# Patient Record
Sex: Female | Born: 1935
Health system: Southern US, Community
[De-identification: ages and names within clinical notes are randomized; demographics above are authoritative.]

## PROBLEM LIST (undated history)

## (undated) DIAGNOSIS — R112 Nausea with vomiting, unspecified: Secondary | ICD-10-CM

## (undated) DIAGNOSIS — E872 Acidosis: Secondary | ICD-10-CM

## (undated) DIAGNOSIS — M1990 Unspecified osteoarthritis, unspecified site: Secondary | ICD-10-CM

## (undated) DIAGNOSIS — N170 Acute kidney failure with tubular necrosis: Secondary | ICD-10-CM

## (undated) DIAGNOSIS — R011 Cardiac murmur, unspecified: Secondary | ICD-10-CM

## (undated) DIAGNOSIS — D649 Anemia, unspecified: Secondary | ICD-10-CM

## (undated) DIAGNOSIS — Z9889 Other specified postprocedural states: Secondary | ICD-10-CM

## (undated) DIAGNOSIS — N390 Urinary tract infection, site not specified: Secondary | ICD-10-CM

## (undated) DIAGNOSIS — R6520 Severe sepsis without septic shock: Secondary | ICD-10-CM

## (undated) DIAGNOSIS — I10 Essential (primary) hypertension: Secondary | ICD-10-CM

## (undated) DIAGNOSIS — N179 Acute kidney failure, unspecified: Secondary | ICD-10-CM

## (undated) DIAGNOSIS — R7303 Prediabetes: Secondary | ICD-10-CM

## (undated) DIAGNOSIS — Z95828 Presence of other vascular implants and grafts: Secondary | ICD-10-CM

## (undated) DIAGNOSIS — A419 Sepsis, unspecified organism: Secondary | ICD-10-CM

## (undated) DIAGNOSIS — H269 Unspecified cataract: Secondary | ICD-10-CM

## (undated) DIAGNOSIS — A401 Sepsis due to streptococcus, group B: Secondary | ICD-10-CM

## (undated) HISTORY — DX: Acute kidney failure with tubular necrosis: N17.0

## (undated) HISTORY — DX: Severe sepsis without septic shock: R65.20

## (undated) HISTORY — DX: Acidosis: E87.2

## (undated) HISTORY — PX: APPENDECTOMY: SHX54

## (undated) HISTORY — PX: RECTOCELE REPAIR: SHX761

## (undated) HISTORY — DX: Unspecified cataract: H26.9

## (undated) HISTORY — PX: ABDOMINAL HYSTERECTOMY: SHX81

## (undated) HISTORY — DX: Urinary tract infection, site not specified: N39.0

## (undated) HISTORY — DX: Cardiac murmur, unspecified: R01.1

## (undated) HISTORY — DX: Sepsis due to Streptococcus, group B: A40.1

## (undated) HISTORY — PX: EYE SURGERY: SHX253

## (undated) HISTORY — DX: Unspecified osteoarthritis, unspecified site: M19.90

## (undated) HISTORY — PX: JOINT REPLACEMENT: SHX530

## (undated) HISTORY — DX: Presence of other vascular implants and grafts: Z95.828

## (undated) HISTORY — DX: Acute kidney failure, unspecified: N17.9

## (undated) HISTORY — DX: Sepsis, unspecified organism: A41.9

---

## 2000-06-05 ENCOUNTER — Emergency Department (HOSPITAL_COMMUNITY): Admission: EM | Admit: 2000-06-05 | Discharge: 2000-06-05 | Payer: Self-pay | Admitting: Emergency Medicine

## 2008-06-14 ENCOUNTER — Emergency Department: Payer: Self-pay | Admitting: Emergency Medicine

## 2010-04-22 ENCOUNTER — Ambulatory Visit: Payer: Self-pay | Admitting: Ophthalmology

## 2010-05-10 ENCOUNTER — Ambulatory Visit: Payer: Self-pay | Admitting: Ophthalmology

## 2010-07-06 ENCOUNTER — Ambulatory Visit: Payer: Self-pay | Admitting: Ophthalmology

## 2010-07-18 ENCOUNTER — Ambulatory Visit: Payer: Self-pay | Admitting: Ophthalmology

## 2010-12-19 ENCOUNTER — Ambulatory Visit: Payer: Self-pay | Admitting: General Practice

## 2011-01-04 ENCOUNTER — Inpatient Hospital Stay: Payer: Self-pay | Admitting: General Practice

## 2011-01-06 DIAGNOSIS — R7989 Other specified abnormal findings of blood chemistry: Secondary | ICD-10-CM

## 2011-01-06 DIAGNOSIS — R000 Tachycardia, unspecified: Secondary | ICD-10-CM

## 2011-01-06 DIAGNOSIS — I059 Rheumatic mitral valve disease, unspecified: Secondary | ICD-10-CM

## 2011-01-07 DIAGNOSIS — I2699 Other pulmonary embolism without acute cor pulmonale: Secondary | ICD-10-CM

## 2011-01-13 ENCOUNTER — Emergency Department (HOSPITAL_COMMUNITY)
Admission: EM | Admit: 2011-01-13 | Discharge: 2011-01-13 | Disposition: A | Discharge status: Home / Self Care | Payer: Medicare Other | Attending: Emergency Medicine | Admitting: Emergency Medicine

## 2011-01-13 DIAGNOSIS — D62 Acute posthemorrhagic anemia: Secondary | ICD-10-CM | POA: Insufficient documentation

## 2011-01-13 DIAGNOSIS — M129 Arthropathy, unspecified: Secondary | ICD-10-CM | POA: Insufficient documentation

## 2011-01-13 DIAGNOSIS — I10 Essential (primary) hypertension: Secondary | ICD-10-CM | POA: Insufficient documentation

## 2011-01-13 DIAGNOSIS — Z9889 Other specified postprocedural states: Secondary | ICD-10-CM | POA: Insufficient documentation

## 2011-01-13 DIAGNOSIS — Z86711 Personal history of pulmonary embolism: Secondary | ICD-10-CM | POA: Insufficient documentation

## 2011-01-13 LAB — DIFFERENTIAL
Basophils Relative: 0 % (ref 0–1)
Lymphs Abs: 1.8 10*3/uL (ref 0.7–4.0)
Monocytes Relative: 10 % (ref 3–12)
Neutro Abs: 7 10*3/uL (ref 1.7–7.7)
Neutrophils Relative %: 70 % (ref 43–77)

## 2011-01-13 LAB — BASIC METABOLIC PANEL
CO2: 27 mEq/L (ref 19–32)
Calcium: 8.2 mg/dL — ABNORMAL LOW (ref 8.4–10.5)
GFR calc Af Amer: >60 mL/min (ref >60)
GFR calc non Af Amer: >60 mL/min (ref >60)
Sodium: 135 mEq/L (ref 135–145)

## 2011-01-13 LAB — URINALYSIS, ROUTINE W REFLEX MICROSCOPIC
Hgb urine dipstick: NEGATIVE
Nitrite: NEGATIVE
Specific Gravity, Urine: 1.012 (ref 1.005–1.030)
Urobilinogen, UA: 0.2 mg/dL (ref 0.0–1.0)

## 2011-01-13 LAB — CBC
Hemoglobin: 7.4 g/dL — ABNORMAL LOW (ref 12.0–15.0)
MCH: 28.8 pg (ref 26.0–34.0)
RBC: 2.57 MIL/uL — ABNORMAL LOW (ref 3.87–5.11)

## 2011-01-13 LAB — PROTIME-INR: Prothrombin Time: 22.1 seconds — ABNORMAL HIGH (ref 11.6–15.2)

## 2011-01-13 LAB — URINE MICROSCOPIC-ADD ON

## 2011-01-15 LAB — URINE CULTURE
Colony Count: NO GROWTH
Culture  Setup Time: 201203240046
Culture: NO GROWTH

## 2011-01-23 ENCOUNTER — Ambulatory Visit: Payer: Medicare Other | Attending: Orthopedic Surgery | Admitting: Physical Therapy

## 2011-01-23 DIAGNOSIS — Z96659 Presence of unspecified artificial knee joint: Secondary | ICD-10-CM | POA: Insufficient documentation

## 2011-01-23 DIAGNOSIS — M25669 Stiffness of unspecified knee, not elsewhere classified: Secondary | ICD-10-CM | POA: Insufficient documentation

## 2011-01-23 DIAGNOSIS — IMO0001 Reserved for inherently not codable concepts without codable children: Secondary | ICD-10-CM | POA: Insufficient documentation

## 2011-01-23 DIAGNOSIS — M25569 Pain in unspecified knee: Secondary | ICD-10-CM | POA: Insufficient documentation

## 2011-01-25 ENCOUNTER — Ambulatory Visit: Payer: Medicare Other | Admitting: Rehabilitation

## 2011-01-31 ENCOUNTER — Ambulatory Visit: Payer: Medicare Other | Admitting: Physical Therapy

## 2011-02-02 ENCOUNTER — Ambulatory Visit: Payer: Medicare Other | Admitting: Physical Therapy

## 2011-02-07 ENCOUNTER — Ambulatory Visit: Payer: Medicare Other | Admitting: Physical Therapy

## 2011-02-09 ENCOUNTER — Ambulatory Visit: Payer: Medicare Other | Admitting: Physical Therapy

## 2011-02-14 ENCOUNTER — Ambulatory Visit: Payer: Medicare Other | Admitting: Physical Therapy

## 2011-02-16 ENCOUNTER — Ambulatory Visit: Payer: Medicare Other | Admitting: Physical Therapy

## 2011-02-21 ENCOUNTER — Ambulatory Visit: Payer: Medicare Other | Attending: Orthopedic Surgery | Admitting: Physical Therapy

## 2011-02-21 DIAGNOSIS — M25569 Pain in unspecified knee: Secondary | ICD-10-CM | POA: Insufficient documentation

## 2011-02-21 DIAGNOSIS — IMO0001 Reserved for inherently not codable concepts without codable children: Secondary | ICD-10-CM | POA: Insufficient documentation

## 2011-02-21 DIAGNOSIS — M25669 Stiffness of unspecified knee, not elsewhere classified: Secondary | ICD-10-CM | POA: Insufficient documentation

## 2011-02-21 DIAGNOSIS — Z96659 Presence of unspecified artificial knee joint: Secondary | ICD-10-CM | POA: Insufficient documentation

## 2011-02-28 ENCOUNTER — Ambulatory Visit: Payer: Medicare Other | Admitting: Physical Therapy

## 2011-03-02 ENCOUNTER — Ambulatory Visit: Payer: Medicare Other | Admitting: Physical Therapy

## 2011-03-07 ENCOUNTER — Ambulatory Visit: Payer: Medicare Other | Admitting: Physical Therapy

## 2011-03-09 ENCOUNTER — Ambulatory Visit: Payer: Medicare Other | Admitting: Physical Therapy

## 2011-03-14 ENCOUNTER — Ambulatory Visit: Payer: Medicare Other | Admitting: Physical Therapy

## 2011-03-16 ENCOUNTER — Ambulatory Visit: Payer: Medicare Other | Admitting: Physical Therapy

## 2011-03-21 ENCOUNTER — Ambulatory Visit: Payer: Medicare Other | Admitting: Physical Therapy

## 2011-03-23 ENCOUNTER — Ambulatory Visit: Payer: Medicare Other | Admitting: Physical Therapy

## 2011-10-20 ENCOUNTER — Emergency Department: Payer: Self-pay | Admitting: Emergency Medicine

## 2012-02-15 ENCOUNTER — Emergency Department: Payer: Self-pay | Admitting: Internal Medicine

## 2012-02-15 DIAGNOSIS — S0003XA Contusion of scalp, initial encounter: Secondary | ICD-10-CM | POA: Diagnosis not present

## 2012-02-15 DIAGNOSIS — S0083XA Contusion of other part of head, initial encounter: Secondary | ICD-10-CM | POA: Diagnosis not present

## 2012-02-15 DIAGNOSIS — S0990XA Unspecified injury of head, initial encounter: Secondary | ICD-10-CM | POA: Diagnosis not present

## 2012-02-15 DIAGNOSIS — Z7982 Long term (current) use of aspirin: Secondary | ICD-10-CM | POA: Diagnosis not present

## 2012-02-15 DIAGNOSIS — T148XXA Other injury of unspecified body region, initial encounter: Secondary | ICD-10-CM | POA: Diagnosis not present

## 2012-02-15 DIAGNOSIS — Z90710 Acquired absence of both cervix and uterus: Secondary | ICD-10-CM | POA: Diagnosis not present

## 2012-02-15 DIAGNOSIS — S0180XA Unspecified open wound of other part of head, initial encounter: Secondary | ICD-10-CM | POA: Diagnosis not present

## 2012-02-15 DIAGNOSIS — M25569 Pain in unspecified knee: Secondary | ICD-10-CM | POA: Diagnosis not present

## 2012-02-22 ENCOUNTER — Emergency Department: Payer: Self-pay | Admitting: Internal Medicine

## 2012-03-08 DIAGNOSIS — H2510 Age-related nuclear cataract, unspecified eye: Secondary | ICD-10-CM | POA: Diagnosis not present

## 2012-03-08 DIAGNOSIS — Z961 Presence of intraocular lens: Secondary | ICD-10-CM | POA: Diagnosis not present

## 2012-07-26 ENCOUNTER — Emergency Department: Payer: Self-pay | Admitting: Emergency Medicine

## 2012-09-05 DIAGNOSIS — E119 Type 2 diabetes mellitus without complications: Secondary | ICD-10-CM | POA: Diagnosis not present

## 2012-09-05 DIAGNOSIS — I10 Essential (primary) hypertension: Secondary | ICD-10-CM | POA: Diagnosis not present

## 2013-01-11 ENCOUNTER — Ambulatory Visit: Payer: Medicare Other

## 2013-01-11 ENCOUNTER — Ambulatory Visit (INDEPENDENT_AMBULATORY_CARE_PROVIDER_SITE_OTHER): Payer: Medicare Other | Admitting: Family Medicine

## 2013-01-11 VITALS — BP 178/66 | HR 69 | Temp 98.9°F | Resp 16 | Ht 59.75 in | Wt 175.8 lb

## 2013-01-11 DIAGNOSIS — R05 Cough: Secondary | ICD-10-CM

## 2013-01-11 DIAGNOSIS — I517 Cardiomegaly: Secondary | ICD-10-CM

## 2013-01-11 DIAGNOSIS — R059 Cough, unspecified: Secondary | ICD-10-CM

## 2013-01-11 DIAGNOSIS — R062 Wheezing: Secondary | ICD-10-CM

## 2013-01-11 DIAGNOSIS — R351 Nocturia: Secondary | ICD-10-CM

## 2013-01-11 LAB — POCT CBC
HCT, POC: 38.6 % (ref 37.7–47.9)
Hemoglobin: 12 g/dL — AB (ref 12.2–16.2)
Lymph, poc: 2.6 (ref 0.6–3.4)
MCH, POC: 28.5 pg (ref 27–31.2)
MCHC: 31.1 g/dL — AB (ref 31.8–35.4)
RBC: 4.21 M/uL (ref 4.04–5.48)
WBC: 10.5 10*3/uL — AB (ref 4.6–10.2)

## 2013-01-11 LAB — GLUCOSE, POCT (MANUAL RESULT ENTRY): POC Glucose: 108 mg/dl — AB (ref 70–99)

## 2013-01-11 MED ORDER — PROMETHAZINE HCL 12.5 MG PO TABS: 12.5000 mg | ORAL_TABLET | Freq: Three times a day (TID) | ORAL | Status: DC | PRN

## 2013-01-11 MED ORDER — CEFDINIR 300 MG PO CAPS: 300.0000 mg | ORAL_CAPSULE | Freq: Two times a day (BID) | ORAL | Status: DC

## 2013-01-11 MED ORDER — PREDNISONE 20 MG PO TABS: ORAL_TABLET | ORAL | Status: DC

## 2013-01-11 MED ORDER — ALBUTEROL SULFATE HFA 108 (90 BASE) MCG/ACT IN AERS: 2.0000 | INHALATION_SPRAY | Freq: Four times a day (QID) | RESPIRATORY_TRACT | Status: DC | PRN

## 2013-01-11 NOTE — Patient Instructions (Addendum)
Drink plenty of fluids  Take medications as ordered  Return in 5-7 days to get your labs rechecked here or at your primary care

## 2013-01-11 NOTE — Progress Notes (Signed)
Subjective: 79 year old lady who is here the cause of a cough. This started with a migraine headache the day before the coughing started. She is continued to cough a lot day and night. She is not coughing up much stuff. She has been wheezing. She does not smoke. She lives alone. She does not go to the doctor very often she does have her regular physician. Has not been seeing any physician regularly although she does have one. She is functional, lives alone except for driving. Been having chest pain or edema. He has been told that she was a borderline diabetic.  Objective: Pleasant elderly alert lady in no major distress but she does cough. Her TMs are normal on the right, moderate cerumen on the left. Throat is clear. Neck soft without. Chest has scattered rhonchi and wheezes, more on the right than the left. Blood pressure elevation is noted. Repeat showed 178/66 no ankle edema  Assessment: Asthmatic bronchitis Hypertension  Plan: Chest x-ray and CBC  [facility] reading (PRIMARY) by  Dr. Alwyn Ren Cardiomegaly   Results for orders placed in visit on 01/11/13  POCT CBC      Result Value Range   WBC 10.5 (*) 4.6 - 10.2 K/uL   Lymph, poc 2.6  0.6 - 3.4   POC LYMPH PERCENT 24.5  10 - 50 %L   MID (cbc) 0.7  0 - 0.9   POC MID % 6.7  0 - 12 %M   POC Granulocyte 7.2 (*) 2 - 6.9   Granulocyte percent 68.8  37 - 80 %G   RBC 4.21  4.04 - 5.48 M/uL   Hemoglobin 12.0 (*) 12.2 - 16.2 g/dL   HCT, POC 03.4  74.2 - 47.9 %   MCV 91.8  80 - 97 fL   MCH, POC 28.5  27 - 31.2 pg   MCHC 31.1 (*) 31.8 - 35.4 g/dL   RDW, POC 59.5     Platelet Count, POC 305  142 - 424 K/uL   MPV 9.2  0 - 99.8 fL  GLUCOSE, POCT (MANUAL RESULT ENTRY)      Result Value Range   POC Glucose 108 (*) 70 - 99 mg/dl   Assessment: Asthmatic bronchitis Hypertension unsatisfactory control Cardiomegaly without obvious CHF  Plan: Explained to her that she needs to followup with regard to the blood pressure. Also has to see a  cardiologist with regard to the cardiomegaly. She can either return here next week or 2 her primary care if the labs rechecked

## 2013-01-14 ENCOUNTER — Ambulatory Visit: Payer: Medicare Other | Admitting: Cardiovascular Disease

## 2013-01-15 ENCOUNTER — Ambulatory Visit (INDEPENDENT_AMBULATORY_CARE_PROVIDER_SITE_OTHER): Payer: Medicare Other | Admitting: Family Medicine

## 2013-01-15 VITALS — BP 150/60 | HR 67 | Temp 98.8°F | Resp 18 | Ht 59.75 in | Wt 177.8 lb

## 2013-01-15 DIAGNOSIS — J45909 Unspecified asthma, uncomplicated: Secondary | ICD-10-CM

## 2013-01-15 NOTE — Patient Instructions (Signed)
Use the inhaler on an as-needed basis.  Return if worse

## 2013-01-15 NOTE — Progress Notes (Signed)
Subjective: Patient is feeling much better. She's not congested. She's not coughing. She has a long history of early morning nasal congestion, but is no worse than usual at this time. She is not wheezing. She's able to sleep through the night we will. She still has one day of prednisone and several days of antibiotics left.  Objective: Pleasant alert lady in no major distress. TMs normal. Throat clear. Neck supple without nodes. Chest clear. Heart regular without murmurs.  Assessment: Asthmatic bronchitis, improved Cardiomegaly  Plan: She is going to try again at [facility] cardiology. The people where we referred her to were not very pleasant about rescheduling things, so she prefers to go to [place].  She is to finish out her current medications, then just use the inhaler on an as-needed basis. Return if worse.

## 2013-01-30 DIAGNOSIS — Z0389 Encounter for observation for other suspected diseases and conditions ruled out: Secondary | ICD-10-CM | POA: Diagnosis not present

## 2013-01-30 DIAGNOSIS — I10 Essential (primary) hypertension: Secondary | ICD-10-CM | POA: Diagnosis not present

## 2013-01-30 DIAGNOSIS — I517 Cardiomegaly: Secondary | ICD-10-CM | POA: Diagnosis not present

## 2013-02-14 DIAGNOSIS — Z79899 Other long term (current) drug therapy: Secondary | ICD-10-CM | POA: Diagnosis not present

## 2013-02-14 DIAGNOSIS — Z0389 Encounter for observation for other suspected diseases and conditions ruled out: Secondary | ICD-10-CM | POA: Diagnosis not present

## 2013-02-14 DIAGNOSIS — I10 Essential (primary) hypertension: Secondary | ICD-10-CM | POA: Diagnosis not present

## 2013-02-14 DIAGNOSIS — I517 Cardiomegaly: Secondary | ICD-10-CM | POA: Diagnosis not present

## 2013-03-13 DIAGNOSIS — I10 Essential (primary) hypertension: Secondary | ICD-10-CM | POA: Diagnosis not present

## 2013-03-13 DIAGNOSIS — I359 Nonrheumatic aortic valve disorder, unspecified: Secondary | ICD-10-CM | POA: Diagnosis not present

## 2013-09-11 ENCOUNTER — Ambulatory Visit: Payer: Medicare Other | Admitting: Cardiology

## 2014-01-27 ENCOUNTER — Other Ambulatory Visit: Payer: Self-pay | Admitting: Cardiology

## 2014-02-16 ENCOUNTER — Other Ambulatory Visit (HOSPITAL_COMMUNITY): Payer: Self-pay | Admitting: Radiology

## 2014-02-16 ENCOUNTER — Ambulatory Visit (HOSPITAL_COMMUNITY): Payer: Medicare Other | Attending: Internal Medicine | Admitting: Radiology

## 2014-02-16 ENCOUNTER — Encounter (INDEPENDENT_AMBULATORY_CARE_PROVIDER_SITE_OTHER): Payer: Self-pay

## 2014-02-16 ENCOUNTER — Encounter: Payer: Self-pay | Admitting: Internal Medicine

## 2014-02-16 DIAGNOSIS — R609 Edema, unspecified: Secondary | ICD-10-CM | POA: Diagnosis not present

## 2014-02-16 DIAGNOSIS — R0609 Other forms of dyspnea: Secondary | ICD-10-CM | POA: Insufficient documentation

## 2014-02-16 DIAGNOSIS — R0989 Other specified symptoms and signs involving the circulatory and respiratory systems: Secondary | ICD-10-CM | POA: Diagnosis not present

## 2014-02-16 DIAGNOSIS — R011 Cardiac murmur, unspecified: Secondary | ICD-10-CM

## 2014-02-16 NOTE — Progress Notes (Signed)
Echocardiogram performed.  

## 2014-02-17 ENCOUNTER — Other Ambulatory Visit: Payer: Self-pay | Admitting: General Surgery

## 2014-02-17 DIAGNOSIS — I351 Nonrheumatic aortic (valve) insufficiency: Secondary | ICD-10-CM

## 2014-02-23 ENCOUNTER — Other Ambulatory Visit: Payer: Self-pay | Admitting: Cardiology

## 2014-03-04 DIAGNOSIS — H35359 Cystoid macular degeneration, unspecified eye: Secondary | ICD-10-CM | POA: Diagnosis not present

## 2014-03-06 DIAGNOSIS — H43819 Vitreous degeneration, unspecified eye: Secondary | ICD-10-CM | POA: Diagnosis not present

## 2014-03-11 DIAGNOSIS — H35059 Retinal neovascularization, unspecified, unspecified eye: Secondary | ICD-10-CM | POA: Diagnosis not present

## 2014-03-22 ENCOUNTER — Emergency Department (HOSPITAL_COMMUNITY): Payer: Medicare Other

## 2014-03-22 ENCOUNTER — Inpatient Hospital Stay (HOSPITAL_COMMUNITY)
Admission: EM | Admit: 2014-03-22 | Discharge: 2014-03-26 | DRG: 871 | Disposition: A | Discharge status: Home Health | Payer: Medicare Other | Attending: Internal Medicine | Admitting: Internal Medicine

## 2014-03-22 ENCOUNTER — Encounter (HOSPITAL_COMMUNITY): Payer: Self-pay | Admitting: *Deleted

## 2014-03-22 DIAGNOSIS — A409 Streptococcal sepsis, unspecified: Principal | ICD-10-CM | POA: Diagnosis present

## 2014-03-22 DIAGNOSIS — Z79899 Other long term (current) drug therapy: Secondary | ICD-10-CM | POA: Diagnosis not present

## 2014-03-22 DIAGNOSIS — R7881 Bacteremia: Secondary | ICD-10-CM

## 2014-03-22 DIAGNOSIS — E860 Dehydration: Secondary | ICD-10-CM | POA: Diagnosis present

## 2014-03-22 DIAGNOSIS — L02419 Cutaneous abscess of limb, unspecified: Secondary | ICD-10-CM | POA: Diagnosis present

## 2014-03-22 DIAGNOSIS — R011 Cardiac murmur, unspecified: Secondary | ICD-10-CM | POA: Diagnosis present

## 2014-03-22 DIAGNOSIS — A419 Sepsis, unspecified organism: Secondary | ICD-10-CM | POA: Diagnosis not present

## 2014-03-22 DIAGNOSIS — E872 Acidosis, unspecified: Secondary | ICD-10-CM | POA: Diagnosis present

## 2014-03-22 DIAGNOSIS — R6520 Severe sepsis without septic shock: Secondary | ICD-10-CM | POA: Diagnosis present

## 2014-03-22 DIAGNOSIS — R531 Weakness: Secondary | ICD-10-CM

## 2014-03-22 DIAGNOSIS — R5383 Other fatigue: Secondary | ICD-10-CM | POA: Diagnosis present

## 2014-03-22 DIAGNOSIS — N170 Acute kidney failure with tubular necrosis: Secondary | ICD-10-CM | POA: Diagnosis present

## 2014-03-22 DIAGNOSIS — R5381 Other malaise: Secondary | ICD-10-CM | POA: Diagnosis present

## 2014-03-22 DIAGNOSIS — N390 Urinary tract infection, site not specified: Secondary | ICD-10-CM

## 2014-03-22 DIAGNOSIS — R4182 Altered mental status, unspecified: Secondary | ICD-10-CM | POA: Diagnosis present

## 2014-03-22 DIAGNOSIS — Z880 Allergy status to penicillin: Secondary | ICD-10-CM

## 2014-03-22 DIAGNOSIS — M129 Arthropathy, unspecified: Secondary | ICD-10-CM | POA: Diagnosis present

## 2014-03-22 DIAGNOSIS — N179 Acute kidney failure, unspecified: Secondary | ICD-10-CM

## 2014-03-22 DIAGNOSIS — D72829 Elevated white blood cell count, unspecified: Secondary | ICD-10-CM

## 2014-03-22 DIAGNOSIS — L03115 Cellulitis of right lower limb: Secondary | ICD-10-CM

## 2014-03-22 DIAGNOSIS — E876 Hypokalemia: Secondary | ICD-10-CM | POA: Diagnosis present

## 2014-03-22 DIAGNOSIS — L03119 Cellulitis of unspecified part of limb: Secondary | ICD-10-CM

## 2014-03-22 DIAGNOSIS — F05 Delirium due to known physiological condition: Secondary | ICD-10-CM | POA: Diagnosis present

## 2014-03-22 DIAGNOSIS — Z7982 Long term (current) use of aspirin: Secondary | ICD-10-CM | POA: Diagnosis not present

## 2014-03-22 DIAGNOSIS — M25569 Pain in unspecified knee: Secondary | ICD-10-CM | POA: Diagnosis not present

## 2014-03-22 DIAGNOSIS — D649 Anemia, unspecified: Secondary | ICD-10-CM | POA: Diagnosis present

## 2014-03-22 DIAGNOSIS — R509 Fever, unspecified: Secondary | ICD-10-CM

## 2014-03-22 DIAGNOSIS — R6889 Other general symptoms and signs: Secondary | ICD-10-CM | POA: Diagnosis not present

## 2014-03-22 HISTORY — DX: Acidosis: E87.2

## 2014-03-22 HISTORY — DX: Sepsis, unspecified organism: A41.9

## 2014-03-22 HISTORY — DX: Acute kidney failure, unspecified: N17.9

## 2014-03-22 HISTORY — DX: Urinary tract infection, site not specified: N39.0

## 2014-03-22 HISTORY — DX: Acute kidney failure with tubular necrosis: N17.0

## 2014-03-22 HISTORY — DX: Acidosis, unspecified: E87.20

## 2014-03-22 LAB — CK TOTAL AND CKMB (NOT AT ARMC)
CK, MB: 3.3 ng/mL (ref 0.3–4.0)
Relative Index: 0.4 (ref 0.0–2.5)
Total CK: 881 U/L — ABNORMAL HIGH (ref 7–177)

## 2014-03-22 LAB — CBC
HCT: 33.3 % — ABNORMAL LOW (ref 36.0–46.0)
HEMOGLOBIN: 11.4 g/dL — AB (ref 12.0–15.0)
MCH: 30.2 pg (ref 26.0–34.0)
MCHC: 34.2 g/dL (ref 30.0–36.0)
MCV: 88.1 fL (ref 78.0–100.0)
Platelets: 199 10*3/uL (ref 150–400)
RBC: 3.78 MIL/uL — ABNORMAL LOW (ref 3.87–5.11)
RDW: 13.8 % (ref 11.5–15.5)
WBC: 22.4 10*3/uL — ABNORMAL HIGH (ref 4.0–10.5)

## 2014-03-22 LAB — COMPREHENSIVE METABOLIC PANEL
ALT: 28 U/L (ref 0–35)
AST: 45 U/L — ABNORMAL HIGH (ref 0–37)
Albumin: 3.4 g/dL — ABNORMAL LOW (ref 3.5–5.2)
Alkaline Phosphatase: 55 U/L (ref 39–117)
BUN: 35 mg/dL — ABNORMAL HIGH (ref 6–23)
CO2: 19 mEq/L (ref 19–32)
CREATININE: 1.22 mg/dL — AB (ref 0.50–1.10)
Calcium: 8.7 mg/dL (ref 8.4–10.5)
Chloride: 95 mEq/L — ABNORMAL LOW (ref 96–112)
GFR calc non Af Amer: 40 mL/min — ABNORMAL LOW (ref >90)
GFR, EST AFRICAN AMERICAN: 46 mL/min — AB (ref >90)
GLUCOSE: 181 mg/dL — AB (ref 70–99)
Potassium: 3.5 mEq/L — ABNORMAL LOW (ref 3.7–5.3)
Sodium: 132 mEq/L — ABNORMAL LOW (ref 137–147)
Total Bilirubin: 0.5 mg/dL (ref 0.3–1.2)
Total Protein: 7 g/dL (ref 6.0–8.3)

## 2014-03-22 LAB — URINE MICROSCOPIC-ADD ON

## 2014-03-22 LAB — URINALYSIS, ROUTINE W REFLEX MICROSCOPIC
Glucose, UA: NEGATIVE mg/dL
KETONES UR: NEGATIVE mg/dL
NITRITE: NEGATIVE
PH: 5 (ref 5.0–8.0)
PROTEIN: 100 mg/dL — AB
Specific Gravity, Urine: 1.024 (ref 1.005–1.030)
Urobilinogen, UA: 0.2 mg/dL (ref 0.0–1.0)

## 2014-03-22 LAB — I-STAT CG4 LACTIC ACID, ED: LACTIC ACID, VENOUS: 4.81 mmol/L — AB (ref 0.5–2.2)

## 2014-03-22 MED ORDER — SODIUM CHLORIDE 0.9 % IJ SOLN
3.0000 mL | Freq: Two times a day (BID) | INTRAMUSCULAR | Status: DC
  Administered 2014-03-23 – 2014-03-26 (×5): 3 mL via INTRAVENOUS

## 2014-03-22 MED ORDER — HEPARIN SODIUM (PORCINE) 5000 UNIT/ML IJ SOLN
5000.0000 [IU] | Freq: Three times a day (TID) | INTRAMUSCULAR | Status: DC
  Administered 2014-03-22 – 2014-03-26 (×11): 5000 [IU] via SUBCUTANEOUS
  Filled 2014-03-22 (×15): qty 1

## 2014-03-22 MED ORDER — LEVOFLOXACIN IN D5W 750 MG/150ML IV SOLN: 750.0000 mg | INTRAVENOUS | Status: DC

## 2014-03-22 MED ORDER — ACETAMINOPHEN 500 MG PO TABS
1000.0000 mg | ORAL_TABLET | Freq: Four times a day (QID) | ORAL | Status: DC | PRN
  Administered 2014-03-23 – 2014-03-24 (×4): 1000 mg via ORAL
  Filled 2014-03-22 (×4): qty 2

## 2014-03-22 MED ORDER — SODIUM CHLORIDE 0.9 % IV SOLN: INTRAVENOUS | Status: DC

## 2014-03-22 MED ORDER — VANCOMYCIN HCL IN DEXTROSE 1-5 GM/200ML-% IV SOLN
1000.0000 mg | Freq: Once | INTRAVENOUS | Status: AC
  Administered 2014-03-22: 1000 mg via INTRAVENOUS
  Filled 2014-03-22: qty 200

## 2014-03-22 MED ORDER — ASPIRIN EC 81 MG PO TBEC
81.0000 mg | DELAYED_RELEASE_TABLET | Freq: Every day | ORAL | Status: DC
  Administered 2014-03-23 – 2014-03-26 (×4): 81 mg via ORAL
  Filled 2014-03-22 (×4): qty 1

## 2014-03-22 MED ORDER — SODIUM CHLORIDE 0.9 % IV BOLUS (SEPSIS)
500.0000 mL | Freq: Once | INTRAVENOUS | Status: AC
Start: 2014-03-22 — End: 2014-03-22
  Administered 2014-03-22: 500 mL via INTRAVENOUS

## 2014-03-22 MED ORDER — LEVOFLOXACIN IN D5W 750 MG/150ML IV SOLN
750.0000 mg | Freq: Once | INTRAVENOUS | Status: AC
  Administered 2014-03-22: 750 mg via INTRAVENOUS
  Filled 2014-03-22 (×2): qty 150

## 2014-03-22 MED ORDER — SODIUM CHLORIDE 0.9 % IV BOLUS (SEPSIS)
1000.0000 mL | Freq: Once | INTRAVENOUS | Status: AC
  Administered 2014-03-22: 1000 mL via INTRAVENOUS

## 2014-03-22 MED ORDER — VANCOMYCIN HCL 10 G IV SOLR: 1250.0000 mg | INTRAVENOUS | Status: DC

## 2014-03-22 MED ORDER — SODIUM CHLORIDE 0.9 % IV SOLN
INTRAVENOUS | Status: DC
  Administered 2014-03-22 – 2014-03-24 (×3): via INTRAVENOUS

## 2014-03-22 NOTE — Progress Notes (Signed)
ANTIBIOTIC CONSULT NOTE - INITIAL  Pharmacy Consult for Vancomycin & Levaquin Indication: R/O Sepsis  Allergies  Allergen Reactions  . Ampicillin Hives and Itching  . Penicillins Hives and Itching    Patient Measurements: Height: 5' (152.4 cm) Weight: 177 lb (80.287 kg) IBW/kg (Calculated) : 45.5  Vital Signs: Temp: 104.8 F (40.4 C) (05/31 1944) Temp src: Rectal (05/31 1944) BP: 139/57 mmHg (05/31 1944) Pulse Rate: 97 (05/31 1944) Intake/Output from previous day:   Intake/Output from this shift:    Labs:  Recent Labs  03/22/14 1950  WBC 22.4*  HGB 11.4*  PLT 199  CREATININE 1.22*   Estimated Creatinine Clearance: 33.3 ml/min (by C-G formula based on Cr of 1.22). No results found for this basename: VANCOTROUGH, VANCOPEAK, VANCORANDOM, GENTTROUGH, GENTPEAK, GENTRANDOM, TOBRATROUGH, TOBRAPEAK, TOBRARND, AMIKACINPEAK, AMIKACINTROU, AMIKACIN,  in the last 72 hours   Microbiology: No results found for this or any previous visit (from the past 720 hour(s)).  Medical History: Past Medical History  Diagnosis Date  . Arthritis   . Cataract   . Heart murmur     Medications:  Scheduled:  . aspirin EC  81 mg Oral Daily   Infusions:  . sodium chloride    . levofloxacin (LEVAQUIN) IV 750 mg (03/22/14 2042)  . [START ON 03/24/2014] levofloxacin (LEVAQUIN) IV    . sodium chloride    . [START ON 03/23/2014] vancomycin    . vancomycin 1,000 mg (03/22/14 2027)   Assessment:  80 yr female s/p fall found per EMS with temp, feces on feet and urine odor in home  Vancomycin 1gm given in Ed @ 20:27 and Levaquin 750mg  given in ED @ 20:42  Vancomycin and Levaquin ordered per pharmacy dosing for sepsis  Tm = 104.7F, WBC 22.4  Scr = 1.22 with CrCl ~ 33 ml/min  Blood and urine cultures ordered  Goal of Therapy:  Vancomycin trough level 15-20 mcg/ml  Plan:  Measure antibiotic drug levels at steady state Follow up culture results Levaquin 750mg  IV q48h Vancomycin  1250mg  IV q24h  Everette Rank, PharmD 03/22/2014,9:00 PM

## 2014-03-22 NOTE — H&P (Signed)
[facility] History and Physical  Dawn Lawson PZW:[id number] DOB: 06-Jul-1929 DOA: 03/22/2014  Referring physician: EDP PCP: No primary provider on file.   Chief Complaint: Fever   HPI: Dawn Lawson is a 80 y.o. female normally living alone, presents to ED via EMS with c/o fever.  Neighbors found patient generally weak and confused appearing.  Tried to help to bathroom but patient slid to floor.  Feces noted on feet.  Too weak to stand.  EMS called.  On EMS arrival patient states she "feels fine" and reports no complaints.  Despite her claims of "feeling fine" her work up argues otherwise.  She is running a 104.8 fever, has a WBC of 22k, has AKI with UA suggestive of ATN and UTI.  Her RLE is erythematous which family states was not present as of yesterday and the patient herself states is new when asked.  Review of Systems: Systems reviewed.  As above, otherwise negative  Past Medical History  Diagnosis Date  . Arthritis   . Cataract   . Heart murmur    Past Surgical History  Procedure Laterality Date  . Appendectomy    . Eye surgery    . Joint replacement    . Abdominal hysterectomy     Social History:  reports that she has never smoked. She does not have any smokeless tobacco history on file. She reports that she does not drink alcohol or use illicit drugs.  Allergies  Allergen Reactions  . Ampicillin Hives and Itching  . Penicillins Hives and Itching    No family history on file.   Prior to Admission medications   Medication Sig Start Date End Date Taking? Authorizing Provider  acetaminophen (TYLENOL) 650 MG CR tablet Take 1,300 mg by mouth every 8 (eight) hours as needed for pain.    Yes Historical Provider, MD  aspirin EC 81 MG tablet Take 81 mg by mouth daily.   Yes Historical Provider, MD  bisoprolol-hydrochlorothiazide (ZIAC) 2.5-6.25 MG per tablet Take 1 tablet by mouth daily.   Yes Historical Provider, MD  hydroxypropyl methylcellulose (ISOPTO TEARS)  2.5 % ophthalmic solution Place 1 drop into both eyes 3 (three) times daily as needed for dry eyes.   Yes Historical Provider, MD  PRESCRIPTION MEDICATION Inject 1 application as directed every 30 (thirty) days. Pt gets eyes injections through [facility]   Yes Historical Provider, MD  Pseudoephedrine-APAP-DM 30-325-15 MG CAPS Take 2 capsules by mouth every 6 (six) hours as needed (cough).   Yes Historical Provider, MD  Vitamin D, Ergocalciferol, (DRISDOL) 50000 UNITS CAPS capsule Take 50,000 Units by mouth every Thursday.   Yes Historical Provider, MD   Physical Exam: Filed Vitals:   03/22/14 2125  BP:   Pulse:   Temp: 99 F (37.2 C)  Resp:     BP 89/62  Pulse 78  Temp(Src) 99 F (37.2 C) (Oral)  Resp 23  Ht 5' (1.524 m)  Wt 80.287 kg (177 lb)  BMI 34.57 kg/m2  SpO2 94%  General Appearance:    Alert, oriented, no distress, appears stated age  Head:    Normocephalic, atraumatic  Eyes:    PERRL, EOMI, sclera non-icteric        Nose:   Nares without drainage or epistaxis. Mucosa, turbinates normal  Throat:   Moist mucous membranes. Oropharynx without erythema or exudate.  Neck:   Supple. No carotid bruits.  No thyromegaly.  No lymphadenopathy.   Back:     No CVA  tenderness, no spinal tenderness  Lungs:     Clear to auscultation bilaterally, without wheezes, rhonchi or rales  Chest wall:    No tenderness to palpitation  Heart:    Regular rate and rhythm without murmurs, gallops, rubs  Abdomen:     Soft, non-tender, nondistended, normal bowel sounds, no organomegaly  Genitalia:    deferred  Rectal:    deferred  Extremities:   R Knee has very limited ROM and pain with ROM but per patient and family this is a chronic baseline since the R TKA surgery and is not new today.   Pulses:   2+ and symmetric all extremities  Skin:   RLE is erythematous from foot to thigh, with warmth, not tender.   Lymph nodes:   Cervical, supraclavicular, and axillary nodes normal  Neurologic:    CNII-XII intact. Normal strength, sensation and reflexes      throughout    Labs on Admission:  Basic Metabolic Panel:  Recent Labs Lab 03/22/14 1950  NA 132*  K 3.5*  CL 95*  CO2 19  GLUCOSE 181*  BUN 35*  CREATININE 1.22*  CALCIUM 8.7   Liver Function Tests:  Recent Labs Lab 03/22/14 1950  AST 45*  ALT 28  ALKPHOS 55  BILITOT 0.5  PROT 7.0  ALBUMIN 3.4*   No results found for this basename: LIPASE, AMYLASE,  in the last 168 hours No results found for this basename: AMMONIA,  in the last 168 hours CBC:  Recent Labs Lab 03/22/14 1950  WBC 22.4*  HGB 11.4*  HCT 33.3*  MCV 88.1  PLT 199   Cardiac Enzymes: No results found for this basename: CKTOTAL, CKMB, CKMBINDEX, TROPONINI,  in the last 168 hours  BNP (last 3 results) No results found for this basename: PROBNP,  in the last 8760 hours CBG: No results found for this basename: GLUCAP,  in the last 168 hours  Radiological Exams on Admission: Dg Chest Port 1 View  (if Code Sepsis Called)  03/22/2014   CLINICAL DATA:  Fever  EXAM: PORTABLE CHEST - 1 VIEW  COMPARISON:  01/11/2013  FINDINGS: Normal cardiac silhouette. Exam is lordotic. No effusion, infiltrate, or pneumothorax. There are low lung volumes. Significant arthropathy of the shoulder joints.  IMPRESSION: No acute findings.  Low lung volumes.   Electronically Signed   By: Suzy Bouchard M.D.   On: 03/22/2014 20:08    EKG: Independently reviewed.  Assessment/Plan Principal Problem:   Severe sepsis with acute organ dysfunction Active Problems:   UTI (lower urinary tract infection)   AKI (acute kidney injury)   ATN (acute tubular necrosis)   Lactic acidosis   Urinary tract infection   Cellulitis of right leg   1. Severe sepsis with acute organ dysfunction - source appears to be cellulitis of R leg vs UTI, patient on empiric levaquin and vanc (PCN allergy), IVF, blood and urine cultures, tylenol for fever, trend leukocytosis, repeat CBC in AM.   Septic arthritis of R knee is also in the differential but all of the physical exam findings are baseline per family and cellulitis seems quite extensive and diffuse for a localized knee infection. 2. Lactic acidosis - rehydrate, repeat lactate at midnight 3. AKI with ATN - pre-renal, rehydrating patient, goal MAP > 65, holding all BP meds.  Repeat BMP in AM.    Code Status: Full  Family Communication: Son at bedside Disposition Plan: Admit to SDU   Time spent: 70 min  Etta Quill  [facility] Pager [phone]  If 7AM-7PM, please contact the day team taking care of the patient [email or web address] Password [facility] 03/22/2014, 9:29 PM

## 2014-03-22 NOTE — ED Provider Notes (Signed)
CSN: [id number]     Arrival date & time 03/22/14  1925 History   First MD Initiated Contact with Patient 03/22/14 1935     Chief Complaint  Patient presents with  . Fever     (Consider location/radiation/quality/duration/timing/severity/associated sxs/prior Treatment) Patient is a 80 y.o. female presenting with fever. The history is provided by the patient and the EMS personnel. The history is limited by the condition of the patient.  Fever Associated symptoms: confusion   Associated symptoms: no chest pain, no cough, no diarrhea, no dysuria, no headaches, no sore throat and no vomiting   pt had called neighbors regarding fall this evening, lives alone, neighbors found pt generally weak and confused appearing, tried to help to bathroom, but pt slid to floor. No trauma, was assisted to ground. Feces noted on feet. Too weak to stand. ems was called. Temp 102. ems noted strong odor urine. Pt alert on arrival to ED, states feels fine, is very poor and limited historian - level 5 caveat.      Past Medical History  Diagnosis Date  . Arthritis   . Cataract   . Heart murmur    Past Surgical History  Procedure Laterality Date  . Appendectomy    . Eye surgery    . Joint replacement    . Abdominal hysterectomy     No family history on file. History  Substance Use Topics  . Smoking status: Never Smoker   . Smokeless tobacco: Not on file  . Alcohol Use: No   OB History   Grav Para Term Preterm Abortions TAB SAB Ect Mult Living                 Review of Systems  Unable to perform ROS: Mental status change  Constitutional: Positive for fever.  HENT: Negative for sore throat.   Eyes: Negative for pain.  Respiratory: Negative for cough.   Cardiovascular: Negative for chest pain.  Gastrointestinal: Negative for vomiting and diarrhea.  Genitourinary: Negative for dysuria.  Musculoskeletal: Negative for back pain and neck pain.  Skin: Negative for wound.  Neurological: Negative for  headaches.  Psychiatric/Behavioral: Positive for confusion.  level 5 caveat - pt ?confused, denies any c/o - level 5 caveat.       Allergies  Review of patient's allergies indicates no known allergies.  Home Medications   Prior to Admission medications   Medication Sig Start Date End Date Taking? Authorizing Provider  acetaminophen (TYLENOL) 650 MG CR tablet Take 650 mg by mouth continuous as needed for pain.    Historical Provider, MD  albuterol (PROVENTIL HFA;VENTOLIN HFA) 108 (90 BASE) MCG/ACT inhaler Inhale 2 puffs into the lungs every 6 (six) hours as needed for wheezing. 01/11/13   Posey Boyer, MD  aspirin 81 MG tablet Take 81 mg by mouth daily.    Historical Provider, MD  bisoprolol-hydrochlorothiazide [facility]) 2.5-6.25 MG per tablet Take 1 tablet by mouth daily.    Historical Provider, MD  cefdinir (OMNICEF) 300 MG capsule Take 1 capsule (300 mg total) by mouth 2 (two) times daily. 01/11/13   Posey Boyer, MD  Cholecalciferol (VITAMIN D PO) Take 1.25 mg by mouth.    Historical Provider, MD  lisinopril (PRINIVIL,ZESTRIL) 10 MG tablet TAKE 1 TABLET BY MOUTH DAILY. 01/27/14   Sueanne Margarita, MD  predniSONE (DELTASONE) 20 MG tablet Take 3 daily for 2 days, then 2 daily for 2 days, then one daily for 2 days for asthmatic bronchitis 01/11/13  Posey Boyer, MD  promethazine (PHENERGAN) 12.5 MG tablet Take 1 tablet (12.5 mg total) by mouth every 8 (eight) hours as needed for nausea. 01/11/13   Posey Boyer, MD   BP 139/57  Pulse 97  Temp(Src) 104.8 F (40.4 C) (Rectal)  Resp 22  SpO2 94% Physical Exam  Nursing note and vitals reviewed. Constitutional:  Febrile, morbidly obese.   HENT:  Head: Atraumatic.  Nose: Nose normal.  Mouth/Throat: Oropharynx is clear and moist.  Eyes: Conjunctivae are normal. Pupils are equal, round, and reactive to light. No scleral icterus.  Neck: Neck supple. No tracheal deviation present.  No stiffness or rigidity  Cardiovascular: Normal rate,  normal heart sounds and intact distal pulses.  Exam reveals no gallop and no friction rub.   No murmur heard. Pulmonary/Chest: Effort normal. No respiratory distress.  Abdominal: Normal appearance and bowel sounds are normal. She exhibits no distension and no mass. There is no tenderness. There is no rebound and no guarding.  Genitourinary:  No cva tenderness  Musculoskeletal: She exhibits no edema.  No cva tenderness. Pt with erythema of left leg from distal thigh to ankle. No significant sts. Not appreciably tender. Mild increased warmth. Distal pulses palp. Good rom at right hip knee and ankle without exquisite pain.   Lymphadenopathy:    She has no cervical adenopathy.  Neurological: She is alert.  Alert, oriented to person/place. Moves bil ext purposefully, good strength, sens intact.   Skin: Skin is warm and dry. No rash noted.  Psychiatric: She has a normal mood and affect.    ED Course  Procedures (including critical care time) Labs Review  Results for orders placed during the hospital encounter of 03/22/14  URINALYSIS, ROUTINE W REFLEX MICROSCOPIC      Result Value Ref Range   Color, Urine YELLOW  YELLOW   APPearance TURBID (*) CLEAR   Specific Gravity, Urine 1.024  1.005 - 1.030   pH 5.0  5.0 - 8.0   Glucose, UA NEGATIVE  NEGATIVE mg/dL   Hgb urine dipstick LARGE (*) NEGATIVE   Bilirubin Urine SMALL (*) NEGATIVE   Ketones, ur NEGATIVE  NEGATIVE mg/dL   Protein, ur 100 (*) NEGATIVE mg/dL   Urobilinogen, UA 0.2  0.0 - 1.0 mg/dL   Nitrite NEGATIVE  NEGATIVE   Leukocytes, UA MODERATE (*) NEGATIVE  CBC      Result Value Ref Range   WBC 22.4 (*) 4.0 - 10.5 K/uL   RBC 3.78 (*) 3.87 - 5.11 MIL/uL   Hemoglobin 11.4 (*) 12.0 - 15.0 g/dL   HCT 33.3 (*) 36.0 - 46.0 %   MCV 88.1  78.0 - 100.0 fL   MCH 30.2  26.0 - 34.0 pg   MCHC 34.2  30.0 - 36.0 g/dL   RDW 13.8  11.5 - 15.5 %   Platelets 199  150 - 400 K/uL  COMPREHENSIVE METABOLIC PANEL      Result Value Ref Range    Sodium 132 (*) 137 - 147 mEq/L   Potassium 3.5 (*) 3.7 - 5.3 mEq/L   Chloride 95 (*) 96 - 112 mEq/L   CO2 19  19 - 32 mEq/L   Glucose, Bld 181 (*) 70 - 99 mg/dL   BUN 35 (*) 6 - 23 mg/dL   Creatinine, Ser 1.22 (*) 0.50 - 1.10 mg/dL   Calcium 8.7  8.4 - 10.5 mg/dL   Total Protein 7.0  6.0 - 8.3 g/dL   Albumin 3.4 (*) 3.5 - 5.2  g/dL   AST 45 (*) 0 - 37 U/L   ALT 28  0 - 35 U/L   Alkaline Phosphatase 55  39 - 117 U/L   Total Bilirubin 0.5  0.3 - 1.2 mg/dL   GFR calc non Af Amer 40 (*) >90 mL/min   GFR calc Af Amer 46 (*) >90 mL/min  URINE MICROSCOPIC-ADD ON      Result Value Ref Range   Squamous Epithelial / LPF RARE  RARE   WBC, UA TOO NUMEROUS TO COUNT  <3 WBC/hpf   Bacteria, UA MANY (*) RARE   Casts GRANULAR CAST (*) NEGATIVE   Urine-Other AMORPHOUS URATES/PHOSPHATES    I-STAT CG4 LACTIC ACID, ED      Result Value Ref Range   Lactic Acid, Venous 4.81 (*) 0.5 - 2.2 mmol/L   Dg Chest Port 1 View  (if Code Sepsis Called)  03/22/2014   CLINICAL DATA:  Fever  EXAM: PORTABLE CHEST - 1 VIEW  COMPARISON:  01/11/2013  FINDINGS: Normal cardiac silhouette. Exam is lordotic. No effusion, infiltrate, or pneumothorax. There are low lung volumes. Significant arthropathy of the shoulder joints.  IMPRESSION: No acute findings.  Low lung volumes.   Electronically Signed   By: Suzy Bouchard M.D.   On: 03/22/2014 20:08      MDM  Iv ns. Labs. Cultures. Cxr.  Reviewed nursing notes and prior charts for additional history.   Lactic acid high, ns boluses. bp normal on recheck.  After cultures sent, pt given iv abx, allergy to pcns, given levaquin and vanc iv.   Additional ns iv bolus.  ua positive, u cx being done.   Med service contacted for admission.  CRITICAL CARE  RE weakness, urosepsis, altered mental status, lactic acidosis, acute kidney injury, leucocytosis. Performed by: Mirna Mires Total critical care time: 44 Critical care time was exclusive of separately billable  procedures and treating other patients. Critical care was necessary to treat or prevent imminent or life-threatening deterioration. Critical care was time spent personally by me on the following activities: development of treatment plan with patient and/or surrogate as well as nursing, discussions with consultants, evaluation of patient's response to treatment, examination of patient, obtaining history from patient or surrogate, ordering and performing treatments and interventions, ordering and review of laboratory studies, ordering and review of radiographic studies, pulse oximetry and re-evaluation of patient's condition.     Mirna Mires, MD 03/22/14 2132

## 2014-03-22 NOTE — ED Notes (Signed)
Gave Lactic Acid to [place].

## 2014-03-22 NOTE — ED Notes (Signed)
Bed: RESA Expected date:  Expected time:  Means of arrival:  Comments: EMS  

## 2014-03-22 NOTE — ED Notes (Signed)
Family at bedside. 

## 2014-03-22 NOTE — ED Notes (Signed)
Patient brought visibly soiled with feces and smelled of urine. She states she lives alone and takes care of herself.

## 2014-03-22 NOTE — ED Notes (Signed)
Per EMS/states initial call was for a fall-pt lives by herself-pt had called neighbors to help her to get to the bathroom-states pt weak and slid to floor of bedroom/per EMS, states pt had feces on her feet, very weak and unable to stand-EMS gave 1 gram of tylenol for 101.6 temp prior to arrival.  EMS noted very strong smelling urine in house.

## 2014-03-23 LAB — BASIC METABOLIC PANEL
BUN: 26 mg/dL — ABNORMAL HIGH (ref 6–23)
CALCIUM: 8.1 mg/dL — AB (ref 8.4–10.5)
CO2: 22 meq/L (ref 19–32)
CREATININE: 0.92 mg/dL (ref 0.50–1.10)
Chloride: 102 mEq/L (ref 96–112)
GFR calc Af Amer: 65 mL/min — ABNORMAL LOW (ref >90)
GFR calc non Af Amer: 56 mL/min — ABNORMAL LOW (ref >90)
Glucose, Bld: 163 mg/dL — ABNORMAL HIGH (ref 70–99)
Potassium: 3 mEq/L — ABNORMAL LOW (ref 3.7–5.3)
Sodium: 137 mEq/L (ref 137–147)

## 2014-03-23 LAB — PROCALCITONIN: Procalcitonin: 38.76 ng/mL

## 2014-03-23 LAB — CBC
HCT: 31.5 % — ABNORMAL LOW (ref 36.0–46.0)
Hemoglobin: 10.6 g/dL — ABNORMAL LOW (ref 12.0–15.0)
MCH: 29.9 pg (ref 26.0–34.0)
MCHC: 33.7 g/dL (ref 30.0–36.0)
MCV: 89 fL (ref 78.0–100.0)
PLATELETS: 181 10*3/uL (ref 150–400)
RBC: 3.54 MIL/uL — AB (ref 3.87–5.11)
RDW: 13.8 % (ref 11.5–15.5)
WBC: 25.8 10*3/uL — ABNORMAL HIGH (ref 4.0–10.5)

## 2014-03-23 LAB — MAGNESIUM: Magnesium: 2.1 mg/dL (ref 1.5–2.5)

## 2014-03-23 LAB — MRSA PCR SCREENING: MRSA BY PCR: NEGATIVE

## 2014-03-23 LAB — LACTIC ACID, PLASMA: Lactic Acid, Venous: 1.5 mmol/L (ref 0.5–2.2)

## 2014-03-23 MED ORDER — PROMETHAZINE HCL 25 MG/ML IJ SOLN: 12.5000 mg | Freq: Once | INTRAMUSCULAR | Status: DC

## 2014-03-23 MED ORDER — OXYCODONE-ACETAMINOPHEN 5-325 MG PO TABS
2.0000 | ORAL_TABLET | Freq: Once | ORAL | Status: AC
  Administered 2014-03-23: 2 via ORAL
  Filled 2014-03-23: qty 2

## 2014-03-23 MED ORDER — POTASSIUM CHLORIDE 10 MEQ/100ML IV SOLN
10.0000 meq | INTRAVENOUS | Status: AC
  Administered 2014-03-23 (×2): 10 meq via INTRAVENOUS
  Filled 2014-03-23 (×2): qty 100

## 2014-03-23 MED ORDER — URELLE 81 MG PO TABS
1.0000 | ORAL_TABLET | Freq: Four times a day (QID) | ORAL | Status: DC | PRN
  Filled 2014-03-23: qty 1

## 2014-03-23 MED ORDER — VANCOMYCIN HCL IN DEXTROSE 750-5 MG/150ML-% IV SOLN
750.0000 mg | Freq: Two times a day (BID) | INTRAVENOUS | Status: DC
  Administered 2014-03-23 – 2014-03-25 (×5): 750 mg via INTRAVENOUS
  Filled 2014-03-23 (×6): qty 150

## 2014-03-23 MED ORDER — LEVOFLOXACIN IN D5W 750 MG/150ML IV SOLN
750.0000 mg | INTRAVENOUS | Status: DC
  Administered 2014-03-23 – 2014-03-25 (×3): 750 mg via INTRAVENOUS
  Filled 2014-03-23 (×4): qty 150

## 2014-03-23 NOTE — Progress Notes (Signed)
ANTIBIOTIC CONSULT NOTE - Follow Up  Pharmacy Consult for Vancomycin & Levaquin Indication: R/O Sepsis  Allergies  Allergen Reactions  . Ampicillin Hives and Itching  . Penicillins Hives and Itching    Patient Measurements: Height: 5' (152.4 cm) Weight: 195 lb 5.2 oz (88.6 kg) IBW/kg (Calculated) : 45.5  Vital Signs: Temp: 98.2 F (36.8 C) (06/01 0800) Temp src: Oral (06/01 0800) BP: 85/66 mmHg (06/01 0800) Pulse Rate: 74 (06/01 0800) Intake/Output from previous day: 05/31 0701 - 06/01 0700 In: 208.3 [I.V.:208.3] Out: 2008 [Urine:2008] Intake/Output from this shift:    Labs:  Recent Labs  03/22/14 1950 03/23/14 0040  WBC 22.4* 25.8*  HGB 11.4* 10.6*  PLT 199 181  CREATININE 1.22* 0.92   Estimated Creatinine Clearance: 46.7 ml/min (by C-G formula based on Cr of 0.92). No results found for this basename: VANCOTROUGH, Corlis Leak, VANCORANDOM, Rufus, GENTPEAK, GENTRANDOM, TOBRATROUGH, TOBRAPEAK, TOBRARND, AMIKACINPEAK, AMIKACINTROU, AMIKACIN,  in the last 72 hours   Microbiology: Recent Results (from the past 720 hour(s))  MRSA PCR SCREENING     Status: None   Collection Time    03/22/14 10:42 PM      Result Value Ref Range Status   MRSA by PCR NEGATIVE  NEGATIVE Final   Comment:            The GeneXpert MRSA Assay (FDA     approved for NASAL specimens     only), is one component of a     comprehensive MRSA colonization     surveillance program. It is not     intended to diagnose MRSA     infection nor to guide or     monitor treatment for     MRSA infections.    Medical History: Past Medical History  Diagnosis Date  . Arthritis   . Cataract   . Heart murmur     Assessment: 80 yr female s/p fall found per EMS with temp, feces on feet and urine odor in home.  Vancomycin 1gm given in Ed @ 20:27 and Levaquin 750mg  given in ED @ 20:42.  Vancomycin and Levaquin ordered per pharmacy dosing for sepsis.  Pt has allergy to PCN (hives, itching). CXR  clear.  5/31 >> vanc >> 5/31 >> Levaquin >>   Tmax: AF now (104.8 last night) WBCs: elevated and increasing Renal: improved to WNL. CrCl ~ 52 ml/min (normalized), 65 ml/min (CG).  5/31 blood: sent 5/31 urine: sent 5/31 MRSA screen neg  Day #2 vancomycin 1250 mg IV q24h and Levaquin 750 mg IV q48h.  Goal of Therapy:  Vancomycin trough level 15-20 mcg/ml  Plan:  1.  Adjust vancomycin to 750 mg IV q12h for improved CrCl. 2.  Adjust Levaquin to 750 mg IV q24h for improved CrCl. 3.  F/u SCr, trough levels, culture results, clinical course.  Dara Hoyer, PharmD 03/23/2014,8:36 AM

## 2014-03-23 NOTE — Progress Notes (Signed)
CARE MANAGEMENT NOTE 03/23/2014  Patient:  BRUNETTA, NEWINGHAM   Account Number:  [id number]  Date Initiated:  03/23/2014  Documentation initiated by:  DAVIS,RHONDA  Subjective/Objective Assessment:   pt with fever 104.8, ams and wbc 22.0 urosepsis/ placed in sdu overnight to be transferred to med surg floor [id number]     Action/Plan:   lives alone, was found by neighbors, confused, unable to walk   Anticipated DC Date:  03/26/2014   Anticipated DC Plan:  HOME/SELF CARE  In-house referral  NA      DC Planning Services  NA  NA      PAC Choice  NA   Choice offered to / List presented to:  NA   DME arranged  NA      DME agency  NA     [place] arranged  NA      [place] agency  NA   Status of service:  In process, will continue to follow Medicare Important Message given?  NA - LOS <3 / Initial given by admissions (If response is "NO", the following Medicare IM given date fields will be blank) Date Medicare IM given:   Date Additional Medicare IM given:    Discharge Disposition:    Per UR Regulation:  Reviewed for med. necessity/level of care/duration of stay  If discussed at [place] of Stay Meetings, dates discussed:    Comments:  [id number]/Rhonda Eldridge Dace, [place], [place] [phone] Chart Reviewed for discharge and hospital needs. Discharge needs at time of review: None present will follow for needs. Review of patient progress due on [id number].

## 2014-03-23 NOTE — Progress Notes (Signed)
[facility] PROGRESS NOTE  Dawn Lawson SWN:[id number] DOB: 04-Jan-1929 DOA: 03/22/2014 PCP: No primary provider on file.  Assessment/Plan: 1. Severe sepsis: possibly from the UTI and cellulitis. Started her on vanco and levaquin. Urine cultures are pending. Elevated procalcitonin levels.  2. Acute renal failure: possibly from pre renal . Improving. Continue with fluids.  3. Hypokalemia : replete as needed, normal mag levels.  4. Leukocytosis: possibly from the severe sepsis.  5. Gram positive bacteremia: possibly from sepsis . On IV vancomycin. Await identification and sensitivities.  6. Anemia : normocytic.   Code Status: full code Family Communication: discussed with family at bedside Disposition Plan: pending.    Consultants:  none  Procedures:  none  Antibiotics:  Vancomycin 5/31  levaquin 5/31  HPI/Subjective: Comfortable denies any pain, nausea or vomiting.   Objective: Filed Vitals:   03/23/14 0400  BP:   Pulse:   Temp: 98.8 F (37.1 C)  Resp:     Intake/Output Summary (Last 24 hours) at 03/23/14 0755 Last data filed at 03/23/14 0648  Gross per 24 hour  Intake 208.33 ml  Output   2008 ml  Net -1799.67 ml   Filed Weights   03/22/14 2014 03/22/14 2226  Weight: 80.287 kg (177 lb) 88.6 kg (195 lb 5.2 oz)    Exam:   General:  Alert afebrile comfortable  Cardiovascular: s1s2  Respiratory: ctab  Abdomen: soft NT ND BS+  Musculoskeletal: no pedal edema.    Data Reviewed: Basic Metabolic Panel:  Recent Labs Lab 03/22/14 1950 03/23/14 0040  NA 132* 137  K 3.5* 3.0*  CL 95* 102  CO2 19 22  GLUCOSE 181* 163*  BUN 35* 26*  CREATININE 1.22* 0.92  CALCIUM 8.7 8.1*   Liver Function Tests:  Recent Labs Lab 03/22/14 1950  AST 45*  ALT 28  ALKPHOS 55  BILITOT 0.5  PROT 7.0  ALBUMIN 3.4*   No results found for this basename: LIPASE, AMYLASE,  in the last 168 hours No results found for this basename: AMMONIA,  in the last 168  hours CBC:  Recent Labs Lab 03/22/14 1950 03/23/14 0040  WBC 22.4* 25.8*  HGB 11.4* 10.6*  HCT 33.3* 31.5*  MCV 88.1 89.0  PLT 199 181   Cardiac Enzymes:  Recent Labs Lab 03/22/14 1950  CKTOTAL 881*  CKMB 3.3   BNP (last 3 results) No results found for this basename: PROBNP,  in the last 8760 hours CBG: No results found for this basename: GLUCAP,  in the last 168 hours  Recent Results (from the past 240 hour(s))  MRSA PCR SCREENING     Status: None   Collection Time    03/22/14 10:42 PM      Result Value Ref Range Status   MRSA by PCR NEGATIVE  NEGATIVE Final   Comment:            The GeneXpert MRSA Assay (FDA     approved for NASAL specimens     only), is one component of a     comprehensive MRSA colonization     surveillance program. It is not     intended to diagnose MRSA     infection nor to guide or     monitor treatment for     MRSA infections.     Studies: Dg Chest Port 1 View  (if Code Sepsis Called)  03/22/2014   CLINICAL DATA:  Fever  EXAM: PORTABLE CHEST - 1 VIEW  COMPARISON:  01/11/2013  FINDINGS:  Normal cardiac silhouette. Exam is lordotic. No effusion, infiltrate, or pneumothorax. There are low lung volumes. Significant arthropathy of the shoulder joints.  IMPRESSION: No acute findings.  Low lung volumes.   Electronically Signed   By: Suzy Bouchard M.D.   On: 03/22/2014 20:08    Scheduled Meds: . sodium chloride   Intravenous STAT  . aspirin EC  81 mg Oral Daily  . heparin  5,000 Units Subcutaneous 3 times per day  . [START ON 03/24/2014] levofloxacin (LEVAQUIN) IV  750 mg Intravenous Q48H  . sodium chloride  3 mL Intravenous Q12H  . vancomycin  1,250 mg Intravenous Q24H   Continuous Infusions: . sodium chloride 125 mL/hr at 03/23/14 4854    Principal Problem:   Severe sepsis with acute organ dysfunction Active Problems:   UTI (lower urinary tract infection)   AKI (acute kidney injury)   ATN (acute tubular necrosis)   Lactic acidosis    Urinary tract infection   Cellulitis of right leg    Time spent: 35  Minutes.     Hosie Poisson  [facility] Pager [phone] If 7PM-7AM, please contact night-coverage at [email or web address], password [facility] 03/23/2014, 7:55 AM  LOS: 1 day

## 2014-03-24 LAB — BASIC METABOLIC PANEL
BUN: 16 mg/dL (ref 6–23)
CALCIUM: 8.1 mg/dL — AB (ref 8.4–10.5)
CO2: 20 meq/L (ref 19–32)
Chloride: 101 mEq/L (ref 96–112)
Creatinine, Ser: 0.69 mg/dL (ref 0.50–1.10)
GFR calc Af Amer: >90 mL/min (ref >90)
GFR calc non Af Amer: 79 mL/min — ABNORMAL LOW (ref >90)
GLUCOSE: 120 mg/dL — AB (ref 70–99)
Potassium: 3.3 mEq/L — ABNORMAL LOW (ref 3.7–5.3)
Sodium: 135 mEq/L — ABNORMAL LOW (ref 137–147)

## 2014-03-24 LAB — CBC
HCT: 31 % — ABNORMAL LOW (ref 36.0–46.0)
Hemoglobin: 10.5 g/dL — ABNORMAL LOW (ref 12.0–15.0)
MCH: 30.3 pg (ref 26.0–34.0)
MCHC: 33.9 g/dL (ref 30.0–36.0)
MCV: 89.6 fL (ref 78.0–100.0)
PLATELETS: 184 10*3/uL (ref 150–400)
RBC: 3.46 MIL/uL — AB (ref 3.87–5.11)
RDW: 14 % (ref 11.5–15.5)
WBC: 17.8 10*3/uL — ABNORMAL HIGH (ref 4.0–10.5)

## 2014-03-24 MED ORDER — POTASSIUM CHLORIDE CRYS ER 20 MEQ PO TBCR
40.0000 meq | EXTENDED_RELEASE_TABLET | Freq: Two times a day (BID) | ORAL | Status: AC
  Administered 2014-03-24 (×2): 40 meq via ORAL
  Filled 2014-03-24 (×3): qty 2

## 2014-03-24 MED ORDER — ONDANSETRON HCL 4 MG/2ML IJ SOLN
4.0000 mg | Freq: Four times a day (QID) | INTRAMUSCULAR | Status: DC | PRN
  Administered 2014-03-24: 4 mg via INTRAVENOUS
  Filled 2014-03-24: qty 2

## 2014-03-24 MED ORDER — ZOLPIDEM TARTRATE 5 MG PO TABS: 5.0000 mg | ORAL_TABLET | Freq: Every evening | ORAL | Status: DC | PRN

## 2014-03-24 MED ORDER — DIPHENHYDRAMINE HCL 50 MG PO CAPS: 50.0000 mg | ORAL_CAPSULE | Freq: Every evening | ORAL | Status: DC | PRN

## 2014-03-24 NOTE — Progress Notes (Signed)
[facility] PROGRESS NOTE  Dawn Lawson FTD:[id number] DOB: Jun 23, 1929 DOA: 03/22/2014 PCP: No primary provider on file. Interim summary: Dawn Lawson is a 80 y.o. female normally living alone, presents to ED via EMS with c/o fever. Neighbors found patient generally weak and confused appearing. Tried to help to bathroom but patient slid to floor. Feces noted on feet. Too weak to stand. EMS called. On EMS arrival patient states she "feels fine" and reports no complaints.  Despite her claims of "feeling fine" her work up argues otherwise. She is running a 104.8 fever, has a WBC of 22k, has AKI with UA suggestive of ATN and UTI and she was found to have cellulitis of the RLE. She was initially admitted to step down for borderline bp parameters and started on IV vancomycin and IV levaquin. Her BP parameters improved and she was transferred to telemetry. Her blood cultures showed group B Strepto agalactiae  And her urine cultures grew E COLI sensitivities are pending. PT EVAL ordered and pending.    Assessment/Plan: 1. Severe sepsis: possibly from the UTI and cellulitis. Started her on vanco and levaquin. Urine cultures gre E COLI and blood cultures grew group B streptococci. Elevated procalcitonin levels. Cellulitis improving.  2. Acute renal failure: possibly from pre renal . Improving. Continue with fluids.  3. Hypokalemia : replete as needed, normal mag levels.  4. Leukocytosis: possibly from the severe sepsis. improveing with antibiotics.  5. Gram positive bacteremia: possibly from sepsis . On IV vancomycin. Await identification and sensitivities.  6. Anemia : normocytic. Stable.   Code Status: full code Family Communication: discussed with family at bedside Disposition Plan: pending PT eval.    Consultants:  none  Procedures:  none  Antibiotics:  Vancomycin 5/31  levaquin 5/31  HPI/Subjective: Comfortable denies any pain, nausea or vomiting. Wants to go home tomorrow.    Objective: Filed Vitals:   03/24/14 0508  BP: 161/43  Pulse: 88  Temp: 98.1 F (36.7 C)  Resp: 22    Intake/Output Summary (Last 24 hours) at 03/24/14 1529 Last data filed at 03/24/14 1300  Gross per 24 hour  Intake   4000 ml  Output   2055 ml  Net   1945 ml   Filed Weights   03/22/14 2014 03/22/14 2226 03/23/14 1811  Weight: 80.287 kg (177 lb) 88.6 kg (195 lb 5.2 oz) 87.7 kg (193 lb 5.5 oz)    Exam:   General:  Alert afebrile comfortable  Cardiovascular: s1s2  Respiratory: ctab  Abdomen: soft NT ND BS+  Musculoskeletal: no pedal edema.  RLE cellulitis improving.   Data Reviewed: Basic Metabolic Panel:  Recent Labs Lab 03/22/14 1950 03/23/14 0040 03/24/14 0403  NA 132* 137 135*  K 3.5* 3.0* 3.3*  CL 95* 102 101  CO2 19 22 20   GLUCOSE 181* 163* 120*  BUN 35* 26* 16  CREATININE 1.22* 0.92 0.69  CALCIUM 8.7 8.1* 8.1*  MG  --  2.1  --    Liver Function Tests:  Recent Labs Lab 03/22/14 1950  AST 45*  ALT 28  ALKPHOS 55  BILITOT 0.5  PROT 7.0  ALBUMIN 3.4*   No results found for this basename: LIPASE, AMYLASE,  in the last 168 hours No results found for this basename: AMMONIA,  in the last 168 hours CBC:  Recent Labs Lab 03/22/14 1950 03/23/14 0040 03/24/14 0403  WBC 22.4* 25.8* 17.8*  HGB 11.4* 10.6* 10.5*  HCT 33.3* 31.5* 31.0*  MCV 88.1 89.0 89.6  PLT 199 181 184   Cardiac Enzymes:  Recent Labs Lab 03/22/14 1950  CKTOTAL 881*  CKMB 3.3   BNP (last 3 results) No results found for this basename: PROBNP,  in the last 8760 hours CBG: No results found for this basename: GLUCAP,  in the last 168 hours  Recent Results (from the past 240 hour(s))  URINE CULTURE     Status: None   Collection Time    03/22/14  7:44 PM      Result Value Ref Range Status   Specimen Description URINE, CATHETERIZED   Final   Special Requests Normal   Final   Culture  Setup Time     Final   Value: 03/23/2014 01:50     Performed at [facility]     Final   Value: >=100,000 COLONIES/ML     Performed at [facility]   Culture     Final   Value: ESCHERICHIA COLI     Performed at [facility]   Report Status PENDING   Incomplete  CULTURE, BLOOD (ROUTINE X 2)     Status: None   Collection Time    03/22/14  7:50 PM      Result Value Ref Range Status   Specimen Description BLOOD L FOREARM   Final   Special Requests BOTTLES DRAWN AEROBIC AND ANAEROBIC 5ML EACH   Final   Culture  Setup Time     Final   Value: 03/23/2014 01:15     Performed at [facility]   Culture     Final   Value: GROUP B STREP(S.AGALACTIAE)ISOLATED     Note: Gram Stain Report Called to,Read Back By and Verified With: MEGAN STOCKS 03/23/14 1515 BY SMITHERSJ     Performed at [facility]   Report Status PENDING   Incomplete  CULTURE, BLOOD (ROUTINE X 2)     Status: None   Collection Time    03/22/14  7:55 PM      Result Value Ref Range Status   Specimen Description BLOOD R HAND   Final   Special Requests BOTTLES DRAWN AEROBIC AND ANAEROBIC 5 ML EACH   Final   Culture  Setup Time     Final   Value: 03/23/2014 01:15     Performed at [facility]   Culture     Final   Value: GROUP B STREP(S.AGALACTIAE)ISOLATED     Note: Gram Stain Report Called to,Read Back By and Verified With: MEGAN STOCKS 03/23/14 1515 BY SMITHERSJ     Performed at [facility]   Report Status PENDING   Incomplete  MRSA PCR SCREENING     Status: None   Collection Time    03/22/14 10:42 PM      Result Value Ref Range Status   MRSA by PCR NEGATIVE  NEGATIVE Final   Comment:            The GeneXpert MRSA Assay (FDA     approved for NASAL specimens     only), is one component of a     comprehensive MRSA colonization     surveillance program. It is not     intended to diagnose MRSA     infection nor to guide or     monitor treatment for     MRSA infections.     Studies: Dg Chest Port 1 View  (if Code Sepsis  Called)  03/22/2014   CLINICAL DATA:  Fever  EXAM: PORTABLE CHEST - 1 VIEW  COMPARISON:  01/11/2013  FINDINGS: Normal cardiac silhouette. Exam is lordotic. No effusion, infiltrate, or pneumothorax. There are low lung volumes. Significant arthropathy of the shoulder joints.  IMPRESSION: No acute findings.  Low lung volumes.   Electronically Signed   By: Suzy Bouchard M.D.   On: 03/22/2014 20:08    Scheduled Meds: . aspirin EC  81 mg Oral Daily  . heparin  5,000 Units Subcutaneous 3 times per day  . levofloxacin (LEVAQUIN) IV  750 mg Intravenous Q24H  . promethazine  12.5 mg Intravenous Once  . sodium chloride  3 mL Intravenous Q12H  . vancomycin  750 mg Intravenous Q12H   Continuous Infusions: . sodium chloride 125 mL/hr at 03/24/14 0403    Principal Problem:   Severe sepsis with acute organ dysfunction Active Problems:   UTI (lower urinary tract infection)   AKI (acute kidney injury)   ATN (acute tubular necrosis)   Lactic acidosis   Urinary tract infection   Cellulitis of right leg    Time spent: 35  Minutes.     Hosie Poisson  [facility] Pager [phone] If 7PM-7AM, please contact night-coverage at [email or web address], password [facility] 03/24/2014, 3:29 PM  LOS: 2 days

## 2014-03-24 NOTE — Progress Notes (Signed)
Nausea resolved after PRN Zofran admin as ordered.

## 2014-03-25 DIAGNOSIS — R7881 Bacteremia: Secondary | ICD-10-CM

## 2014-03-25 DIAGNOSIS — R4182 Altered mental status, unspecified: Secondary | ICD-10-CM

## 2014-03-25 LAB — CULTURE, BLOOD (ROUTINE X 2)

## 2014-03-25 LAB — URINE CULTURE
Colony Count: >=100000
Special Requests: NORMAL

## 2014-03-25 LAB — BASIC METABOLIC PANEL
BUN: 12 mg/dL (ref 6–23)
CALCIUM: 8.2 mg/dL — AB (ref 8.4–10.5)
CO2: 25 mEq/L (ref 19–32)
CREATININE: 0.67 mg/dL (ref 0.50–1.10)
Chloride: 103 mEq/L (ref 96–112)
GFR calc non Af Amer: 80 mL/min — ABNORMAL LOW (ref >90)
Glucose, Bld: 130 mg/dL — ABNORMAL HIGH (ref 70–99)
Potassium: 4.2 mEq/L (ref 3.7–5.3)
Sodium: 137 mEq/L (ref 137–147)

## 2014-03-25 LAB — PROCALCITONIN: Procalcitonin: 19.66 ng/mL

## 2014-03-25 LAB — CBC
HCT: 30.2 % — ABNORMAL LOW (ref 36.0–46.0)
Hemoglobin: 9.9 g/dL — ABNORMAL LOW (ref 12.0–15.0)
MCH: 29.3 pg (ref 26.0–34.0)
MCHC: 32.8 g/dL (ref 30.0–36.0)
MCV: 89.3 fL (ref 78.0–100.0)
PLATELETS: 215 10*3/uL (ref 150–400)
RBC: 3.38 MIL/uL — ABNORMAL LOW (ref 3.87–5.11)
RDW: 13.9 % (ref 11.5–15.5)
WBC: 14.6 10*3/uL — ABNORMAL HIGH (ref 4.0–10.5)

## 2014-03-25 MED ORDER — TRAZODONE HCL 50 MG PO TABS
50.0000 mg | ORAL_TABLET | Freq: Every day | ORAL | Status: DC
  Administered 2014-03-25: 50 mg via ORAL
  Filled 2014-03-25 (×2): qty 1

## 2014-03-25 MED ORDER — DIPHENHYDRAMINE HCL 50 MG PO CAPS
50.0000 mg | ORAL_CAPSULE | Freq: Every evening | ORAL | Status: DC | PRN
  Administered 2014-03-25: 50 mg via ORAL
  Filled 2014-03-25: qty 1

## 2014-03-25 MED ORDER — BISOPROLOL-HYDROCHLOROTHIAZIDE 2.5-6.25 MG PO TABS
1.0000 | ORAL_TABLET | Freq: Every day | ORAL | Status: DC
  Administered 2014-03-25 – 2014-03-26 (×2): 1 via ORAL
  Filled 2014-03-25 (×2): qty 1

## 2014-03-25 NOTE — Progress Notes (Signed)
ANTIBIOTIC CONSULT NOTE - FOLLOW UP  Pharmacy Consult for Vancomycin & Levaquin Indication: R/O Sepsis  Allergies  Allergen Reactions  . Ampicillin Hives and Itching  . Penicillins Hives and Itching    Patient Measurements: Height: 4\' 11"  (149.9 cm) Weight: 193 lb 5.5 oz (87.7 kg) IBW/kg (Calculated) : 43.2  Vital Signs: Temp: 99.8 F (37.7 C) (06/03 0631) Temp src: Oral (06/03 0631) BP: 168/57 mmHg (06/03 0631) Pulse Rate: 95 (06/03 0631) Intake/Output from previous day: 06/02 0701 - 06/03 0700 In: 2240 [P.O.:840; I.V.:1000; IV Piggyback:400] Out: 2700 [Urine:2700] Intake/Output from this shift:    Labs:  Recent Labs  03/23/14 0040 03/24/14 0403 03/25/14 0353  WBC 25.8* 17.8* 14.6*  HGB 10.6* 10.5* 9.9*  PLT 181 184 215  CREATININE 0.92 0.69 0.67   Estimated Creatinine Clearance: 52.2 ml/min (by C-G formula based on Cr of 0.67). No results found for this basename: Letta Median, VANCORANDOM, [place], GENTPEAK, GENTRANDOM, TOBRATROUGH, TOBRAPEAK, TOBRARND, AMIKACINPEAK, AMIKACINTROU, AMIKACIN,  in the last 72 hours   Microbiology: Recent Results (from the past 720 hour(s))  URINE CULTURE     Status: None   Collection Time    03/22/14  7:44 PM      Result Value Ref Range Status   Specimen Description URINE, CATHETERIZED   Final   Special Requests Normal   Final   Culture  Setup Time     Final   Value: 03/23/2014 01:50     Performed at [facility]     Final   Value: >=100,000 COLONIES/ML     Performed at [facility]   Culture     Final   Value: ESCHERICHIA COLI     Performed at [facility]   Report Status PENDING   Incomplete  CULTURE, BLOOD (ROUTINE X 2)     Status: None   Collection Time    03/22/14  7:50 PM      Result Value Ref Range Status   Specimen Description BLOOD L FOREARM   Final   Special Requests BOTTLES DRAWN AEROBIC AND ANAEROBIC 5ML EACH   Final   Culture  Setup Time     Final    Value: 03/23/2014 01:15     Performed at [facility]   Culture     Final   Value: GROUP B STREP(S.AGALACTIAE)ISOLATED     Note: SUSCEPTIBILITIES PERFORMED ON PREVIOUS CULTURE WITHIN THE LAST 5 DAYS.     Note: Gram Stain Report Called to,Read Back By and Verified With: MEGAN STOCKS 03/23/14 1515 BY SMITHERSJ     Performed at [facility]   Report Status 03/25/2014 FINAL   Final  CULTURE, BLOOD (ROUTINE X 2)     Status: None   Collection Time    03/22/14  7:55 PM      Result Value Ref Range Status   Specimen Description BLOOD R HAND   Final   Special Requests BOTTLES DRAWN AEROBIC AND ANAEROBIC 5 ML EACH   Final   Culture  Setup Time     Final   Value: 03/23/2014 01:15     Performed at [facility]   Culture     Final   Value: GROUP B STREP(S.AGALACTIAE)ISOLATED     Note: Gram Stain Report Called to,Read Back By and Verified With: MEGAN STOCKS 03/23/14 1515 BY SMITHERSJ     Performed at [facility]   Report Status 03/25/2014 FINAL   Final   Organism ID,  Bacteria GROUP B STREP(S.AGALACTIAE)ISOLATED   Final  MRSA PCR SCREENING     Status: None   Collection Time    03/22/14 10:42 PM      Result Value Ref Range Status   MRSA by PCR NEGATIVE  NEGATIVE Final   Comment:            The GeneXpert MRSA Assay (FDA     approved for NASAL specimens     only), is one component of a     comprehensive MRSA colonization     surveillance program. It is not     intended to diagnose MRSA     infection nor to guide or     monitor treatment for     MRSA infections.    Medical History: Past Medical History  Diagnosis Date  . Arthritis   . Cataract   . Heart murmur     Medications:  Scheduled:  . aspirin EC  81 mg Oral Daily  . heparin  5,000 Units Subcutaneous 3 times per day  . levofloxacin (LEVAQUIN) IV  750 mg Intravenous Q24H  . promethazine  12.5 mg Intravenous Once  . sodium chloride  3 mL Intravenous Q12H  . vancomycin  750 mg Intravenous Q12H    Assessment: 80 yo female s/p fall found per EMS with fever, feces on feet and urine odor in home. Pharmacy has been consulted to manage vancomycin and levaquin therapy for rule-out sepsis. Today is day 4 of antibiotics.   Fever trending down (Tmax 100.3)  WBC trending down 25.8 > 17.8 > 14.6  PCT also trending down 38.76 > 19.66  Renal function stabilized with CrCl ~ 52 ml/min  Cultures:  5/31 blood >> group B strep (pan sensitive)  5/31 urine >> E Coli (sensitivities pending)   Goal of Therapy:  Vancomycin trough level 15-20 mcg/ml  Plan:  Continue Levaquin 750mg  IV q24h Continue Vancomycin 750mg  IV q12h -- would consider discontinuing as group B strep sensitive to levaquin Follow up narrowing of antibiotic therapy, culture sensitivities, clinical progression, renal function, antibiotic plan  Dom Haverland C. Dawn Kiper, PharmD Clinical Pharmacist-Resident Pager: [phone] 03/25/2014 10:06 AM

## 2014-03-25 NOTE — Evaluation (Signed)
Physical Therapy Evaluation Patient Details Name: Dawn Lawson MRN: [id number] DOB: [birth year] Today's Date: 03/25/2014   History of Present Illness  80 yo  female admitrted 03/21/14 with fever, weakness, confusion, sepsis possibly from UTI.  Clinical Impression  Pt anxious to go home. Pt did not want a RW but family member and PT highly encouraged her and pt committed. Pt will benefit from PT while in acute care to address problems  To return to mod. Independence.   Follow Up Recommendations Home health PT;Supervision/Assistance - 24 hour    Equipment Recommendations  Rolling walker with 5" wheels    Recommendations for Other Services       Precautions / Restrictions Precautions Precautions: Fall Precaution Comments: knees are bad.      Mobility  Bed Mobility Overal bed mobility: Needs Assistance Bed Mobility: Rolling;Sidelying to Sit Rolling: Min assist Sidelying to sit: Mod assist;HOB elevated       General bed mobility comments: extra time to roll, support to get trunk up right  Transfers Overall transfer level: Needs assistance Equipment used: Rolling walker (2 wheeled) Transfers: Sit to/from Stand Sit to Stand: Min assist;Mod assist         General transfer comment: min assit from bed. mod assist from low toilet. Attempted standing with QC but not able to stabilize without assistance.  Ambulation/Gait Ambulation/Gait assistance: Min assist Ambulation Distance (Feet): 85 Feet Assistive device: Rolling walker (2 wheeled);Quad cane Gait Pattern/deviations: Decreased dorsiflexion - left   Gait velocity interpretation: <1.8 ft/sec, indicative of risk for recurrent falls General Gait Details: Attempted gait with QC, pt not steady,  requires 2 UE's for stabilization and safety.  Stairs            Wheelchair Mobility    Modified Rankin (Stroke Patients Only)       Balance Overall balance assessment: Needs assistance Sitting-balance support: Feet  supported;No upper extremity supported Sitting balance-Leahy Scale: Good     Standing balance support: Bilateral upper extremity supported Standing balance-Leahy Scale: Fair                               Pertinent Vitals/Pain reports knees  Are chronic/ painful    Home Living Family/patient expects to be discharged to:: Private residence Living Arrangements: Alone;Non-relatives/Friends;Children;Other relatives Available Help at Discharge: Available 24 hours/day;Family Type of Home: House Home Access: Stairs to enter Entrance Stairs-Rails: Right;Left Entrance Stairs-Number of Steps: 2   Home Equipment: Walker - standard;Cane - quad;Shower seat;Grab bars - toilet;Grab bars - tub/shower Additional Comments: high rise toilet    Prior Function Level of Independence: Independent with assistive device(s)               Hand Dominance        Extremity/Trunk Assessment   Upper Extremity Assessment: Defer to OT evaluation           Lower Extremity Assessment: Generalized weakness;RLE deficits/detail;LLE deficits/detail RLE Deficits / Details: decreased knee flexion       Communication   Communication: No difficulties  Cognition Arousal/Alertness: Awake/alert Behavior During Therapy: WFL for tasks assessed/performed Overall Cognitive Status: Within Functional Limits for tasks assessed                      General Comments      Exercises        Assessment/Plan    PT Assessment Patient needs continued PT services  PT Diagnosis Generalized weakness  PT Problem List Decreased strength;Decreased activity tolerance;Decreased balance;Decreased mobility;Decreased knowledge of precautions;Decreased safety awareness;Decreased knowledge of use of DME  PT Treatment Interventions DME instruction;Gait training;Functional mobility training;Therapeutic activities;Therapeutic exercise;Patient/family education   PT Goals (Current goals can be found in  the Care Plan section) Acute Rehab PT Goals Patient Stated Goal: I want to go home today PT Goal Formulation: With patient/family Time For Goal Achievement: 04/08/14 Potential to Achieve Goals: Good    Frequency Min 3X/week   Barriers to discharge        Co-evaluation               End of Session Equipment Utilized During Treatment: Gait belt Activity Tolerance: Patient tolerated treatment well Patient left: in chair;with call bell/phone within reach;with family/visitor present Nurse Communication: Mobility status         Time: 7017-7939 PT Time Calculation (min): 31 min   Charges:   PT Evaluation $Initial PT Evaluation Tier I: 1 Procedure PT Treatments $Gait Training: 8-22 mins $Self Care/Home Management: 8-22   PT G Codes:          Claretha Cooper 03/25/2014, 9:54 AM Tresa Endo PT [phone]

## 2014-03-25 NOTE — Evaluation (Signed)
Occupational Therapy Evaluation Patient Details Name: Dawn Lawson MRN: [id number] DOB: February 03, 1929 Today's Date: 03/25/2014    History of Present Illness 80 yo  female admitrted 03/21/14 with fever, weakness, confusion, sepsis possibly from UTI.   Clinical Impression   Pt was admitted for the above.  At baseline, she lives alone and is independent with adls.  Pt needs mod A with bed mobility and max A for LB adls at this time.  She will benefit from skilled OT to increase safety and independence with adls.  Goals in acute are for min A to min guard overall.      Follow Up Recommendations  Home health OT;Supervision/Assistance - 24 hour - Pt really wants home and states she can have 24/7.  She would be a candidate for SNF, if agreeable   Equipment Recommendations  3 in 1 bedside comode    Recommendations for Other Services       Precautions / Restrictions Precautions Precautions: Fall Precaution Comments: knees are bad. Restrictions Weight Bearing Restrictions: No      Mobility Bed Mobility       Sidelying to sit: Mod assist;HOB elevated       General bed mobility comments: extra time and support for trunk  Transfers                      Balance   Sitting-balance support: Feet supported Sitting balance-Leahy Scale: Good                                      ADL Overall ADL's : Needs assistance/impaired     Grooming: Oral care;Set up;Sitting   Upper Body Bathing: Set up;Sitting   Lower Body Bathing: Maximal assistance;Sit to/from stand   Upper Body Dressing : Set up;Sitting   Lower Body Dressing: Maximal assistance;Sit to/from stand                 General ADL Comments: Pt fatiques easily.  Only performed grooming at eob.  Pt does not wear socks normally.  She has slip on shoes and pants.  Unable to reach with simulated pants today.  Pt does have a reacher at home and educated on uses for [employer]     Praxis      Pertinent Vitals/Pain R knee painful with movement     Hand Dominance     Extremity/Trunk Assessment Upper Extremity Assessment Upper Extremity Assessment: Generalized weakness           Communication Communication Communication: No difficulties   Cognition Arousal/Alertness: Awake/alert Behavior During Therapy: WFL for tasks assessed/performed Overall Cognitive Status: Within Functional Limits for tasks assessed                     General Comments       Exercises       Shoulder Instructions      Home Living Family/patient expects to be discharged to:: Private residence Living Arrangements: Alone;Non-relatives/Friends;Children;Other relatives Available Help at Discharge: Available 24 hours/day;Family Type of Home: House Home Access: Stairs to enter Entrance Stairs-Number of Steps: 2         Bathroom Shower/Tub: Tub/shower unit Shower/tub characteristics: Architectural technologist: Standard     Home Equipment: Environmental consultant - standard;Cane -  quad;Shower seat;Grab bars - toilet;Grab bars - tub/shower          Prior Functioning/Environment Level of Independence: Independent with assistive device(s)             OT Diagnosis: Generalized weakness   OT Problem List: Decreased strength;Decreased activity tolerance;Decreased knowledge of use of DME or AE;Pain   OT Treatment/Interventions: Self-care/ADL training;DME and/or AE instruction;Patient/family education;Therapeutic activities    OT Goals(Current goals can be found in the care plan section) Acute Rehab OT Goals Patient Stated Goal: home OT Goal Formulation: With patient Time For Goal Achievement: 04/08/14 Potential to Achieve Goals: Good ADL Goals Pt Will Perform Lower Body Bathing: with min assist;with adaptive equipment;sit to/from stand Pt Will Perform Lower Body Dressing: with min assist;with adaptive equipment;sit to/from stand Pt Will Transfer to  Toilet: with min guard assist;ambulating;bedside commode (and toileting) Additional ADL Goal #1: pt will perform bed mobility with min A in preparation for adls.  OT Frequency: Min 2X/week   Barriers to D/C:            Co-evaluation              End of Session    Activity Tolerance: Patient tolerated treatment well Patient left: in bed;with call bell/phone within reach   Time: 2248-2500 OT Time Calculation (min): 18 min Charges:  OT General Charges $OT Visit: 1 Procedure OT Evaluation $Initial OT Evaluation Tier I: 1 Procedure OT Treatments $Self Care/Home Management : 8-22 mins G-Codes:    Lesle Chris 16-Apr-2014, 3:20 PM  Lesle Chris, OTR/L [phone] 2014/04/16

## 2014-03-25 NOTE — Care Management Note (Addendum)
    Page 1 of 2   03/26/2014     12:19:48 PM CARE MANAGEMENT NOTE 03/26/2014  Patient:  Dawn Lawson, Dawn Lawson   Account Number:  [id number]  Date Initiated:  03/23/2014  Documentation initiated by:  DAVIS,RHONDA  Subjective/Objective Assessment:   pt with fever 104.8, ams and wbc 22.0 urosepsis/ placed in sdu overnight to be transferred to med surg floor [id number]     Action/Plan:   lives alone, was found by neighbors, confused, unable to walk   Anticipated DC Date:  03/26/2014   Anticipated DC Plan:  HOME/SELF CARE  In-house referral  NA      DC Planning Services  NA  NA      PAC Choice  NA   Choice offered to / List presented to:  NA   DME arranged  NA      DME agency  NA     [place] arranged  NA      [place] agency  NA   Status of service:  Completed, signed off Medicare Important Message given?  NA - LOS <3 / Initial given by admissions (If response is "NO", the following Medicare IM given date fields will be blank) Date Medicare IM given:   Date Additional Medicare IM given:    Discharge Disposition:  [place]  Per UR Regulation:  Reviewed for med. necessity/level of care/duration of stay  If discussed at [facility] of Stay Meetings, dates discussed:    Comments:  03/26/14 11:45 CM texted [facility] rep to add-on home IV ABX, INVANZ.  Pt having  PICC placed 12:10.  Plan is to discharge today to home.  Mariane Masters, BSN, [place] [phone].   03/25/14 13:05 CM texted [facility] of add-ons for HHPT/OT/RN/aide. RW will be delivered to room prior to discharge.   No other CM needs were communicated.  Mariane Masters, BSN, CM [phone]. 11:15 CM met with pt in room.   Despite facesheet, pt states her PCP is Dr. Rex Kras of [place].  CM offered choice for HHPT.   Pt states she has no preference and [facility] will be fine.  Referral texted to [facility] rep Kristen. DME rep notified for delivery of RW to room prior to discharge. Request made to MD for HHPT and RW orders; waiting for orders to be  placed.  Mariane Masters, BSN, [place] [phone].    [phone] Rosana Hoes, [place], [place], CCM [phone] Chart Reviewed for discharge and hospital needs. Discharge needs at time of review: None present will follow for needs. Review of patient progress due on [id number].

## 2014-03-25 NOTE — Progress Notes (Signed)
Patient ID: Dawn Lawson, female   DOB: May 31, 1929, 80 y.o.   MRN: [id number] [facility] PROGRESS NOTE  SYNDEY JASKOLSKI NBV:[id number] DOB: 11-Apr-1929 DOA: 03/22/2014 PCP: No primary provider on file.  Brief narrative: 80 y.o. female with no significant past medical history, leaves alone at home who presented to [facility] ED 03/22/2014 with generalized weakness and confusion. On admission, patient was found to have a fever of 104.8 F, white blood cell count of 22 and acute renal failure with creatinine of 1.22. Patient was initially admitted to step down unit for treatment of possible sepsis. She was found to have right lower extremity cellulitis She was started on IV vancomycin and Levaquin on admission. Hospital course is complicated due to additional findings of Escherichia coli UTI and group B strep bacteremia.  Assessment/Plan:   Principal Problem: Sepsis/group B strep bacteremia/E. coli UTI/right lower extremity cellulitis  The patient met criteria of severe sepsis on admission considering fever of 104.8 F, elevated white blood cell count of 22, elevated procalcitonin level at 38.76 and evidence of right lower extremity cellulitis. Further, she was found to have Escherichia coli UTI and group B strep bacteremia.  Patient was initially on IV vancomycin and Levaquin. Based on blood cultures obtained on the admission, group B strep is sensitive to multiple antibiotics including Levaquin. We will discontinue vancomycin today but continue Levaquin. I will speak with infectious disease for input on antibiotic choice and duration. Active problems: Acute renal failure  Likely prerenal etiology, urinary tract infection and dehydration  Creatinine resolved to normal with IV fluids Hypokalemia  Repleted. Potassium is within normal limits  Leukocytosis  Secondary to sepsis, bacteremia, UTI, cellulitis  White blood cell count trended down to 15  Continue Levaquin IV  Code Status: full code   Family Communication: discussed with family at bedside  Disposition Plan: pending PT eval.   Consultants:  None  Procedures:  None  Antibiotics:  Vancomycin 5/31 --> 03/25/2014 Levaquin 5/31 -->   DVT prophylaxis: heparin subcutaneous   Code Status: full code  Family Communication: plan of care discussed with the patient Disposition Plan: home when stable   Robbie Lis, MD  [facility] Pager [phone]  If 7PM-7AM, please contact night-coverage [email or web address] Password [facility] 03/25/2014, 12:57 PM   LOS: 3 days    HPI/Subjective: No acute overnight events.  Objective: Filed Vitals:   03/24/14 0508 03/24/14 1400 03/24/14 2205 03/25/14 0631  BP: 161/43 153/48 161/55 168/57  Pulse: 88 90 88 95  Temp: 98.1 F (36.7 C) 99.3 F (37.4 C) 100.3 F (37.9 C) 99.8 F (37.7 C)  TempSrc: Oral Oral Oral Oral  Resp: '22 20 16 18  ' Height:      Weight:      SpO2: 94% 94% 95% 95%    Intake/Output Summary (Last 24 hours) at 03/25/14 1257 Last data filed at 03/25/14 1249  Gross per 24 hour  Intake   2480 ml  Output   1900 ml  Net    580 ml    Exam:   General:  Pt is alert, follows commands appropriately, not in acute distress  Cardiovascular: Regular rate and rhythm, S1/S2, no murmurs  Respiratory: Clear to auscultation bilaterally, no wheezing, no crackles, no rhonchi  Abdomen: Soft, non tender, non distended, bowel sounds present  Extremities: No edema, pulses DP and PT palpable bilaterally; LE cellulitis resolved   Neuro: Grossly nonfocal  Data Reviewed: Basic Metabolic Panel:  Recent Labs Lab 03/22/14 1950 03/23/14 0040 03/24/14 0403  03/25/14 0353  NA 132* 137 135* 137  K 3.5* 3.0* 3.3* 4.2  CL 95* 102 101 103  CO2 '19 22 20 25  ' GLUCOSE 181* 163* 120* 130*  BUN 35* 26* 16 12  CREATININE 1.22* 0.92 0.69 0.67  CALCIUM 8.7 8.1* 8.1* 8.2*  MG  --  2.1  --   --    Liver Function Tests:  Recent Labs Lab 03/22/14 1950  AST 45*  ALT 28  ALKPHOS  55  BILITOT 0.5  PROT 7.0  ALBUMIN 3.4*   No results found for this basename: LIPASE, AMYLASE,  in the last 168 hours No results found for this basename: AMMONIA,  in the last 168 hours CBC:  Recent Labs Lab 03/22/14 1950 03/23/14 0040 03/24/14 0403 03/25/14 0353  WBC 22.4* 25.8* 17.8* 14.6*  HGB 11.4* 10.6* 10.5* 9.9*  HCT 33.3* 31.5* 31.0* 30.2*  MCV 88.1 89.0 89.6 89.3  PLT 199 181 184 215   Cardiac Enzymes:  Recent Labs Lab 03/22/14 1950  CKTOTAL 881*  CKMB 3.3   BNP: No components found with this basename: POCBNP,  CBG: No results found for this basename: GLUCAP,  in the last 168 hours  URINE CULTURE     Status: None   Collection Time    03/22/14  7:44 PM      Result Value Ref Range Status   Specimen Description URINE, CATHETERIZED   Final   Value: ESCHERICHIA COLI     Performed at [facility]   Report Status 03/25/2014 FINAL   Final   Organism ID, Bacteria ESCHERICHIA COLI   Final  CULTURE, BLOOD (ROUTINE X 2)     Status: None   Collection Time    03/22/14  7:50 PM      Result Value Ref Range Status   Specimen Description BLOOD L FOREARM   Final   Value: GROUP B STREP(S.AGALACTIAE)ISOLATED     Performed at [facility]   Report Status 03/25/2014 FINAL   Final  CULTURE, BLOOD (ROUTINE X 2)     Status: None   Collection Time    03/22/14  7:55 PM      Result Value Ref Range Status   Specimen Description BLOOD R HAND   Final   Organism ID, Bacteria GROUP B STREP(S.AGALACTIAE)ISOLATED   Final  MRSA PCR SCREENING     Status: None   Collection Time    03/22/14 10:42 PM      Result Value Ref Range Status   MRSA by PCR NEGATIVE  NEGATIVE Final     Studies: No results found.  Scheduled Meds: . aspirin EC  81 mg Oral Daily  . bisoprolol-hydrochlorothia  1 tablet Oral Daily  . heparin  5,000 Units Subcutaneous 3 times per day  . levofloxacin (LEVAQUIN)  750 mg Intravenous Q24H  . promethazine  12.5 mg Intravenous Once       \

## 2014-03-26 MED ORDER — SODIUM CHLORIDE 0.9 % IJ SOLN: 10.0000 mL | INTRAMUSCULAR | Status: DC | PRN

## 2014-03-26 MED ORDER — ERTAPENEM SODIUM 1 G IJ SOLR
1.0000 g | INTRAMUSCULAR | Status: DC
  Filled 2014-03-26: qty 1

## 2014-03-26 MED ORDER — SODIUM CHLORIDE 0.9 % IJ SOLN: 10.0000 mL | Freq: Two times a day (BID) | INTRAMUSCULAR | Status: DC

## 2014-03-26 MED ORDER — SODIUM CHLORIDE 0.9 % IV SOLN
1.0000 g | INTRAVENOUS | Status: DC
  Administered 2014-03-26: 1 g via INTRAVENOUS
  Filled 2014-03-26: qty 1

## 2014-03-26 MED ORDER — ACYCLOVIR 5 % EX OINT: TOPICAL_OINTMENT | CUTANEOUS | Status: DC

## 2014-03-26 MED ORDER — ACYCLOVIR 5 % EX OINT
TOPICAL_OINTMENT | CUTANEOUS | Status: DC
  Administered 2014-03-26: 13:00:00 via TOPICAL
  Filled 2014-03-26: qty 30

## 2014-03-26 MED ORDER — SODIUM CHLORIDE 0.9 % IV SOLN: 1.0000 g | INTRAVENOUS | Status: DC

## 2014-03-26 NOTE — Discharge Instructions (Signed)
Bacteremia Bacteremia occurs when bacteria get in your blood. Normal blood does not usually have bacteria. Bacteremia is one way infections can spread from one part of the body to another. CAUSES   Causes may include anything that allows bacteria to get into the body. Examples are:  Catheters.  Intravenous (IV) access tubes.  Cuts or scrapes of the skin.  Temporary bacteremia may occur during dental procedures, while brushing your teeth, or during a bowel movement. This rarely causes any symptoms or medical problems.  Bacteria may also get in the bloodstream as a complication of a bacterial infection elsewhere. This includes infected wounds and bacterial infections of the:  Lungs (pneumonia).  Kidneys (pyelonephritis).  Intestines (enteritis, colitis).  Organs in the abdomen (appendicitis, cholecystitis, diverticulitis). SYMPTOMS  The body is usually able to clear small numbers of bacteria out of the blood quickly. Brief bacteremia usually does not cause problems.   Problems can occur if the bacteria start to grow in number or spread to other parts of the body. If the bacteria start growing, you may develop:  Chills.  Fever.  Nausea.  Vomiting.  Sweating.  Lightheadedness and low blood pressure.  Pain.  If bacteria start to grow in the linings around the brain, it is called meningitis. This can cause severe headaches, many other problems, and even death.  If bacteria start to grow in a joint, it causes arthritis with painful joints. If bacteria start to grow in a bone, it is called osteomyelitis.  Bacteria from the blood can also cause sores (abscesses) in many organs, such as the muscle, liver, spleen, lungs, brain, and kidneys. DIAGNOSIS   This condition is diagnosed by cultures of the blood.  Cultures may also be taken from other parts of the body that are thought to be causing the bacteremia. A small piece of tissue, fluid, or other product of the body is  sampled. The sample is then put on a growth plate to see if any bacteria grows.  Other lab tests may be done and the results may be abnormal. TREATMENT  Treatment requires a stay in the hospital. You will be given antibiotic medicine through an IV access tube. PREVENTION  People with an increased risk of developing bacteremia or complications may be given antibiotics before certain procedures. Examples are:  A person with a heart murmur or artificial heart valve, before having his or her teeth cleaned.  Before having a surgical or other invasive procedure.  Before having a bowel procedure. Document Released: 07/23/2006 Document Revised: 01/01/2012 Document Reviewed: 05/04/2011 [facility] Patient Information 2014 [place]. Transesophageal Echocardiography Transesophageal echocardiography (TEE) is a special type of test that produces images of the heart by using sound waves (echocardiogram). This type of echocardiography can obtain better images of the heart than standard echocardiography. TEE is done by passing a flexible tube down the esophagus. The heart is located in front of the esophagus. Because the heart and esophagus are close to one another, your health care provider can take very clear, detailed pictures of the heart via ultrasound waves. TEE may be done:  If your health care provider needs more information based on standard echocardiography findings.  If you had a stroke. This might have happened because a clot formed in your heart. TEE can visualize different areas of the heart and check for clots.  To check valve anatomy and function.  To check for infection on the inside of your heart (endocarditis).  To evaluate the dividing wall (septum) of the  heart and presence of a hole that did not close after birth (patent foramen ovale or atrial septal defect).  To help diagnose a tear in the wall of the aorta (aortic dissection).  During cardiac valve surgery. This allows the  surgeon to assess the valve repair before closing the chest.  During a variety of other cardiac procedures to guide positioning of catheters.  Sometimes before a cardioversion, which is a shock to convert heart rhythm back to normal. LET [facility] CARE PROVIDER KNOW ABOUT:   Any allergies you have.  All medicines you are taking, including vitamins, herbs, eye drops, creams, and over-the-counter medicines.  Previous problems you or members of your family have had with the use of anesthetics.  Any blood disorders you have.  Previous surgeries you have had.  Medical conditions you have.  Swallowing difficulties.  An esophageal obstruction. RISKS AND COMPLICATIONS  Generally, TEE is a safe procedure. However, as with any procedure, complications can occur. Possible complications include an esophageal tear (rupture). BEFORE THE PROCEDURE   Do not eat or drink for 6 hours before the procedure or as directed by your health care provider.  Arrange for someone to drive you home after the procedure. Do not drive yourself home. During the procedure, you will be given medicines that can continue to make you feel drowsy and can impair your reflexes.  An IV access tube will be started in the arm. PROCEDURE   A medicine to help you relax (sedative) will be given through the IV access tube.  A medicine that numbs the area (local anesthetic) may be sprayed in the back of the throat.  Your blood pressure, heart rate, and breathing (vital signs) will be monitored during the procedure.  The TEE probe is a long, flexible tube. The tip of the probe is placed into the back of the mouth, and you will be asked to swallow. This helps to pass the tip of the probe into the esophagus. Once the tip of the probe is in the correct area, your health care provider can take pictures of the heart.  TEE is usually not a painful procedure. You may feel the probe press against the back of the throat. The probe  does not enter the trachea and does not affect your breathing.  Your time spent at the hospital is usually less than 2 hours. AFTER THE PROCEDURE   You will be in bed, resting, until you have fully returned to consciousness.  When you first awaken, your throat may feel slightly sore and will probably still feel numb. This will improve slowly over time.  You will not be allowed to eat or drink until it is clear that the numbness has improved.  Once you have been able to drink, urinate, and sit on the edge of the bed without feeling sick to your stomach (nausea) or dizzy, you may be cleared to go home.  You should have a friend or family member with you for the next 24 hours after your procedure. Document Released: 12/30/2002 Document Revised: 07/30/2013 Document Reviewed: 04/10/2013 [facility] Patient Information 2014 [place], [place].

## 2014-03-26 NOTE — Discharge Summary (Addendum)
Physician Discharge Summary  Dawn Lawson HTD:[id number] DOB: [birth year] DOA: 03/22/2014  PCP: No primary provider on file.  Admit date: 03/22/2014 Discharge date: 03/26/2014  Recommendations for Outpatient Follow-up:  1. Continue vancomycin total of 13 days on discharge for treatment of group B strep bacteremia. Dosing of vancomycin per [facility], blood work includes checking vanco level at least every 3 days to make sure it is WNL. Blood cultures were repeated on 03/25/2014 and they were negative. Please note that the treatment is for blood cultures which were done 03/22/2014 which grew group B streptococcus. 2. Cardiology knows the number to call to schedule TEE as we discussed to explore for possible heart vegetations. They will call you to schedule appt early next week.  3. Please follow up in [facility] 04-14-2014 at 2 pm. Phone number [phone].  Discharge Diagnoses:  Principal Problem:   Severe sepsis with acute organ dysfunction Active Problems:   UTI (lower urinary tract infection)   AKI (acute kidney injury)   ATN (acute tubular necrosis)   Lactic acidosis   Urinary tract infection   Cellulitis of right leg    Discharge Condition: stable   Diet recommendation: as tolerated   History of present illness:  80 y.o. female with no significant past medical history, leaves alone at home who presented to [facility] ED 03/22/2014 with generalized weakness and confusion. On admission, patient was found to have a fever of 104.8 F, white blood cell count of 22 and acute renal failure with creatinine of 1.22. Patient was initially admitted to step down unit for treatment of possible sepsis. She was found to have right lower extremity cellulitis She was started on IV vancomycin and Levaquin on admission. Hospital course is complicated due to additional findings of Escherichia coli UTI and group B strep bacteremia.   Assessment/Plan:   Principal Problem:  Sepsis/group B strep  bacteremia/E. coli UTI/right lower extremity cellulitis  The patient met criteria of severe sepsis on admission considering fever of 104.8 F, elevated white blood cell count of 22, elevated procalcitonin level at 38.76 and evidence of right lower extremity cellulitis. Further, she was found to have Escherichia coli UTI and group B strep bacteremia.  Patient was initially on IV vancomycin and Levaquin. Based on blood cultures obtained on the admission, group B strep is sensitive to multiple antibiotics including Levaquin. We discontinued vancomycin 6/3 but continued Levaquin. Per ID we can continue Vancomycin for 13 more days on discharge. Active problems:  Acute renal failure  Likely prerenal etiology, urinary tract infection and dehydration  Creatinine resolved to normal with IV fluids Hypokalemia  Repleted. Potassium is within normal limits  Leukocytosis  Secondary to sepsis, bacteremia, UTI, cellulitis  White blood cell count trended down to  14.6    Consultants:  None  Procedures:  None  Antibiotics:  Vancomycin 5/31 --> 03/25/2014  Levaquin 5/31 --> 03/26/2014 Vancomycin on discharge   DVT prophylaxis: heparin subcutaneous    Code Status: full code  Family Communication: plan of care discussed with the patient and her family at the bedside    Signed:  Robbie Lis, MD  [facility] 03/26/2014, 11:33 AM  Pager #: [phone]    Discharge Exam: Filed Vitals:   03/26/14 0645  BP: 156/58  Pulse: 79  Temp: 99.5 F (37.5 C)  Resp: 18   Filed Vitals:   03/25/14 0631 03/25/14 1332 03/25/14 2108 03/26/14 0645  BP: 168/57 178/65 153/52 156/58  Pulse: 95 85 75 79  Temp: 99.8 F (37.7 C) 98.8 F (37.1 C) 99.8 F (37.7 C) 99.5 F (37.5 C)  TempSrc: Oral Oral Oral Oral  Resp: '18 18 16 18  ' Height:      Weight:      SpO2: 95% 97% 95% 98%    General: Pt is alert, follows commands appropriately, not in acute distress Cardiovascular: Regular rate and rhythm,  S1/S2 +, no murmurs Respiratory: Clear to auscultation bilaterally, no wheezing, no crackles, no rhonchi Abdominal: Soft, non tender, non distended, bowel sounds +, no guarding Extremities: no edema, no cyanosis, pulses palpable bilaterally DP and PT Neuro: Grossly nonfocal  Discharge Instructions  Discharge Instructions   Call MD for:  difficulty breathing, headache or visual disturbances    Complete by:  As directed      Call MD for:  persistant dizziness or light-headedness    Complete by:  As directed      Call MD for:  persistant nausea and vomiting    Complete by:  As directed      Call MD for:  severe uncontrolled pain    Complete by:  As directed      Diet - low sodium heart healthy    Complete by:  As directed      Increase activity slowly    Complete by:  As directed             Medication List         acetaminophen 650 MG CR tablet  Commonly known as:  TYLENOL  Take 1,300 mg by mouth every 8 (eight) hours as needed for pain.     acyclovir ointment 5 %  Commonly known as:  ZOVIRAX  Apply topically every 3 (three) hours. Please continue for 2 weeks.     aspirin EC 81 MG tablet  Take 81 mg by mouth daily.     bisoprolol-hydrochlorothiazide 2.5-6.25 MG per tablet  Commonly known as:  ZIAC  Take 1 tablet by mouth daily.     hydroxypropyl methylcellulose 2.5 % ophthalmic solution  Commonly known as:  ISOPTO TEARS  Place 1 drop into both eyes 3 (three) times daily as needed for dry eyes.     PRESCRIPTION MEDICATION  Inject 1 application as directed every 30 (thirty) days. Pt gets eyes injections through [facility]     Pseudoephedrine-APAP-DM 30-325-15 MG Caps  Take 2 capsules by mouth every 6 (six) hours as needed (cough).     Vitamin D (Ergocalciferol) 50000 UNITS Caps capsule  Commonly known as:  DRISDOL  Take 50,000 Units by mouth every Thursday.            Follow-up Information   Follow up with [facility]. (home  health physical and occupational therapy, nurse and aide)    Contact information:   [address] [place] [address] [phone]       Follow up with [place]     On 04/14/2014. (2 pm)    Contact information:   [place] [address] [phone]       The results of significant diagnostics from this hospitalization (including imaging, microbiology, ancillary and laboratory) are listed below for reference.    Significant Diagnostic Studies: Dg Chest Port 1 View  (if Code Sepsis Called) 03/22/2014     IMPRESSION: No acute findings.  Low lung volumes.      Microbiology: URINE CULTURE     Status: None   Collection  Time    03/22/14  7:44 PM      Result Value Ref Range Status   Specimen Description URINE, CATHETERIZED   Final   Value: ESCHERICHIA COLI     Performed at [facility]   Report Status 03/25/2014 FINAL   Final   Organism ID, Bacteria ESCHERICHIA COLI   Final  CULTURE, BLOOD (ROUTINE X 2)     Status: None   Collection Time    03/22/14  7:50 PM      Result Value Ref Range Status   Specimen Description BLOOD L FOREARM   Final   Value: GROUP B STREP(S.AGALACTIAE)ISOLATED     Note: SUSCEPTIBILITIES PERFORMED ON PREVIOUS CULTURE WITHIN THE LAST 5 DAYS.     Note: Gram Stain Report Called to,Read Back By and Verified With: MEGAN STOCKS 03/23/14 1515 BY SMITHERSJ     Performed at [facility]   Report Status 03/25/2014 FINAL   Final  CULTURE, BLOOD (ROUTINE X 2)     Status: None   Collection Time    03/22/14  7:55 PM      Result Value Ref Range Status   Specimen Description BLOOD R HAND   Final   Value: GROUP B STREP(S.AGALACTIAE)ISOLATED     Note: Gram Stain Report Called to,Read Back By and Verified With: MEGAN STOCKS 03/23/14 1515 BY SMITHERSJ     Performed at [facility]   Report Status 03/25/2014 FINAL   Final   Organism ID, Bacteria GROUP B STREP(S.AGALACTIAE)ISOLATED   Final   MRSA PCR SCREENING     Status: None   Collection Time    03/22/14 10:42 PM      Result Value Ref Range Status   MRSA by PCR NEGATIVE  NEGATIVE Final  CULTURE, BLOOD (ROUTINE X 2)     Status: None   Collection Time    03/25/14 12:15 PM      Result Value Ref Range Status   Specimen Description BLOOD RIGHT ARM   Final   Value:        BLOOD CULTURE RECEIVED NO GROWTH TO DATE CULTURE WILL BE HELD FOR 5 DAYS BEFORE ISSUING A FINAL NEGATIVE REPORT     Performed at [facility]   Report Status PENDING   Incomplete  CULTURE, BLOOD (ROUTINE X 2)     Status: None   Collection Time    03/25/14 12:25 PM      Result Value Ref Range Status   Specimen Description BLOOD LEFT ARM   Final   Value:        BLOOD CULTURE RECEIVED NO GROWTH TO DATE CULTURE WILL BE HELD FOR 5 DAYS BEFORE ISSUING A FINAL NEGATIVE REPORT     Performed at [facility]   Report Status PENDING   Incomplete     Labs: Basic Metabolic Panel:  Recent Labs Lab 03/22/14 1950 03/23/14 0040 03/24/14 0403 03/25/14 0353  NA 132* 137 135* 137  K 3.5* 3.0* 3.3* 4.2  CL 95* 102 101 103  CO2 '19 22 20 25  ' GLUCOSE 181* 163* 120* 130*  BUN 35* 26* 16 12  CREATININE 1.22* 0.92 0.69 0.67  CALCIUM 8.7 8.1* 8.1* 8.2*  MG  --  2.1  --   --    Liver Function Tests:  Recent Labs Lab 03/22/14 1950  AST 45*  ALT 28  ALKPHOS 55  BILITOT 0.5  PROT 7.0  ALBUMIN 3.4*   No results found for this basename: LIPASE, AMYLASE,  in the last 168 hours No results found for this basename: AMMONIA,  in the last 168 hours CBC:  Recent Labs Lab 03/22/14 1950 03/23/14 0040 03/24/14 0403 03/25/14 0353  WBC 22.4* 25.8* 17.8* 14.6*  HGB 11.4* 10.6* 10.5* 9.9*  HCT 33.3* 31.5* 31.0* 30.2*  MCV 88.1 89.0 89.6 89.3  PLT 199 181 184 215   Cardiac Enzymes:  Recent Labs Lab 03/22/14 1950  CKTOTAL 881*  CKMB 3.3   BNP: BNP (last 3 results) No results found for this basename: PROBNP,  in the last 8760 hours CBG: No  results found for this basename: GLUCAP,  in the last 168 hours  Time coordinating discharge: Over 30 minutes

## 2014-03-26 NOTE — Progress Notes (Signed)
Nursing Discharge Summary  Patient ID: Dawn Lawson MRN: [id number] DOB/AGE: [id number] y.o.  Admit date: 03/22/2014 Discharge date: 03/26/2014  Discharged Condition: good  Disposition: 01-Home or Self Care  Follow-up Information   Follow up with [facility]. (home health physical and occupational therapy, nurse to administer IV antiobiotics and aide)    Contact information:   [address] [place] [address] [phone]       Follow up with [facility]     On 04/14/2014. (2 pm)    Contact information:   [place] [address] [phone]      Prescriptions Given: Zovirax  Means of Discharge: DC instructions shared with patient and daughter. [place] home via wheelchair to car with daughter to home.   Signed: Juanetta Snow 03/26/2014, 2:10 PM

## 2014-03-26 NOTE — Progress Notes (Signed)
Peripherally Inserted Central Catheter/Midline Placement  The IV Nurse has discussed with the patient and/or persons authorized to consent for the patient, the purpose of this procedure and the potential benefits and risks involved with this procedure.  The benefits include less needle sticks, lab draws from the catheter and patient may be discharged home with the catheter.  Risks include, but not limited to, infection, bleeding, blood clot (thrombus formation), and puncture of an artery; nerve damage and irregular heat beat.  Alternatives to this procedure were also discussed.  PICC/Midline Placement Documentation        Dawn Lawson 03/26/2014, 12:10 PM

## 2014-03-26 NOTE — Progress Notes (Signed)
[facility]  [facility] is providing the following services: RW  If patient discharges after hours, please call [phone].   Linward Headland 03/26/2014, 9:01 AM

## 2014-03-27 DIAGNOSIS — N390 Urinary tract infection, site not specified: Secondary | ICD-10-CM | POA: Diagnosis not present

## 2014-03-27 DIAGNOSIS — Z452 Encounter for adjustment and management of vascular access device: Secondary | ICD-10-CM | POA: Diagnosis not present

## 2014-03-27 DIAGNOSIS — R5381 Other malaise: Secondary | ICD-10-CM | POA: Diagnosis not present

## 2014-03-27 DIAGNOSIS — A498 Other bacterial infections of unspecified site: Secondary | ICD-10-CM | POA: Diagnosis not present

## 2014-03-27 DIAGNOSIS — A419 Sepsis, unspecified organism: Secondary | ICD-10-CM | POA: Diagnosis not present

## 2014-03-28 DIAGNOSIS — Z452 Encounter for adjustment and management of vascular access device: Secondary | ICD-10-CM | POA: Diagnosis not present

## 2014-03-28 DIAGNOSIS — R5381 Other malaise: Secondary | ICD-10-CM | POA: Diagnosis not present

## 2014-03-28 DIAGNOSIS — A498 Other bacterial infections of unspecified site: Secondary | ICD-10-CM | POA: Diagnosis not present

## 2014-03-28 DIAGNOSIS — A419 Sepsis, unspecified organism: Secondary | ICD-10-CM | POA: Diagnosis not present

## 2014-03-28 DIAGNOSIS — N390 Urinary tract infection, site not specified: Secondary | ICD-10-CM | POA: Diagnosis not present

## 2014-03-30 DIAGNOSIS — R5381 Other malaise: Secondary | ICD-10-CM | POA: Diagnosis not present

## 2014-03-30 DIAGNOSIS — A498 Other bacterial infections of unspecified site: Secondary | ICD-10-CM | POA: Diagnosis not present

## 2014-03-30 DIAGNOSIS — A419 Sepsis, unspecified organism: Secondary | ICD-10-CM | POA: Diagnosis not present

## 2014-03-30 DIAGNOSIS — Z452 Encounter for adjustment and management of vascular access device: Secondary | ICD-10-CM | POA: Diagnosis not present

## 2014-03-30 DIAGNOSIS — N390 Urinary tract infection, site not specified: Secondary | ICD-10-CM | POA: Diagnosis not present

## 2014-03-30 DIAGNOSIS — R7881 Bacteremia: Secondary | ICD-10-CM | POA: Diagnosis not present

## 2014-03-31 DIAGNOSIS — N390 Urinary tract infection, site not specified: Secondary | ICD-10-CM | POA: Diagnosis not present

## 2014-03-31 DIAGNOSIS — A419 Sepsis, unspecified organism: Secondary | ICD-10-CM | POA: Diagnosis not present

## 2014-03-31 DIAGNOSIS — A498 Other bacterial infections of unspecified site: Secondary | ICD-10-CM | POA: Diagnosis not present

## 2014-03-31 DIAGNOSIS — R5381 Other malaise: Secondary | ICD-10-CM | POA: Diagnosis not present

## 2014-03-31 DIAGNOSIS — Z452 Encounter for adjustment and management of vascular access device: Secondary | ICD-10-CM | POA: Diagnosis not present

## 2014-03-31 LAB — CULTURE, BLOOD (ROUTINE X 2)
Culture: NO GROWTH
Culture: NO GROWTH

## 2014-04-02 ENCOUNTER — Telehealth: Payer: Self-pay | Admitting: *Deleted

## 2014-04-02 ENCOUNTER — Telehealth: Payer: Self-pay | Admitting: Internal Medicine

## 2014-04-02 ENCOUNTER — Encounter: Payer: Self-pay | Admitting: *Deleted

## 2014-04-02 DIAGNOSIS — N390 Urinary tract infection, site not specified: Secondary | ICD-10-CM | POA: Diagnosis not present

## 2014-04-02 DIAGNOSIS — R5381 Other malaise: Secondary | ICD-10-CM | POA: Diagnosis not present

## 2014-04-02 DIAGNOSIS — A498 Other bacterial infections of unspecified site: Secondary | ICD-10-CM | POA: Diagnosis not present

## 2014-04-02 DIAGNOSIS — Z452 Encounter for adjustment and management of vascular access device: Secondary | ICD-10-CM | POA: Diagnosis not present

## 2014-04-02 DIAGNOSIS — A419 Sepsis, unspecified organism: Secondary | ICD-10-CM | POA: Diagnosis not present

## 2014-04-02 NOTE — Telephone Encounter (Signed)
Daughter-in-law Marliss Czar is calling b/c pt was discharged from the hospital on 03/26/14 by Dr. Neil Crouch & given instructions to call our office about scheduling a TEE to look at possible vegetation on pt heart valves.  She calls today b/c no one has called them with an appt time for this procedure. States that it would be best to call the son, Quillian Quince at [phone] & we can leave a message of the date & time of the procedure  I spoke with son & have scheduled pt for 04/09/14 10:00am with Dr. Stanford Breed (rdr B) for TEE Son states [facility] is drawing lab work & will have them fax our office a copy of current: bmp, cbc, & PT/INR  TEE Instructions mailed to pt Horton Chin RN

## 2014-04-02 NOTE — Telephone Encounter (Signed)
Spoke with Regino Schultze at Dr. Fortino Sic office. Asked for them to have Dr. Charlies Silvers place the order for the TEE & we have scheduled it for pt. Dawn Lawson sent electronic message for Dr. Charlies Silvers to place the order. Horton Chin RN

## 2014-04-02 NOTE — Telephone Encounter (Signed)
Pt's case manager has called in today to see if she can get an order put in for a TEE; please f/u at your earliest convenience

## 2014-04-04 DIAGNOSIS — N390 Urinary tract infection, site not specified: Secondary | ICD-10-CM | POA: Diagnosis not present

## 2014-04-04 DIAGNOSIS — A419 Sepsis, unspecified organism: Secondary | ICD-10-CM | POA: Diagnosis not present

## 2014-04-04 DIAGNOSIS — R5381 Other malaise: Secondary | ICD-10-CM | POA: Diagnosis not present

## 2014-04-04 DIAGNOSIS — Z452 Encounter for adjustment and management of vascular access device: Secondary | ICD-10-CM | POA: Diagnosis not present

## 2014-04-04 DIAGNOSIS — A498 Other bacterial infections of unspecified site: Secondary | ICD-10-CM | POA: Diagnosis not present

## 2014-04-06 ENCOUNTER — Encounter (HOSPITAL_COMMUNITY): Payer: Self-pay | Admitting: Pharmacy Technician

## 2014-04-06 DIAGNOSIS — A498 Other bacterial infections of unspecified site: Secondary | ICD-10-CM | POA: Diagnosis not present

## 2014-04-06 DIAGNOSIS — Z452 Encounter for adjustment and management of vascular access device: Secondary | ICD-10-CM | POA: Diagnosis not present

## 2014-04-06 DIAGNOSIS — A419 Sepsis, unspecified organism: Secondary | ICD-10-CM | POA: Diagnosis not present

## 2014-04-06 DIAGNOSIS — R5381 Other malaise: Secondary | ICD-10-CM | POA: Diagnosis not present

## 2014-04-06 DIAGNOSIS — N390 Urinary tract infection, site not specified: Secondary | ICD-10-CM | POA: Diagnosis not present

## 2014-04-06 DIAGNOSIS — Z5181 Encounter for therapeutic drug level monitoring: Secondary | ICD-10-CM | POA: Diagnosis not present

## 2014-04-07 DIAGNOSIS — H353290 Exudative age-related macular degeneration, unspecified eye, stage unspecified: Secondary | ICD-10-CM | POA: Diagnosis not present

## 2014-04-08 ENCOUNTER — Telehealth: Payer: Self-pay | Admitting: Cardiology

## 2014-04-08 NOTE — Telephone Encounter (Signed)
New Message  Pt called requests a call back to determine if the lab results were in, Please call

## 2014-04-08 NOTE — Telephone Encounter (Signed)
Spoke with pt son, questions regarding TEE and lab work answered.

## 2014-04-09 ENCOUNTER — Encounter (HOSPITAL_COMMUNITY)
Admission: RE | Disposition: A | Discharge status: Home / Self Care | Payer: Medicare Other | Source: Ambulatory Visit | Attending: Cardiology

## 2014-04-09 ENCOUNTER — Ambulatory Visit (HOSPITAL_COMMUNITY)
Admission: RE | Admit: 2014-04-09 | Discharge: 2014-04-09 | Disposition: A | Discharge status: Home / Self Care | Payer: Medicare Other | Source: Ambulatory Visit | Attending: Cardiology | Admitting: Cardiology

## 2014-04-09 ENCOUNTER — Encounter (HOSPITAL_COMMUNITY): Payer: Self-pay | Admitting: Gastroenterology

## 2014-04-09 DIAGNOSIS — I339 Acute and subacute endocarditis, unspecified: Secondary | ICD-10-CM | POA: Diagnosis present

## 2014-04-09 DIAGNOSIS — Q211 Atrial septal defect: Secondary | ICD-10-CM | POA: Diagnosis not present

## 2014-04-09 DIAGNOSIS — I080 Rheumatic disorders of both mitral and aortic valves: Secondary | ICD-10-CM | POA: Insufficient documentation

## 2014-04-09 DIAGNOSIS — Q2111 Secundum atrial septal defect: Secondary | ICD-10-CM | POA: Insufficient documentation

## 2014-04-09 DIAGNOSIS — I359 Nonrheumatic aortic valve disorder, unspecified: Secondary | ICD-10-CM | POA: Diagnosis not present

## 2014-04-09 HISTORY — PX: TEE WITHOUT CARDIOVERSION: SHX5443

## 2014-04-09 SURGERY — ECHOCARDIOGRAM, TRANSESOPHAGEAL
Anesthesia: Moderate Sedation

## 2014-04-09 MED ORDER — FENTANYL CITRATE 0.05 MG/ML IJ SOLN
INTRAMUSCULAR | Status: AC
  Filled 2014-04-09: qty 2

## 2014-04-09 MED ORDER — SODIUM CHLORIDE 0.9 % IV SOLN
INTRAVENOUS | Status: DC
  Administered 2014-04-09: 500 mL via INTRAVENOUS

## 2014-04-09 MED ORDER — FENTANYL CITRATE 0.05 MG/ML IJ SOLN
INTRAMUSCULAR | Status: DC | PRN
  Administered 2014-04-09 (×2): 25 ug via INTRAVENOUS

## 2014-04-09 MED ORDER — BUTAMBEN-TETRACAINE-BENZOCAINE 2-2-14 % EX AERO
INHALATION_SPRAY | CUTANEOUS | Status: DC | PRN
  Administered 2014-04-09 (×2): 1 via TOPICAL

## 2014-04-09 MED ORDER — HEPARIN SOD (PORK) LOCK FLUSH 100 UNIT/ML IV SOLN
250.0000 [IU] | INTRAVENOUS | Status: AC | PRN
  Administered 2014-04-09: 250 [IU]

## 2014-04-09 MED ORDER — MIDAZOLAM HCL 5 MG/ML IJ SOLN
INTRAMUSCULAR | Status: AC
  Filled 2014-04-09: qty 1

## 2014-04-09 MED ORDER — MIDAZOLAM HCL 10 MG/2ML IJ SOLN
INTRAMUSCULAR | Status: DC | PRN
  Administered 2014-04-09 (×2): 2 mg via INTRAVENOUS

## 2014-04-09 NOTE — Discharge Instructions (Signed)
Transesophageal Echocardiogram °Transesophageal echocardiography (TEE) is a special type of test that produces images of the heart by using sound waves (echocardiogram). This type of echocardiography can obtain better images of the heart than standard echocardiography. TEE is done by passing a flexible tube down the esophagus. The heart is located in front of the esophagus. Because the heart and esophagus are close to one another, your health care provider can take very clear, detailed pictures of the heart via ultrasound waves. °TEE may be done: °· If your health care provider needs more information based on standard echocardiography findings. °· If you had a stroke. This might have happened because a clot formed in your heart. TEE can visualize different areas of the heart and check for clots. °· To check valve anatomy and function. °· To check for infection on the inside of your heart (endocarditis). °· To evaluate the dividing wall (septum) of the heart and presence of a hole that did not close after birth (patent foramen ovale or atrial septal defect). °· To help diagnose a tear in the wall of the aorta (aortic dissection). °· During cardiac valve surgery. This allows the surgeon to assess the valve repair before closing the chest. °· During a variety of other cardiac procedures to guide positioning of catheters. °· Sometimes before a cardioversion, which is a shock to convert heart rhythm back to normal. °LET YOUR HEALTH CARE PROVIDER KNOW ABOUT:  °· Any allergies you have. °· All medicines you are taking, including vitamins, herbs, eye drops, creams, and over-the-counter medicines. °· Previous problems you or members of your family have had with the use of anesthetics. °· Any blood disorders you have. °· Previous surgeries you have had. °· Medical conditions you have. °· Swallowing difficulties. °· An esophageal obstruction. °RISKS AND COMPLICATIONS  °Generally, TEE is a safe procedure. However, as with any  procedure, complications can occur. Possible complications include an esophageal tear (rupture). °BEFORE THE PROCEDURE  °· Do not eat or drink for 6 hours before the procedure or as directed by your health care provider. °· Arrange for someone to drive you home after the procedure. Do not drive yourself home. During the procedure, you will be given medicines that can continue to make you feel drowsy and can impair your reflexes. °· An IV access tube will be started in the arm. °PROCEDURE  °· A medicine to help you relax (sedative) will be given through the IV access tube. °· A medicine may be sprayed or gargled to numb the back of the throat. °· Your blood pressure, heart rate, and breathing (vital signs) will be monitored during the procedure. °· The TEE probe is a long, flexible tube. The tip of the probe is placed into the back of the mouth, and you will be asked to swallow. This helps to pass the tip of the probe into the esophagus. Once the tip of the probe is in the correct area, your health care provider can take pictures of the heart. °· TEE is usually not a painful procedure. You may feel the probe press against the back of the throat. The probe does not enter the trachea and does not affect your breathing. °AFTER THE PROCEDURE  °· You will be in bed, resting, until you have fully returned to consciousness. °· When you first awaken, your throat may feel slightly sore and will probably still feel numb. This will improve slowly over time. °· You will not be allowed to eat or drink until it   is clear that the numbness has improved. °· Once you have been able to drink, urinate, and sit on the edge of the bed without feeling sick to your stomach (nausea) or dizzy, you may be cleared to go home. °· You should have a friend or family member with you for the next 24 hours after your procedure. °Document Released: 12/30/2002 Document Revised: 10/14/2013 Document Reviewed: 04/10/2013 °[employer]® Patient Information  ©2015 [employer]. This information is not intended to replace advice given to you by your health care provider. Make sure you discuss any questions you have with your health care provider. ° °

## 2014-04-09 NOTE — CV Procedure (Signed)
See full TEE report in [facility]. No vegetations noted. Kirk Ruths

## 2014-04-09 NOTE — Progress Notes (Signed)
Patient Information    Patient Name Sex DOB SSN    Dawn Lawson, Dawn Lawson Female [birth year] [id number]       Discharge Summaries by Robbie Lis, MD at 03/26/2014 11:33 AM    Author: Robbie Lis, MD Service: Internal Medicine Author Type: Physician    Filed: 03/26/2014  4:35 PM Note Time: 03/26/2014 11:33 AM Status: Addendum    Editor: Robbie Lis, MD (Physician)        Related Notes: Original Note by Robbie Lis, MD (Physician) filed at 03/26/2014 11:37 AM       Physician Discharge Summary    Dawn Lawson:[id number] DOB: Jan 18, 1929 DOA: 03/22/2014   PCP: No primary [place] on file.   Admit date: 03/22/2014 Discharge date: 03/26/2014   Recommendations for Outpatient Follow-up:   1. Continue vancomycin total of 13 days on discharge for treatment of group B strep bacteremia. Dosing of vancomycin per [facility], blood work includes checking vanco level at least every 3 days to make sure it is WNL. Blood cultures were repeated on 03/25/2014 and they were negative. Please note that the treatment is for blood cultures which were done 03/22/2014 which grew group B streptococcus. 2. Cardiology knows the number to call to schedule TEE as we discussed to explore for possible heart vegetations. They will call you to schedule appt early next week.   3. Please follow up in [facility] 04-14-2014 at 2 pm. Phone number [phone].   Discharge Diagnoses:   Principal Problem:   Severe sepsis with acute organ dysfunction Active Problems:   UTI (lower urinary tract infection)   AKI (acute kidney injury)   ATN (acute tubular necrosis)   Lactic acidosis   Urinary tract infection   Cellulitis of right leg       Discharge Condition: stable    Diet recommendation: as tolerated    History of present illness:   80 y.o. female with no significant past medical history, leaves alone at home who presented to [facility] ED 03/22/2014 with generalized weakness and confusion. On admission, patient was  found to have a fever of 104.8 F, white blood cell count of 22 and acute renal failure with creatinine of 1.22. Patient was initially admitted to step down unit for treatment of possible sepsis. She was found to have right lower extremity cellulitis She was started on IV vancomycin and Levaquin on admission. Hospital course is complicated due to additional findings of Escherichia coli UTI and group B strep bacteremia.    Assessment/Plan:    Principal Problem:   Sepsis/group B strep bacteremia/E. coli UTI/right lower extremity cellulitis  The patient met criteria of severe sepsis on admission considering fever of 104.8 F, elevated white blood cell count of 22, elevated procalcitonin level at 38.76 and evidence of right lower extremity cellulitis. Further, she was found to have Escherichia coli UTI and group B strep bacteremia.   Patient was initially on IV vancomycin and Levaquin. Based on blood cultures obtained on the admission, group B strep is sensitive to multiple antibiotics including Levaquin. We discontinued vancomycin 6/3 but continued Levaquin. Per ID we can continue Vancomycin for 13 more days on discharge. Active problems:   Acute renal failure  Likely prerenal etiology, urinary tract infection and dehydration   Creatinine resolved to normal with IV fluids Hypokalemia  Repleted. Potassium is within normal limits  Leukocytosis  Secondary to sepsis, bacteremia, UTI, cellulitis   White blood cell count trended down to  14.6  Consultants:  None  Procedures:  None  Antibiotics:  Vancomycin 5/31 --> 03/25/2014   Levaquin 5/31 --> 03/26/2014 Vancomycin on discharge     DVT prophylaxis: heparin subcutaneous      Code Status: full code   Family Communication: plan of care discussed with the patient and her family at the bedside       Signed:   Robbie Lis, MD     [facility] 03/26/2014, 11:33 AM   Pager #: [phone]       Discharge  Exam: Filed Vitals:     03/26/14 0645   BP:  156/58   Pulse:  79   Temp:  99.5 F (37.5 C)   Resp:  18       Filed Vitals:     03/25/14 0631  03/25/14 1332  03/25/14 2108  03/26/14 0645   BP:  168/57  178/65  153/52  156/58   Pulse:  95  85  75  79   Temp:  99.8 F (37.7 C)  98.8 F (37.1 C)  99.8 F (37.7 C)  99.5 F (37.5 C)   TempSrc:  Oral  Oral  Oral  Oral   Resp:  '18  18  16  18   ' Height:           Weight:           SpO2:  95%  97%  95%  98%        General: Pt is alert, follows commands appropriately, not in acute distress Cardiovascular: Regular rate and rhythm, S1/S2 +, no murmurs Respiratory: Clear to auscultation bilaterally, no wheezing, no crackles, no rhonchi Abdominal: Soft, non tender, non distended, bowel sounds +, no guarding Extremities: no edema, no cyanosis, pulses palpable bilaterally DP and PT Neuro: Grossly nonfocal   Discharge Instructions    Discharge Instructions     Call MD for:  difficulty breathing, headache or visual disturbances     Complete by:  As directed          Call MD for:  persistant dizziness or light-headedness     Complete by:  As directed          Call MD for:  persistant nausea and vomiting     Complete by:  As directed          Call MD for:  severe uncontrolled pain     Complete by:  As directed          Diet - low sodium heart healthy     Complete by:  As directed          Increase activity slowly     Complete by:  As directed                       Medication List              acetaminophen 650 MG CR tablet   Commonly known as:  TYLENOL   Take 1,300 mg by mouth every 8 (eight) hours as needed for pain.         acyclovir ointment 5 %   Commonly known as:  ZOVIRAX   Apply topically every 3 (three) hours. Please continue for 2 weeks.         aspirin EC 81 MG tablet   Take 81 mg by mouth daily.         bisoprolol-hydrochlorothiazide 2.5-6.25 MG per tablet   Commonly known as:  ZIAC   Take 1 tablet by  mouth daily.         hydroxypropyl methylcellulose 2.5 % ophthalmic solution   Commonly known as:  ISOPTO TEARS   Place 1 drop into both eyes 3 (three) times daily as needed for dry eyes.         PRESCRIPTION MEDICATION   Inject 1 application as directed every 30 (thirty) days. Pt gets eyes injections through [facility]         Pseudoephedrine-APAP-DM 30-325-15 MG Caps   Take 2 capsules by mouth every 6 (six) hours as needed (cough).         Vitamin D (Ergocalciferol) 50000 UNITS Caps capsule   Commonly known as:  DRISDOL   Take 50,000 Units by mouth every Thursday.                      Follow-up Information     Follow up with [facility]. (home health physical and occupational therapy, nurse and aide)      Contact information:     [address] [place] [address] [phone]           Follow up with [facility]     On 04/14/2014. (2 pm)      Contact information:     [place] [address] [phone]            --------------------------------------------------------------------------------   The results of significant diagnostics from this hospitalization (including imaging, microbiology, ancillary and laboratory) are listed below for reference.       Significant Diagnostic Studies: Dg Chest Port 1 View  (if Code Sepsis Called) 03/22/2014     IMPRESSION: No acute findings.  Low lung volumes.        Microbiology: URINE CULTURE     Status: None     Collection Time      03/22/14  7:44 PM       Result  Value  Ref Range  Status     Specimen Description  URINE, CATHETERIZED     Final     Value:  ESCHERICHIA COLI        Performed at [facility]     Report Status  03/25/2014 FINAL     Final     Organism ID, Bacteria  ESCHERICHIA COLI     Final   CULTURE, BLOOD (ROUTINE X 2)     Status: None     Collection Time      03/22/14  7:50 PM       Result  Value   Ref Range  Status     Specimen Description  BLOOD L FOREARM     Final     Value:  GROUP B STREP(S.AGALACTIAE)ISOLATED        Note: SUSCEPTIBILITIES PERFORMED ON PREVIOUS CULTURE WITHIN THE LAST 5 DAYS.        Note: Gram Stain Report Called to,Read Back By and Verified With: MEGAN STOCKS 03/23/14 1515 BY SMITHERSJ        Performed at [facility]     Report Status  03/25/2014 FINAL     Final   CULTURE, BLOOD (ROUTINE X 2)     Status: None     Collection Time      03/22/14  7:55 PM       Result  Value  Ref Range  Status     Specimen Description  BLOOD R HAND     Final     Value:  GROUP B STREP(S.AGALACTIAE)ISOLATED        Note: Gram Stain Report Called to,Read Back By and Verified With: MEGAN STOCKS 03/23/14 1515 BY SMITHERSJ        Performed at [facility]     Report Status  03/25/2014 FINAL     Final     Organism ID, Bacteria  GROUP B STREP(S.AGALACTIAE)ISOLATED     Final   MRSA PCR SCREENING     Status: None     Collection Time      03/22/14 10:42 PM       Result  Value  Ref Range  Status     MRSA by PCR  NEGATIVE   NEGATIVE  Final   CULTURE, BLOOD (ROUTINE X 2)     Status: None     Collection Time      03/25/14 12:15 PM       Result  Value  Ref Range  Status     Specimen Description  BLOOD RIGHT ARM     Final     Value:         BLOOD CULTURE RECEIVED NO GROWTH TO DATE CULTURE WILL BE HELD FOR 5 DAYS BEFORE ISSUING A FINAL NEGATIVE REPORT        Performed at [facility]     Report Status  PENDING     Incomplete   CULTURE, BLOOD (ROUTINE X 2)     Status: None     Collection Time      03/25/14 12:25 PM       Result  Value  Ref Range  Status     Specimen Description  BLOOD LEFT ARM     Final     Value:         BLOOD CULTURE RECEIVED NO GROWTH TO DATE CULTURE WILL BE HELD FOR 5 DAYS BEFORE ISSUING A FINAL NEGATIVE REPORT        Performed at [facility]     Report Status  PENDING     Incomplete        Labs: Basic Metabolic Panel:   Recent  Labs Lab  03/22/14 1950  03/23/14 0040  03/24/14 0403  03/25/14 0353   NA  132*  137  135*  137   K  3.5*  3.0*  3.3*  4.2   CL  95*  102  101  103   CO2  '19  22  20  25   ' GLUCOSE  181*  163*  120*  130*   BUN  35*  26*  16  12   CREATININE  1.22*  0.92  0.69  0.67   CALCIUM  8.7  8.1*  8.1*  8.2*   MG   --   2.1   --    --       Liver Function Tests:   Recent Labs Lab  03/22/14 1950   AST  45*   ALT  28   ALKPHOS  55   BILITOT  0.5   PROT  7.0   ALBUMIN  3.4*      No results found for this basename: LIPASE, AMYLASE,  in the last 168 hours No results found for this basename: AMMONIA,  in the last 168 hours CBC:   Recent Labs Lab  03/22/14 1950  03/23/14 0040  03/24/14 0403  03/25/14 0353   WBC  22.4*  25.8*  17.8*  14.6*   HGB  11.4*  10.6*  10.5*  9.9*   HCT  33.3*  31.5*  31.0*  30.2*   MCV  88.1  89.0  89.6  89.3   PLT  199  181  184  215      Cardiac Enzymes:   Recent Labs Lab  03/22/14 1950   CKTOTAL  881*   CKMB  3.3      BNP: BNP (last 3 results) No results found for this basename: PROBNP,  in the last 8760 hours CBG: No results found for this basename: GLUCAP,  in the last 168 hours   Time coordinating discharge: Over 30 minutes  For TEE; no changes. Kirk Ruths

## 2014-04-09 NOTE — H&P (Signed)
Lelon Perla, MD Physician Signed Cardiology Progress Notes Service date: 04/09/2014 8:36 AM   Patient Information  Patient Name Sex DOB SSN  Dawn Lawson, Dawn Lawson Female 1929/09/09 [id number]  Discharge Summaries by Robbie Lis, MD at 03/26/2014 11:33 AM  Author: Robbie Lis, MD Service: Internal Medicine Author Type: Physician  Filed: 03/26/2014 4:35 PM Note Time: 03/26/2014 11:33 AM Status: Addendum  Editor: Robbie Lis, MD (Physician)  Related Notes: Original Note by Robbie Lis, MD (Physician) filed at 03/26/2014 11:37 AM  Physician Discharge Summary  Dawn Lawson VPX:[id number] DOB: [birth year] DOA: 03/22/2014  PCP: No primary [facility] on file.  Admit date: 03/22/2014  Discharge date: 03/26/2014  Recommendations for Outpatient Follow-up:  1. Continue vancomycin total of 13 days on discharge for treatment of group B strep bacteremia. Dosing of vancomycin per [facility], blood work includes checking vanco level at least every 3 days to make sure it is WNL. Blood cultures were repeated on 03/25/2014 and they were negative. Please note that the treatment is for blood cultures which were done 03/22/2014 which grew group B streptococcus.  2. Cardiology knows the number to call to schedule TEE as we discussed to explore for possible heart vegetations. They will call you to schedule appt early next week.  3. Please follow up in [facility] 04-14-2014 at 2 pm. Phone number [phone].  Discharge Diagnoses:  Principal Problem:  Severe sepsis with acute organ dysfunction  Active Problems:  UTI (lower urinary tract infection)  AKI (acute kidney injury)  ATN (acute tubular necrosis)  Lactic acidosis  Urinary tract infection  Cellulitis of right leg  Discharge Condition: stable  Diet recommendation: as tolerated  History of present illness:  80 y.o. female with no significant past medical history, leaves alone at home who presented to [facility] ED 03/22/2014 with generalized weakness and confusion. On  admission, patient was found to have a fever of 104.8 F, white blood cell count of 22 and acute renal failure with creatinine of 1.22. Patient was initially admitted to step down unit for treatment of possible sepsis. She was found to have right lower extremity cellulitis She was started on IV vancomycin and Levaquin on admission. Hospital course is complicated due to additional findings of Escherichia coli UTI and group B strep bacteremia.  Assessment/Plan:  Principal Problem:  Sepsis/group B strep bacteremia/E. coli UTI/right lower extremity cellulitis  The patient met criteria of severe sepsis on admission considering fever of 104.8 F, elevated white blood cell count of 22, elevated procalcitonin level at 38.76 and evidence of right lower extremity cellulitis. Further, she was found to have Escherichia coli UTI and group B strep bacteremia.  Patient was initially on IV vancomycin and Levaquin. Based on blood cultures obtained on the admission, group B strep is sensitive to multiple antibiotics including Levaquin. We discontinued vancomycin 6/3 but continued Levaquin.  Per ID we can continue Vancomycin for 13 more days on discharge.  Active problems:  Acute renal failure  Likely prerenal etiology, urinary tract infection and dehydration  Creatinine resolved to normal with IV fluids  Hypokalemia  Repleted. Potassium is within normal limits  Leukocytosis  Secondary to sepsis, bacteremia, UTI, cellulitis  White blood cell count trended down to 14.6  Consultants:  None  Procedures:  None  Antibiotics:  Vancomycin 5/31 --> 03/25/2014  Levaquin 5/31 --> 03/26/2014  Vancomycin on discharge  DVT prophylaxis: heparin subcutaneous  Code Status: full code  Family Communication: plan of care discussed with the  patient and her family at the bedside  Signed:  Robbie Lis, MD  [facility]  03/26/2014, 11:33 AM  Pager #: [phone]  Discharge Exam:  Filed Vitals:  03/26/14 0645  BP: 156/58   Pulse: 79  Temp: 99.5 F (37.5 C)  Resp: 18  Filed Vitals:  03/25/14 0631 03/25/14 1332 03/25/14 2108 03/26/14 0645  BP: 168/57 178/65 153/52 156/58  Pulse: 95 85 75 79  Temp: 99.8 F (37.7 C) 98.8 F (37.1 C) 99.8 F (37.7 C) 99.5 F (37.5 C)  TempSrc: Oral Oral Oral Oral  Resp: _0 Height:  Weight:  SpO2: 95% 97% 95% 98%  General: Pt is alert, follows commands appropriately, not in acute distress  Cardiovascular: Regular rate and rhythm, S1/S2 +, no murmurs  Respiratory: Clear to auscultation bilaterally, no wheezing, no crackles, no rhonchi  Abdominal: Soft, non tender, non distended, bowel sounds +, no guarding  Extremities: no edema, no cyanosis, pulses palpable bilaterally DP and PT  Neuro: Grossly nonfocal  Discharge Instructions  Discharge Instructions  Call MD for: difficulty breathing, headache or visual disturbances Complete by: As directed  Call MD for: persistant dizziness or light-headedness Complete by: As directed  Call MD for: persistant nausea and vomiting Complete by: As directed  Call MD for: severe uncontrolled pain Complete by: As directed  Diet - low sodium heart healthy Complete by: As directed  Increase activity slowly Complete by: As directed  Medication List  acetaminophen 650 MG CR tablet  Commonly known as: TYLENOL  Take 1,300 mg by mouth every 8 (eight) hours as needed for pain.  acyclovir ointment 5 %  Commonly known as: ZOVIRAX  Apply topically every 3 (three) hours. Please continue for 2 weeks.  aspirin EC 81 MG tablet  Take 81 mg by mouth daily.  bisoprolol-hydrochlorothiazide 2.5-6.25 MG per tablet  Commonly known as: ZIAC  Take 1 tablet by mouth daily.  hydroxypropyl methylcellulose 2.5 % ophthalmic solution  Commonly known as: ISOPTO TEARS  Place 1 drop into both eyes 3 (three) times daily as needed for dry eyes.  PRESCRIPTION MEDICATION  Inject 1 application as directed every 30 (thirty) days. Pt gets eyes injections  through  eye center  Pseudoephedrine-APAP-DM 30-325-15 MG Caps  Take 2 capsules by mouth every 6 (six) hours as needed (cough).  Vitamin D (Ergocalciferol) 50000 UNITS Caps capsule  Commonly known as: DRISDOL  Take 50,000 Units by mouth every Thursday.  Follow-up Information  Follow up with [facility]. (home health physical and occupational therapy, nurse and aide)  Contact information:  [address]  [place] [address]  [phone]  Follow up with [facility] On 04/14/2014. (2 pm)  Contact information:  [place] [address]  [phone]  --------------------------------------------------------------------------------  The results of significant diagnostics from this hospitalization (including imaging, microbiology, ancillary and laboratory) are listed below for reference.  Significant Diagnostic Studies:  Dg Chest Port 1 View (if Code Sepsis Called)  03/22/2014 IMPRESSION: No acute findings. Low lung volumes.  Microbiology:  URINE CULTURE Status: None  Collection Time  03/22/14 7:44 PM  Result Value Ref Range Status  Specimen Description URINE, CATHETERIZED Final  Value: ESCHERICHIA COLI  Performed at [facility]  Report Status 03/25/2014 FINAL Final  Organism ID, Bacteria ESCHERICHIA COLI Final  CULTURE, BLOOD (ROUTINE X 2) Status: None  Collection Time  03/22/14 7:50 PM  Result Value Ref Range Status  Specimen  Description BLOOD L FOREARM Final  Value: GROUP B STREP(S.AGALACTIAE)ISOLATED  Note: SUSCEPTIBILITIES PERFORMED ON PREVIOUS CULTURE WITHIN THE LAST 5 DAYS.  Note: Gram Stain Report Called to,Read Back By and Verified With: MEGAN STOCKS 03/23/14 1515 BY SMITHERSJ  Performed at [facility]  Report Status 03/25/2014 FINAL Final  CULTURE, BLOOD (ROUTINE X 2) Status: None  Collection Time  03/22/14 7:55 PM  Result Value Ref Range Status  Specimen Description  BLOOD R HAND Final  Value: GROUP B STREP(S.AGALACTIAE)ISOLATED  Note: Gram Stain Report Called to,Read Back By and Verified With: MEGAN STOCKS 03/23/14 1515 BY SMITHERSJ  Performed at [facility]  Report Status 03/25/2014 FINAL Final  Organism ID, Bacteria GROUP B STREP(S.AGALACTIAE)ISOLATED Final  MRSA PCR SCREENING Status: None  Collection Time  03/22/14 10:42 PM  Result Value Ref Range Status  MRSA by PCR NEGATIVE NEGATIVE Final  CULTURE, BLOOD (ROUTINE X 2) Status: None  Collection Time  03/25/14 12:15 PM  Result Value Ref Range Status  Specimen Description BLOOD RIGHT ARM Final  Value: BLOOD CULTURE RECEIVED NO GROWTH TO DATE CULTURE WILL BE HELD FOR 5 DAYS BEFORE ISSUING A FINAL NEGATIVE REPORT  Performed at [facility]  Report Status PENDING Incomplete  CULTURE, BLOOD (ROUTINE X 2) Status: None  Collection Time  03/25/14 12:25 PM  Result Value Ref Range Status  Specimen Description BLOOD LEFT ARM Final  Value: BLOOD CULTURE RECEIVED NO GROWTH TO DATE CULTURE WILL BE HELD FOR 5 DAYS BEFORE ISSUING A FINAL NEGATIVE REPORT  Performed at [facility]  Report Status PENDING Incomplete  Labs:  Basic Metabolic Panel:  Recent Labs  Lab 03/22/14  1950 03/23/14  0040 03/24/14  0403 03/25/14  0353  NA 132* 137 135* 137  K 3.5* 3.0* 3.3* 4.2  CL 95* 102 101 103  CO2 _0 GLUCOSE 181* 163* 120* 130*  BUN 35* 26* 16 12  CREATININE 1.22* 0.92 0.69 0.67  CALCIUM 8.7 8.1* 8.1* 8.2*  MG -- 2.1 -- --  Liver Function Tests:  Recent Labs  Lab 03/22/14  1950  AST 45*  ALT 28  ALKPHOS 55  BILITOT 0.5  PROT 7.0  ALBUMIN 3.4*  No results found for this basename: LIPASE, AMYLASE, in the last 168 hours  No results found for this basename: AMMONIA, in the last 168 hours  CBC:  Recent Labs  Lab 03/22/14  1950 03/23/14  0040 03/24/14  0403 03/25/14  0353  WBC 22.4* 25.8* 17.8* 14.6*  HGB 11.4* 10.6* 10.5* 9.9*  HCT 33.3* 31.5* 31.0* 30.2*   MCV 88.1 89.0 89.6 89.3  PLT 199 181 184 215  Cardiac Enzymes:  Recent Labs  Lab 03/22/14  1950  CKTOTAL 881*  CKMB 3.3  BNP:  BNP (last 3 results)  No results found for this basename: PROBNP, in the last 8760 hours  CBG:  No results found for this basename: GLUCAP, in the last 168 hours  Time coordinating discharge: Over 30 minutes  For TEE; no changes.  Kirk Ruths

## 2014-04-09 NOTE — Progress Notes (Signed)
  Echocardiogram Echocardiogram Transesophageal has been performed.  Dayannara Pascal, [place] 04/09/2014, 11:10 AM

## 2014-04-09 NOTE — Interval H&P Note (Signed)
History and Physical Interval Note:  04/09/2014 10:06 AM  Dawn Lawson  has presented today for surgery, with the diagnosis of RULE OUT VALVE VEGETATION  The various methods of treatment have been discussed with the patient and family. After consideration of risks, benefits and other options for treatment, the patient has consented to  Procedure(s): TRANSESOPHAGEAL ECHOCARDIOGRAM (TEE) (N/A) as a surgical intervention .  The patient's history has been reviewed, patient examined, no change in status, stable for surgery.  I have reviewed the patient's chart and labs.  Questions were answered to the patient's satisfaction.     Kirk Ruths

## 2014-04-10 ENCOUNTER — Encounter (HOSPITAL_COMMUNITY): Payer: Self-pay | Admitting: Cardiology

## 2014-04-13 ENCOUNTER — Telehealth: Payer: Self-pay | Admitting: *Deleted

## 2014-04-13 DIAGNOSIS — N390 Urinary tract infection, site not specified: Secondary | ICD-10-CM | POA: Diagnosis not present

## 2014-04-13 DIAGNOSIS — R5381 Other malaise: Secondary | ICD-10-CM | POA: Diagnosis not present

## 2014-04-13 DIAGNOSIS — Z452 Encounter for adjustment and management of vascular access device: Secondary | ICD-10-CM | POA: Diagnosis not present

## 2014-04-13 DIAGNOSIS — A419 Sepsis, unspecified organism: Secondary | ICD-10-CM | POA: Diagnosis not present

## 2014-04-13 DIAGNOSIS — A498 Other bacterial infections of unspecified site: Secondary | ICD-10-CM | POA: Diagnosis not present

## 2014-04-13 NOTE — Telephone Encounter (Signed)
Received a call from [facility]). Tiffany wanted to know if she could go ahead and remove the patient's PICC line. Informed Tiffany patient has an appointment with Dr. Doreene Burke on 04/14/2014 and this matter can be discussed during that visit. Tiffany verbalized understanding. Vivia Birmingham, RN

## 2014-04-14 ENCOUNTER — Encounter: Payer: Self-pay | Admitting: Internal Medicine

## 2014-04-14 ENCOUNTER — Ambulatory Visit: Payer: Medicare Other | Attending: Internal Medicine | Admitting: Internal Medicine

## 2014-04-14 VITALS — BP 147/72 | HR 66 | Temp 99.4°F | Resp 16

## 2014-04-14 DIAGNOSIS — Z95828 Presence of other vascular implants and grafts: Secondary | ICD-10-CM

## 2014-04-14 DIAGNOSIS — Z79899 Other long term (current) drug therapy: Secondary | ICD-10-CM | POA: Diagnosis not present

## 2014-04-14 DIAGNOSIS — Z9889 Other specified postprocedural states: Secondary | ICD-10-CM | POA: Diagnosis not present

## 2014-04-14 DIAGNOSIS — A409 Streptococcal sepsis, unspecified: Secondary | ICD-10-CM | POA: Diagnosis not present

## 2014-04-14 DIAGNOSIS — Z880 Allergy status to penicillin: Secondary | ICD-10-CM | POA: Insufficient documentation

## 2014-04-14 DIAGNOSIS — A401 Sepsis due to streptococcus, group B: Secondary | ICD-10-CM | POA: Insufficient documentation

## 2014-04-14 DIAGNOSIS — I10 Essential (primary) hypertension: Secondary | ICD-10-CM | POA: Diagnosis not present

## 2014-04-14 DIAGNOSIS — A419 Sepsis, unspecified organism: Secondary | ICD-10-CM

## 2014-04-14 HISTORY — DX: Presence of other vascular implants and grafts: Z95.828

## 2014-04-14 HISTORY — DX: Sepsis due to Streptococcus, group B: A40.1

## 2014-04-14 NOTE — Progress Notes (Signed)
Patient ID: Dawn Lawson, female   DOB: [birth year], 80 y.o.   MRN: [id number]   Dawn Lawson, is a 80 y.o. female  VHQ:[id number]  [id number]  DOB - 12/15/1928  CC:  Chief Complaint  Patient presents with  . Hospitalization Follow-up       HPI: Dawn Lawson is a 80 y.o. female here today to establish medical care. Elderly woman with history of hypertension and osteoarthritis recently admitted to the hospital for group B streptococcus bacteremia, cellulitis of right leg, urinary tract infection with Escherichia coli and acute tubular necrosis with renal failure resolved. Patient was discharged on PICC line for antibiotics intravenously, she had completed antibiotics for about 5 days and needs to remove PICC line. She was given this appointment for PICC line removal. She has no complaint today. She has no personal history of hypertension or diabetes. She claims to be doing very well at home, no fever, UTI symptoms has resolved. Her activity of daily living: Patient does her own cooking, her daughter in law does her grocery shopping. She uses a wheelchair. She never smoked, she does not drink alcohol. Patient has No headache, No chest pain, No abdominal pain - No Nausea, No new weakness tingling or numbness, No Cough - SOB.  Allergies  Allergen Reactions  . Ampicillin Hives and Itching  . Penicillins Hives and Itching   Past Medical History  Diagnosis Date  . Arthritis   . Cataract   . Heart murmur    Current Outpatient Prescriptions on File Prior to Visit  Medication Sig Dispense Refill  . acetaminophen (TYLENOL) 650 MG CR tablet Take 1,300 mg by mouth every 8 (eight) hours as needed for pain.       Marland Kitchen acyclovir ointment (ZOVIRAX) 5 % Apply topically every 3 (three) hours. Please continue for 2 weeks.  15 g  1  . aspirin EC 81 MG tablet Take 81 mg by mouth daily.      . bisoprolol-hydrochlorothiazide (ZIAC) 2.5-6.25 MG per tablet Take 1 tablet by mouth daily.      . hydroxypropyl  methylcellulose (ISOPTO TEARS) 2.5 % ophthalmic solution Place 1 drop into both eyes 3 (three) times daily as needed for dry eyes.      Marland Kitchen PRESCRIPTION MEDICATION Inject 1 application as directed every 30 (thirty) days. Pt gets eyes injections through [facility]      . Pseudoephedrine-APAP-DM 30-325-15 MG CAPS Take 2 capsules by mouth at bedtime as needed (sleep).       . Vitamin D, Ergocalciferol, (DRISDOL) 50000 UNITS CAPS capsule Take 50,000 Units by mouth every Thursday.       No current facility-administered medications on file prior to visit.   Family History  Problem Relation Age of Onset  . Heart disease Mother   . Cancer Mother   . Heart disease Father    History   Social History  . Marital Status: Widowed    Spouse Name: N/A    Number of Children: N/A  . Years of Education: N/A   Occupational History  . Not on file.   Social History Main Topics  . Smoking status: Never Smoker   . Smokeless tobacco: Not on file  . Alcohol Use: No  . Drug Use: No  . Sexual Activity: No   Other Topics Concern  . Not on file   Social History Narrative  . No narrative on file    Review of Systems: Constitutional: Negative for fever, chills, diaphoresis, activity change, appetite change  and fatigue. HENT: Negative for ear pain, nosebleeds, congestion, facial swelling, rhinorrhea, neck pain, neck stiffness and ear discharge.  Eyes: Negative for pain, discharge, redness, itching and visual disturbance. Respiratory: Occasional dry cough, no choking, chest tightness, shortness of breath, wheezing and stridor.  Cardiovascular: Negative for chest pain, palpitations and leg swelling. Gastrointestinal: Negative for abdominal distention. Genitourinary: Negative for dysuria, urgency, frequency, hematuria, flank pain, decreased urine volume, difficulty urinating and dyspareunia.  Musculoskeletal: Negative for back pain, joint swelling, arthralgia and gait problem. Neurological:  Negative for dizziness, tremors, seizures, syncope, facial asymmetry, speech difficulty, weakness, light-headedness, numbness and headaches.  Hematological: Negative for adenopathy. Does not bruise/bleed easily. Psychiatric/Behavioral: Negative for hallucinations, behavioral problems, confusion, dysphoric mood, decreased concentration and agitation.    Objective:   Filed Vitals:   04/14/14 1417  BP: 147/72  Pulse: 66  Temp: 99.4 F (37.4 C)  Resp: 16    Physical Exam: Constitutional: Patient appears well-developed and well-nourished. No distress. On wheel chair HENT: Normocephalic, atraumatic, External right and left ear normal. Oropharynx is clear and moist.  Eyes: Conjunctivae and EOM are normal. PERRLA, no scleral icterus. Neck: Normal ROM. Neck supple. No JVD. No tracheal deviation. No thyromegaly. CVS: RRR, E9/B2 +, soft systolic murmur, no gallops, no carotid bruit.  Pulmonary: Effort and breath sounds normal, no stridor, rhonchi, wheezes, rales.  Abdominal: Soft. BS +, no distension, tenderness, rebound or guarding.  Musculoskeletal: Evidence of knee replacement on the right, bilateral osteoarthritis  Lymphadenopathy: No lymphadenopathy noted, cervical, inguinal or axillary Neuro: Alert. Normal reflexes, muscle tone coordination. No cranial nerve deficit. Skin: Skin is warm and dry. No rash noted. Not diaphoretic. No erythema. No pallor. Psychiatric: Normal mood and affect. Behavior, judgment, thought content normal.  Lab Results  Component Value Date   WBC 14.6* 03/25/2014   HGB 9.9* 03/25/2014   HCT 30.2* 03/25/2014   MCV 89.3 03/25/2014   PLT 215 03/25/2014   Lab Results  Component Value Date   CREATININE 0.67 03/25/2014   BUN 12 03/25/2014   NA 137 03/25/2014   K 4.2 03/25/2014   CL 103 03/25/2014   CO2 25 03/25/2014    No results found for this basename: HGBA1C   Lipid Panel  No results found for this basename: chol, trig, hdl, cholhdl, vldl, ldlcalc       Assessment and  plan:   1. Status post PICC central line placement  - PICC line removal done in the clinic under sterile condition Firm dressing applied Patient was educated on the care  2. Sepsis due to group B Streptococcus: Resolved Repeat blood cultures negative Patient was treated appropriately with antibiotics   Return in about 3 months (around 07/15/2014), or if symptoms worsen or fail to improve, for Routine Follow Up, Follow up HTN, Annual Physical.  The patient was given clear instructions to go to ER or return to medical center if symptoms don't improve, worsen or new problems develop. The patient verbalized understanding. The patient was told to call to get lab results if they haven't heard anything in the next week.     This note has been created with Surveyor, quantity. Any transcriptional errors are unintentional.    Angelica Chessman, MD, [place], [place], [place], [place]   04/14/2014, 5:37 PM

## 2014-04-14 NOTE — Patient Instructions (Addendum)
PICC Removal A peripherally inserted central catheter (PICC) is a long, thin, flexible tube that a health care Mandeep Kiser can insert into a vein in your upper arm. It is a type of IV. Having a PICC in place gives health care providers quick access to your veins. It is a good way to distribute medicines and fluids quickly throughout your body. LET [facility] CARE Nonie Lochner KNOW ABOUT:  Any allergies you have.  All medicines you are taking, including vitamins, herbs, eye drops, creams, and over-the-counter medicines.  Previous problems you or members of your family have had with the use of anesthetics.  Any blood disorders you have.  Previous surgeries you have had.  Medical conditions you have. RISKS AND COMPLICATIONS Generally, this is a safe procedure. However, as with any procedure, problems can occur. Possible problems include:  Bleeding.  Infection. BEFORE THE PROCEDURE You need an order from your health care Dorise Gangi to have your PICC removed. Only a health care Helaine Yackel trained in PICC removal should take it out.You may have your PICC removed in the hospital or in an outpatient setting. PROCEDURE Having a PICC removed is usually painless. Removal of the tape that holds the PICC in place may be the most uncomfortable part. Do not take out the PICC yourself. Only a trained clinical professional, such as a PICC nurse, should remove it. If your health care Evelia Waskey thinks your PICC is infected, the tip may be sent to the lab for testing. After taking out your PICC, your health care Banyan Goodchild may:   Hold gentle pressure on the exit site.  Apply some antibiotic ointment.  Place a small bandage over the insertion site. AFTER THE PROCEDURE You should be able to remove the bandage after 24 hours. Follow all your health care Cameron Schwinn's instructions.   Keep the insertion site clean by washing it gently with soap and water.  Do not pick or remove a scab.  Avoid strenuous physical  activity for a day or two.  Let your health care Shambhavi Salley know if you develop redness, soreness, bleeding, swelling, or drainage from the insertion site.  Let your health care Raniyah Curenton know if you develop chills or fever. Document Released: 03/29/2010 Document Revised: 10/14/2013 Document Reviewed: 08/01/2013 [facility] Patient Information 2015 [place], [place]. This information is not intended to replace advice given to you by your health care Mildreth Reek. Make sure you discuss any questions you have with your health care Capucine Tryon. Hypertension Hypertension, commonly called high blood pressure, is when the force of blood pumping through your arteries is too strong. Your arteries are the blood vessels that carry blood from your heart throughout your body. A blood pressure reading consists of a higher number over a lower number, such as 110/72. The higher number (systolic) is the pressure inside your arteries when your heart pumps. The lower number (diastolic) is the pressure inside your arteries when your heart relaxes. Ideally you want your blood pressure below 120/80. Hypertension forces your heart to work harder to pump blood. Your arteries may become narrow or stiff. Having hypertension puts you at risk for heart disease, stroke, and other problems.  RISK FACTORS Some risk factors for high blood pressure are controllable. Others are not.  Risk factors you cannot control include:   Race. You may be at higher risk if you are African American.  Age. Risk increases with age.  Gender. Men are at higher risk than women before age 35 years. After age 50, women are at higher risk than men.  Risk factors you can control include:  Not getting enough exercise or physical activity.  Being overweight.  Getting too much fat, sugar, calories, or salt in your diet.  Drinking too much alcohol. SIGNS AND SYMPTOMS Hypertension does not usually cause signs or symptoms. Extremely high blood pressure  (hypertensive crisis) may cause headache, anxiety, shortness of breath, and nosebleed. DIAGNOSIS  To check if you have hypertension, your health care Bryanne Riquelme will measure your blood pressure while you are seated, with your arm held at the level of your heart. It should be measured at least twice using the same arm. Certain conditions can cause a difference in blood pressure between your right and left arms. A blood pressure reading that is higher than normal on one occasion does not mean that you need treatment. If one blood pressure reading is high, ask your health care Yocelin Vanlue about having it checked again. TREATMENT  Treating high blood pressure includes making lifestyle changes and possibly taking medication. Living a healthy lifestyle can help lower high blood pressure. You may need to change some of your habits. Lifestyle changes may include:  Following the DASH diet. This diet is high in fruits, vegetables, and whole grains. It is low in salt, red meat, and added sugars.  Getting at least 2 1/2 hours of brisk physical activity every week.  Losing weight if necessary.  Not smoking.  Limiting alcoholic beverages.  Learning ways to reduce stress. If lifestyle changes are not enough to get your blood pressure under control, your health care Deshawna Mcneece may prescribe medicine. You may need to take more than one. Work closely with your health care Roseanna Koplin to understand the risks and benefits. HOME CARE INSTRUCTIONS  Have your blood pressure rechecked as directed by your health care Jaqualyn Juday.   Only take medicine as directed by your health care Pheonix Wisby. Follow the directions carefully. Blood pressure medicines must be taken as prescribed. The medicine does not work as well when you skip doses. Skipping doses also puts you at risk for problems.   Do not smoke.   Monitor your blood pressure at home as directed by your health care Kilan Banfill. SEEK MEDICAL CARE IF:   You think you are  having a reaction to medicines taken.  You have recurrent headaches or feel dizzy.  You have swelling in your ankles.  You have trouble with your vision. SEEK IMMEDIATE MEDICAL CARE IF:  You develop a severe headache or confusion.  You have unusual weakness, numbness, or feel faint.  You have severe chest or abdominal pain.  You vomit repeatedly.  You have trouble breathing. MAKE SURE YOU:   Understand these instructions.  Will watch your condition.  Will get help right away if you are not doing well or get worse. Document Released: 10/09/2005 Document Revised: 10/14/2013 Document Reviewed: 08/01/2013 [facility] Patient Information 2015 [place], [place]. This information is not intended to replace advice given to you by your health care Matej Sappenfield. Make sure you discuss any questions you have with your health care Arleatha Philipps.

## 2014-04-14 NOTE — Progress Notes (Signed)
HFU Pt went to the ER having Severe sepsis due to a UTI Pt has no C.C. Today. Pt is hoping we can take out her picc line today.

## 2014-04-14 NOTE — Progress Notes (Signed)
Right arm single picc line removed without diff and flushed for positive blood return No redness or swelling at site Pressure dressing placed

## 2014-04-20 DIAGNOSIS — N390 Urinary tract infection, site not specified: Secondary | ICD-10-CM | POA: Diagnosis not present

## 2014-04-20 DIAGNOSIS — R5381 Other malaise: Secondary | ICD-10-CM | POA: Diagnosis not present

## 2014-04-20 DIAGNOSIS — A498 Other bacterial infections of unspecified site: Secondary | ICD-10-CM | POA: Diagnosis not present

## 2014-04-20 DIAGNOSIS — Z452 Encounter for adjustment and management of vascular access device: Secondary | ICD-10-CM | POA: Diagnosis not present

## 2014-04-20 DIAGNOSIS — A419 Sepsis, unspecified organism: Secondary | ICD-10-CM | POA: Diagnosis not present

## 2014-04-22 ENCOUNTER — Ambulatory Visit (INDEPENDENT_AMBULATORY_CARE_PROVIDER_SITE_OTHER): Payer: Medicare Other | Admitting: Family Medicine

## 2014-04-22 ENCOUNTER — Ambulatory Visit (INDEPENDENT_AMBULATORY_CARE_PROVIDER_SITE_OTHER): Payer: Medicare Other

## 2014-04-22 VITALS — BP 132/62 | HR 66 | Temp 98.1°F | Resp 20 | Ht 59.0 in | Wt 182.4 lb

## 2014-04-22 DIAGNOSIS — R05 Cough: Secondary | ICD-10-CM | POA: Diagnosis not present

## 2014-04-22 DIAGNOSIS — R6520 Severe sepsis without septic shock: Secondary | ICD-10-CM

## 2014-04-22 DIAGNOSIS — A419 Sepsis, unspecified organism: Secondary | ICD-10-CM | POA: Diagnosis not present

## 2014-04-22 DIAGNOSIS — Z9889 Other specified postprocedural states: Secondary | ICD-10-CM

## 2014-04-22 DIAGNOSIS — Z95828 Presence of other vascular implants and grafts: Secondary | ICD-10-CM

## 2014-04-22 DIAGNOSIS — R059 Cough, unspecified: Secondary | ICD-10-CM

## 2014-04-22 DIAGNOSIS — L03115 Cellulitis of right lower limb: Secondary | ICD-10-CM

## 2014-04-22 DIAGNOSIS — L03119 Cellulitis of unspecified part of limb: Secondary | ICD-10-CM

## 2014-04-22 DIAGNOSIS — A409 Streptococcal sepsis, unspecified: Secondary | ICD-10-CM

## 2014-04-22 DIAGNOSIS — L02419 Cutaneous abscess of limb, unspecified: Secondary | ICD-10-CM

## 2014-04-22 DIAGNOSIS — J209 Acute bronchitis, unspecified: Secondary | ICD-10-CM | POA: Diagnosis not present

## 2014-04-22 DIAGNOSIS — N390 Urinary tract infection, site not specified: Secondary | ICD-10-CM | POA: Diagnosis not present

## 2014-04-22 DIAGNOSIS — A401 Sepsis due to streptococcus, group B: Secondary | ICD-10-CM

## 2014-04-22 MED ORDER — AZITHROMYCIN 250 MG PO TABS: ORAL_TABLET | ORAL | Status: DC

## 2014-04-22 MED ORDER — BENZONATATE 100 MG PO CAPS: 100.0000 mg | ORAL_CAPSULE | Freq: Three times a day (TID) | ORAL | Status: DC | PRN

## 2014-04-22 NOTE — Progress Notes (Signed)
Subjective:    Patient ID: Dawn Lawson, female    DOB: 1929-10-02, 80 y.o.   MRN: [id number]  04/22/2014  Cough   Cough Pertinent negatives include no chest pain, chills, ear pain, fever, postnasal drip, rash, rhinorrhea, sore throat, shortness of breath or wheezing.   This 80 y.o. female presents for evaluation of cough onset four days ago.  No fever/chills/sweats.  No headache.  No ear pain or sore throat.  No rhinorrhea; no nasal congestion.  +coughing.  No mucous.  No SOB.  Minimal ambulation.  Walks with a cane at home.  S/p R TKR.  No nausea or vomiting or diarrhea.  Took Tylenol Cold which made her sick.  No history of COPD, emphysema, or asthma.  No tobacco.  No history of CHF.  Has chronic leg swelling which has actually improved.  Denies heartburn or indigestion.  Recent admission with discharge with urosepsis Group B strep and E. Coli; +GBS bacteremia; discharged on Vancomycin for 13 days via PICC.  S/p TEE that was negative for vegetations.  Also suffered with acute kidney failure with acute tubular necrosis, lactic acidosis and cellulitis R leg.  S/p PICC line removal 04/16/14.  Renal function returned to normal (creatinine 0.67) with hydration.  Review of Systems  Constitutional: Negative for fever, chills, diaphoresis and fatigue.  HENT: Negative for congestion, ear pain, postnasal drip, rhinorrhea, sneezing, sore throat, trouble swallowing and voice change.   Respiratory: Positive for cough. Negative for shortness of breath, wheezing and stridor.   Cardiovascular: Negative for chest pain, palpitations and leg swelling.  Gastrointestinal: Negative for nausea, vomiting, abdominal pain and diarrhea.  Skin: Negative for rash.    Past Medical History  Diagnosis Date  . Arthritis   . Cataract   . Heart murmur    Past Surgical History  Procedure Laterality Date  . Appendectomy    . Eye surgery    . Joint replacement    . Abdominal hysterectomy      partial  . Tee  without cardioversion N/A 04/09/2014    Procedure: TRANSESOPHAGEAL ECHOCARDIOGRAM (TEE);  Surgeon: Dawn Perla, MD;  [facility] ENDOSCOPY;  Service: Cardiovascular;  Laterality: N/A;    Allergies  Allergen Reactions  . Ampicillin Hives and Itching  . Penicillins Hives and Itching   Current Outpatient Prescriptions  Medication Sig Dispense Refill  . acetaminophen (TYLENOL) 650 MG CR tablet Take 1,300 mg by mouth every 8 (eight) hours as needed for pain.       Marland Kitchen acyclovir ointment (ZOVIRAX) 5 % Apply topically every 3 (three) hours. Please continue for 2 weeks.  15 g  1  . aspirin EC 81 MG tablet Take 81 mg by mouth daily.      . bisoprolol-hydrochlorothiazide (ZIAC) 2.5-6.25 MG per tablet Take 1 tablet by mouth daily.      . hydroxypropyl methylcellulose (ISOPTO TEARS) 2.5 % ophthalmic solution Place 1 drop into both eyes 3 (three) times daily as needed for dry eyes.      Marland Kitchen PRESCRIPTION MEDICATION Inject 1 application as directed every 30 (thirty) days. Pt gets eyes injections through [facility]      . Pseudoephedrine-APAP-DM 30-325-15 MG CAPS Take 2 capsules by mouth at bedtime as needed (sleep).       . Vitamin D, Ergocalciferol, (DRISDOL) 50000 UNITS CAPS capsule Take 50,000 Units by mouth every Thursday.      Marland Kitchen azithromycin (ZITHROMAX) 250 MG tablet Two tablets daily x 1 day then one tablet  daily x 4 days  6 tablet  0  . benzonatate (TESSALON) 100 MG capsule Take 1-2 capsules (100-200 mg total) by mouth 3 (three) times daily as needed for cough.  40 capsule  0   No current facility-administered medications for this visit.   History   Social History  . Marital Status: Widowed    Spouse Name: N/A    Number of Children: N/A  . Years of Education: N/A   Occupational History  . Not on file.   Social History Main Topics  . Smoking status: Never Smoker   . Smokeless tobacco: Not on file  . Alcohol Use: No  . Drug Use: No  . Sexual Activity: No   Other Topics  Concern  . Not on file   Social History Narrative  . No narrative on file       Objective:    BP 132/62  Pulse 66  Temp(Src) 98.1 F (36.7 C) (Oral)  Resp 20  Ht 4\' 11"  (1.499 m)  Wt 182 lb 6.4 oz (82.736 kg)  BMI 36.82 kg/m2  SpO2 98% Physical Exam  Constitutional: She is oriented to person, place, and time. She appears well-developed and well-nourished. No distress.  HENT:  Head: Normocephalic and atraumatic.  Right Ear: External ear normal.  Left Ear: External ear normal.  Nose: Nose normal.  Mouth/Throat: Oropharynx is clear and moist.  Eyes: Conjunctivae and EOM are normal. Pupils are equal, round, and reactive to light.  Neck: Normal range of motion. Neck supple. Carotid bruit is not present. No thyromegaly present.  Cardiovascular: Normal rate, regular rhythm, normal heart sounds and intact distal pulses.  Exam reveals no gallop and no friction rub.   No murmur heard. 1+ non-pitting edema B lower extremities.  Pulmonary/Chest: Effort normal and breath sounds normal. She has no wheezes. She has no rales.  Lymphadenopathy:    She has no cervical adenopathy.  Neurological: She is alert and oriented to person, place, and time. No cranial nerve deficit.  Skin: Skin is warm and dry. No rash noted. She is not diaphoretic. No erythema. No pallor.  Psychiatric: She has a normal mood and affect. Her behavior is normal.    [facility] reading (PRIMARY) by  Dr. Tamala Julian.  CXR:  NAD.      Assessment & Plan:  Cough - Plan: DG Chest 2 View, CBC with Differential  Acute bronchitis, unspecified organism  UTI (lower urinary tract infection)  Severe sepsis with acute organ dysfunction  Cellulitis of right leg  Status post PICC central line placement  Sepsis due to group B Streptococcus  1. Acute bronchitis:  New.  Treat with [place], [place]. Recent hospitalization for E. Coli UTI and GBS bacteremia.  Thus, will obtain CBC.  S/p TEE without evidence of vegetation. 2.  UTI:  resolved; s/p abx during admission. 3. GBS bacteremia: resolved; s/p 13 days of iv vancomycin.   S/p TEE by cardiology. 4. Cellulitis R leg: resolved.  Meds ordered this encounter  Medications  . azithromycin (ZITHROMAX) 250 MG tablet    Sig: Two tablets daily x 1 day then one tablet daily x 4 days    Dispense:  6 tablet    Refill:  0  . benzonatate (TESSALON) 100 MG capsule    Sig: Take 1-2 capsules (100-200 mg total) by mouth 3 (three) times daily as needed for cough.    Dispense:  40 capsule    Refill:  0    No Follow-up on file.  Reginia Forts, M.D.  [facility] [address] [place], [place]  [phone] phone [phone] fax

## 2014-04-22 NOTE — Patient Instructions (Signed)
Bronchitis  Bronchitis is inflammation of the airways that extend from the windpipe into the lungs (bronchi). The inflammation often causes mucus to develop, which leads to a cough. If the inflammation becomes severe, it may cause shortness of breath.  CAUSES   Bronchitis may be caused by:    Viral infections.    Bacteria.    Cigarette smoke.    Allergens, pollutants, and other irritants.   SIGNS AND SYMPTOMS   The most common symptom of bronchitis is a frequent cough that produces mucus. Other symptoms include:   Fever.    Body aches.    Chest congestion.    Chills.    Shortness of breath.    Sore throat.   DIAGNOSIS   Bronchitis is usually diagnosed through a medical history and physical exam. Tests, such as chest X-rays, are sometimes done to rule out other conditions.   TREATMENT   You may need to avoid contact with whatever caused the problem (smoking, for example). Medicines are sometimes needed. These may include:   Antibiotics. These may be prescribed if the condition is caused by bacteria.   Cough suppressants. These may be prescribed for relief of cough symptoms.    Inhaled medicines. These may be prescribed to help open your airways and make it easier for you to breathe.    Steroid medicines. These may be prescribed for those with recurrent (chronic) bronchitis.  HOME CARE INSTRUCTIONS   Get plenty of rest.    Drink enough fluids to keep your urine clear or pale yellow (unless you have a medical condition that requires fluid restriction). Increasing fluids may help thin your secretions and will prevent dehydration.    Only take over-the-counter or prescription medicines as directed by your health care provider.   Only take antibiotics as directed. Make sure you finish them even if you start to feel better.   Avoid secondhand smoke, irritating chemicals, and strong fumes. These will make bronchitis worse. If you are a smoker, quit smoking. Consider using nicotine gum or  skin patches to help control withdrawal symptoms. Quitting smoking will help your lungs heal faster.    Put a cool-mist humidifier in your bedroom at night to moisten the air. This may help loosen mucus. Change the water in the humidifier daily. You can also run the hot water in your shower and sit in the bathroom with the door closed for 5-10 minutes.    Follow up with your health care provider as directed.    Wash your hands frequently to avoid catching bronchitis again or spreading an infection to others.   SEEK MEDICAL CARE IF:  Your symptoms do not improve after 1 week of treatment.   SEEK IMMEDIATE MEDICAL CARE IF:   Your fever increases.   You have chills.    You have chest pain.    You have worsening shortness of breath.    You have bloody sputum.   You faint.   You have lightheadedness.   You have a severe headache.    You vomit repeatedly.  MAKE SURE YOU:    Understand these instructions.   Will watch your condition.   Will get help right away if you are not doing well or get worse.  Document Released: 10/09/2005 Document Revised: 07/30/2013 Document Reviewed: 06/03/2013  [employer] Patient Information 2015 [employer]. This information is not intended to replace advice given to you by your health care provider. Make sure you discuss any questions you have with your health care   provider.

## 2014-04-23 LAB — CBC WITH DIFFERENTIAL/PLATELET
Basophils Absolute: 0.1 10*3/uL (ref 0.0–0.1)
Basophils Relative: 1 % (ref 0–1)
Eosinophils Absolute: 0.4 10*3/uL (ref 0.0–0.7)
Eosinophils Relative: 3 % (ref 0–5)
HCT: 28.4 % — ABNORMAL LOW (ref 36.0–46.0)
Hemoglobin: 9.3 g/dL — ABNORMAL LOW (ref 12.0–15.0)
LYMPHS PCT: 24 % (ref 12–46)
Lymphs Abs: 3.4 10*3/uL (ref 0.7–4.0)
MCH: 27.1 pg (ref 26.0–34.0)
MCHC: 32.7 g/dL (ref 30.0–36.0)
MCV: 82.8 fL (ref 78.0–100.0)
Monocytes Absolute: 1.1 10*3/uL — ABNORMAL HIGH (ref 0.1–1.0)
Monocytes Relative: 8 % (ref 3–12)
NEUTROS ABS: 9.1 10*3/uL — AB (ref 1.7–7.7)
NEUTROS PCT: 64 % (ref 43–77)
PLATELETS: 552 10*3/uL — AB (ref 150–400)
RBC: 3.43 MIL/uL — AB (ref 3.87–5.11)
RDW: 15 % (ref 11.5–15.5)
WBC: 14.2 10*3/uL — ABNORMAL HIGH (ref 4.0–10.5)

## 2014-05-01 ENCOUNTER — Telehealth: Payer: Self-pay

## 2014-05-01 NOTE — Telephone Encounter (Signed)
Pt daughter in law called stating that we called with lab results and that we recommended she see her primary care but they are wanting to change primary care providers and the daughter in law is wanting to know what to do in the mean time About her labs until they can find a new pcp.  [place]

## 2014-05-04 NOTE — Telephone Encounter (Signed)
Should she RTC here?

## 2014-05-05 NOTE — Telephone Encounter (Signed)
Absolutely; recommend patient follow-up at [facility] in upcoming 1-3 weeks for repeat labs.

## 2014-05-05 NOTE — Telephone Encounter (Signed)
lmom fpr pt to cb.

## 2014-05-06 NOTE — Telephone Encounter (Signed)
They have made a follow up appt somewhere else.

## 2014-05-07 DIAGNOSIS — H353290 Exudative age-related macular degeneration, unspecified eye, stage unspecified: Secondary | ICD-10-CM | POA: Diagnosis not present

## 2014-05-14 ENCOUNTER — Encounter: Payer: Self-pay | Admitting: Internal Medicine

## 2014-05-14 ENCOUNTER — Ambulatory Visit: Payer: Medicare Other | Attending: Internal Medicine | Admitting: Internal Medicine

## 2014-05-14 VITALS — BP 165/64 | HR 62 | Temp 98.7°F | Resp 14

## 2014-05-14 DIAGNOSIS — I10 Essential (primary) hypertension: Secondary | ICD-10-CM | POA: Insufficient documentation

## 2014-05-14 DIAGNOSIS — D509 Iron deficiency anemia, unspecified: Secondary | ICD-10-CM

## 2014-05-14 DIAGNOSIS — Z79899 Other long term (current) drug therapy: Secondary | ICD-10-CM | POA: Diagnosis not present

## 2014-05-14 DIAGNOSIS — Z7982 Long term (current) use of aspirin: Secondary | ICD-10-CM | POA: Insufficient documentation

## 2014-05-14 DIAGNOSIS — D649 Anemia, unspecified: Secondary | ICD-10-CM | POA: Insufficient documentation

## 2014-05-14 DIAGNOSIS — R011 Cardiac murmur, unspecified: Secondary | ICD-10-CM | POA: Diagnosis not present

## 2014-05-14 LAB — CBC WITH DIFFERENTIAL/PLATELET
BASOS ABS: 0.1 10*3/uL (ref 0.0–0.1)
Basophils Relative: 1 % (ref 0–1)
EOS ABS: 0.2 10*3/uL (ref 0.0–0.7)
EOS PCT: 2 % (ref 0–5)
HCT: 29.6 % — ABNORMAL LOW (ref 36.0–46.0)
Hemoglobin: 9.7 g/dL — ABNORMAL LOW (ref 12.0–15.0)
Lymphocytes Relative: 18 % (ref 12–46)
Lymphs Abs: 2 10*3/uL (ref 0.7–4.0)
MCH: 27.2 pg (ref 26.0–34.0)
MCHC: 32.8 g/dL (ref 30.0–36.0)
MCV: 82.9 fL (ref 78.0–100.0)
MONO ABS: 0.9 10*3/uL (ref 0.1–1.0)
Monocytes Relative: 8 % (ref 3–12)
Neutro Abs: 7.8 10*3/uL — ABNORMAL HIGH (ref 1.7–7.7)
Neutrophils Relative %: 71 % (ref 43–77)
PLATELETS: 406 10*3/uL — AB (ref 150–400)
RBC: 3.57 MIL/uL — ABNORMAL LOW (ref 3.87–5.11)
RDW: 15.7 % — ABNORMAL HIGH (ref 11.5–15.5)
WBC: 11 10*3/uL — AB (ref 4.0–10.5)

## 2014-05-14 NOTE — Patient Instructions (Signed)
Anemia, Nonspecific Anemia is a condition in which the concentration of red blood cells or hemoglobin in the blood is below normal. Hemoglobin is a substance in red blood cells that carries oxygen to the tissues of the body. Anemia results in not enough oxygen reaching these tissues.  CAUSES  Common causes of anemia include:   Excessive bleeding. Bleeding may be internal or external. This includes excessive bleeding from periods (in women) or from the intestine.   Poor nutrition.   Chronic kidney, thyroid, and liver disease.  Bone marrow disorders that decrease red blood cell production.  Cancer and treatments for cancer.  HIV, AIDS, and their treatments.  Spleen problems that increase red blood cell destruction.  Blood disorders.  Excess destruction of red blood cells due to infection, medicines, and autoimmune disorders. SIGNS AND SYMPTOMS   Minor weakness.   Dizziness.   Headache.  Palpitations.   Shortness of breath, especially with exercise.   Paleness.  Cold sensitivity.  Indigestion.  Nausea.  Difficulty sleeping.  Difficulty concentrating. Symptoms may occur suddenly or they may develop slowly.  DIAGNOSIS  Additional blood tests are often needed. These help your health care provider determine the best treatment. Your health care provider will check your stool for blood and look for other causes of blood loss.  TREATMENT  Treatment varies depending on the cause of the anemia. Treatment can include:   Supplements of iron, vitamin B12, or folic acid.   Hormone medicines.   A blood transfusion. This may be needed if blood loss is severe.   Hospitalization. This may be needed if there is significant continual blood loss.   Dietary changes.  Spleen removal. HOME CARE INSTRUCTIONS Keep all follow-up appointments. It often takes many weeks to correct anemia, and having your health care provider check on your condition and your response to  treatment is very important. SEEK IMMEDIATE MEDICAL CARE IF:   You develop extreme weakness, shortness of breath, or chest pain.   You become dizzy or have trouble concentrating.  You develop heavy vaginal bleeding.   You develop a rash.   You have bloody or black, tarry stools.   You faint.   You vomit up blood.   You vomit repeatedly.   You have abdominal pain.  You have a fever or persistent symptoms for more than 2-3 days.   You have a fever and your symptoms suddenly get worse.   You are dehydrated.  MAKE SURE YOU:  Understand these instructions.  Will watch your condition.  Will get help right away if you are not doing well or get worse. Document Released: 11/16/2004 Document Revised: 06/11/2013 Document Reviewed: 04/04/2013 ExitCare Patient Information 2015 [employer]. This information is not intended to replace advice given to you by your health care provider. Make sure you discuss any questions you have with your health care provider.  

## 2014-05-14 NOTE — Progress Notes (Signed)
Patient ID: Dawn Lawson, female   DOB: 11-12-1928, 80 y.o.   MRN: [id number]   Dawn Lawson, is a 80 y.o. female  ACZ:[id number]  [id number]  DOB - 25-May-1929  Chief Complaint  Patient presents with  . Follow-up        Subjective:   Dawn Lawson is a 80 y.o. female here today for a follow up visit. Patient has history of heart murmur, hypertension and osteoarthritis on wheelchair recently seen at an urgent care for possible pneumonia. She was treated with antibiotics and told to follow up with primary care physician. She is here today for that followup, she has no new complaint, cough has resolved, denies any chest pain or shortness of breath. She is on vitamin D for osteoporosis, noted the urgent care visit she was found to have anemia with hemoglobin of 9.6 lower than her baseline. She has no history of bleeding from any orifice, she denies blood in stool. She claims to have colonoscopy done in the past with normal result. No bleeding per vagina. She does not drink alcohol, she does not smoke cigarette. She claims to be eating well. Patient has No headache, No chest pain, No abdominal pain - No Nausea, No new weakness tingling or numbness, No Cough - SOB.  No problems updated.  ALLERGIES: Allergies  Allergen Reactions  . Ampicillin Hives and Itching  . Penicillins Hives and Itching    PAST MEDICAL HISTORY: Past Medical History  Diagnosis Date  . Arthritis   . Cataract   . Heart murmur     MEDICATIONS AT HOME: Prior to Admission medications   Medication Sig Start Date End Date Taking? Authorizing Provider  acetaminophen (TYLENOL) 650 MG CR tablet Take 1,300 mg by mouth every 8 (eight) hours as needed for pain.    Yes Historical Provider, MD  aspirin EC 81 MG tablet Take 81 mg by mouth daily.   Yes Historical Provider, MD  bisoprolol-hydrochlorothiazide (ZIAC) 2.5-6.25 MG per tablet Take 1 tablet by mouth daily.   Yes Historical Provider, MD  Vitamin D, Ergocalciferol,  (DRISDOL) 50000 UNITS CAPS capsule Take 50,000 Units by mouth every Thursday.   Yes Historical Provider, MD  acyclovir ointment (ZOVIRAX) 5 % Apply topically every 3 (three) hours. Please continue for 2 weeks. 03/26/14   Robbie Lis, MD  azithromycin (ZITHROMAX) 250 MG tablet Two tablets daily x 1 day then one tablet daily x 4 days 04/22/14   Wardell Honour, MD  benzonatate (TESSALON) 100 MG capsule Take 1-2 capsules (100-200 mg total) by mouth 3 (three) times daily as needed for cough. 04/22/14   Wardell Honour, MD  hydroxypropyl methylcellulose (ISOPTO TEARS) 2.5 % ophthalmic solution Place 1 drop into both eyes 3 (three) times daily as needed for dry eyes.    Historical Provider, MD  PRESCRIPTION MEDICATION Inject 1 application as directed every 30 (thirty) days. Pt gets eyes injections through [facility]    Historical Provider, MD  Pseudoephedrine-APAP-DM 30-325-15 MG CAPS Take 2 capsules by mouth at bedtime as needed (sleep).     Historical Provider, MD     Objective:   Filed Vitals:   05/14/14 1218  BP: 165/64  Pulse: 62  Temp: 98.7 F (37.1 C)  TempSrc: Oral  Resp: 14  SpO2: 98%    Exam General appearance : Awake, alert, not in any distress. Speech Clear. Not toxic looking, on wheelchair HEENT: Atraumatic and Normocephalic, pupils equally reactive to light and accomodation Neck: supple, no JVD.  No cervical lymphadenopathy.  Chest:Good air entry bilaterally, no added sounds  CVS: S1 S2 regular, no murmurs.  Abdomen: Bowel sounds present, Non tender and not distended with no gaurding, rigidity or rebound. Extremities: B/L Lower Ext shows no edema, both legs are warm to touch Neurology: Awake alert, and oriented X 3, CN II-XII intact, Non focal Skin:No Rash Wounds:N/A  Data Review No results found for this basename: HGBA1C     Assessment & Plan   1. Iron deficiency anemia  - CBC with Differential - Anemia panel  Patient was counseled extensively on  nutrition Advised over-the-counter iron supplement as well as multivitamin   Return in about 6 months (around 11/14/2014), or if symptoms worsen or fail to improve, for Anemia, , Annual Physical.  The patient was given clear instructions to go to ER or return to medical center if symptoms don't improve, worsen or new problems develop. The patient verbalized understanding. The patient was told to call to get lab results if they haven't heard anything in the next week.   This note has been created with Surveyor, quantity. Any transcriptional errors are unintentional.    Angelica Chessman, MD, [place], [place], [place] and [place], [place]   05/14/2014, 12:50 PM

## 2014-05-14 NOTE — Progress Notes (Signed)
Pt is following up on her bronchitis and arthritis.  Pt was recently diagnosed with anemia. Pt is needing a form so she can have another handicap sticker.

## 2014-05-15 LAB — ANEMIA PANEL
%SAT: 5 % — AB (ref 20–55)
ABS Retic: 46.4 10*3/uL (ref 19.0–186.0)
FERRITIN: 384 ng/mL — AB (ref 10–291)
Folate: 18.1 ng/mL
Iron: 13 ug/dL — ABNORMAL LOW (ref 42–145)
RBC.: 3.57 MIL/uL — ABNORMAL LOW (ref 3.87–5.11)
Retic Ct Pct: 1.3 % (ref 0.4–2.3)
TIBC: 242 ug/dL — AB (ref 250–470)
UIBC: 229 ug/dL (ref 125–400)
VITAMIN B 12: 457 pg/mL (ref 211–911)

## 2014-05-16 ENCOUNTER — Emergency Department: Payer: Self-pay | Admitting: Internal Medicine

## 2014-05-16 DIAGNOSIS — M25469 Effusion, unspecified knee: Secondary | ICD-10-CM | POA: Diagnosis not present

## 2014-05-16 DIAGNOSIS — IMO0002 Reserved for concepts with insufficient information to code with codable children: Secondary | ICD-10-CM | POA: Diagnosis not present

## 2014-06-08 DIAGNOSIS — H353290 Exudative age-related macular degeneration, unspecified eye, stage unspecified: Secondary | ICD-10-CM | POA: Diagnosis not present

## 2014-06-26 ENCOUNTER — Encounter: Payer: Self-pay | Admitting: *Deleted

## 2014-07-10 DIAGNOSIS — H353290 Exudative age-related macular degeneration, unspecified eye, stage unspecified: Secondary | ICD-10-CM | POA: Diagnosis not present

## 2014-07-13 ENCOUNTER — Ambulatory Visit: Payer: Medicare Other | Admitting: Internal Medicine

## 2014-08-05 DIAGNOSIS — H3532 Exudative age-related macular degeneration: Secondary | ICD-10-CM | POA: Diagnosis not present

## 2014-08-11 ENCOUNTER — Ambulatory Visit (INDEPENDENT_AMBULATORY_CARE_PROVIDER_SITE_OTHER): Payer: Medicare Other | Admitting: Family Medicine

## 2014-08-11 VITALS — BP 154/92 | HR 81 | Temp 98.2°F | Resp 17

## 2014-08-11 DIAGNOSIS — J209 Acute bronchitis, unspecified: Secondary | ICD-10-CM

## 2014-08-11 MED ORDER — LIDOCAINE HCL 1 % IJ SOLN: 500.0000 mg | Freq: Every day | INTRAMUSCULAR | Status: DC

## 2014-08-11 MED ORDER — AZITHROMYCIN 250 MG PO TABS: ORAL_TABLET | ORAL | Status: DC

## 2014-08-11 MED ORDER — BENZONATATE 100 MG PO CAPS: 100.0000 mg | ORAL_CAPSULE | Freq: Three times a day (TID) | ORAL | Status: DC | PRN

## 2014-08-11 MED ORDER — CEFTRIAXONE SODIUM 1 G IJ SOLR
500.0000 mg | Freq: Once | INTRAMUSCULAR | Status: AC
  Administered 2014-08-11: 500 mg via INTRAMUSCULAR

## 2014-08-11 NOTE — Progress Notes (Signed)
Subjective:  This chart was scribed for Robyn Haber by Dellis Filbert, ED Scribe. The patient was seen in Exam room 11 and the patient's care was started at 8:26 PM.   Patient ID: Dawn Lawson, female    DOB: [birth year], 80 y.o.   MRN: [id number]  HPI HPI Comments: Dawn Lawson is a 80 y.o. female who presents to [facility] complaining of a cough which began four days ago. She notes last night she had a persistent cough through out the night. Pt takes medication for HTN and vitamin D. Pt notes she lives all by herself. She has a right knee replacement and must walk with a cane. She denies any sputum with her cough, fever, swelling in her feet, sore throat, nasal congestion.  Patient Active Problem List   Diagnosis Date Noted  . Status post PICC central line placement 04/14/2014  . Sepsis due to group B Streptococcus 04/14/2014  . UTI (lower urinary tract infection) 03/22/2014  . Severe sepsis with acute organ dysfunction 03/22/2014  . AKI (acute kidney injury) 03/22/2014  . ATN (acute tubular necrosis) 03/22/2014  . Lactic acidosis 03/22/2014  . Urinary tract infection 03/22/2014  . Cellulitis of right leg 03/22/2014   Past Medical History  Diagnosis Date  . Arthritis   . Cataract   . Heart murmur    Past Surgical History  Procedure Laterality Date  . Appendectomy    . Eye surgery    . Joint replacement    . Abdominal hysterectomy      partial  . Tee without cardioversion N/A 04/09/2014    Procedure: TRANSESOPHAGEAL ECHOCARDIOGRAM (TEE);  Surgeon: Lelon Perla, MD;  Location: [facility] ENDOSCOPY;  Service: Cardiovascular;  Laterality: N/A;   Allergies  Allergen Reactions  . Ampicillin Hives and Itching  . Penicillins Hives and Itching   Prior to Admission medications   Medication Sig Start Date End Date Taking? Authorizing Provider  aspirin EC 81 MG tablet Take 81 mg by mouth daily.   Yes Historical Provider, MD  bisoprolol-hydrochlorothiazide (ZIAC) 2.5-6.25 MG per  tablet Take 1 tablet by mouth daily.   Yes Historical Provider, MD  Vitamin D, Ergocalciferol, (DRISDOL) 50000 UNITS CAPS capsule Take 50,000 Units by mouth every Thursday.   Yes Historical Provider, MD   History   Social History  . Marital Status: Widowed    Spouse Name: N/A    Number of Children: N/A  . Years of Education: N/A   Occupational History  . Not on file.   Social History Main Topics  . Smoking status: Never Smoker   . Smokeless tobacco: Not on file  . Alcohol Use: No  . Drug Use: No  . Sexual Activity: No   Other Topics Concern  . Not on file   Social History Narrative  . No narrative on file    Review of Systems  Constitutional: Negative for fever.  HENT: Negative for sinus pressure and sore throat.   Respiratory: Positive for cough.   Cardiovascular: Negative for leg swelling.  All other systems reviewed and are negative.      Objective:   Physical Exam  Nursing note and vitals reviewed. Constitutional: She is oriented to person, place, and time. She appears well-developed and well-nourished. No distress.  HENT:  Head: Normocephalic and atraumatic.  Eyes: EOM are normal.  Neck: Normal range of motion.  Cardiovascular: Normal rate.   Pulmonary/Chest: Effort normal.  Bilaterally bronchi Spasmatic cough  Musculoskeletal: Normal range of motion.  Mild erythema of the lower extremities with post-phlebitis changes.  Neurological: She is alert and oriented to person, place, and time.  Skin: Skin is warm and dry.  Psychiatric: She has a normal mood and affect. Her behavior is normal.  BP 154/92  Pulse 81  Temp(Src) 98.2 F (36.8 C) (Oral)  Resp 17  SpO2 97%      Assessment & Plan:  I personally performed the services described in this documentation, which was scribed in my presence. The recorded information has been reviewed and is accurate.  Acute bronchitis, unspecified organism - Plan: azithromycin (ZITHROMAX) 250 MG tablet, benzonatate  (TESSALON) 100 MG capsule, cefTRIAXone (ROCEPHIN) 500 mg in lidocaine (XYLOCAINE) 1 % IM only syringe, cefTRIAXone (ROCEPHIN) injection 500 mg  Signed, Robyn Haber, MD

## 2014-09-09 DIAGNOSIS — H3532 Exudative age-related macular degeneration: Secondary | ICD-10-CM | POA: Diagnosis not present

## 2014-10-12 ENCOUNTER — Ambulatory Visit (INDEPENDENT_AMBULATORY_CARE_PROVIDER_SITE_OTHER): Payer: Medicare Other | Admitting: Emergency Medicine

## 2014-10-12 ENCOUNTER — Other Ambulatory Visit (HOSPITAL_COMMUNITY): Payer: Self-pay | Admitting: Emergency Medicine

## 2014-10-12 ENCOUNTER — Ambulatory Visit (HOSPITAL_COMMUNITY)
Admission: RE | Admit: 2014-10-12 | Discharge: 2014-10-12 | Disposition: A | Discharge status: Home / Self Care | Payer: Medicare Other | Source: Ambulatory Visit | Attending: Family Medicine | Admitting: Family Medicine

## 2014-10-12 VITALS — BP 124/80 | HR 77 | Temp 98.0°F | Resp 18 | Wt 169.8 lb

## 2014-10-12 DIAGNOSIS — M25561 Pain in right knee: Secondary | ICD-10-CM | POA: Diagnosis not present

## 2014-10-12 DIAGNOSIS — L0390 Cellulitis, unspecified: Secondary | ICD-10-CM

## 2014-10-12 DIAGNOSIS — M79651 Pain in right thigh: Secondary | ICD-10-CM | POA: Insufficient documentation

## 2014-10-12 DIAGNOSIS — Z96651 Presence of right artificial knee joint: Secondary | ICD-10-CM | POA: Insufficient documentation

## 2014-10-12 DIAGNOSIS — R600 Localized edema: Secondary | ICD-10-CM

## 2014-10-12 DIAGNOSIS — R609 Edema, unspecified: Secondary | ICD-10-CM

## 2014-10-12 DIAGNOSIS — L539 Erythematous condition, unspecified: Secondary | ICD-10-CM | POA: Diagnosis not present

## 2014-10-12 DIAGNOSIS — M79604 Pain in right leg: Secondary | ICD-10-CM | POA: Diagnosis not present

## 2014-10-12 DIAGNOSIS — L03115 Cellulitis of right lower limb: Secondary | ICD-10-CM

## 2014-10-12 LAB — POCT CBC
GRANULOCYTE PERCENT: 69.2 % (ref 37–80)
HCT, POC: 31.7 % — AB (ref 37.7–47.9)
HEMOGLOBIN: 10 g/dL — AB (ref 12.2–16.2)
Lymph, poc: 2.9 (ref 0.6–3.4)
MCH: 26.4 pg — AB (ref 27–31.2)
MCHC: 31.5 g/dL — AB (ref 31.8–35.4)
MCV: 83.7 fL (ref 80–97)
MID (CBC): 0.6 (ref 0–0.9)
MPV: 6.7 fL (ref 0–99.8)
PLATELET COUNT, POC: 414 10*3/uL (ref 142–424)
POC Granulocyte: 8 — AB (ref 2–6.9)
POC LYMPH PERCENT: 25.4 %L (ref 10–50)
POC MID %: 5.4 %M (ref 0–12)
RBC: 3.79 M/uL — AB (ref 4.04–5.48)
RDW, POC: 18.9 %
WBC: 11.5 10*3/uL — AB (ref 4.6–10.2)

## 2014-10-12 MED ORDER — SULFAMETHOXAZOLE-TRIMETHOPRIM 800-160 MG PO TABS: 1.0000 | ORAL_TABLET | Freq: Two times a day (BID) | ORAL | Status: DC

## 2014-10-12 NOTE — Progress Notes (Addendum)
[facility] [address] [place] [address] [phone]  Date:  10/12/2014   Name:  Dawn Lawson   DOB:  03-11-1929   MRN:  [id number]  PCP:  Leisa Lenz, MD    Chief Complaint: Rash   History of Present Illness:  Dawn Lawson is a 80 y.o. very pleasant female patient who presents with the following:  Patient has a two week history of "a bump" on her proximal right lower leg anteriorly. Now has an area of granulation with a stripe on the shin to the foot that appears cellulitis Has unilateral swelling of that leg and foot that waxes and wanes No history of fever or chills No history of injury. No improvement with over the counter medications or other home remedies.  Denies other complaint or health concern today.   Patient Active Problem List   Diagnosis Date Noted  . Status post PICC central line placement 04/14/2014  . Sepsis due to group B Streptococcus 04/14/2014  . UTI (lower urinary tract infection) 03/22/2014  . Severe sepsis with acute organ dysfunction 03/22/2014  . AKI (acute kidney injury) 03/22/2014  . ATN (acute tubular necrosis) 03/22/2014  . Lactic acidosis 03/22/2014  . Urinary tract infection 03/22/2014  . Cellulitis of right leg 03/22/2014    Past Medical History  Diagnosis Date  . Arthritis   . Cataract   . Heart murmur     Past Surgical History  Procedure Laterality Date  . Appendectomy    . Eye surgery    . Joint replacement    . Abdominal hysterectomy      partial  . Tee without cardioversion N/A 04/09/2014    Procedure: TRANSESOPHAGEAL ECHOCARDIOGRAM (TEE);  Surgeon: Lelon Perla, MD;  Location: [facility] ENDOSCOPY;  Service: Cardiovascular;  Laterality: N/A;    History  Substance Use Topics  . Smoking status: Never Smoker   . Smokeless tobacco: Not on file  . Alcohol Use: No    Family History  Problem Relation Age of Onset  . Heart disease Mother   . Cancer Mother   . Heart disease Father      Allergies  Allergen Reactions  . Ampicillin Hives and Itching  . Penicillins Hives and Itching    Medication list has been reviewed and updated.  Current Outpatient Prescriptions on File Prior to Visit  Medication Sig Dispense Refill  . aspirin EC 81 MG tablet Take 81 mg by mouth daily.    . bisoprolol-hydrochlorothiazide (ZIAC) 2.5-6.25 MG per tablet Take 1 tablet by mouth daily.    . Vitamin D, Ergocalciferol, (DRISDOL) 50000 UNITS CAPS capsule Take 50,000 Units by mouth every Thursday.    Marland Kitchen azithromycin (ZITHROMAX) 250 MG tablet Two tablets daily x 1 day then one tablet daily x 4 days (Patient not taking: Reported on 10/12/2014) 6 tablet 0  . benzonatate (TESSALON) 100 MG capsule Take 1-2 capsules (100-200 mg total) by mouth 3 (three) times daily as needed for cough. (Patient not taking: Reported on 10/12/2014) 40 capsule 0   Current Facility-Administered Medications on File Prior to Visit  Medication Dose Route Frequency Provider Last Rate Last Dose  . cefTRIAXone (ROCEPHIN) 500 mg in lidocaine (XYLOCAINE) 1 % IM only syringe  500 mg Intramuscular Q0600 Robyn Haber, MD        Review of Systems:  As per HPI, otherwise negative.    Physical Examination: Filed Vitals:   10/12/14 1100  BP: 124/80  Pulse: 77  Temp:  98 F (36.7 C)  Resp: 18   Filed Vitals:   10/12/14 1100  Weight: 169 lb 12.8 oz (77.021 kg)   Body mass index is 34.28 kg/(m^2). Ideal Body Weight:     GEN: WDWN, NAD, Non-toxic, Alert & Oriented x 3 HEENT: Atraumatic, Normocephalic.  Ears and Nose: No external deformity. EXTR: No clubbing/cyanosis/  Has swelling of right foot and ankle NEURO: Normal gait.  PSYCH: Normally interactive. Conversant. Not depressed or anxious appearing.  Calm demeanor.  Right lower leg:  Anterior "stripe" from knee to ankle with peeling of epidermis.   Assessment and Plan: Cellulitis Septra  Elevate  Signed,  Ellison Carwin, MD   Results for orders placed  or performed in visit on 10/12/14  POCT CBC  Result Value Ref Range   WBC 11.5 (A) 4.6 - 10.2 K/uL   Lymph, poc 2.9 0.6 - 3.4   POC LYMPH PERCENT 25.4 10 - 50 %L   MID (cbc) 0.6 0 - 0.9   POC MID % 5.4 0 - 12 %M   POC Granulocyte 8.0 (A) 2 - 6.9   Granulocyte percent 69.2 37 - 80 %G   RBC 3.79 (A) 4.04 - 5.48 M/uL   Hemoglobin 10.0 (A) 12.2 - 16.2 g/dL   HCT, POC 31.7 (A) 37.7 - 47.9 %   MCV 83.7 80 - 97 fL   MCH, POC 26.4 (A) 27 - 31.2 pg   MCHC 31.5 (A) 31.8 - 35.4 g/dL   RDW, POC 18.9 %   Platelet Count, POC 414 142 - 424 K/uL   MPV 6.7 0 - 99.8 fL   Venous doppler negative

## 2014-10-12 NOTE — Progress Notes (Signed)
VASCULAR LAB PRELIMINARY  PRELIMINARY  PRELIMINARY  PRELIMINARY  Right lower extremity venous duplex completed.    Preliminary report:  Right leg negative for deep and superficial vein thrombosis as it left common femoral..  Report called to [facility], She stated they will call her this PM.   Tylie Golonka, RVT 10/12/2014, 3:51 PM

## 2014-10-12 NOTE — Addendum Note (Signed)
Addended by: Roselee Culver on: 10/12/2014 03:37 PM   Modules accepted: Level of Service

## 2014-10-12 NOTE — Patient Instructions (Addendum)
You are scheduled for your venous doppler [facility] Entrance A Admiting- [place] Today at 2:30pm

## 2014-10-13 ENCOUNTER — Other Ambulatory Visit: Payer: Medicare Other

## 2014-10-14 ENCOUNTER — Ambulatory Visit (INDEPENDENT_AMBULATORY_CARE_PROVIDER_SITE_OTHER): Payer: Medicare Other | Admitting: Physician Assistant

## 2014-10-14 VITALS — BP 150/75 | HR 65 | Temp 99.3°F | Resp 18 | Ht 59.75 in | Wt 169.0 lb

## 2014-10-14 DIAGNOSIS — L03115 Cellulitis of right lower limb: Secondary | ICD-10-CM | POA: Diagnosis not present

## 2014-10-14 DIAGNOSIS — H3530 Unspecified macular degeneration: Secondary | ICD-10-CM | POA: Insufficient documentation

## 2014-10-14 DIAGNOSIS — M1990 Unspecified osteoarthritis, unspecified site: Secondary | ICD-10-CM | POA: Insufficient documentation

## 2014-10-14 DIAGNOSIS — E559 Vitamin D deficiency, unspecified: Secondary | ICD-10-CM | POA: Insufficient documentation

## 2014-10-14 DIAGNOSIS — I10 Essential (primary) hypertension: Secondary | ICD-10-CM | POA: Insufficient documentation

## 2014-10-14 DIAGNOSIS — D649 Anemia, unspecified: Secondary | ICD-10-CM | POA: Insufficient documentation

## 2014-10-14 DIAGNOSIS — E669 Obesity, unspecified: Secondary | ICD-10-CM

## 2014-10-14 NOTE — Progress Notes (Signed)
   Subjective:    Patient ID: Dawn Lawson, female    DOB: [birth year], 80 y.o.   MRN: [id number]   PCP: Leisa Lenz, MD  Chief Complaint  Patient presents with  . Wound Check    Allergies  Allergen Reactions  . Ampicillin Hives and Itching  . Penicillins Hives and Itching    Patient Active Problem List   Diagnosis Date Noted  . Macular degeneration 10/14/2014  . Vitamin D deficiency 10/14/2014  . HTN (hypertension) 10/14/2014  . Anemia 10/14/2014  . Obesity (BMI 30-39.9) 10/14/2014  . Arthritis 10/14/2014  . Cellulitis of right leg 03/22/2014    Prior to Admission medications   Medication Sig Start Date End Date Taking? Authorizing Provider  aspirin EC 81 MG tablet Take 81 mg by mouth daily.   Yes Historical Provider, MD  bisoprolol-hydrochlorothiazide (ZIAC) 2.5-6.25 MG per tablet Take 1 tablet by mouth daily.   Yes Historical Provider, MD  Iron Combinations (IRON COMPLEX PO) Take 1 tablet by mouth daily.   Yes Historical Provider, MD  Multiple Vitamins-Minerals (MULTIVITAMIN PO) Take 1 tablet by mouth daily.   Yes Historical Provider, MD  sulfamethoxazole-trimethoprim (BACTRIM DS,SEPTRA DS) 800-160 MG per tablet Take 1 tablet by mouth 2 (two) times daily. 10/12/14  Yes Roselee Culver, MD  Vitamin D, Ergocalciferol, (DRISDOL) 50000 UNITS CAPS capsule Take 50,000 Units by mouth every Thursday.   Yes Historical Provider, MD    Medical, Surgical, Family and Social History reviewed and updated.  HPI  Presents for follow-up of cellulitis of the RIGHT lower extremity.  On 10/12/14 she received a dose of ceftriaxone and started on Septra DS. Lower extremity doppler was negative for DVT. Tolerating the Septra DS without difficulty. She and her son note considerable improvement in pain, swelling and redness after 4 doses of antibiotic.  Has been keeping the wound covered with a bandage and cleaning with H2O2 twice daily.  No fever, chills.  Review of Systems As  above.    Objective:   Physical Exam  Constitutional: She is oriented to person, place, and time. She appears well-developed and well-nourished. She is active and cooperative. No distress.  BP 150/75 mmHg  Pulse 65  Temp(Src) 99.3 F (37.4 C) (Oral)  Resp 18  Ht 4' 11.75" (1.518 m)  Wt 169 lb (76.658 kg)  BMI 33.27 kg/m2  SpO2 97%   Eyes: Conjunctivae are normal.  Pulmonary/Chest: Effort normal.  Neurological: She is alert and oriented to person, place, and time.  Skin: Skin is warm and dry. Lesion noted.     Psychiatric: She has a normal mood and affect. Her speech is normal and behavior is normal.   CBC from 12/21 reviewed. No culture obtained. Photos taken-see Media tab.       Assessment & Plan:  1. Cellulitis of right lower extremity Improving. Continue Septra DS, rest and elevation. RTC on 12/27 for re-evaluation, sooner if symptoms begin to worsen again.   Fara Chute, PA-C Physician Assistant-Certified [facility]

## 2014-10-14 NOTE — Patient Instructions (Signed)
Be filled with the Christmas Spirit. Get good rest, and elevate the RIGHT leg when you are sitting and relaxing. Let people do for you during your healing. Continue the antibiotic.

## 2014-10-18 ENCOUNTER — Telehealth: Payer: Self-pay | Admitting: Physician Assistant

## 2014-10-18 ENCOUNTER — Ambulatory Visit (INDEPENDENT_AMBULATORY_CARE_PROVIDER_SITE_OTHER): Payer: Medicare Other

## 2014-10-18 ENCOUNTER — Ambulatory Visit (INDEPENDENT_AMBULATORY_CARE_PROVIDER_SITE_OTHER): Payer: Medicare Other | Admitting: Emergency Medicine

## 2014-10-18 VITALS — BP 100/60 | HR 65 | Temp 99.0°F | Resp 16 | Ht 59.75 in | Wt 169.0 lb

## 2014-10-18 DIAGNOSIS — L03115 Cellulitis of right lower limb: Secondary | ICD-10-CM

## 2014-10-18 DIAGNOSIS — L929 Granulomatous disorder of the skin and subcutaneous tissue, unspecified: Secondary | ICD-10-CM | POA: Diagnosis not present

## 2014-10-18 DIAGNOSIS — R600 Localized edema: Secondary | ICD-10-CM

## 2014-10-18 NOTE — Telephone Encounter (Signed)
Spoke with Kasandra Knudsen, the patient's son and caregiver. Will refer to ortho in [facility] for evaluation.  If they don't want to remove the granulation tissue, let us know. We will then refer to Dr. Sarajane Jews (dermatology surgery) for excision.

## 2014-10-18 NOTE — Progress Notes (Signed)
Subjective:    Patient ID: Dawn Lawson, female    DOB: [birth year], 80 y.o.   MRN: [id number]   PCP: Leisa Lenz, MD  Chief Complaint  Patient presents with  . Wound Check    Right leg    Allergies  Allergen Reactions  . Ampicillin Hives and Itching  . Penicillins Hives and Itching    Patient Active Problem List   Diagnosis Date Noted  . Macular degeneration 10/14/2014  . Vitamin D deficiency 10/14/2014  . HTN (hypertension) 10/14/2014  . Anemia 10/14/2014  . Obesity (BMI 30-39.9) 10/14/2014  . Arthritis 10/14/2014  . Cellulitis of right leg 03/22/2014    Prior to Admission medications   Medication Sig Start Date End Date Taking? Authorizing Provider  aspirin EC 81 MG tablet Take 81 mg by mouth daily.   Yes Historical Provider, MD  bisoprolol-hydrochlorothiazide (ZIAC) 2.5-6.25 MG per tablet Take 1 tablet by mouth daily.   Yes Historical Provider, MD  Iron Combinations (IRON COMPLEX PO) Take 1 tablet by mouth daily.   Yes Historical Provider, MD  Multiple Vitamins-Minerals (MULTIVITAMIN PO) Take 1 tablet by mouth daily.   Yes Historical Provider, MD  sulfamethoxazole-trimethoprim (BACTRIM DS,SEPTRA DS) 800-160 MG per tablet Take 1 tablet by mouth 2 (two) times daily. 10/12/14  Yes Roselee Culver, MD  Vitamin D, Ergocalciferol, (DRISDOL) 50000 UNITS CAPS capsule Take 50,000 Units by mouth every Thursday.   Yes Historical Provider, MD    Medical, Surgical, Family and Social History reviewed and updated.  HPI  Presents for re-evaluation of cellulitis of the RIGHT lower leg. She was seen here 12/21 with a red streak down the front of the lower leg and swelling x 2 weeks, and then the developed a "bump" just distal to the knee. She received a dose of ceftriaxone IM and started on Septra DS.  Re-evaluation on 12/23 revealed reduced redness, swelling and pain. The "bump, "a granulation tissue mass, was unchanged.  Today she reports additional considerable improvement,  but the proud flesh is unchanged. She continues to tolerate the Septra DS. No fever, chills. No GI symptoms.  Review of Systems As above.    Objective:   Physical Exam  Constitutional: She is oriented to person, place, and time. She appears well-developed and well-nourished. She is active and cooperative. No distress.  BP 100/60 mmHg  Pulse 65  Temp(Src) 99 F (37.2 C) (Oral)  Resp 16  Ht 4' 11.75" (1.518 m)  Wt 169 lb (76.658 kg)  BMI 33.27 kg/m2  SpO2 97%   Eyes: Conjunctivae are normal.  Pulmonary/Chest: Effort normal.  Neurological: She is alert and oriented to person, place, and time.  Skin: Skin is warm and dry. Lesion noted. There is erythema.     Granulation tissue growth at the proximal RIGHT lower extremity involving the distal pole of a well-healed TKR scar. Non-tender. Drainage is serous, non-purulent. Erythema surrounding this area is greatly reduced/nearly resolved. Erythema overall is improved, but persists in the lower half of the lower leg, where edema persists. The lower anterior tibia, just superior to the ankle is tender on palpation.  Psychiatric: She has a normal mood and affect. Her speech is normal and behavior is normal.    RIGHT TIB-FIB: [facility] reading (PRIMARY) by  Dr. Everlene Farrier. No evidence of osteomyelitis.  New photos taken and compared to those taken 10/14/14. See the Media tab.      Assessment & Plan:  1. Cellulitis of right lower extremity 2. Edema of right  lower extremity 3. Proud flesh She is s/p RIGHT TKR (performed out of town) and has resolving cellulitis of the lower leg. The granulation tissue growth needs excision. However, given the proximity to the artificial knee and lack of redundant tissue at the site, she needs specialty care.  In the event that ortho doesn't want/need to do the procedure, would send to Dr. Sarajane Jews (dermatology surgeon).  - DG Tibia/Fibula Right; Future -AMB REF Orthopedics  Seen with Dr. Everlene Farrier, who developed this  treatment plan.   Fara Chute, PA-C Physician Assistant-Certified [facility]

## 2014-10-18 NOTE — Telephone Encounter (Signed)
Over read of tib-fib film reveals mottled appearance of and area of the proximal tibia, possibly effect of reduced bone mineralization vs. Chronic infection vs. Malignancy.  Needs MRI. Granulation tissue needs pathology evaluation.  Discussed with Son, Kasandra Knudsen.

## 2014-10-21 ENCOUNTER — Telehealth: Payer: Self-pay

## 2014-10-21 DIAGNOSIS — L03115 Cellulitis of right lower limb: Secondary | ICD-10-CM

## 2014-10-21 DIAGNOSIS — H3532 Exudative age-related macular degeneration: Secondary | ICD-10-CM | POA: Diagnosis not present

## 2014-10-22 NOTE — Telephone Encounter (Signed)
See if we can order a bone scan with attention to the right leg rule out osteomyelitis since the patient is not able to have an MRI

## 2014-10-22 NOTE — Telephone Encounter (Signed)
Dr. Eilleen Kempf Had to cancel the MRI on this pt. She says that she has lawn mower blades fragments in her leg and she was told to never have an MRI done. Please advise of next step for pt. Thanks

## 2014-10-22 NOTE — Telephone Encounter (Signed)
Nuclear medicine bone scan limited attention to right leg ordered to r/o osteomyelitis

## 2014-10-23 NOTE — Telephone Encounter (Signed)
Let's see if we can get the bone scan done. That will give [place] more direction as to who would be the best to see the patient. Let me know if you need me to talk to referrals on Monday .

## 2014-10-26 ENCOUNTER — Telehealth: Payer: Self-pay

## 2014-10-26 DIAGNOSIS — L03115 Cellulitis of right lower limb: Secondary | ICD-10-CM

## 2014-10-26 NOTE — Telephone Encounter (Signed)
Has bone scan been scheduled yet?  Please call the patient or her son for an update on how she is doing.

## 2014-10-26 NOTE — Telephone Encounter (Signed)
Patients son Dawn Lawson is requesting that we change his mothers MRI of right knee. To something else. She has metal on the back of her leg from previous surgery 6 years ago. This is concerning the patient and the son. Please advise other options to check patients knee. Referral was place for mri on 10/21/14 by Chelle. Patient see's Dr. Everlene Farrier as well.   Please call son back, he speaks on behalf of his mother she is unable to reach the home telephone number (due to her leg) so call cell phone which is [name]: Cell: [phone]

## 2014-10-27 NOTE — Telephone Encounter (Signed)
Referrals is working on the bone scan

## 2014-10-27 NOTE — Telephone Encounter (Signed)
Checked with Butch Penny- Referrals is working on scheduling this.  Please call Dawn Lawson with the schedule for the bone scan. Update on pt- Spoke to Dawn Lawson (son)- pt seems to be doing better. Redness has decreased. Pain has significantly decreased and the drainage is less.

## 2014-10-27 NOTE — Telephone Encounter (Signed)
Please call the son, Kasandra Knudsen.  See other phone messages.  Due to the metal in her knee, we cancelled the MRI and ordered a bone scan instead. I thought it would have been scheduled by now. Please investigate and notify [name].

## 2014-10-27 NOTE — Telephone Encounter (Signed)
We are cancelling the Mri and doing a bone scan.

## 2014-11-04 ENCOUNTER — Encounter (HOSPITAL_COMMUNITY)
Admission: RE | Admit: 2014-11-04 | Discharge: 2014-11-04 | Disposition: A | Discharge status: Home / Self Care | Payer: Medicare Other | Source: Ambulatory Visit | Attending: Emergency Medicine | Admitting: Emergency Medicine

## 2014-11-04 DIAGNOSIS — L03115 Cellulitis of right lower limb: Secondary | ICD-10-CM | POA: Insufficient documentation

## 2014-11-04 DIAGNOSIS — R948 Abnormal results of function studies of other organs and systems: Secondary | ICD-10-CM | POA: Diagnosis not present

## 2014-11-04 MED ORDER — TECHNETIUM TC 99M MEDRONATE IV KIT
25.0000 | PACK | Freq: Once | INTRAVENOUS | Status: AC | PRN
  Administered 2014-11-04: 25 via INTRAVENOUS

## 2014-11-05 ENCOUNTER — Telehealth: Payer: Self-pay

## 2014-11-05 NOTE — Telephone Encounter (Signed)
Patients son Kasandra Knudsen) called stating his mother is too old for Ortho, and he does not feel Dermatology is going to help his mother. He also does not feel RTC  is going to be beneficial unless the spot is going to be removed and he does not want his mom sitting in the lobby for hours. I offered an appt and he declined as well.

## 2014-11-06 NOTE — Telephone Encounter (Signed)
Dawn Lawson  did you get this note on Dawn Lawson. I believe we need to talk with the patient directly. She needs to have the lesion removed.

## 2014-11-09 NOTE — Telephone Encounter (Signed)
Patient's son will be bringing her in on Wed after 4 o'clock. Just an FYI.

## 2014-11-09 NOTE — Telephone Encounter (Signed)
-----   Message from Darlyne Russian, MD sent at 11/06/2014  7:51 AM EST ----- Regarding: RE: Referral Do you  mind calling and talking to Mrs. Harmon and find out what she would like [place] to do ----- Message -----    From: Lesleigh Noe    Sent: 11/05/2014   4:25 PM      To: Fara Chute, PA-C, Darlyne Russian, MD, # Subject: Referral                                       Patients son Kasandra Knudsen) called stating his mother is too old for Ortho, and he does not feel Dermatology is going to help his mother. He also does not feel RTC  is going to be beneficial unless the spot is going to be removed and he does not want his mom sitting in the lobby for hours. I offered an appt and he declined as well.

## 2014-11-09 NOTE — Telephone Encounter (Signed)
I also am available to take a look at her leg and make sure the derm referral is put in. Thank you for your help

## 2014-11-09 NOTE — Telephone Encounter (Signed)
Spoke with [employer]. He notes that the area is smaller than previously and is not longer "angry" or warm to the touch, but it is still there and draining. He also reports that he requested an appointment, but was told the first available appointment with me is in February.  He agrees that she needs to see Dermatology to have the lesion removed, and I agree that it's reasonable to hold off on the orthopedics visit until after that.  He says they would be unlikely to treat bursitis and as long as the lesion removal improves things quickly, that's all they want to do.  1. Proceed with Dermatology referral for lesion excision. 2. Kasandra Knudsen will bring her in this week. If today or Wednesday, he is to bring her to [facility] and ask that I be notified that they have arrived. I will expedite her getting back for evaluation. If he can bring her on Tuesday morning, I will work her on to my clinic at [facility] (please let me know and I'll help select the time based on the existing appointments.

## 2014-11-11 ENCOUNTER — Ambulatory Visit (INDEPENDENT_AMBULATORY_CARE_PROVIDER_SITE_OTHER): Payer: Medicare Other | Admitting: Physician Assistant

## 2014-11-11 VITALS — BP 165/74 | HR 68 | Temp 98.3°F | Resp 16

## 2014-11-11 DIAGNOSIS — L03115 Cellulitis of right lower limb: Secondary | ICD-10-CM

## 2014-11-11 DIAGNOSIS — L929 Granulomatous disorder of the skin and subcutaneous tissue, unspecified: Secondary | ICD-10-CM | POA: Diagnosis not present

## 2014-11-11 NOTE — Progress Notes (Signed)
   Subjective:    Patient ID: Dawn Lawson, female    DOB: 03/28/1929, 81 y.o.   MRN: [id number]   PCP: Leisa Lenz, MD  Chief Complaint  Patient presents with  . Follow-up  . Wound Check    Allergies  Allergen Reactions  . Ampicillin Hives and Itching  . Penicillins Hives and Itching    Patient Active Problem List   Diagnosis Date Noted  . Macular degeneration 10/14/2014  . Vitamin D deficiency 10/14/2014  . HTN (hypertension) 10/14/2014  . Anemia 10/14/2014  . Obesity (BMI 30-39.9) 10/14/2014  . Arthritis 10/14/2014  . Cellulitis of right leg 03/22/2014    Prior to Admission medications   Medication Sig Start Date End Date Taking? Authorizing Provider  aspirin EC 81 MG tablet Take 81 mg by mouth daily.   Yes Historical Provider, MD  bisoprolol-hydrochlorothiazide (ZIAC) 2.5-6.25 MG per tablet Take 1 tablet by mouth daily.   Yes Historical Provider, MD  Iron Combinations (IRON COMPLEX PO) Take 1 tablet by mouth daily.   Yes Historical Provider, MD  Multiple Vitamins-Minerals (MULTIVITAMIN PO) Take 1 tablet by mouth daily.   Yes Historical Provider, MD  Vitamin D, Ergocalciferol, (DRISDOL) 50000 UNITS CAPS capsule Take 50,000 Units by mouth every Thursday.   Yes Historical Provider, MD    Medical, Surgical, Family and Social History reviewed and updated.  HPI Presents for re-evaluation of RIGHT lower leg erythema, edema and granulation tissue lesion. She completed the course of Septra DS and both the patient and her son report considerable improvement in redness, swelling, tenderness and drainage. Less "angry lookin." However, all persist.  Bone scan revealed regional hyperemia, favoring synovitis. Referral to dermatology has been made for excision of the lesion.  Then, if any symptoms persist, will proceed with orthopedics evaluation.   Review of Systems     Objective:   Physical Exam  Constitutional: She is oriented to person, place, and time. She appears  well-developed and well-nourished. She is active and cooperative. No distress.  BP 165/74 mmHg  Pulse 68  Temp(Src) 98.3 F (36.8 C) (Oral)  Resp 16  SpO2 97% Seated in a wheelchair. Accompanied by her son.   Eyes: Conjunctivae are normal.  Pulmonary/Chest: Effort normal.  Neurological: She is alert and oriented to person, place, and time.  Skin: Skin is warm and dry. Lesion noted.  See photo in media. Erythematous streak down the anterior tibia is unchanged to my eye. No change in size of granulomatous lesion. Skin tear consistent with adhesive bandage trauma noted below the lesion. Thin serous drainage is present and drips down the leg, crusting noted. Thicker, opaque drainage collects over several minutes and is collected for culture.  1+ LE edema on the RIGHT to the mid-foot.  Psychiatric: She has a normal mood and affect. Her speech is normal and behavior is normal.          Assessment & Plan:  1. Cellulitis of leg, right Await culture results. Repeat antibiotic treatment as indicated. - Wound culture  2. Proud flesh Proceed with dermatology referral for excision.   Fara Chute, PA-C Physician Assistant-Certified [facility]

## 2014-11-12 NOTE — Telephone Encounter (Signed)
Pt has an appt with dr Allyson Sabal on 12/02/14 at 1050 and patient son is aware of appt

## 2014-11-14 LAB — WOUND CULTURE
Gram Stain: NONE SEEN
Gram Stain: NONE SEEN

## 2014-11-15 MED ORDER — CLINDAMYCIN HCL 300 MG PO CAPS: 300.0000 mg | ORAL_CAPSULE | Freq: Three times a day (TID) | ORAL | Status: DC

## 2014-11-15 NOTE — Addendum Note (Signed)
Addended by: Constance Goltz on: 11/15/2014 02:42 PM   Modules accepted: Orders

## 2014-12-02 DIAGNOSIS — D485 Neoplasm of uncertain behavior of skin: Secondary | ICD-10-CM | POA: Diagnosis not present

## 2014-12-04 DIAGNOSIS — H3532 Exudative age-related macular degeneration: Secondary | ICD-10-CM | POA: Diagnosis not present

## 2014-12-22 IMAGING — CR DG CHEST 2V
2 series · 2 of 2 positions shown · non-contrast
Comparison: 03/22/2014

CLINICAL DATA: Cough.  Recent hospitalization.

EXAM:
CHEST  2 VIEW

[PA]
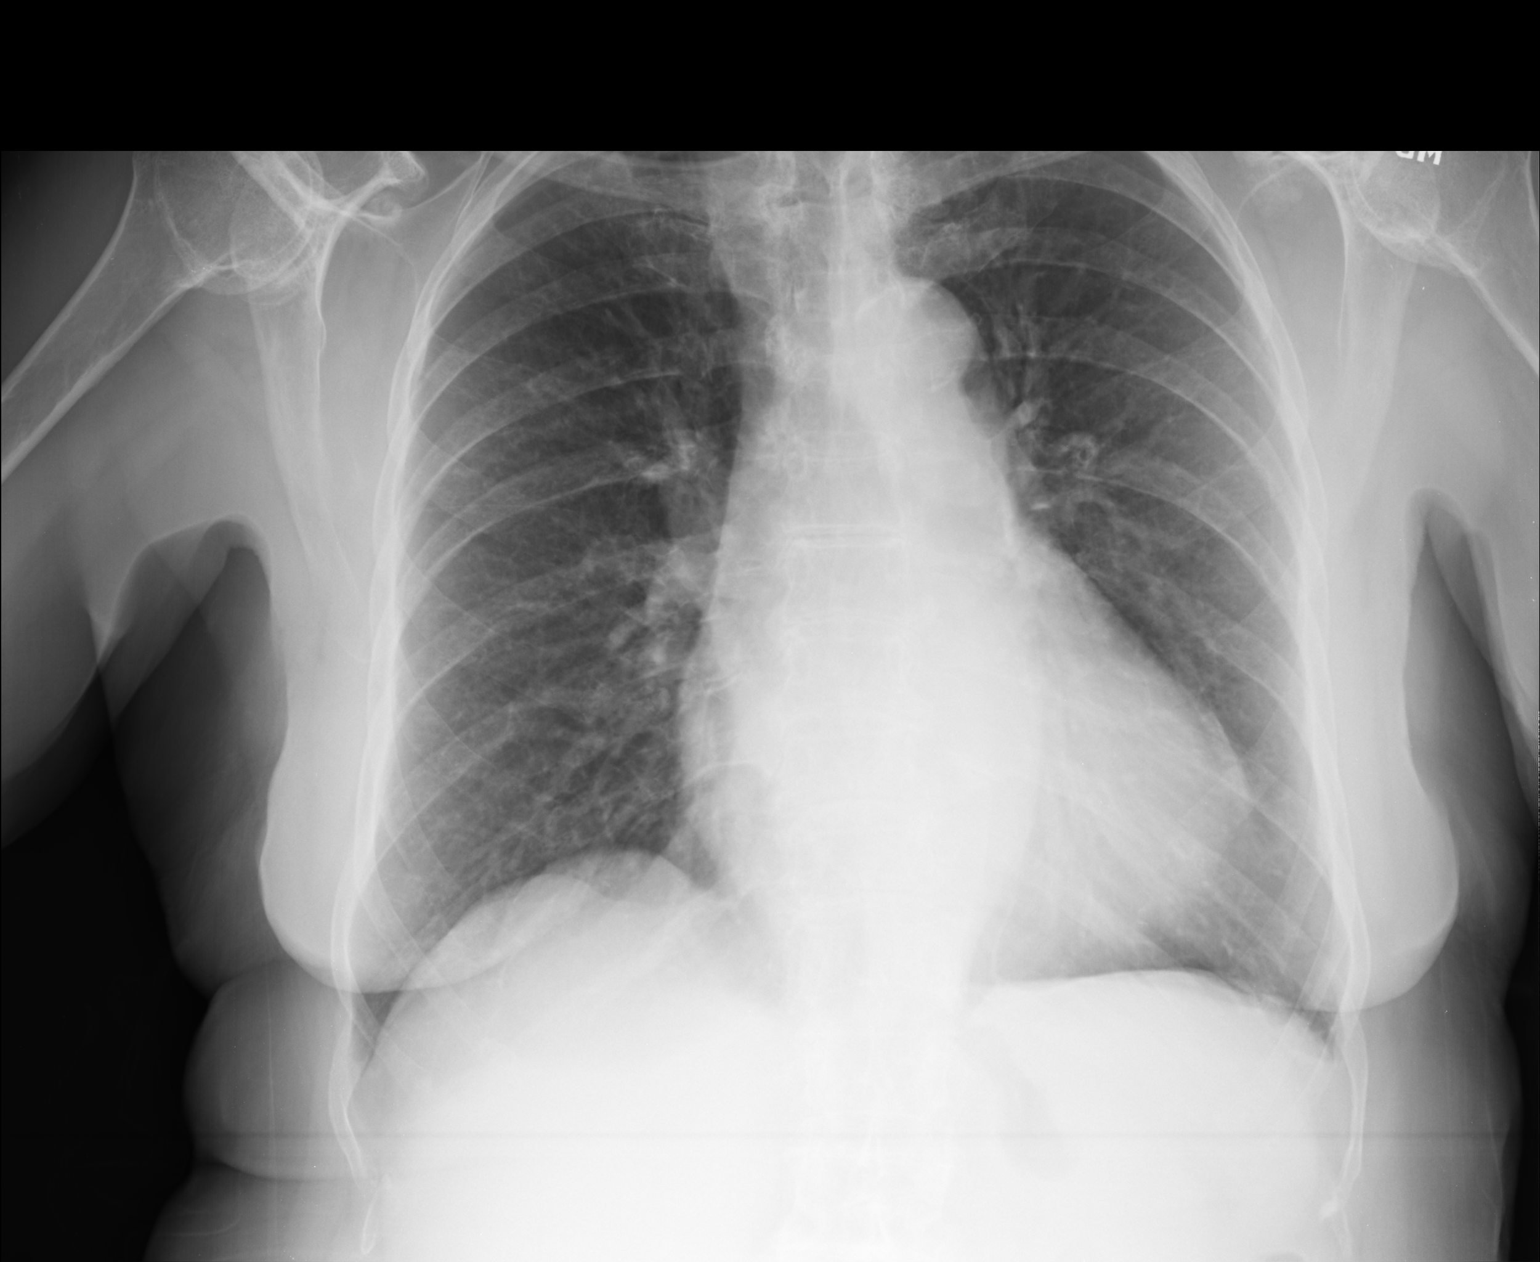

[lateral]
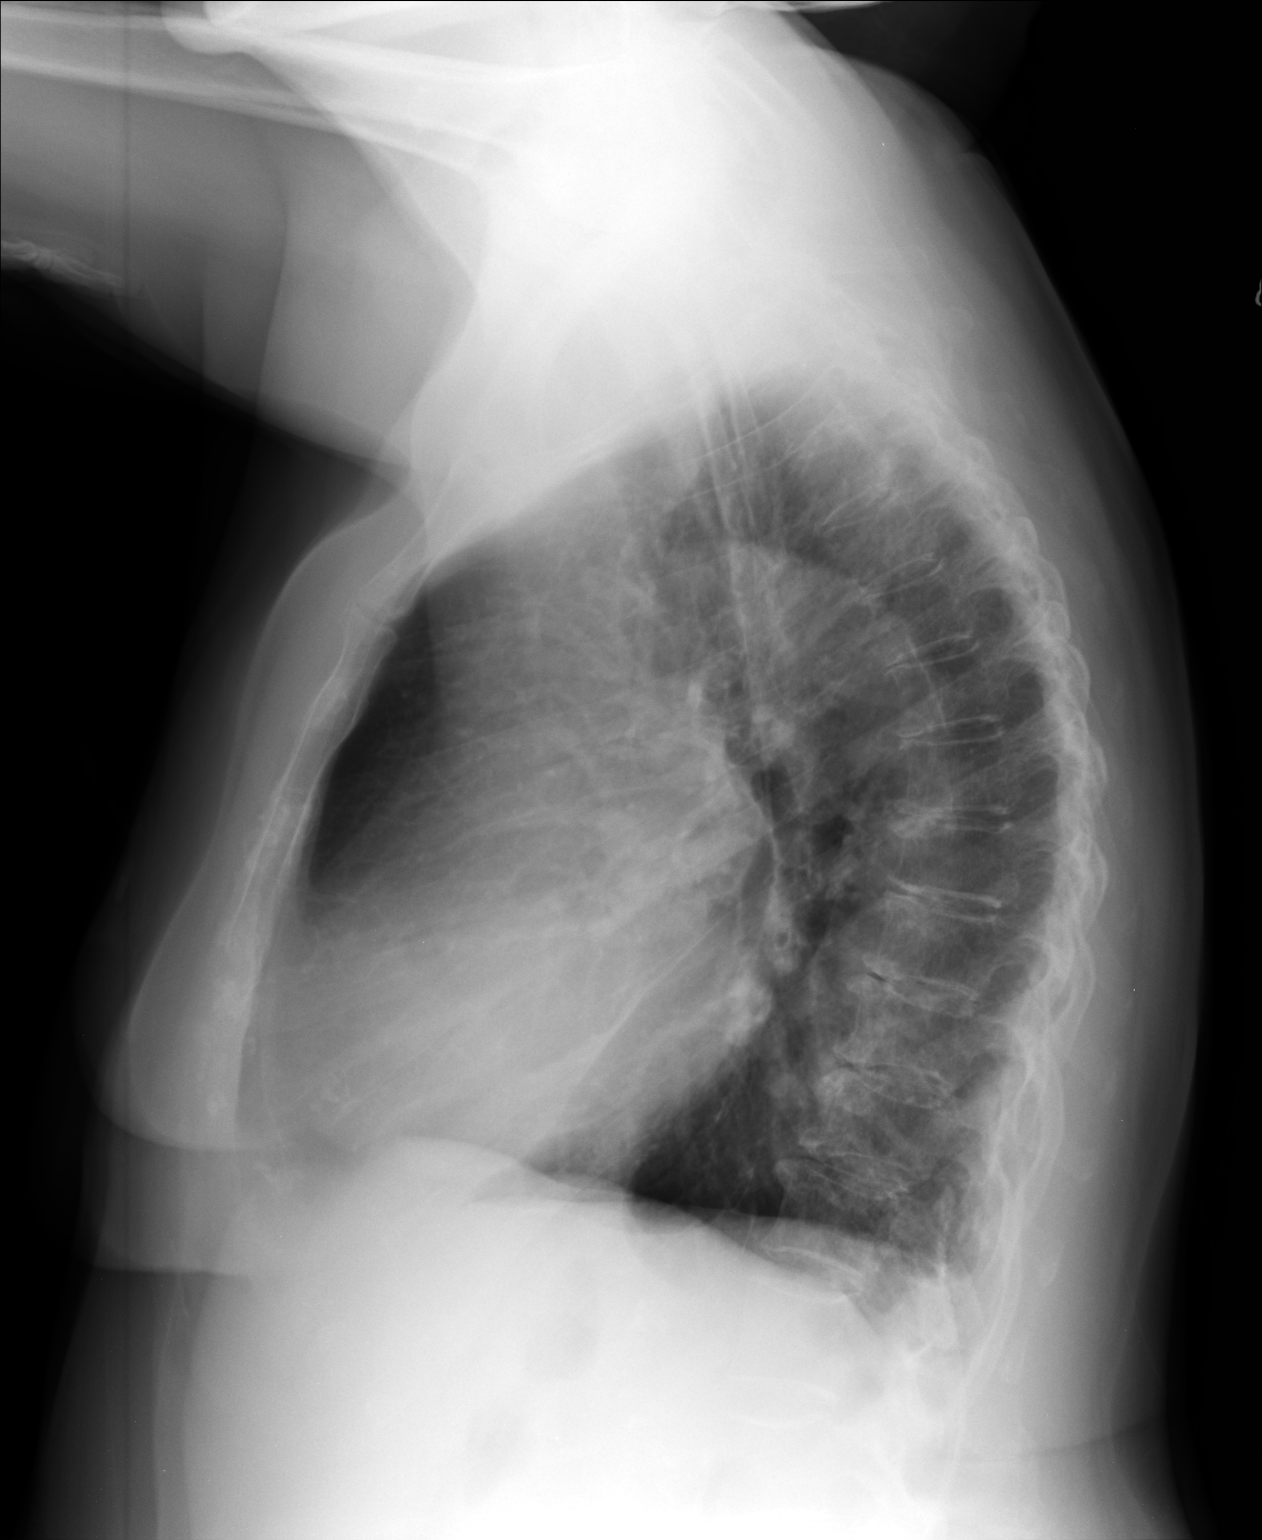

[2 of 2 positions shown; findings below may reference images not displayed]

FINDINGS: There is cardiomegaly which is chronic. The aorta is mildly
tortuous. No consolidation, edema, significant effusion, or
pneumothorax.
IMPRESSION: No active cardiopulmonary disease.

## 2014-12-30 DIAGNOSIS — I789 Disease of capillaries, unspecified: Secondary | ICD-10-CM | POA: Diagnosis not present

## 2014-12-30 DIAGNOSIS — D485 Neoplasm of uncertain behavior of skin: Secondary | ICD-10-CM | POA: Diagnosis not present

## 2015-01-22 DIAGNOSIS — H3532 Exudative age-related macular degeneration: Secondary | ICD-10-CM | POA: Diagnosis not present

## 2015-03-10 DIAGNOSIS — H3532 Exudative age-related macular degeneration: Secondary | ICD-10-CM | POA: Diagnosis not present

## 2015-04-21 DIAGNOSIS — H3532 Exudative age-related macular degeneration: Secondary | ICD-10-CM | POA: Diagnosis not present

## 2015-05-31 DIAGNOSIS — H3532 Exudative age-related macular degeneration: Secondary | ICD-10-CM | POA: Diagnosis not present

## 2015-07-26 DIAGNOSIS — H353211 Exudative age-related macular degeneration, right eye, with active choroidal neovascularization: Secondary | ICD-10-CM | POA: Diagnosis not present

## 2015-08-11 ENCOUNTER — Telehealth: Payer: Self-pay | Admitting: Family Medicine

## 2015-08-11 NOTE — Telephone Encounter (Signed)
SPOKE WITH PATIENTS SON, AND HE STATED THAT SHE SEES DR LITTLE FOR HER PCP.

## 2015-09-27 DIAGNOSIS — H353212 Exudative age-related macular degeneration, right eye, with inactive choroidal neovascularization: Secondary | ICD-10-CM | POA: Diagnosis not present

## 2015-09-27 DIAGNOSIS — H353122 Nonexudative age-related macular degeneration, left eye, intermediate dry stage: Secondary | ICD-10-CM | POA: Diagnosis not present

## 2015-12-06 DIAGNOSIS — H353212 Exudative age-related macular degeneration, right eye, with inactive choroidal neovascularization: Secondary | ICD-10-CM | POA: Diagnosis not present

## 2016-01-10 DIAGNOSIS — Z79899 Other long term (current) drug therapy: Secondary | ICD-10-CM | POA: Diagnosis not present

## 2016-01-10 DIAGNOSIS — M65161 Other infective (teno)synovitis, right knee: Secondary | ICD-10-CM | POA: Diagnosis not present

## 2016-02-28 DIAGNOSIS — H353212 Exudative age-related macular degeneration, right eye, with inactive choroidal neovascularization: Secondary | ICD-10-CM | POA: Diagnosis not present

## 2016-03-01 DIAGNOSIS — E119 Type 2 diabetes mellitus without complications: Secondary | ICD-10-CM | POA: Diagnosis not present

## 2016-03-01 DIAGNOSIS — I10 Essential (primary) hypertension: Secondary | ICD-10-CM | POA: Diagnosis not present

## 2016-05-30 DIAGNOSIS — M65161 Other infective (teno)synovitis, right knee: Secondary | ICD-10-CM | POA: Diagnosis not present

## 2016-05-30 DIAGNOSIS — E119 Type 2 diabetes mellitus without complications: Secondary | ICD-10-CM | POA: Diagnosis not present

## 2016-06-19 ENCOUNTER — Other Ambulatory Visit: Payer: Self-pay

## 2016-06-30 DIAGNOSIS — H353212 Exudative age-related macular degeneration, right eye, with inactive choroidal neovascularization: Secondary | ICD-10-CM | POA: Diagnosis not present

## 2016-09-02 ENCOUNTER — Ambulatory Visit: Payer: Self-pay | Admitting: Orthopedic Surgery

## 2016-09-15 ENCOUNTER — Encounter (HOSPITAL_COMMUNITY)
Admission: RE | Admit: 2016-09-15 | Discharge: 2016-09-15 | Disposition: A | Discharge status: Home / Self Care | Payer: Medicare Other | Source: Ambulatory Visit | Attending: Orthopedic Surgery | Admitting: Orthopedic Surgery

## 2016-09-15 ENCOUNTER — Encounter (HOSPITAL_COMMUNITY): Payer: Self-pay

## 2016-09-15 DIAGNOSIS — Z01812 Encounter for preprocedural laboratory examination: Secondary | ICD-10-CM

## 2016-09-15 DIAGNOSIS — R9431 Abnormal electrocardiogram [ECG] [EKG]: Secondary | ICD-10-CM | POA: Insufficient documentation

## 2016-09-15 DIAGNOSIS — I517 Cardiomegaly: Secondary | ICD-10-CM

## 2016-09-15 DIAGNOSIS — T8453XA Infection and inflammatory reaction due to internal right knee prosthesis, initial encounter: Secondary | ICD-10-CM | POA: Insufficient documentation

## 2016-09-15 DIAGNOSIS — Z01810 Encounter for preprocedural cardiovascular examination: Secondary | ICD-10-CM | POA: Insufficient documentation

## 2016-09-15 DIAGNOSIS — Z96651 Presence of right artificial knee joint: Secondary | ICD-10-CM

## 2016-09-15 HISTORY — DX: Essential (primary) hypertension: I10

## 2016-09-15 HISTORY — DX: Other specified postprocedural states: Z98.890

## 2016-09-15 HISTORY — DX: Other specified postprocedural states: R11.2

## 2016-09-15 LAB — BASIC METABOLIC PANEL
Anion gap: 8 (ref 5–15)
BUN: 24 mg/dL — AB (ref 6–20)
CHLORIDE: 103 mmol/L (ref 101–111)
CO2: 25 mmol/L (ref 22–32)
CREATININE: 0.86 mg/dL (ref 0.44–1.00)
Calcium: 9.4 mg/dL (ref 8.9–10.3)
GFR calc non Af Amer: 60 mL/min — ABNORMAL LOW (ref >60)
Glucose, Bld: 133 mg/dL — ABNORMAL HIGH (ref 65–99)
Potassium: 5 mmol/L (ref 3.5–5.1)
Sodium: 136 mmol/L (ref 135–145)

## 2016-09-15 LAB — CBC
HEMATOCRIT: 36.8 % (ref 36.0–46.0)
HEMOGLOBIN: 11.8 g/dL — AB (ref 12.0–15.0)
MCH: 28.7 pg (ref 26.0–34.0)
MCHC: 32.1 g/dL (ref 30.0–36.0)
MCV: 89.5 fL (ref 78.0–100.0)
Platelets: 294 10*3/uL (ref 150–400)
RBC: 4.11 MIL/uL (ref 3.87–5.11)
RDW: 14.9 % (ref 11.5–15.5)
WBC: 10.5 10*3/uL (ref 4.0–10.5)

## 2016-09-15 LAB — SURGICAL PCR SCREEN
MRSA, PCR: NEGATIVE
STAPHYLOCOCCUS AUREUS: NEGATIVE

## 2016-09-15 LAB — ABO/RH: ABO/RH(D): O POS

## 2016-09-15 LAB — TYPE AND SCREEN
ABO/RH(D): O POS
Antibody Screen: NEGATIVE

## 2016-09-15 LAB — GLUCOSE, CAPILLARY: Glucose-Capillary: 128 mg/dL — ABNORMAL HIGH (ref 65–99)

## 2016-09-15 NOTE — Pre-Procedure Instructions (Signed)
    Dawn Lawson  09/15/2016      [id number] [address], [place] [address] Phone: [phone] Fax: [phone]  [place], [place] [address] Phone: [phone] Fax: [phone]    Your procedure is scheduled on 09/18/16.  Report to [facility] Admitting at [facility] A.M.  Call this number if you have problems the morning of surgery:  [phone]   Remember:  Do not eat food or drink liquids after midnight.  Take these medicines the morning of surgery with A SIP OF WATER ---tylenol,keflex   Do not wear jewelry, make-up or nail polish.  Do not wear lotions, powders, or perfumes, or deoderant.  Do not shave 48 hours prior to surgery.  Men may shave face and neck.  Do not bring valuables to the hospital.  [facility] is not responsible for any belongings or valuables.  Contacts, dentures or bridgework may not be worn into surgery.  Leave your suitcase in the car.  After surgery it may be brought to your room.  For patients admitted to the hospital, discharge time will be determined by your treatment team.  Patients discharged the day of surgery will not be allowed to drive home.   Name and phone number of your driver:    Special instructions:  Do not take any aspirin,anti-inflammatories,vitamins,or herbal supplements 5-7 days prior to surgery.  Please read over the following fact sheets that you were given. MRSA Information

## 2016-09-17 MED ORDER — VANCOMYCIN HCL 10 G IV SOLR
1500.0000 mg | INTRAVENOUS | Status: AC
  Administered 2016-09-18: 1500 mg via INTRAVENOUS
  Filled 2016-09-17: qty 1500

## 2016-09-18 ENCOUNTER — Inpatient Hospital Stay (HOSPITAL_COMMUNITY): Payer: Medicare Other | Admitting: Anesthesiology

## 2016-09-18 ENCOUNTER — Encounter (HOSPITAL_COMMUNITY)
Admission: RE | Disposition: A | Discharge status: Skilled Nursing Facility | Payer: Self-pay | Source: Ambulatory Visit | Attending: Orthopedic Surgery

## 2016-09-18 ENCOUNTER — Encounter (HOSPITAL_COMMUNITY): Payer: Self-pay | Admitting: Anesthesiology

## 2016-09-18 ENCOUNTER — Inpatient Hospital Stay (HOSPITAL_COMMUNITY): Payer: Medicare Other

## 2016-09-18 ENCOUNTER — Inpatient Hospital Stay (HOSPITAL_COMMUNITY)
Admission: RE | Admit: 2016-09-18 | Discharge: 2016-09-22 | DRG: 464 | Disposition: A | Discharge status: Skilled Nursing Facility | Payer: Medicare Other | Source: Ambulatory Visit | Attending: Orthopedic Surgery | Admitting: Orthopedic Surgery

## 2016-09-18 DIAGNOSIS — E559 Vitamin D deficiency, unspecified: Secondary | ICD-10-CM | POA: Diagnosis not present

## 2016-09-18 DIAGNOSIS — R011 Cardiac murmur, unspecified: Secondary | ICD-10-CM | POA: Diagnosis present

## 2016-09-18 DIAGNOSIS — Z6836 Body mass index (BMI) 36.0-36.9, adult: Secondary | ICD-10-CM | POA: Diagnosis not present

## 2016-09-18 DIAGNOSIS — B951 Streptococcus, group B, as the cause of diseases classified elsewhere: Secondary | ICD-10-CM | POA: Diagnosis present

## 2016-09-18 DIAGNOSIS — Z4733 Aftercare following explantation of knee joint prosthesis: Secondary | ICD-10-CM | POA: Diagnosis not present

## 2016-09-18 DIAGNOSIS — Z7982 Long term (current) use of aspirin: Secondary | ICD-10-CM

## 2016-09-18 DIAGNOSIS — T8453XA Infection and inflammatory reaction due to internal right knee prosthesis, initial encounter: Secondary | ICD-10-CM | POA: Diagnosis not present

## 2016-09-18 DIAGNOSIS — Z87440 Personal history of urinary (tract) infections: Secondary | ICD-10-CM | POA: Diagnosis not present

## 2016-09-18 DIAGNOSIS — B999 Unspecified infectious disease: Secondary | ICD-10-CM | POA: Diagnosis not present

## 2016-09-18 DIAGNOSIS — Z96651 Presence of right artificial knee joint: Secondary | ICD-10-CM | POA: Diagnosis present

## 2016-09-18 DIAGNOSIS — Z880 Allergy status to penicillin: Secondary | ICD-10-CM

## 2016-09-18 DIAGNOSIS — D649 Anemia, unspecified: Secondary | ICD-10-CM | POA: Diagnosis not present

## 2016-09-18 DIAGNOSIS — D62 Acute posthemorrhagic anemia: Secondary | ICD-10-CM | POA: Diagnosis not present

## 2016-09-18 DIAGNOSIS — D72829 Elevated white blood cell count, unspecified: Secondary | ICD-10-CM | POA: Diagnosis present

## 2016-09-18 DIAGNOSIS — M869 Osteomyelitis, unspecified: Secondary | ICD-10-CM | POA: Diagnosis present

## 2016-09-18 DIAGNOSIS — E669 Obesity, unspecified: Secondary | ICD-10-CM | POA: Diagnosis present

## 2016-09-18 DIAGNOSIS — M1990 Unspecified osteoarthritis, unspecified site: Secondary | ICD-10-CM | POA: Diagnosis not present

## 2016-09-18 DIAGNOSIS — Y831 Surgical operation with implant of artificial internal device as the cause of abnormal reaction of the patient, or of later complication, without mention of misadventure at the time of the procedure: Secondary | ICD-10-CM | POA: Diagnosis present

## 2016-09-18 DIAGNOSIS — I10 Essential (primary) hypertension: Secondary | ICD-10-CM | POA: Diagnosis not present

## 2016-09-18 DIAGNOSIS — M25569 Pain in unspecified knee: Secondary | ICD-10-CM | POA: Diagnosis not present

## 2016-09-18 DIAGNOSIS — Z09 Encounter for follow-up examination after completed treatment for conditions other than malignant neoplasm: Secondary | ICD-10-CM

## 2016-09-18 DIAGNOSIS — H3530 Unspecified macular degeneration: Secondary | ICD-10-CM | POA: Diagnosis present

## 2016-09-18 DIAGNOSIS — T8450XD Infection and inflammatory reaction due to unspecified internal joint prosthesis, subsequent encounter: Secondary | ICD-10-CM | POA: Diagnosis not present

## 2016-09-18 HISTORY — PX: EXCISIONAL TOTAL KNEE ARTHROPLASTY WITH ANTIBIOTIC SPACERS: SHX5827

## 2016-09-18 HISTORY — PX: APPLICATION OF WOUND VAC: SHX5189

## 2016-09-18 LAB — CBC
HEMATOCRIT: 29 % — AB (ref 36.0–46.0)
HEMOGLOBIN: 9.4 g/dL — AB (ref 12.0–15.0)
MCH: 28.7 pg (ref 26.0–34.0)
MCHC: 32.4 g/dL (ref 30.0–36.0)
MCV: 88.4 fL (ref 78.0–100.0)
Platelets: 241 10*3/uL (ref 150–400)
RBC: 3.28 MIL/uL — ABNORMAL LOW (ref 3.87–5.11)
RDW: 14.8 % (ref 11.5–15.5)
WBC: 18.2 10*3/uL — AB (ref 4.0–10.5)

## 2016-09-18 LAB — CREATININE, SERUM
Creatinine, Ser: 0.87 mg/dL (ref 0.44–1.00)
GFR, EST NON AFRICAN AMERICAN: 59 mL/min — AB (ref >60)

## 2016-09-18 SURGERY — REMOVAL, TOTAL ARTHROPLASTY HARDWARE, KNEE, WITH ANTIBIOTIC SPACER INSERTION
Anesthesia: General | Site: Knee | Laterality: Right

## 2016-09-18 MED ORDER — ONDANSETRON HCL 4 MG PO TABS: 4.0000 mg | ORAL_TABLET | Freq: Four times a day (QID) | ORAL | Status: DC | PRN

## 2016-09-18 MED ORDER — FENTANYL CITRATE (PF) 100 MCG/2ML IJ SOLN
INTRAMUSCULAR | Status: AC
  Filled 2016-09-18: qty 4

## 2016-09-18 MED ORDER — CHLORHEXIDINE GLUCONATE 4 % EX LIQD: 60.0000 mL | Freq: Once | CUTANEOUS | Status: DC

## 2016-09-18 MED ORDER — FENTANYL CITRATE (PF) 100 MCG/2ML IJ SOLN
INTRAMUSCULAR | Status: AC
  Filled 2016-09-18: qty 2

## 2016-09-18 MED ORDER — SENNA 8.6 MG PO TABS
1.0000 | ORAL_TABLET | Freq: Two times a day (BID) | ORAL | Status: DC
  Administered 2016-09-18 – 2016-09-21 (×6): 8.6 mg via ORAL
  Filled 2016-09-18 (×8): qty 1

## 2016-09-18 MED ORDER — GLYCOPYRROLATE 0.2 MG/ML IJ SOLN
INTRAMUSCULAR | Status: DC | PRN
  Administered 2016-09-18: 0.4 mg via INTRAVENOUS

## 2016-09-18 MED ORDER — PROPOFOL 10 MG/ML IV BOLUS
INTRAVENOUS | Status: DC | PRN
  Administered 2016-09-18: 100 mg via INTRAVENOUS
  Administered 2016-09-18: 50 mg via INTRAVENOUS

## 2016-09-18 MED ORDER — DEXAMETHASONE SODIUM PHOSPHATE 10 MG/ML IJ SOLN
INTRAMUSCULAR | Status: AC
  Filled 2016-09-18: qty 1

## 2016-09-18 MED ORDER — HYDROCODONE-ACETAMINOPHEN 5-325 MG PO TABS
1.0000 | ORAL_TABLET | ORAL | Status: DC | PRN
  Administered 2016-09-19: 1 via ORAL
  Administered 2016-09-19 (×2): 2 via ORAL
  Administered 2016-09-19: 1 via ORAL
  Administered 2016-09-20 – 2016-09-21 (×4): 2 via ORAL
  Administered 2016-09-21: 1 via ORAL
  Administered 2016-09-21 – 2016-09-22 (×3): 2 via ORAL
  Filled 2016-09-18: qty 1
  Filled 2016-09-18 (×5): qty 2
  Filled 2016-09-18: qty 1
  Filled 2016-09-18 (×6): qty 2
  Filled 2016-09-18: qty 1

## 2016-09-18 MED ORDER — HYDROMORPHONE HCL 2 MG/ML IJ SOLN
INTRAMUSCULAR | Status: AC
  Filled 2016-09-18: qty 1

## 2016-09-18 MED ORDER — SODIUM CHLORIDE 0.9 % IV SOLN: INTRAVENOUS | Status: DC

## 2016-09-18 MED ORDER — METOCLOPRAMIDE HCL 5 MG/ML IJ SOLN: 5.0000 mg | Freq: Three times a day (TID) | INTRAMUSCULAR | Status: DC | PRN

## 2016-09-18 MED ORDER — FENTANYL CITRATE (PF) 100 MCG/2ML IJ SOLN
INTRAMUSCULAR | Status: DC | PRN
  Administered 2016-09-18: 100 ug via INTRAVENOUS
  Administered 2016-09-18 (×2): 50 ug via INTRAVENOUS
  Administered 2016-09-18: 100 ug via INTRAVENOUS
  Administered 2016-09-18 (×4): 50 ug via INTRAVENOUS
  Administered 2016-09-18: 100 ug via INTRAVENOUS

## 2016-09-18 MED ORDER — PROMETHAZINE HCL 25 MG/ML IJ SOLN: 6.2500 mg | INTRAMUSCULAR | Status: DC | PRN

## 2016-09-18 MED ORDER — DEXTROSE 5 % IV SOLN: 500.0000 mg | Freq: Four times a day (QID) | INTRAVENOUS | Status: DC | PRN

## 2016-09-18 MED ORDER — VITAMIN D (ERGOCALCIFEROL) 1.25 MG (50000 UNIT) PO CAPS
50000.0000 [IU] | ORAL_CAPSULE | ORAL | Status: DC
  Administered 2016-09-21: 50000 [IU] via ORAL
  Filled 2016-09-18: qty 1

## 2016-09-18 MED ORDER — PHENYLEPHRINE HCL 10 MG/ML IJ SOLN
INTRAVENOUS | Status: DC | PRN
  Administered 2016-09-18: 24 ug/min via INTRAVENOUS

## 2016-09-18 MED ORDER — PROPOFOL 10 MG/ML IV BOLUS
INTRAVENOUS | Status: AC
  Filled 2016-09-18: qty 20

## 2016-09-18 MED ORDER — SUGAMMADEX SODIUM 200 MG/2ML IV SOLN
INTRAVENOUS | Status: AC
  Filled 2016-09-18: qty 2

## 2016-09-18 MED ORDER — SODIUM CHLORIDE 0.9 % IR SOLN
Status: DC | PRN
  Administered 2016-09-18: 3000 mL
  Administered 2016-09-18: 1000 mL
  Administered 2016-09-18: 3000 mL
  Administered 2016-09-18: 250 mL

## 2016-09-18 MED ORDER — HYDROMORPHONE HCL 1 MG/ML IJ SOLN
INTRAMUSCULAR | Status: AC
  Filled 2016-09-18: qty 1

## 2016-09-18 MED ORDER — SODIUM CHLORIDE 0.9 % IV SOLN
INTRAVENOUS | Status: DC
  Administered 2016-09-19: 100 mL/h via INTRAVENOUS
  Administered 2016-09-19 – 2016-09-21 (×3): via INTRAVENOUS

## 2016-09-18 MED ORDER — ENOXAPARIN SODIUM 40 MG/0.4ML ~~LOC~~ SOLN
40.0000 mg | SUBCUTANEOUS | Status: DC
  Administered 2016-09-20 – 2016-09-22 (×3): 40 mg via SUBCUTANEOUS
  Filled 2016-09-18 (×3): qty 0.4

## 2016-09-18 MED ORDER — MIDAZOLAM HCL 5 MG/5ML IJ SOLN
INTRAMUSCULAR | Status: DC | PRN
  Administered 2016-09-18: 1 mg via INTRAVENOUS

## 2016-09-18 MED ORDER — BUPIVACAINE HCL (PF) 0.5 % IJ SOLN
INTRAMUSCULAR | Status: AC
  Filled 2016-09-18: qty 30

## 2016-09-18 MED ORDER — POLYETHYLENE GLYCOL 3350 17 G PO PACK: 17.0000 g | PACK | Freq: Every day | ORAL | Status: DC | PRN

## 2016-09-18 MED ORDER — PHENYLEPHRINE 40 MCG/ML (10ML) SYRINGE FOR IV PUSH (FOR BLOOD PRESSURE SUPPORT)
PREFILLED_SYRINGE | INTRAVENOUS | Status: AC
  Filled 2016-09-18: qty 10

## 2016-09-18 MED ORDER — SUGAMMADEX SODIUM 200 MG/2ML IV SOLN
INTRAVENOUS | Status: DC | PRN
  Administered 2016-09-18: 170 mg via INTRAVENOUS

## 2016-09-18 MED ORDER — DOCUSATE SODIUM 100 MG PO CAPS
100.0000 mg | ORAL_CAPSULE | Freq: Two times a day (BID) | ORAL | Status: DC
  Administered 2016-09-18 – 2016-09-21 (×6): 100 mg via ORAL
  Filled 2016-09-18 (×8): qty 1

## 2016-09-18 MED ORDER — LACTATED RINGERS IV SOLN
INTRAVENOUS | Status: DC
  Administered 2016-09-18: 17:00:00 via INTRAVENOUS
  Administered 2016-09-18: 50 mL/h via INTRAVENOUS
  Administered 2016-09-18: 15:00:00 via INTRAVENOUS

## 2016-09-18 MED ORDER — BISOPROLOL-HYDROCHLOROTHIAZIDE 2.5-6.25 MG PO TABS
1.0000 | ORAL_TABLET | Freq: Every day | ORAL | Status: DC
Start: 2016-09-19 — End: 2016-09-22
  Administered 2016-09-19 – 2016-09-22 (×4): 1 via ORAL
  Filled 2016-09-18 (×4): qty 1

## 2016-09-18 MED ORDER — METOCLOPRAMIDE HCL 5 MG PO TABS: 5.0000 mg | ORAL_TABLET | Freq: Three times a day (TID) | ORAL | Status: DC | PRN

## 2016-09-18 MED ORDER — ONDANSETRON HCL 4 MG/2ML IJ SOLN
INTRAMUSCULAR | Status: DC | PRN
  Administered 2016-09-18: 4 mg via INTRAVENOUS

## 2016-09-18 MED ORDER — HYDROMORPHONE HCL 1 MG/ML IJ SOLN
0.2500 mg | INTRAMUSCULAR | Status: DC | PRN
  Administered 2016-09-18: 0.25 mg via INTRAVENOUS
  Administered 2016-09-18 (×3): 0.5 mg via INTRAVENOUS
  Administered 2016-09-18: 0.25 mg via INTRAVENOUS

## 2016-09-18 MED ORDER — VANCOMYCIN HCL 1000 MG IV SOLR
INTRAVENOUS | Status: DC | PRN
  Administered 2016-09-18 (×10): 1000 mg

## 2016-09-18 MED ORDER — ACETAMINOPHEN 650 MG RE SUPP: 650.0000 mg | Freq: Four times a day (QID) | RECTAL | Status: DC | PRN

## 2016-09-18 MED ORDER — METHOCARBAMOL 500 MG PO TABS
500.0000 mg | ORAL_TABLET | Freq: Four times a day (QID) | ORAL | Status: DC | PRN
  Administered 2016-09-19 – 2016-09-21 (×2): 500 mg via ORAL
  Filled 2016-09-18 (×2): qty 1

## 2016-09-18 MED ORDER — ROCURONIUM BROMIDE 100 MG/10ML IV SOLN
INTRAVENOUS | Status: DC | PRN
  Administered 2016-09-18: 30 mg via INTRAVENOUS
  Administered 2016-09-18: 40 mg via INTRAVENOUS

## 2016-09-18 MED ORDER — VANCOMYCIN HCL 1000 MG IV SOLR
INTRAVENOUS | Status: AC
  Filled 2016-09-18: qty 4000

## 2016-09-18 MED ORDER — KETOROLAC TROMETHAMINE 30 MG/ML IJ SOLN
INTRAMUSCULAR | Status: AC
  Filled 2016-09-18: qty 1

## 2016-09-18 MED ORDER — DIPHENHYDRAMINE HCL 50 MG/ML IJ SOLN
INTRAMUSCULAR | Status: AC
  Filled 2016-09-18: qty 1

## 2016-09-18 MED ORDER — VANCOMYCIN HCL 1000 MG IV SOLR
INTRAVENOUS | Status: AC
  Filled 2016-09-18: qty 3000

## 2016-09-18 MED ORDER — POVIDONE-IODINE 10 % EX SWAB: 2.0000 "application " | Freq: Once | CUTANEOUS | Status: DC

## 2016-09-18 MED ORDER — ONDANSETRON HCL 4 MG/2ML IJ SOLN
INTRAMUSCULAR | Status: AC
  Filled 2016-09-18: qty 2

## 2016-09-18 MED ORDER — ACETAMINOPHEN 325 MG PO TABS: 650.0000 mg | ORAL_TABLET | Freq: Four times a day (QID) | ORAL | Status: DC | PRN

## 2016-09-18 MED ORDER — VANCOMYCIN HCL 1000 MG IV SOLR
INTRAVENOUS | Status: AC
Start: 2016-09-18 — End: 2016-09-18
  Filled 2016-09-18: qty 5000

## 2016-09-18 MED ORDER — VANCOMYCIN HCL IN DEXTROSE 750-5 MG/150ML-% IV SOLN
750.0000 mg | Freq: Two times a day (BID) | INTRAVENOUS | Status: DC
  Administered 2016-09-18: 750 mg via INTRAVENOUS
  Filled 2016-09-18 (×3): qty 150

## 2016-09-18 MED ORDER — POLYVINYL ALCOHOL 1.4 % OP SOLN
1.0000 [drp] | OPHTHALMIC | Status: DC | PRN
  Filled 2016-09-18: qty 15

## 2016-09-18 MED ORDER — ONDANSETRON HCL 4 MG/2ML IJ SOLN
4.0000 mg | Freq: Four times a day (QID) | INTRAMUSCULAR | Status: DC | PRN
  Administered 2016-09-19 (×2): 4 mg via INTRAVENOUS
  Filled 2016-09-18 (×2): qty 2

## 2016-09-18 MED ORDER — LIDOCAINE HCL (CARDIAC) 20 MG/ML IV SOLN
INTRAVENOUS | Status: DC | PRN
  Administered 2016-09-18: 50 mg via INTRAVENOUS

## 2016-09-18 MED ORDER — 0.9 % SODIUM CHLORIDE (POUR BTL) OPTIME
TOPICAL | Status: DC | PRN
  Administered 2016-09-18: 1000 mL

## 2016-09-18 MED ORDER — HYDROMORPHONE HCL 2 MG/ML IJ SOLN
0.5000 mg | INTRAMUSCULAR | Status: DC | PRN
  Administered 2016-09-19: 0.5 mg via INTRAVENOUS
  Filled 2016-09-18: qty 1

## 2016-09-18 MED ORDER — EPHEDRINE SULFATE 50 MG/ML IJ SOLN
INTRAMUSCULAR | Status: DC | PRN
  Administered 2016-09-18: 10 mg via INTRAVENOUS
  Administered 2016-09-18: 5 mg via INTRAVENOUS

## 2016-09-18 MED ORDER — MIDAZOLAM HCL 2 MG/2ML IJ SOLN
INTRAMUSCULAR | Status: AC
  Filled 2016-09-18: qty 2

## 2016-09-18 SURGICAL SUPPLY — 74 items
ADH SKN CLS APL DERMABOND .7 (GAUZE/BANDAGES/DRESSINGS) ×1
ALCOHOL ISOPROPYL (RUBBING) (MISCELLANEOUS) ×3 IMPLANT
BANDAGE ACE 6X5 VEL STRL LF (GAUZE/BANDAGES/DRESSINGS) ×3 IMPLANT
BANDAGE ELASTIC 6 VELCRO ST LF (GAUZE/BANDAGES/DRESSINGS) ×3 IMPLANT
BLADE SAW RECIP 87.9 MT (BLADE) IMPLANT
BLADE SAW SAG 73X25 THK (BLADE)
BLADE SAW SAG 9X24 (BLADE) ×3 IMPLANT
BLADE SAW SGTL 73X25 THK (BLADE) IMPLANT
BLADE SAW SGTL NAR THIN XSHT (BLADE) ×3 IMPLANT
BNDG CMPR MED 15X6 ELC VLCR LF (GAUZE/BANDAGES/DRESSINGS) ×1
BNDG ELASTIC 6X15 VLCR STRL LF (GAUZE/BANDAGES/DRESSINGS) ×3 IMPLANT
BONE CEMENT PALACOSE (Orthopedic Implant) ×15 IMPLANT
BUR ROUND FLUTED 5 RND (BURR) ×2 IMPLANT
BUR ROUND FLUTED 5MM RND (BURR) ×1
CANISTER WOUND CARE 500ML ATS (WOUND CARE) ×3 IMPLANT
CEMENT BONE PALACOSE (Orthopedic Implant) ×5 IMPLANT
CONT SPECI 4OZ STER CLIK (MISCELLANEOUS) ×6 IMPLANT
COVER SURGICAL LIGHT HANDLE (MISCELLANEOUS) ×3 IMPLANT
CUFF TOURNIQUET SINGLE 34IN LL (TOURNIQUET CUFF) ×3 IMPLANT
DERMABOND ADVANCED (GAUZE/BANDAGES/DRESSINGS) ×2
DERMABOND ADVANCED .7 DNX12 (GAUZE/BANDAGES/DRESSINGS) ×1 IMPLANT
DRAIN HEMOVAC 7FR (DRAIN) IMPLANT
DRAPE EXTREMITY T 121X128X90 (DRAPE) ×3 IMPLANT
DRAPE HALF SHEET 40X57 (DRAPES) ×3 IMPLANT
DRAPE SHEET LG 3/4 BI-LAMINATE (DRAPES) ×6 IMPLANT
DRAPE U-SHAPE 47X51 STRL (DRAPES) ×3 IMPLANT
DRSG ADAPTIC 3X8 NADH LF (GAUZE/BANDAGES/DRESSINGS) ×3 IMPLANT
DRSG AQUACEL AG ADV 3.5X14 (GAUZE/BANDAGES/DRESSINGS) ×3 IMPLANT
DRSG MEPILEX BORDER 4X4 (GAUZE/BANDAGES/DRESSINGS) ×3 IMPLANT
DRSG TEGADERM 2-3/8X2-3/4 SM (GAUZE/BANDAGES/DRESSINGS) ×3 IMPLANT
DRSG VAC ATS MED SENSATRAC (GAUZE/BANDAGES/DRESSINGS) ×3 IMPLANT
ELECT CAUTERY BLADE 6.4 (BLADE) ×3 IMPLANT
ELECT REM PT RETURN 9FT ADLT (ELECTROSURGICAL) ×3
ELECTRODE REM PT RTRN 9FT ADLT (ELECTROSURGICAL) ×1 IMPLANT
EVACUATOR 1/8 PVC DRAIN (DRAIN) ×3 IMPLANT
GAUZE SPONGE 4X4 12PLY STRL (GAUZE/BANDAGES/DRESSINGS) ×3 IMPLANT
GLOVE BIO SURGEON STRL SZ8.5 (GLOVE) ×15 IMPLANT
GLOVE BIOGEL PI IND STRL 8.5 (GLOVE) ×1 IMPLANT
GLOVE BIOGEL PI INDICATOR 8.5 (GLOVE) ×2
GOWN STRL REUS W/TWL 2XL LVL3 (GOWN DISPOSABLE) ×3 IMPLANT
HANDPIECE INTERPULSE COAX TIP (DISPOSABLE) ×3
IMMOBILIZER KNEE 22 UNIV (SOFTGOODS) ×3 IMPLANT
KIT BASIN OR (CUSTOM PROCEDURE TRAY) ×3 IMPLANT
MANIFOLD NEPTUNE II (INSTRUMENTS) ×3 IMPLANT
NEEDLE SPNL 18GX3.5 QUINCKE PK (NEEDLE) ×6 IMPLANT
PACK TOTAL JOINT (CUSTOM PROCEDURE TRAY) ×3 IMPLANT
PACK TOTAL KNEE CUSTOM (KITS) ×3 IMPLANT
PADDING CAST COTTON 6X4 STRL (CAST SUPPLIES) ×3 IMPLANT
SAW OSC TIP CART 19.5X105X1.3 (SAW) IMPLANT
SCRUB POVIDONE IODINE 4 OZ (MISCELLANEOUS) ×3 IMPLANT
SEALER BIPOLAR AQUA 6.0 (INSTRUMENTS) ×3 IMPLANT
SET HNDPC FAN SPRY TIP SCT (DISPOSABLE) ×1 IMPLANT
SOL PREP POV-IOD 4OZ 10% (MISCELLANEOUS) ×3 IMPLANT
SPONGE LAP 18X18 X RAY DECT (DISPOSABLE) ×6 IMPLANT
SUT ETHILON 2 0 PSLX (SUTURE) ×21 IMPLANT
SUT MNCRL AB 3-0 PS2 27 (SUTURE) ×3 IMPLANT
SUT MON AB 2-0 CT1 36 (SUTURE) ×6 IMPLANT
SUT PDS AB 1 CT  36 (SUTURE) ×4
SUT PDS AB 1 CT 36 (SUTURE) ×2 IMPLANT
SUT VIC AB 1 CTX 27 (SUTURE) ×3 IMPLANT
SUT VIC AB 2-0 CT1 27 (SUTURE) ×3
SUT VIC AB 2-0 CT1 TAPERPNT 27 (SUTURE) ×1 IMPLANT
SUT VLOC 180 0 24IN GS25 (SUTURE) ×3 IMPLANT
SWAB COLLECTION DEVICE MRSA (MISCELLANEOUS) ×3 IMPLANT
SWAB CULTURE ESWAB REG 1ML (MISCELLANEOUS) ×3 IMPLANT
SYR 50ML LL SCALE MARK (SYRINGE) ×6 IMPLANT
TIP HIGH FLOW IRRIGATION COAX (MISCELLANEOUS) ×3 IMPLANT
TOWEL OR 17X24 6PK STRL BLUE (TOWEL DISPOSABLE) ×9 IMPLANT
TOWER CARTRIDGE SMART MIX (DISPOSABLE) ×6 IMPLANT
TRAY CATH 16FR W/PLASTIC CATH (SET/KITS/TRAYS/PACK) ×3 IMPLANT
TUBE CONNECTING 12'X1/4 (SUCTIONS) ×1
TUBE CONNECTING 12X1/4 (SUCTIONS) ×2 IMPLANT
WRAP KNEE MAXI GEL POST OP (GAUZE/BANDAGES/DRESSINGS) ×3 IMPLANT
YANKAUER SUCT BULB TIP NO VENT (SUCTIONS) ×3 IMPLANT

## 2016-09-18 NOTE — Brief Op Note (Signed)
09/18/2016  7:42 PM  PATIENT:  Dawn Lawson  82 y.o. female  PRE-OPERATIVE DIAGNOSIS:  INFECTED RIGHT TOTAL KNEE INFECTED RIGHT TOTAL KNEE ARTHROPLASTY  POST-OPERATIVE DIAGNOSIS:  INFECTED RIGHT TOTAL KNEE INFECTED RIGHT TOTAL KNEE ARTHROPLASTY  PROCEDURE:  Procedure(s): RESECTION OF RIGHT TOTAL KNEE ARTHROPLASTY PLACEMENT OF ANTIBIOTIC SPACER (Right) APPLICATION OF WOUND VAC (Right)  SURGEON:  Surgeon(s) and Role:    * Rod Can, MD - Primary  PHYSICIAN ASSISTANT: none  ASSISTANTS: justin queen, rnfa   ANESTHESIA:   general  EBL:  Total I/O In: 500 [I.V.:500] Out: 100 [Blood:100]  BLOOD ADMINISTERED:none  DRAINS: medium HV x1, incisional wound VAC   LOCAL MEDICATIONS USED:  NONE  SPECIMEN:  Source of Specimen:  right knee joint fluid, interface membrane for culture  DISPOSITION OF SPECIMEN:  micro  COUNTS:  YES  TOURNIQUET:   Total Tourniquet Time Documented: Thigh (Right) - 117 minutes Total: Thigh (Right) - 117 minutes   DICTATION: .Other Dictation: Dictation Number [id number]  PLAN OF CARE: Admit to inpatient   PATIENT DISPOSITION:  PACU - hemodynamically stable.   Delay start of Pharmacological VTE agent (>24hrs) due to surgical blood loss or risk of bleeding: no

## 2016-09-18 NOTE — Progress Notes (Addendum)
Pharmacy Antibiotic Note  Dawn Lawson is a 82 y.o. female admitted on 09/18/2016 for right revision total knee arthroplasty.  Pharmacy has been consulted for vancomycin dosing. Patient received vancomycin 1523m perioperatively.  Per H&P, she has a history of previous sepsis from group B strep. She was worked up in the office, synovial fluid analysis from the right knee revealed 30,000 white blood cells with 98% neutrophils. Cultures grew group B strep. Her ESR was elevated at 98, and C-reactive protein elevated at 87.6 mg/L.  Plan: Vancomycin 7510mIV every 12 hours.  Goal trough 15-20 mcg/mL.   Height: 5' (152.4 cm) Weight: 188 lb 8 oz (85.5 kg) IBW/kg (Calculated) : 45.5  Temp (24hrs), Avg:98.1 F (36.7 C), Min:97.5 F (36.4 C), Max:98.7 F (37.1 C)   Recent Labs Lab 09/15/16 1121  WBC 10.5  CREATININE 0.86    Estimated Creatinine Clearance: 46.4 mL/min (by C-G formula based on SCr of 0.86 mg/dL).    Allergies  Allergen Reactions  . Penicillins Hives and Itching     Has patient had a PCN reaction causing immediate rash, facial/tongue/throat swelling, SOB or lightheadedness with hypotension: NO Has patient had a PCN reaction causing severe rash involving mucus membranes or skin necrosis: {NO Has patient had a PCN reaction that required hospitalization {NO Has patient had a PCN reaction occurring within the last 10 years:  # # YES # #  If all of the above answers are "NO", then may proceed with Cephalosporin use.  . Ampicillin Hives and Itching   Thank you for allowing pharmacy to be a part of this patient's care.  ToJodean Limaudisill 09/18/2016 9:58 PM

## 2016-09-18 NOTE — Anesthesia Preprocedure Evaluation (Addendum)
Anesthesia Evaluation  Patient identified by MRN, date of birth, ID band Patient awake    Reviewed: Allergy & Precautions, NPO status , Patient's Chart, lab work & pertinent test results  History of Anesthesia Complications (+) PONV and history of anesthetic complications  Airway Mallampati: II   Neck ROM: Full    Dental  (+) Teeth Intact, Dental Advisory Given   Pulmonary neg pulmonary ROS,    breath sounds clear to auscultation       Cardiovascular hypertension,  Rhythm:Regular Rate:Normal     Neuro/Psych negative neurological ROS     GI/Hepatic negative GI ROS,   Endo/Other  negative endocrine ROS  Renal/GU      Musculoskeletal  (+) Arthritis ,   Abdominal   Peds  Hematology  (+) anemia ,   Anesthesia Other Findings   Reproductive/Obstetrics                             Anesthesia Physical Anesthesia Plan  ASA: II  Anesthesia Plan: General   Post-op Pain Management:    Induction: Intravenous  Airway Management Planned: Oral ETT  Additional Equipment:   Intra-op Plan:   Post-operative Plan: Extubation in OR  Informed Consent: I have reviewed the patients History and Physical, chart, labs and discussed the procedure including the risks, benefits and alternatives for the proposed anesthesia with the patient or authorized representative who has indicated his/her understanding and acceptance.   Dental advisory given  Plan Discussed with:   Anesthesia Plan Comments:         Anesthesia Quick Evaluation

## 2016-09-18 NOTE — H&P (Signed)
TOTAL KNEE REVISION ADMISSION H&P  Patient is being admitted for right revision total knee arthroplasty.  Subjective:  Chief Complaint:right knee pain.  HPI: Dawn Lawson, 82 y.o. female, has a history of pain and functional disability in the right knee(s) due to Infected previous arthroplasty.  The indications for the revision of the total knee arthroplasty are history of total knee infection. Onset of symptoms was gradual starting 2 years ago with rapidlly worsening course since that time.  Prior procedures on the right knee(s) include arthroplasty.  Patient currently rates pain in the right knee(s) at 10 out of 10 with activity. There is night pain, worsening of pain with activity and weight bearing, pain that interferes with activities of daily living, pain with passive range of motion, joint swelling and Draining sinus tract 2.  This condition presents safety issues increasing the risk of falls.   She has a history of previous sepsis from group B strep. She was worked up in the office. Her synovial fluid analysis from the right knee revealed 30,000 white blood cells with 98% neutrophils. Cultures grew group B strep. Her ESR was elevated at 98, and C-reactive protein elevated at 87.6 mg/L.  Patient Active Problem List   Diagnosis Date Noted  . Macular degeneration 10/14/2014  . Vitamin D deficiency 10/14/2014  . HTN (hypertension) 10/14/2014  . Anemia 10/14/2014  . Obesity (BMI 30-39.9) 10/14/2014  . Arthritis 10/14/2014  . Cellulitis of right leg 03/22/2014   Past Medical History:  Diagnosis Date  . AKI (acute kidney injury) ([place]) 03/22/2014  . Arthritis   . ATN (acute tubular necrosis) ([place]) 03/22/2014  . Cataract   . Heart murmur   . Hypertension   . Lactic acidosis 03/22/2014  . PONV (postoperative nausea and vomiting)   . Sepsis due to group B Streptococcus ([place]) 04/14/2014  . Severe sepsis with acute organ dysfunction ([place]) 03/22/2014  . Status post PICC central line  placement 04/14/2014  . Urinary tract infection 03/22/2014    Past Surgical History:  Procedure Laterality Date  . ABDOMINAL HYSTERECTOMY     partial  . APPENDECTOMY    . EYE SURGERY    . JOINT REPLACEMENT Right    KNEE  . TEE WITHOUT CARDIOVERSION N/A 04/09/2014   Procedure: TRANSESOPHAGEAL ECHOCARDIOGRAM (TEE);  Surgeon: Lelon Perla, MD;  Location: [facility] ENDOSCOPY;  Service: Cardiovascular;  Laterality: N/A;    Prescriptions Prior to Admission  Medication Sig Dispense Refill Last Dose  . acetaminophen (TYLENOL) 325 MG tablet Take 650 mg by mouth every 6 (six) hours as needed for mild pain or headache.     Marland Kitchen aspirin EC 81 MG tablet Take 81 mg by mouth daily.   Taking  . bisoprolol-hydrochlorothiazide (ZIAC) 2.5-6.25 MG per tablet Take 1 tablet by mouth daily.   Taking  . cephALEXin (KEFLEX) 500 MG capsule Take 1 capsule by mouth 2 (two) times daily.  0   . Multiple Vitamins-Minerals (MULTIVITAMIN PO) Take 1 tablet by mouth daily.   Taking  . Multiple Vitamins-Minerals (PRESERVISION AREDS PO) Take 1 capsule by mouth 2 (two) times daily.     . polyvinyl alcohol (LIQUIFILM TEARS) 1.4 % ophthalmic solution Place 1 drop into both eyes as needed for dry eyes.     . Vitamin D, Ergocalciferol, (DRISDOL) 50000 UNITS CAPS capsule Take 50,000 Units by mouth every Thursday.   Taking   Allergies  Allergen Reactions  . Penicillins Hives and Itching     Has patient had a PCN  reaction causing immediate rash, facial/tongue/throat swelling, SOB or lightheadedness with hypotension: NO Has patient had a PCN reaction causing severe rash involving mucus membranes or skin necrosis: {NO Has patient had a PCN reaction that required hospitalization {NO Has patient had a PCN reaction occurring within the last 10 years:  # # YES # #  If all of the above answers are "NO", then may proceed with Cephalosporin use.  . Ampicillin Hives and Itching    Social History  Substance Use Topics  . Smoking status: Never  Smoker  . Smokeless tobacco: Never Used  . Alcohol use No    Family History  Problem Relation Age of Onset  . Heart disease Mother   . Cancer Mother   . Heart disease Father       Review of Systems  Constitutional: Positive for fever and malaise/fatigue.  HENT: Negative.   Eyes: Negative.   Respiratory: Negative.   Cardiovascular: Negative.   Gastrointestinal: Negative.   Genitourinary: Negative.   Musculoskeletal: Positive for joint pain.  Skin: Positive for rash.  Neurological: Negative.   Endo/Heme/Allergies: Negative.   Psychiatric/Behavioral: Negative.      Objective:  Physical Exam  Vitals reviewed. Constitutional: She is oriented to person, place, and time. She appears well-developed and well-nourished.  HENT:  Head: Normocephalic and atraumatic.  Eyes: Conjunctivae and EOM are normal. Pupils are equal, round, and reactive to light.  Neck: Normal range of motion. Neck supple.  Cardiovascular: Normal rate and regular rhythm.   Respiratory: Effort normal. No respiratory distress.  GI: Soft. She exhibits no distension.  Genitourinary:  Genitourinary Comments: deferred  Musculoskeletal:       Right knee: She exhibits decreased range of motion, swelling and effusion.       Legs: She has 2 draining sinus tracts. She has significant erythema. The knee is stiff. She has a healed midline incision.  Neurological: She is alert and oriented to person, place, and time. She has normal reflexes.  Skin: Skin is warm and dry.  Psychiatric: She has a normal mood and affect. Her behavior is normal. Judgment and thought content normal.    Vital signs in last 24 hours:    Labs:  Estimated body mass index is 36.81 kg/m as calculated from the following:   Height as of 09/15/16: 5' (1.524 m).   Weight as of 09/15/16: 85.5 kg (188 lb 8 oz).  Imaging Review X-rays of the right knee revealed a cemented total knee replacement. She appears to have some ostial  lysis.  Assessment/Plan:  Periprosthetic joint infection, right total knee arthroplasty   The patient history, physical examination, clinical judgment of the provider and imaging studies are consistent with end stage degenerative joint disease of the right knee(s), previous total knee arthroplasty. Revision total knee arthroplasty is deemed medically necessary. The treatment options including medical management, injection therapy, arthroscopy and revision arthroplasty were discussed at length. The risks and benefits of revision total knee arthroplasty were presented and reviewed. The risks due to aseptic loosening, infection, stiffness, patella tracking problems, thromboembolic complications and other imponderables were discussed. The patient acknowledged the explanation, agreed to proceed with the plan and consent was signed. Patient is being admitted for inpatient treatment for surgery, pain control, PT, OT, prophylactic antibiotics, VTE prophylaxis, progressive ambulation and ADL's and discharge planning.The patient is planning to be discharged to skilled nursing facility

## 2016-09-18 NOTE — Transfer of Care (Signed)
Immediate Anesthesia Transfer of Care Note  Patient: TANAIRY HOLDSWORTH  Procedure(s) Performed: Procedure(s): RESECTION OF RIGHT TOTAL KNEE ARTHROPLASTY PLACEMENT OF ANTIBIOTIC SPACER (Right) APPLICATION OF WOUND VAC (Right)  Patient Location: PACU  Anesthesia Type:General  Level of Consciousness: awake and alert   Airway & Oxygen Therapy: Patient Spontanous Breathing and Patient connected to nasal cannula oxygen  Post-op Assessment: Report given to RN, Post -op Vital signs reviewed and stable and Patient moving all extremities  Post vital signs: Reviewed and stable  Last Vitals:  Vitals:   09/18/16 1312 09/18/16 2019  BP: (!) 163/41   Pulse: 78   Resp: 18   Temp: 37.1 C (P) 36.4 C    Last Pain:  Vitals:   09/18/16 1312  TempSrc: Oral      Patients Stated Pain Goal: 3 ([id number])  Complications: No apparent anesthesia complications

## 2016-09-18 NOTE — Anesthesia Procedure Notes (Signed)
Procedure Name: Intubation Date/Time: 09/18/2016 3:57 PM Performed by: Rebekah Chesterfield L Pre-anesthesia Checklist: Patient identified, Emergency Drugs available, Suction available and Patient being monitored Patient Re-evaluated:Patient Re-evaluated prior to inductionOxygen Delivery Method: Circle System Utilized Preoxygenation: Pre-oxygenation with 100% oxygen Intubation Type: IV induction Ventilation: Mask ventilation without difficulty Laryngoscope Size: Mac and 3 Grade View: Grade I Tube type: Oral Tube size: 7.5 mm Number of attempts: 1 Airway Equipment and Method: Stylet and Oral airway Placement Confirmation: ETT inserted through vocal cords under direct vision,  positive ETCO2 and breath sounds checked- equal and bilateral Secured at: 20 cm Tube secured with: Tape Dental Injury: Teeth and Oropharynx as per pre-operative assessment

## 2016-09-19 ENCOUNTER — Encounter (HOSPITAL_COMMUNITY): Payer: Self-pay | Admitting: Orthopedic Surgery

## 2016-09-19 LAB — CBC
HEMATOCRIT: 28.3 % — AB (ref 36.0–46.0)
Hemoglobin: 9.1 g/dL — ABNORMAL LOW (ref 12.0–15.0)
MCH: 28.6 pg (ref 26.0–34.0)
MCHC: 32.2 g/dL (ref 30.0–36.0)
MCV: 89 fL (ref 78.0–100.0)
Platelets: 215 10*3/uL (ref 150–400)
RBC: 3.18 MIL/uL — ABNORMAL LOW (ref 3.87–5.11)
RDW: 14.7 % (ref 11.5–15.5)
WBC: 11.9 10*3/uL — AB (ref 4.0–10.5)

## 2016-09-19 LAB — BASIC METABOLIC PANEL
Anion gap: 8 (ref 5–15)
BUN: 20 mg/dL (ref 6–20)
CHLORIDE: 102 mmol/L (ref 101–111)
CO2: 23 mmol/L (ref 22–32)
CREATININE: 0.83 mg/dL (ref 0.44–1.00)
Calcium: 8.1 mg/dL — ABNORMAL LOW (ref 8.9–10.3)
GFR calc Af Amer: >60 mL/min (ref >60)
GFR calc non Af Amer: >60 mL/min (ref >60)
GLUCOSE: 183 mg/dL — AB (ref 65–99)
POTASSIUM: 4.5 mmol/L (ref 3.5–5.1)
Sodium: 133 mmol/L — ABNORMAL LOW (ref 135–145)

## 2016-09-19 MED ORDER — CEFAZOLIN SODIUM-DEXTROSE 2-4 GM/100ML-% IV SOLN
2.0000 g | Freq: Three times a day (TID) | INTRAVENOUS | Status: DC
  Administered 2016-09-19 – 2016-09-22 (×9): 2 g via INTRAVENOUS
  Filled 2016-09-19 (×12): qty 100

## 2016-09-19 NOTE — Clinical Social Work Note (Signed)
Clinical Social Work Assessment  Patient Details  Name: Dawn Lawson MRN: [id number] Date of Birth: 07-12-1929  Date of referral:  09/19/16               Reason for consult:  Facility Placement                Permission sought to share information with:  Family Supports Permission granted to share information::  Yes, Verbal Permission Granted  Name::     Retail banker::     Relationship::  Son  Contact Information:  [phone]  Housing/Transportation Living arrangements for the past 2 months:   ([place]) Source of Information:  Patient Patient Interpreter Needed:  None Criminal Activity/Legal Involvement Pertinent to Current Situation/Hospitalization:  No - Comment as needed Significant Relationships:  Adult Children Lives with:  Self Do you feel safe going back to the place where you live?  Yes Need for family participation in patient care:  No (Coment)  Care giving concerns:  No family or friends at bedside at this time. Pt granted CSW verbal permission to contact Son, Kasandra Knudsen.  Social Worker assessment / plan:  CSW spoke with pt at bedside. Pt reports she lives alone. Pt has a son who helps with her care, he lives less than 5 miles from her home. Pt is agreeable to SNF placement at this time. Pt request [facility]. CSW will reach out to facility to determine bed availability.   Employment status:  Retired Forensic scientist:  Medicare PT Recommendations:  [place] / Referral to community resources:  New Site  Patient/Family's Response to care:  Pt verbalized understanding of CSW role and appreciation of support.  Patient/Family's Understanding of and Emotional Response to Diagnosis, Current Treatment, and Prognosis:  Pt realistic and understanding of physical limitations. Pt is agreeable to SNF placement at this time.  Emotional Assessment Appearance:  Appears stated age Attitude/Demeanor/Rapport:   (Patient is  appropriate.) Affect (typically observed):  Accepting, Appropriate, Pleasant Orientation:  Oriented to Self, Oriented to Place Alcohol / Substance use:  Not Applicable Psych involvement (Current and /or in the community):  No (Comment)  Discharge Needs  Concerns to be addressed:  No discharge needs identified Readmission within the last 30 days:  No Current discharge risk:  Dependent with Mobility Barriers to Discharge:  Continued Medical Work up   [employer], [place]

## 2016-09-19 NOTE — Progress Notes (Signed)
Pt. transported from PACU to 5N- 13; alerted and oriented x4; oriented to room and hospital policies; hemovac, foley and wound vac in place. Family with pt.

## 2016-09-19 NOTE — Progress Notes (Signed)
Pharmacy Antibiotic Note  Dawn Lawson is a 82 y.o. female admitted on 09/18/2016  For rightrevisiontotal knee arthroplasty.  Pharmacy has been consulted to switch from vancomycin to Ancef for osteomyelitis.  MD aware of remote PCN allergy and wishes to try Ancef as it is a better option for Group B strep infection.  Patient is s/p antibiotic spacer placement on 09/18/16.  She is afebrile and her WBC is improving.  Renal function remains stable.   Plan: - Ancef 2gm IV Q8H - Monitor renal fxn, micro data, clinical progress - Monitor for s/sx of allergic reaction   Height: 5' (152.4 cm) Weight: 188 lb 8 oz (85.5 kg) IBW/kg (Calculated) : 45.5  Temp (24hrs), Avg:98.1 F (36.7 C), Min:97.5 F (36.4 C), Max:98.7 F (37.1 C)   Recent Labs Lab 09/15/16 1121 09/18/16 2154 09/19/16 0451  WBC 10.5 18.2* 11.9*  CREATININE 0.86 0.87 0.83    Estimated Creatinine Clearance: 48.1 mL/min (by C-G formula based on SCr of 0.83 mg/dL).    Allergies  Allergen Reactions  . Penicillins Hives and Itching     Has patient had a PCN reaction causing immediate rash, facial/tongue/throat swelling, SOB or lightheadedness with hypotension: NO Has patient had a PCN reaction causing severe rash involving mucus membranes or skin necrosis: {NO Has patient had a PCN reaction that required hospitalization {NO Has patient had a PCN reaction occurring within the last 10 years:  # # YES # #  If all of the above answers are "NO", then may proceed with Cephalosporin use.  . Ampicillin Hives and Itching    Antimicrobials this admission:  Vanc 11/27 >> 11/28 Ancef 11/28 >>  Dose adjustments this admission:  N/A  Microbiology results:  11/27 right knee wound - NGTD   Deserea Bordley D. Mina Marble, PharmD, BCPS Pager:  [phone] 09/19/2016, 9:33 AM

## 2016-09-19 NOTE — NC FL2 (Signed)
[place] LEVEL OF CARE SCREENING TOOL     IDENTIFICATION  Patient Name: Dawn Lawson Birthdate: 1929-09-21 Sex: female Admission Date (Current Location): 09/18/2016  [facility] and [place] Number:      Facility and Address:         Provider Number:    Attending Physician Name and Address:  Rod Can, MD  Relative Name and Phone Number:       Current Level of Care:   Recommended Level of Care:   Prior Approval Number:    Date Approved/Denied: 09/19/16 PASRR Number: [id number]  Discharge Plan: SNF    Current Diagnoses: Patient Active Problem List   Diagnosis Date Noted  . Infection of total right knee replacement ([place]) 09/18/2016  . Macular degeneration 10/14/2014  . Vitamin D deficiency 10/14/2014  . HTN (hypertension) 10/14/2014  . Anemia 10/14/2014  . Obesity (BMI 30-39.9) 10/14/2014  . Arthritis 10/14/2014  . Cellulitis of right leg 03/22/2014    Orientation RESPIRATION BLADDER Height & Weight     Place, Self  Normal Continent Weight: 188 lb 8 oz (85.5 kg) Height:  5' (152.4 cm)  BEHAVIORAL SYMPTOMS/MOOD NEUROLOGICAL BOWEL NUTRITION STATUS      Continent  (thin liquids)  AMBULATORY STATUS COMMUNICATION OF NEEDS Skin   Extensive Assist Verbally Wound Vac, Surgical wounds (Closed incision,compression wrap, right knee. Negative pressure wound therapy right knee, 125 mmHG)                       Personal Care Assistance Level of Assistance  Bathing, Feeding, Dressing Bathing Assistance: Maximum assistance Feeding assistance: Independent Dressing Assistance: Maximum assistance     Functional Limitations Info  Sight, Hearing, Speech Sight Info: Adequate Hearing Info: Adequate Speech Info: Adequate    SPECIAL CARE FACTORS FREQUENCY  PT (By licensed PT), OT (By licensed OT)     PT Frequency: 5x week OT Frequency: 5x week            Contractures Contractures Info: Not present    Additional Factors Info  Code  Status, Allergies Code Status Info: Full Allergies Info: Penicillins, Ampicillin           Current Medications (09/19/2016):  This is the current hospital active medication list Current Facility-Administered Medications  Medication Dose Route Frequency Provider Last Rate Last Dose  . 0.9 %  sodium chloride infusion   Intravenous Continuous Rod Can, MD 100 mL/hr at 09/19/16 1100    . acetaminophen (TYLENOL) tablet 650 mg  650 mg Oral Q6H PRN Rod Can, MD       Or  . acetaminophen (TYLENOL) suppository 650 mg  650 mg Rectal Q6H PRN Rod Can, MD      . bisoprolol-hydrochlorothiazide [facility]) 2.5-6.25 MG per tablet 1 tablet  1 tablet Oral Daily Rod Can, MD   1 tablet at 09/19/16 1046  . ceFAZolin (ANCEF) IVPB 2g/100 mL premix  2 g Intravenous Q8H Tyrone Apple, RPH   2 g at 09/19/16 1312  . docusate sodium (COLACE) capsule 100 mg  100 mg Oral BID Rod Can, MD   100 mg at 09/19/16 1046  . enoxaparin (LOVENOX) injection 40 mg  40 mg Subcutaneous Q24H Rod Can, MD      . HYDROcodone-acetaminophen (NORCO/VICODIN) 5-325 MG per tablet 1-2 tablet  1-2 tablet Oral Q4H PRN Rod Can, MD   1 tablet at 09/19/16 0157  . HYDROmorphone (DILAUDID) injection 0.5 mg  0.5 mg Intravenous Q2H PRN Rod Can, MD  0.5 mg at 09/19/16 0445  . methocarbamol (ROBAXIN) tablet 500 mg  500 mg Oral Q6H PRN Rod Can, MD   500 mg at 09/19/16 0157   Or  . methocarbamol (ROBAXIN) 500 mg in dextrose 5 % 50 mL IVPB  500 mg Intravenous Q6H PRN Rod Can, MD      . metoCLOPramide (REGLAN) tablet 5-10 mg  5-10 mg Oral Q8H PRN Rod Can, MD       Or  . metoCLOPramide (REGLAN) injection 5-10 mg  5-10 mg Intravenous Q8H PRN Rod Can, MD      . ondansetron [facility]) tablet 4 mg  4 mg Oral Q6H PRN Rod Can, MD       Or  . ondansetron (ZOFRAN) injection 4 mg  4 mg Intravenous Q6H PRN Rod Can, MD   4 mg at 09/19/16 1238  . polyethylene glycol (MIRALAX /  GLYCOLAX) packet 17 g  17 g Oral Daily PRN Rod Can, MD      . polyvinyl alcohol (LIQUIFILM TEARS) 1.4 % ophthalmic solution 1 drop  1 drop Both Eyes PRN Rod Can, MD      . senna (SENOKOT) tablet 8.6 mg  1 tablet Oral BID Rod Can, MD   8.6 mg at 09/19/16 1046  . [START ON 09/21/2016] Vitamin D (Ergocalciferol) (DRISDOL) capsule 50,000 Units  50,000 Units Oral Q Thu Rod Can, MD         Discharge Medications: Please see discharge summary for a list of discharge medications.  Relevant Imaging Results:  Relevant Lab Results:   Additional Information SSN: [id number]  Alla German, LCSW

## 2016-09-19 NOTE — Plan of Care (Signed)
Problem: Education: Goal: Knowledge of [facility] Education information/materials will improve Outcome: Progressing POC reviewed with pt.   

## 2016-09-19 NOTE — Consult Note (Signed)
[facility] for Infectious Disease  Total days of antibiotics 2        Day 1 vanco               Reason for Consult: pji   Referring Physician: swinteck  Principal Problem:   Infection of total right knee replacement (HCC)    HPI: Dawn Lawson is a 82 y.o. female with hx of arthritis s/p R TKA. She has struggled for greater than 2 years with right knee pain and swelling that followed after having hospitalization in June 2015 for group B strep bacteremia with associated cellulitis. She had TEE that ruled out endocarditis,but it does not appear that there was evaluation of her right knee joint. Over the past 2 years, her right knee pain has progressed but also has had chronic signs of celllulitis and draining sinus tract. She was seen by Dr Lyla Glassing as an outpatient who diagnosed her with chronic group b strep pji. Inflammatory markers were elevated at 98 and CRP 87. Arthrocentesis showed WBC 30K with 98% neurtrophils and cx showing GBS. She was admitted on 11/27 for resection of original arthroplasty with abtx spacer placement on 11/27. Dr Delfino Lovett found diseased bone as well as sinus tracts into tibia suggesting long standing infection. She has received a dose of cefazolin without difficulty this afternoon  Past Medical History:  Diagnosis Date  . AKI (acute kidney injury) ([place]) 03/22/2014  . Arthritis   . ATN (acute tubular necrosis) ([place]) 03/22/2014  . Cataract   . Heart murmur   . Hypertension   . Lactic acidosis 03/22/2014  . PONV (postoperative nausea and vomiting)   . Sepsis due to group B Streptococcus ([place]) 04/14/2014  . Severe sepsis with acute organ dysfunction ([place]) 03/22/2014  . Status post PICC central line placement 04/14/2014  . Urinary tract infection 03/22/2014    Allergies:  Allergies  Allergen Reactions  . Penicillins Hives and Itching     Has patient had a PCN reaction causing immediate rash, facial/tongue/throat swelling, SOB or lightheadedness with  hypotension: NO Has patient had a PCN reaction causing severe rash involving mucus membranes or skin necrosis: {NO Has patient had a PCN reaction that required hospitalization {NO Has patient had a PCN reaction occurring within the last 10 years:  # # YES # #  If all of the above answers are "NO", then may proceed with Cephalosporin use.  . Ampicillin Hives and Itching    MEDICATIONS: . bisoprolol-hydrochlorothiazide  1 tablet Oral Daily  . docusate sodium  100 mg Oral BID  . enoxaparin (LOVENOX) injection  40 mg Subcutaneous Q24H  . senna  1 tablet Oral BID  . vancomycin  750 mg Intravenous Q12H  . [START ON 09/21/2016] Vitamin D (Ergocalciferol)  50,000 Units Oral Q Thu    Social History  Substance Use Topics  . Smoking status: Never Smoker  . Smokeless tobacco: Never Used  . Alcohol use No    Family History  Problem Relation Age of Onset  . Heart disease Mother   . Cancer Mother   . Heart disease Father      Review of Systems  Constitutional: Negative for fever, chills, diaphoresis, activity change, appetite change, fatigue and unexpected weight change.  HENT: Negative for congestion, sore throat, rhinorrhea, sneezing, trouble swallowing and sinus pressure.  Eyes: Negative for photophobia and visual disturbance.  Respiratory: Negative for cough, chest tightness, shortness of breath, wheezing and stridor.  Cardiovascular: Negative for chest pain, palpitations  and leg swelling.  Gastrointestinal: Negative for nausea, vomiting, abdominal pain, diarrhea, constipation, blood in stool, abdominal distention and anal bleeding.  Genitourinary: Negative for dysuria, hematuria, flank pain and difficulty urinating.  Musculoskeletal: right leg pain from surgery Skin: Negative for color change, pallor, rash and wound.  Neurological: Negative for dizziness, tremors, weakness and light-headedness.  Hematological: Negative for adenopathy. Does not bruise/bleed easily.    Psychiatric/Behavioral: Negative for behavioral problems, confusion, sleep disturbance, dysphoric mood, decreased concentration and agitation.     OBJECTIVE: Temp:  [97.5 F (36.4 C)-98.7 F (37.1 C)] 98.7 F (37.1 C) (11/28 0500) Pulse Rate:  [65-78] 66 (11/28 0500) Resp:  [10-18] 16 (11/28 0500) BP: (112-180)/(41-70) 122/60 (11/28 0500) SpO2:  [90 %-100 %] 99 % (11/28 0500) Weight:  [188 lb 8 oz (85.5 kg)] 188 lb 8 oz (85.5 kg) (11/27 1312) Physical Exam  Constitutional:  oriented to person, place, and time. appears well-developed and well-nourished. No distress.  HENT: /AT, PERRLA, no scleral icterus Mouth/Throat: Oropharynx is clear and moist. No oropharyngeal exudate.  Cardiovascular: Normal rate, regular rhythm and normal heart sounds. 2/6 SEM at apex & LUSB Pulmonary/Chest: Effort normal and breath sounds normal. No respiratory distress.  has no wheezes.  Neck = supple, no nuchal rigidity Abdominal: Soft. Bowel sounds are normal.  exhibits no distension. There is no tenderness.  Lymphadenopathy: no cervical adenopathy. No axillary adenopathy Neurological: alert and oriented to person, place, and time. Moves all extremities [id number] leg wrapped and immobilized from surgery Skin: Skin is warm and dry. No rash noted. No erythema.  Psychiatric: a normal mood and affect.  behavior is normal.    LABS: Results for orders placed or performed during the hospital encounter of 09/18/16 (from the past 48 hour(s))  Aerobic/Anaerobic Culture (surgical/deep wound)     Status: None (Preliminary result)   Collection Time: 09/18/16  4:45 PM  Result Value Ref Range   Specimen Description WOUND RIGHT KNEE    Special Requests NONE    Gram Stain      ABUNDANT WBC PRESENT,BOTH PMN AND MONONUCLEAR NO ORGANISMS SEEN    Culture PENDING    Report Status PENDING   CBC     Status: Abnormal   Collection Time: 09/18/16  9:54 PM  Result Value Ref Range   WBC 18.2 (H) 4.0 - 10.5 K/uL   RBC  3.28 (L) 3.87 - 5.11 MIL/uL   Hemoglobin 9.4 (L) 12.0 - 15.0 g/dL   HCT 29.0 (L) 36.0 - 46.0 %   MCV 88.4 78.0 - 100.0 fL   MCH 28.7 26.0 - 34.0 pg   MCHC 32.4 30.0 - 36.0 g/dL   RDW 14.8 11.5 - 15.5 %   Platelets 241 150 - 400 K/uL  Creatinine, serum     Status: Abnormal   Collection Time: 09/18/16  9:54 PM  Result Value Ref Range   Creatinine, Ser 0.87 0.44 - 1.00 mg/dL   GFR calc non Af Amer 59 (L) >60 mL/min   GFR calc Af Amer >60 >60 mL/min    Comment: (NOTE) The eGFR has been calculated using the CKD EPI equation. This calculation has not been validated in all clinical situations. eGFR's persistently <60 mL/min signify possible Chronic Kidney Disease.   Basic metabolic panel     Status: Abnormal   Collection Time: 09/19/16  4:51 AM  Result Value Ref Range   Sodium 133 (L) 135 - 145 mmol/L   Potassium 4.5 3.5 - 5.1 mmol/L   Chloride 102 101 -  111 mmol/L   CO2 23 22 - 32 mmol/L   Glucose, Bld 183 (H) 65 - 99 mg/dL   BUN 20 6 - 20 mg/dL   Creatinine, Ser 0.83 0.44 - 1.00 mg/dL   Calcium 8.1 (L) 8.9 - 10.3 mg/dL   GFR calc non Af Amer >60 >60 mL/min   GFR calc Af Amer >60 >60 mL/min    Comment: (NOTE) The eGFR has been calculated using the CKD EPI equation. This calculation has not been validated in all clinical situations. eGFR's persistently <60 mL/min signify possible Chronic Kidney Disease.    Anion gap 8 5 - 15  CBC     Status: Abnormal   Collection Time: 09/19/16  4:51 AM  Result Value Ref Range   WBC 11.9 (H) 4.0 - 10.5 K/uL   RBC 3.18 (L) 3.87 - 5.11 MIL/uL   Hemoglobin 9.1 (L) 12.0 - 15.0 g/dL   HCT 28.3 (L) 36.0 - 46.0 %   MCV 89.0 78.0 - 100.0 fL   MCH 28.6 26.0 - 34.0 pg   MCHC 32.2 30.0 - 36.0 g/dL   RDW 14.7 11.5 - 15.5 %   Platelets 215 150 - 400 K/uL    MICRO:  IMAGING: Dg Knee Right Port  Result Date: 09/18/2016 CLINICAL DATA:  Right postoperative image, static antibiotic spacer in place. EXAM: PORTABLE RIGHT KNEE - 1-2 VIEW COMPARISON:   10/18/2014 FINDINGS: There is a surgical drain present with tip over the lateral aspect of the knee joint. There is an intra medullary rod extending from the femur through the knee joint into the tibia. There is moderate radiodense material overlying the knee joint, distal femur proximal tibia compatible with history of static antibiotic spacer. Postsurgical change over the superficial soft tissues of the knee. IMPRESSION: Postoperative changes of the right knee as described with hardware intact and antibiotic spacer is well as surgical drain in place. Electronically Signed   By: Marin Olp M.D.   On: 09/18/2016 20:40    HISTORICAL MICRO/IMAGING  Assessment/Plan:  Group b strep pji POD#1 s/p athroplasty resection and abtx spacer placement  -remote penicillin allergy - will start with cefazolin to see if she tolerates as this would be better drug of choice for group b strep. Will continue to see how she tolerates further doses. Would prefer beta lactam rather than vancomycin to minimize risk for aki  - will need 6 wk of treatment - will place picc line order in for tomorrow - will check sed rate and crp  Leukocytosis = improving, continue to check cbc  Will arrange abtx orders through pharmacy OPAT order set  And have her follow up in clinic in 4-6 wk

## 2016-09-19 NOTE — Anesthesia Postprocedure Evaluation (Signed)
Anesthesia Post Note  Patient: Dawn Lawson  Procedure(s) Performed: Procedure(s) (LRB): RESECTION OF RIGHT TOTAL KNEE ARTHROPLASTY PLACEMENT OF ANTIBIOTIC SPACER (Right) APPLICATION OF WOUND VAC (Right)  Patient location during evaluation: PACU Anesthesia Type: General Level of consciousness: awake and alert Pain management: satisfactory to patient (still c some pain in knee) Vital Signs Assessment: post-procedure vital signs reviewed and stable Respiratory status: spontaneous breathing, nonlabored ventilation, respiratory function stable and patient connected to nasal cannula oxygen Cardiovascular status: blood pressure returned to baseline and stable Postop Assessment: no signs of nausea or vomiting Anesthetic complications: no    Last Vitals:  Vitals:   09/19/16 0500 09/19/16 0900  BP: 122/60 (!) 127/40  Pulse: 66 75  Resp: 16 16  Temp: 37.1 C 36.9 C    Last Pain:  Vitals:   09/19/16 0947  TempSrc:   PainSc: 8                  Naysa Puskas,JAMES TERRILL

## 2016-09-19 NOTE — Evaluation (Signed)
Physical Therapy Evaluation Patient Details Name: Dawn Lawson MRN: [id number] DOB: 02/17/1929 Today's Date: 09/19/2016   History of Present Illness  Dawn Lawson, 82 y.o.female,has a history of pain and functional disability in the rightknee(s)due toInfected previous arthroplasty. The indications for the revisionof thetotal knee arthroplasty arehistory of total knee infection. Pt s/p resection of R TKA placement of antibiotic spacer and application of wound vac.  Clinical Impression  Pt motivated and eager to participate in PT however requires maxAx2 for all mobility and is unable to maintain R LE TTWB. Recommend SNF upon d/c to address deficits. Acute PT to follow.    Follow Up Recommendations SNF;Supervision/Assistance - 24 hour    Equipment Recommendations  None recommended by PT    Recommendations for Other Services       Precautions / Restrictions Precautions Precautions: Fall Precaution Comments: wound vac,  Required Braces or Orthoses: Knee Immobilizer - Right Knee Immobilizer - Right: On at all times Restrictions Weight Bearing Restrictions: Yes RLE Weight Bearing: Touchdown weight bearing      Mobility  Bed Mobility Overal bed mobility: Needs Assistance;+2 for physical assistance Bed Mobility: Supine to Sit     Supine to sit: Max assist;+2 for physical assistance     General bed mobility comments: pt has an exteremely difficult time bending at hips to sit up, maxAx2 for trunk elevation and helicopter spin to EOB  Transfers Overall transfer level: Needs assistance Equipment used: Rolling walker (2 wheeled) Transfers: Sit to/from [facility] Sit to Stand: Max assist;+2 physical assistance Stand pivot transfers: Mod assist;+2 physical assistance       General transfer comment: pt required maxAx2 with gait belt and pulling up on PT and tech's UEs to get into standing, Pt unable to maintain R LE TTWB. once in walker pt minAx2 to  maintain standing. Pt mod/maxAx2 for stand pt as pt unable to [place] at hips to swing around to chair  Ambulation/Gait             General Gait Details: unable due to inability to maintain R LE TTWB  Stairs            Wheelchair Mobility    Modified Rankin (Stroke Patients Only)       Balance Overall balance assessment: Needs assistance Sitting-balance support: Feet supported;Bilateral upper extremity supported Sitting balance-Leahy Scale: Fair Sitting balance - Comments: limited by body habitus and R LE TTWB   Standing balance support: Bilateral upper extremity supported Standing balance-Leahy Scale: Poor Standing balance comment: requires external support and RW                             Pertinent Vitals/Pain Pain Assessment: 0-10 Pain Score: 8  Pain Location: R knee with mobility Pain Descriptors / Indicators: Sharp Pain Intervention(s): Monitored during session    Home Living Family/patient expects to be discharged to:: Skilled nursing facility Living Arrangements: Alone               Additional Comments: per family pt driving, using cane and doing everything on her own    Prior Function Level of Independence: Independent               Hand Dominance   Dominant Hand: Right    Extremity/Trunk Assessment   Upper Extremity Assessment: Overall WFL for tasks assessed           Lower Extremity Assessment: RLE deficits/detail  RLE Deficits / Details: pt unable to actively move knee due to KI, ankle WFL    Cervical / Trunk Assessment: Normal  Communication   Communication: No difficulties  Cognition Arousal/Alertness: Awake/alert Behavior During Therapy: WFL for tasks assessed/performed Overall Cognitive Status: Within Functional Limits for tasks assessed                      General Comments General comments (skin integrity, edema, etc.): wound vac intact    Exercises     Assessment/Plan     PT Assessment Patient needs continued PT services  PT Problem List Decreased strength;Decreased range of motion;Decreased activity tolerance;Decreased balance;Decreased mobility;Decreased safety awareness          PT Treatment Interventions DME instruction;Gait training;Stair training;Functional mobility training;Therapeutic activities;Therapeutic exercise;Balance training    PT Goals (Current goals can be found in the Care Plan section)  Acute Rehab PT Goals Patient Stated Goal: didn't state PT Goal Formulation: With patient Time For Goal Achievement: 09/26/16 Potential to Achieve Goals: Good    Frequency Min 5X/week   Barriers to discharge Decreased caregiver support lives alone    Co-evaluation               End of Session Equipment Utilized During Treatment: Gait belt;Right knee immobilizer Activity Tolerance: Patient tolerated treatment well Patient left: in chair;with call bell/phone within reach;with chair alarm set Nurse Communication: Mobility status;Need for lift equipment (use hoyer lift)         Time: [id number] PT Time Calculation (min) (ACUTE ONLY): 25 min   Charges:   PT Evaluation $PT Eval Moderate Complexity: 1 Procedure PT Treatments $Therapeutic Activity: 8-22 mins   PT G Codes:        Yeimi Debnam M [place] 09/19/2016, 12:04 PM   Kittie Plater, PT, DPT Pager #: [phone] Office #: [phone]

## 2016-09-19 NOTE — Progress Notes (Signed)
   Subjective:  Patient reports pain as moderate.  No c/o.  Objective:   VITALS:   Vitals:   09/18/16 2338 09/19/16 0022 09/19/16 0500 09/19/16 0900  BP: (!) 149/48 (!) 167/48 122/60 (!) 127/40  Pulse: 65 69 66 75  Resp: 18 16 16 16   Temp: 97.5 F (36.4 C) 97.8 F (36.6 C) 98.7 F (37.1 C) 98.4 F (36.9 C)  TempSrc: Oral Oral Oral Oral  SpO2: 99% 100% 99% 99%  Weight:      Height:        NAD ABD soft Sensation intact distally Intact pulses distally Dorsiflexion/Plantar flexion intact Incision: dressing C/D/I Compartment soft Knee immobilizer intact Wound VAC intact HV ss   Lab Results  Component Value Date   WBC 11.9 (H) 09/19/2016   HGB 9.1 (L) 09/19/2016   HCT 28.3 (L) 09/19/2016   MCV 89.0 09/19/2016   PLT 215 09/19/2016   BMET    Component Value Date/Time   NA 133 (L) 09/19/2016 0451   K 4.5 09/19/2016 0451   CL 102 09/19/2016 0451   CO2 23 09/19/2016 0451   GLUCOSE 183 (H) 09/19/2016 0451   BUN 20 09/19/2016 0451   CREATININE 0.83 09/19/2016 0451   CALCIUM 8.1 (L) 09/19/2016 0451   GFRNONAA >60 09/19/2016 0451   GFRAA >60 09/19/2016 0451    Recent Results (from the past 240 hour(s))  Surgical pcr screen     Status: None   Collection Time: 09/15/16 11:21 AM  Result Value Ref Range Status   MRSA, PCR NEGATIVE NEGATIVE Final   Staphylococcus aureus NEGATIVE NEGATIVE Final    Comment:        The Xpert SA Assay (FDA approved for NASAL specimens in patients over 54 years of age), is one component of a comprehensive surveillance program.  Test performance has been validated by [facility] for patients greater than or equal to 37 year old. It is not intended to diagnose infection nor to guide or monitor treatment.   Aerobic/Anaerobic Culture (surgical/deep wound)     Status: None (Preliminary result)   Collection Time: 09/18/16  4:45 PM  Result Value Ref Range Status   Specimen Description WOUND RIGHT KNEE  Final   Special Requests NONE   Final   Gram Stain   Final    ABUNDANT WBC PRESENT,BOTH PMN AND MONONUCLEAR NO ORGANISMS SEEN    Culture PENDING  Incomplete   Report Status PENDING  Incomplete      Assessment/Plan: 1 Day Post-Op   Principal Problem:   Infection of total right knee replacement (HCC)  R knee PJI s/p resection arthroplasty, GBS  TDWB RLE Knee immobilizer at all times Cont wound VAC ID consult for IV recs and duration Follow cultures PT/OT DVT ppx: lovenox, SCDs, TEDs D/C planning, will likely need PICC, monitor HV output, follow cultures   Michela Herst, Horald Pollen 09/19/2016, 12:44 PM   Rod Can, MD Cell [phone]

## 2016-09-19 NOTE — Op Note (Signed)
NAME:  Dawn Lawson, Dawn Lawson NO.:  [id number]  MEDICAL RECORD NO.:  [id number]  LOCATION:  [facility]IO                        FACILITY:  [facility]  PHYSICIAN:  Rod Can, MD     DATE OF BIRTH:  [birth year]  DATE OF PROCEDURE:  09/18/2016 DATE OF DISCHARGE:                              OPERATIVE REPORT   SURGEON:  Rod Can, MD.  ASSISTANT:  Ky Barban, RNFA.  PREOPERATIVE DIAGNOSIS:  Periprosthetic joint infection, right total knee replacement.  POSTOPERATIVE DIAGNOSIS:  Periprosthetic joint infection, right total knee replacement.  PROCEDURE PERFORMED:  Resection right total knee arthroplasty with placement of static antibiotic spacer   EXPLANTS: DePuy Sigma rotating platform total knee.  IMPLANTS:  Static antibiotic spacer made with Palacos cement and 2 g of vancomycin per pack.  ANESTHESIA:  General.  EBL:  200 mL.  SPECIMENS: 1. Right knee fluid swab for culture. 2. Right tibial interface membrane for tissue culture. 3. Right femoral interface membrane for tissue culture.  TUBES AND DRAINS: 1. Medium Hemovac x1. 2. Incisional wound VAC.  COMPLICATIONS:  None.  DISPOSITION:  Stable to PACU.  INDICATIONS:  The patient is an 82 year old female, who underwent remote right total knee replacement.  It sounds like she was doing well until about 2 years ago when she developed sepsis due to group B strep urinary tract infection.  Since that time, she has had right knee pain and swelling.  About 8 months ago, she developed 2 draining sinus tracts, which have been managed previously with a biopsy and oral antibiotics. I saw her in the office and made the diagnosis of chronically infected right total knee replacement.  Her inflammatory markers were grossly elevated.  Her right knee synovial fluid aspirate showed 30,000 white blood cells, 98% neutrophils, and grew group B strep.  Given the chronicity of the infection and the overall poor appearance of her  leg, I recommended above-the-knee amputation.  The patient refused this procedure and wanted to try limb salvage.  Therefore she was indicated for resection arthroplasty placement of static spacer.  Risks, benefits, and alternatives were explained.  She elected to proceed.  DESCRIPTION OF PROCEDURE IN DETAIL:  I identified the patient in the holding area.  I marked the surgical site.  She was taken to the operating room.  General anesthesia was induced.  Nonsterile tourniquet applied to the thigh.  Right lower extremity prepped and draped in normal sterile surgical fashion.  Time-out was called verifying side and site of surgery.  I used a gravity to exsanguinate the lower extremity. I elevated the tourniquet to 300 mmHg.  I began by examining her lower Extremity. In line with her incision at the inferior aspect, she had a draining sinus tract.  She had an another sinus tract medial to this, essentially over the medial femoral condyle.  I used a #10 blade.  I excised her previous incision and I ellipsed out her 2 sinus tracts.  I raised full-thickness skin flaps.  I made a standard medial parapatellar arthrotomy.  There was a lot of purulent fluid inside the knee.  Some of the fluid was swabbed and sent for culture.  I performed a radical  synovectomy of the suprapatellar pouch, medial gutter, and lateral gutter.  I performed a medial release.  I excised the infrapatellar scar.  I flexed up the knee.  The tibial component was grossly loose.  I used an osteotome to remove the rotating platform post.  I then used ACL saw blade to disrupt the interface between the implant and the cement of the femoral component.  I then easily removed the femoral component with essentially no bone loss.  The bone remaining clearly had osteomyelitis. The bone was soft and appeared nonviable.  I sharply debrided all nonviable bone with a rongeur.  When this was completed, essentially all the metaphyseal bone  had to be removed.  I then turned my attention to the tibia.  Tibial component was grossly loose.  I removed the tibial component.  I used osteotomes to remove the cement.  I used a drill bit to gain access to the intramedullary canal of the tibia.  I sequentially reamed up to a 14 mm reamer on both the tibia and the femur.  She had several sinus tracts within the tibia.  These were excisionally debrided with a rongeur.  She did have extensive metaphyseal bone loss after all necrotic bone was removed.  I then removed the patellar implant with an ACL saw blade.  I used a TPS bur to completely remove all cement material as well as the polyethylene pegs for the patella.  I then performed a radical synovectomy of the posterior compartment of the knee. I then copiously irrigated the knee with 9 L of saline.  Once I was satisfied that all necrotic tissue was removed, I then on the back table mixed Palacos cement with vancomycin 2 g per pack.  I injected the cement into a  36-French chest tube.  I inserted a Rush rod into the chest tube.   I let down the tourniquet and ensured meticulous hemostasis with  electrocautery. Once the glue was fully hardened, I then inserted the antibiotic coated rod up and down up the femur down the tibia.  I then mixed 3 additional packs of cement with 2 g of vanco per pack.  This was made into a spacer block. The cement was allowed to harden.    I closed the arthrotomy with #1 PDS and 0 V-Loc over medium Hemovac drain.   Deep dermal layer was closed with 2-0 interrupted Monocryl.  Skin was closed  with 2-0 nylon using vertical mattress technique.  I then applied an incisional  wound VAC. The wounds were closed without any undue tension.  The wound VAC had good seal.  A sterile dressing followed by an immobilizer was placed. The patient was extubated and taken to the PACU in stable condition. Sponge, needle, and instrument counts were correct at the end of the case  x2.  There were no known complications.  Postoperatively, we will admit the patient to the hospital.  She will be touchdown weightbearing. Knee immobilizer at all times.  Continue incisional wound VAC. Infectious Disease consult.  Await final cultures.          ______________________________ Rod Can, MD     BS/MEDQ  D:  09/18/2016  T:  09/19/2016  Job:  [id number]

## 2016-09-20 LAB — BASIC METABOLIC PANEL
Anion gap: 8 (ref 5–15)
BUN: 17 mg/dL (ref 6–20)
CHLORIDE: 101 mmol/L (ref 101–111)
CO2: 24 mmol/L (ref 22–32)
CREATININE: 0.82 mg/dL (ref 0.44–1.00)
Calcium: 8 mg/dL — ABNORMAL LOW (ref 8.9–10.3)
GFR calc Af Amer: >60 mL/min (ref >60)
GFR calc non Af Amer: >60 mL/min (ref >60)
GLUCOSE: 166 mg/dL — AB (ref 65–99)
POTASSIUM: 3.9 mmol/L (ref 3.5–5.1)
Sodium: 133 mmol/L — ABNORMAL LOW (ref 135–145)

## 2016-09-20 LAB — CBC
HEMATOCRIT: 23.9 % — AB (ref 36.0–46.0)
Hemoglobin: 7.9 g/dL — ABNORMAL LOW (ref 12.0–15.0)
MCH: 29.3 pg (ref 26.0–34.0)
MCHC: 33.1 g/dL (ref 30.0–36.0)
MCV: 88.5 fL (ref 78.0–100.0)
PLATELETS: 189 10*3/uL (ref 150–400)
RBC: 2.7 MIL/uL — ABNORMAL LOW (ref 3.87–5.11)
RDW: 15 % (ref 11.5–15.5)
WBC: 12.6 10*3/uL — ABNORMAL HIGH (ref 4.0–10.5)

## 2016-09-20 MED ORDER — SODIUM CHLORIDE 0.9% FLUSH
10.0000 mL | INTRAVENOUS | Status: DC | PRN
  Administered 2016-09-21 – 2016-09-22 (×3): 10 mL
  Filled 2016-09-20 (×3): qty 40

## 2016-09-20 MED ORDER — ENSURE ENLIVE PO LIQD
237.0000 mL | Freq: Two times a day (BID) | ORAL | Status: DC
  Administered 2016-09-20 – 2016-09-22 (×4): 237 mL via ORAL

## 2016-09-20 NOTE — Progress Notes (Signed)
Peripherally Inserted Central Catheter/Midline Placement  The IV Nurse has discussed with the patient and/or persons authorized to consent for the patient, the purpose of this procedure and the potential benefits and risks involved with this procedure.  The benefits include less needle sticks, lab draws from the catheter, and the patient may be discharged home with the catheter. Risks include, but not limited to, infection, bleeding, blood clot (thrombus formation), and puncture of an artery; nerve damage and irregular heartbeat and possibility to perform a PICC exchange if needed/ordered by physician.  Alternatives to this procedure were also discussed.  Bard Power PICC patient education guide, fact sheet on infection prevention and patient information card has been provided to patient /or left at bedside.    PICC/Midline Placement Documentation        Akai Dollard, Nicolette Bang 09/20/2016, 3:47 PM

## 2016-09-20 NOTE — Progress Notes (Signed)
PT Cancellation Note  Patient Details Name: Dawn Lawson MRN: [id number] DOB: 12/19/1928   Cancelled Treatment:    Reason Eval/Treat Not Completed: Patient at procedure or test/unavailable Pt at procedure. PT will continue to follow acutely.    Sal[place] April, PTA Pager: [phone]   09/20/2016, 3:43 PM

## 2016-09-20 NOTE — Progress Notes (Signed)
OT Cancellation Note  Patient Details Name: Dawn Lawson MRN: [id number] DOB: 1929/01/01   Cancelled Treatment:    Reason Eval/Treat Not Completed: OT screened, pt with Medicare and D/C plan is SNF. Pt planning to D/C to [facility]. All further OT needs can be met at next venue of care. Acute OT will sign off.  [address], OTR/L [phone] 09/20/2016, 11:30 AM

## 2016-09-20 NOTE — Progress Notes (Signed)
   Subjective:  Patient reports pain as moderate.  No c/o.  Objective:   VITALS:   Vitals:   09/19/16 0900 09/19/16 1351 09/19/16 2051 09/20/16 0356  BP: (!) 127/40 (!) 135/37 133/68 (!) 143/55  Pulse: 75 72 87 79  Resp: 16 18 16 16   Temp: 98.4 F (36.9 C) 98.1 F (36.7 C) 100.2 F (37.9 C) 99.5 F (37.5 C)  TempSrc: Oral Oral Oral Oral  SpO2: 99% 95% 97% 97%  Weight:      Height:        NAD ABD soft Sensation intact distally Intact pulses distally Dorsiflexion/Plantar flexion intact Incision: dressing C/D/I Compartment soft Knee immobilizer intact Wound VAC intact HV ss   Lab Results  Component Value Date   WBC 12.6 (H) 09/20/2016   HGB 7.9 (L) 09/20/2016   HCT 23.9 (L) 09/20/2016   MCV 88.5 09/20/2016   PLT 189 09/20/2016   BMET    Component Value Date/Time   NA 133 (L) 09/20/2016 0414   K 3.9 09/20/2016 0414   CL 101 09/20/2016 0414   CO2 24 09/20/2016 0414   GLUCOSE 166 (H) 09/20/2016 0414   BUN 17 09/20/2016 0414   CREATININE 0.82 09/20/2016 0414   CALCIUM 8.0 (L) 09/20/2016 0414   GFRNONAA >60 09/20/2016 0414   GFRAA >60 09/20/2016 0414    Recent Results (from the past 240 hour(s))  Surgical pcr screen     Status: None   Collection Time: 09/15/16 11:21 AM  Result Value Ref Range Status   MRSA, PCR NEGATIVE NEGATIVE Final   Staphylococcus aureus NEGATIVE NEGATIVE Final    Comment:        The Xpert SA Assay (FDA approved for NASAL specimens in patients over 34 years of age), is one component of a comprehensive surveillance program.  Test performance has been validated by [facility] for patients greater than or equal to 102 year old. It is not intended to diagnose infection nor to guide or monitor treatment.   Aerobic/Anaerobic Culture (surgical/deep wound)     Status: None (Preliminary result)   Collection Time: 09/18/16  4:45 PM  Result Value Ref Range Status   Specimen Description WOUND RIGHT KNEE  Final   Special Requests NONE   Final   Gram Stain   Final    ABUNDANT WBC PRESENT,BOTH PMN AND MONONUCLEAR NO ORGANISMS SEEN    Culture CULTURE REINCUBATED FOR BETTER GROWTH  Final   Report Status PENDING  Incomplete      Assessment/Plan: 2 Days Post-Op   Principal Problem:   Infection of total right knee replacement (HCC)  R knee PJI s/p resection arthroplasty, GBS  TDWB RLE Knee immobilizer at all times Cont wound VAC ID rec IV ancef Follow cultures PT/OT DVT ppx: lovenox, SCDs, TEDs PICC today D/C HV drain D/C planning, follow cultures   Latesia Norrington, Horald Pollen 09/20/2016, 7:35 AM   Rod Can, MD Cell [phone]

## 2016-09-20 NOTE — Progress Notes (Signed)
Patient hemoglobin 7.9. Physician notified and made aware, nurse will continue to monitor Kaeo Jacome A Merian Wroe, RN

## 2016-09-20 NOTE — Progress Notes (Signed)
Initial Nutrition Assessment  DOCUMENTATION CODES:   Obesity unspecified  INTERVENTION:  Provide Ensure Enlive po BID, each supplement provides 350 kcal and 20 grams of protein  Encourage adequate PO intake.   NUTRITION DIAGNOSIS:   Increased nutrient needs related to wound healing as evidenced by estimated needs.  GOAL:   Patient will meet greater than or equal to 90% of their needs  MONITOR:   PO intake, Supplement acceptance, Labs, Weight trends, Skin, I & O's  REASON FOR ASSESSMENT:   Consult Wound healing (poor po)  ASSESSMENT:   82 y.o. female, has a history of pain and functional disability in the right knee(s) due to Infected previous arthroplasty.  The indications for the revision of the total knee arthroplasty are history of total knee infection.  PROCEDURE (11/27): RESECTION OF RIGHT TOTAL KNEE ARTHROPLASTY PLACEMENT OF ANTIBIOTIC SPACER (Right) APPLICATION OF WOUND VAC (Right)  Pt reports having a decreased appetite since admission. Meal completion has been 50%. Pt reports eating well PTA with usual consumption of at least 3 meals a day. Usual body weight unknown to pt. Pt is agreeable to nutritional supplements to aid in caloric and protein needs. RD to order. Pt educated on the importance of protein on wound healing.   Pt with no observed significant fat or muscle mass loss.   Labs and medications reviewed.   Diet Order:  Diet regular Room service appropriate? Yes; Fluid consistency: Thin  Skin:  Wound (see comment) (Incision on R knee with wound VAC)  Last BM:  11/26  Height:   Ht Readings from Last 1 Encounters:  09/18/16 5' (1.524 m)    Weight:   Wt Readings from Last 1 Encounters:  09/18/16 188 lb 8 oz (85.5 kg)    Ideal Body Weight:  45.45 kg  BMI:  Body mass index is 36.81 kg/m.  Estimated Nutritional Needs:   Kcal:  1700-1850  Protein:  85-95 grams  Fluid:  1.7 - 1.8 L/day  EDUCATION NEEDS:   Education needs  addressed  Corrin Parker, MS, RD, LDN Pager # [phone] After hours/ weekend pager # [phone]

## 2016-09-20 NOTE — Progress Notes (Signed)
Patient had picc line placed this afternoon. Appears to tolerate cefazolin. Her OR cultures growing rare group b strep. Plan for 6 wk of IV abtx. Will check sed rate and crp on morning labs

## 2016-09-21 LAB — BASIC METABOLIC PANEL
ANION GAP: 8 (ref 5–15)
BUN: 17 mg/dL (ref 6–20)
CALCIUM: 8 mg/dL — AB (ref 8.9–10.3)
CHLORIDE: 101 mmol/L (ref 101–111)
CO2: 26 mmol/L (ref 22–32)
CREATININE: 0.69 mg/dL (ref 0.44–1.00)
GFR calc non Af Amer: >60 mL/min (ref >60)
GLUCOSE: 136 mg/dL — AB (ref 65–99)
Potassium: 3.5 mmol/L (ref 3.5–5.1)
Sodium: 135 mmol/L (ref 135–145)

## 2016-09-21 LAB — C-REACTIVE PROTEIN: CRP: 19 mg/dL — ABNORMAL HIGH (ref <1.0)

## 2016-09-21 LAB — CBC
HCT: 22.9 % — ABNORMAL LOW (ref 36.0–46.0)
HEMOGLOBIN: 7.4 g/dL — AB (ref 12.0–15.0)
MCH: 28.6 pg (ref 26.0–34.0)
MCHC: 32.3 g/dL (ref 30.0–36.0)
MCV: 88.4 fL (ref 78.0–100.0)
Platelets: 199 10*3/uL (ref 150–400)
RBC: 2.59 MIL/uL — AB (ref 3.87–5.11)
RDW: 14.7 % (ref 11.5–15.5)
WBC: 12.2 10*3/uL — ABNORMAL HIGH (ref 4.0–10.5)

## 2016-09-21 LAB — SEDIMENTATION RATE: Sed Rate: 97 mm/hr — ABNORMAL HIGH (ref 0–22)

## 2016-09-21 MED ORDER — ENOXAPARIN SODIUM 40 MG/0.4ML ~~LOC~~ SOLN: 40.0000 mg | SUBCUTANEOUS | 0 refills | Status: DC

## 2016-09-21 MED ORDER — ONDANSETRON HCL 4 MG PO TABS: 4.0000 mg | ORAL_TABLET | Freq: Four times a day (QID) | ORAL | 0 refills | Status: DC | PRN

## 2016-09-21 MED ORDER — DOCUSATE SODIUM 100 MG PO CAPS: 100.0000 mg | ORAL_CAPSULE | Freq: Two times a day (BID) | ORAL | 0 refills | Status: DC

## 2016-09-21 MED ORDER — CEFAZOLIN SODIUM-DEXTROSE 2-4 GM/100ML-% IV SOLN: 2.0000 g | Freq: Three times a day (TID) | INTRAVENOUS | Status: DC

## 2016-09-21 MED ORDER — HYDROCODONE-ACETAMINOPHEN 5-325 MG PO TABS: 1.0000 | ORAL_TABLET | ORAL | 0 refills | Status: DC | PRN

## 2016-09-21 MED ORDER — POLYETHYLENE GLYCOL 3350 17 G PO PACK: 17.0000 g | PACK | Freq: Every day | ORAL | 0 refills | Status: DC | PRN

## 2016-09-21 MED ORDER — ENSURE ENLIVE PO LIQD: 237.0000 mL | Freq: Two times a day (BID) | ORAL | 12 refills | Status: DC

## 2016-09-21 MED ORDER — SENNA 8.6 MG PO TABS: 1.0000 | ORAL_TABLET | Freq: Two times a day (BID) | ORAL | 0 refills | Status: DC

## 2016-09-21 NOTE — Progress Notes (Signed)
[facility] for Infectious Disease    Date of Admission:  09/18/2016   Total days of antibiotics 4          Day 3 cefazolin          ID: Dawn Lawson is a 82 y.o. female with  Group b strep PJI s/p arthroplasty resection and abtx spacer placement Principal Problem:   Infection of total right knee replacement (HCC)    Subjective: Less knee pain. No fever or chills  Medications:  . bisoprolol-hydrochlorothiazide  1 tablet Oral Daily  .  ceFAZolin (ANCEF) IV  2 g Intravenous Q8H  . docusate sodium  100 mg Oral BID  . enoxaparin (LOVENOX) injection  40 mg Subcutaneous Q24H  . feeding supplement (ENSURE ENLIVE)  237 mL Oral BID BM  . senna  1 tablet Oral BID  . Vitamin D (Ergocalciferol)  50,000 Units Oral Q Thu    Objective: BP (!) 160/60 (BP Location: Left Arm)   Pulse 75   Temp 98.6 F (37 C) (Oral)   Resp 16   Ht 5' (1.524 m)   Wt 188 lb 8 oz (85.5 kg)   SpO2 97%   BMI 36.81 kg/m  Physical Exam  Constitutional:  oriented to person, place, and time. appears well-developed and well-nourished. No distress.  HENT: [place]/AT, PERRLA, no scleral icterus Mouth/Throat: Oropharynx is clear and moist. No oropharyngeal exudate.  Cardiovascular: Normal rate, regular rhythm and normal heart sounds. Exam reveals no gallop and no friction rub.  No murmur heard.  Pulmonary/Chest: Effort normal and breath sounds normal. No respiratory distress.  has no wheezes.  Abdominal: Soft. Bowel sounds are normal.  exhibits no distension. There is no tenderness.  Skin: right knee is wrapped with wound vac in place Psychiatric: a normal mood and affect.  behavior is normal.    Lab Results  Recent Labs  09/20/16 0414 09/21/16 0511  WBC 12.6* 12.2*  HGB 7.9* 7.4*  HCT 23.9* 22.9*  NA 133* 135  K 3.9 3.5  CL 101 101  CO2 24 26  BUN 17 17  CREATININE 0.82 0.69   Sedimentation Rate  Recent Labs  09/21/16 0511  ESRSEDRATE 97*   C-Reactive Protein  Recent Labs   09/21/16 0511  CRP 19.0*    Microbiology: 11/27 or cx GBS 11/28 blood cx NGTD Studies/Results: No results found.   Assessment/Plan: 82yo F with with  Group b strep PJI s/p arthroplasty resection and abtx spacer  - plan for 6 wk of IV abtx, will have iv abtx snf orders listed below as well as OPAT consult (outpatient abtx consultation) - we will see her back in 4-6 wk in the ID clinic, our clinic coordinator to arrange order  ---------------------------------------  Diagnosis: PJI  Culture Result: group b strep  Allergies  Allergen Reactions  . Penicillins Hives and Itching    Tolerated Ancef Nov 2017.  TDD  Has patient had a PCN reaction causing immediate rash, facial/tongue/throat swelling, SOB or lightheadedness with hypotension: NO Has patient had a PCN reaction causing severe rash involving mucus membranes or skin necrosis: {NO Has patient had a PCN reaction that required hospitalization {NO Has patient had a PCN reaction occurring within the last 10 years:  # # YES # #  If all of the above answers are "NO", then may proceed with Cephalosporin use.  . Ampicillin Hives and Itching    Discharge antibiotics: Per pharmacy protocol cefazolin 2gm IV Q 8hr  Duration: 6  wk End Date: Jan 2nd  [place] Per Protocol:  Labs weekly while on IV antibiotics: __x CBC with differential _x_ BMP _x_ CRP _xESR   _x_ Please pull PIC at completion of IV antibiotics   Fax weekly labs to [phone]  Clinic Follow Up Appt: 4-6 wk  @ [place], [facility] for Infectious Diseases Cell: [phone] Pager: [phone]  09/21/2016, 5:20 PM

## 2016-09-21 NOTE — Clinical Social Work Placement (Addendum)
   CLINICAL SOCIAL WORK PLACEMENT  NOTE  Date:  09/21/2016  Patient Details  Name: Dawn Lawson MRN: [id number] Date of Birth: 08-20-1929  Clinical Social Work is seeking post-discharge placement for this patient at the [place] level of care (*CSW will initial, date and re-position this form in  chart as items are completed):  Yes   Patient/family provided with [place] Work Department's list of facilities offering this level of care within the geographic area requested by the patient (or if unable, by the patient's family).  Yes   Patient/family informed of their freedom to choose among providers that offer the needed level of care, that participate in Medicare, Medicaid or managed care program needed by the patient, have an available bed and are willing to accept the patient.      Patient/family informed of [place]'s ownership interest in [facility] and [facility], as well as of the fact that they are under no obligation to receive care at these facilities.  PASRR submitted to EDS on       PASRR number received on 09/19/16     Existing PASRR number confirmed on       FL2 transmitted to all facilities in geographic area requested by pt/family on 09/19/16     FL2 transmitted to all facilities within larger geographic area on       Patient informed that his/her managed care company has contracts with or will negotiate with certain facilities, including the following:        Yes   Patient/family informed of bed offers received.  Patient chooses bed at [facility] [place] recommends and patient chooses bed at      Patient to be transferred to [facility] on 09/22/16.  Patient to be transferred to facility by [facility]     Patient family notified on 09/23/16 of transfer.  Name of family member notified:  [name]     PHYSICIAN Please prepare priority discharge summary, including medications, Please prepare  prescriptions     Additional Comment:    _______________________________________________ Alla German, LCSW 09/21/2016, 4:15 PM

## 2016-09-21 NOTE — Progress Notes (Signed)
Physical Therapy Treatment Patient Details Name: Dawn Lawson MRN: [id number] DOB: November 20, 1928 Today's Date: 09/21/2016    History of Present Illness Dawn Lawson, 82 y.o.female,has a history of pain and functional disability in the rightknee(s)due toInfected previous arthroplasty. The indications for the revisionof thetotal knee arthroplasty arehistory of total knee infection. Pt s/p resection of R TKA placement of antibiotic spacer and application of wound vac.    PT Comments    Pt limited by TDWB status and has difficulty maintaining with standing.  Con't to recommend SNF.  Follow Up Recommendations  SNF;Supervision/Assistance - 24 hour     Equipment Recommendations  None recommended by PT    Recommendations for Other Services       Precautions / Restrictions Precautions Precautions: Fall Precaution Comments: wound vac,  Required Braces or Orthoses: Knee Immobilizer - Right Knee Immobilizer - Right: On at all times Restrictions Weight Bearing Restrictions: Yes RLE Weight Bearing: Touchdown weight bearing    Mobility  Bed Mobility Overal bed mobility: Needs Assistance;+2 for physical assistance Bed Mobility: Supine to Sit     Supine to sit: Mod assist;+2 for physical assistance;Max assist     General bed mobility comments: Pt attempting to A and able to grab rail,  needed MOD A of 2 at first, and then MAX of 2 to complete.  Difficulty bending forward at hips.  Transfers Overall transfer level: Needs assistance Equipment used: 2 person hand held assist;Rolling walker (2 wheeled) Transfers: Sit to/from [facility] Sit to Stand: Max assist;+2 physical assistance Stand pivot transfers: Max assist;+2 physical assistance       General transfer comment: Pt had difficulty getting upright on RW due to TTWB on R LE.  SPT to recliner with MAX of 2 and pt fearful  Ambulation/Gait                 Stairs            Wheelchair  Mobility    Modified Rankin (Stroke Patients Only)       Balance     Sitting balance-Leahy Scale: Fair     Standing balance support: Bilateral upper extremity supported Standing balance-Leahy Scale: Poor                      Cognition Arousal/Alertness: Awake/alert Behavior During Therapy: WFL for tasks assessed/performed Overall Cognitive Status: Within Functional Limits for tasks assessed                      Exercises Total Joint Exercises Ankle Circles/Pumps: AROM;Both;10 reps Quad Sets: Strengthening;Right;10 reps Heel Slides: AAROM;Right;10 reps (very limited movement)    General Comments        Pertinent Vitals/Pain Pain Assessment: Faces Faces Pain Scale: Hurts even more Pain Descriptors / Indicators: Grimacing;Operative site guarding Pain Intervention(s): Limited activity within patient's tolerance;Monitored during session;Repositioned    Home Living                      Prior Function            PT Goals (current goals can now be found in the care plan section) Acute Rehab PT Goals PT Goal Formulation: With patient Time For Goal Achievement: 09/26/16 Potential to Achieve Goals: Good Progress towards PT goals: Progressing toward goals    Frequency    Min 5X/week      PT Plan Current plan remains appropriate    Co-evaluation  End of Session Equipment Utilized During Treatment: Gait belt;Right knee immobilizer Activity Tolerance: Patient limited by pain Patient left: in chair;with call bell/phone within reach     Time: 1135-1153 PT Time Calculation (min) (ACUTE ONLY): 18 min  Charges:  $Therapeutic Activity: 8-22 mins                    G Codes:      Dawn Lawson 09/21/2016, 12:04 PM

## 2016-09-21 NOTE — Progress Notes (Signed)
Subjective:  Patient reports pain as moderate.  No c/o. PICC placed yesterday.  Objective:   VITALS:   Vitals:   09/20/16 0356 09/20/16 1243 09/20/16 1945 09/21/16 0532  BP: (!) 143/55 (!) 135/45 130/60 (!) 193/81  Pulse: 79 75 89 81  Resp: 16 16 16 16   Temp: 99.5 F (37.5 C) 98.9 F (37.2 C) (!) 100.5 F (38.1 C) 98.6 F (37 C)  TempSrc: Oral Oral Oral Oral  SpO2: 97% 99% 98% 100%  Weight:      Height:        NAD ABD soft Sensation intact distally Intact pulses distally Dorsiflexion/Plantar flexion intact Incision: dressing C/D/I Compartment soft Knee immobilizer intact Wound VAC intact   Lab Results  Component Value Date   WBC 12.2 (H) 09/21/2016   HGB 7.4 (L) 09/21/2016   HCT 22.9 (L) 09/21/2016   MCV 88.4 09/21/2016   PLT 199 09/21/2016   BMET    Component Value Date/Time   NA 135 09/21/2016 0511   K 3.5 09/21/2016 0511   CL 101 09/21/2016 0511   CO2 26 09/21/2016 0511   GLUCOSE 136 (H) 09/21/2016 0511   BUN 17 09/21/2016 0511   CREATININE 0.69 09/21/2016 0511   CALCIUM 8.0 (L) 09/21/2016 0511   GFRNONAA >60 09/21/2016 0511   GFRAA >60 09/21/2016 0511    Recent Results (from the past 240 hour(s))  Surgical pcr screen     Status: None   Collection Time: 09/15/16 11:21 AM  Result Value Ref Range Status   MRSA, PCR NEGATIVE NEGATIVE Final   Staphylococcus aureus NEGATIVE NEGATIVE Final    Comment:        The Xpert SA Assay (FDA approved for NASAL specimens in patients over 4 years of age), is one component of a comprehensive surveillance program.  Test performance has been validated by [facility] for patients greater than or equal to 34 year old. It is not intended to diagnose infection nor to guide or monitor treatment.   Aerobic/Anaerobic Culture (surgical/deep wound)     Status: None (Preliminary result)   Collection Time: 09/18/16  4:45 PM  Result Value Ref Range Status   Specimen Description WOUND RIGHT KNEE  Final   Special  Requests NONE  Final   Gram Stain   Final    ABUNDANT WBC PRESENT,BOTH PMN AND MONONUCLEAR NO ORGANISMS SEEN    Culture   Final    RARE GROUP B STREP(S.AGALACTIAE)ISOLATED TESTING AGAINST S. AGALACTIAE NOT ROUTINELY PERFORMED DUE TO PREDICTABILITY OF AMP/PEN/VAN SUSCEPTIBILITY. NO ANAEROBES ISOLATED; CULTURE IN PROGRESS FOR 5 DAYS    Report Status PENDING  Incomplete  Culture, blood (routine x 2)     Status: None (Preliminary result)   Collection Time: 09/19/16  1:50 PM  Result Value Ref Range Status   Specimen Description BLOOD RIGHT HAND  Final   Special Requests IN PEDIATRIC BOTTLE 3CC  Final   Culture NO GROWTH < 24 HOURS  Final   Report Status PENDING  Incomplete  Culture, blood (routine x 2)     Status: None (Preliminary result)   Collection Time: 09/19/16  1:57 PM  Result Value Ref Range Status   Specimen Description BLOOD RIGHT HAND  Final   Special Requests IN PEDIATRIC BOTTLE 3CC  Final   Culture NO GROWTH < 24 HOURS  Final   Report Status PENDING  Incomplete      Assessment/Plan: 3 Days Post-Op   Principal Problem:   Infection of total right knee replacement (  [place])  R knee PJI s/p resection arthroplasty, GBS  TDWB RLE Knee immobilizer at all times Cont wound VAC ID rec IV ancef Follow cultures PT/OT DVT ppx: lovenox, SCDs, TEDs Pulmonary toilet ABLA: hgb down to 7.4, asymptomatic, recheck in am D/C planning, follow cultures, plan to switch wound VAC to Prevena tomorrow, possible d/c tomorrow   Kallin Henk, Horald Pollen 09/21/2016, 7:19 AM   Rod Can, MD Cell [phone]

## 2016-09-21 NOTE — Discharge Summary (Addendum)
Physician Discharge Summary  Patient ID: DENEASE KEENEN MRN: [id number] DOB/AGE: 1929/02/04 82 y.o.  Admit date: 09/18/2016 Discharge date: 09/22/2016  Admission Diagnoses:  Infection of total right knee replacement [facility])  Discharge Diagnoses:  Principal Problem:   Infection of total right knee replacement [facility])   Past Medical History:  Diagnosis Date  . AKI (acute kidney injury) ([place]) 03/22/2014  . Arthritis   . ATN (acute tubular necrosis) ([place]) 03/22/2014  . Cataract   . Heart murmur   . Hypertension   . Lactic acidosis 03/22/2014  . PONV (postoperative nausea and vomiting)   . Sepsis due to group B Streptococcus ([place]) 04/14/2014  . Severe sepsis with acute organ dysfunction ([place]) 03/22/2014  . Status post PICC central line placement 04/14/2014  . Urinary tract infection 03/22/2014    Surgeries: Procedure(s): RESECTION OF RIGHT TOTAL KNEE ARTHROPLASTY PLACEMENT OF ANTIBIOTIC SPACER APPLICATION OF WOUND VAC on 09/18/2016   Consultants (if any):   Discharged Condition: Improved  Hospital Course: Dawn Lawson is an 82 y.o. female who was admitted 09/18/2016 with a diagnosis of Infection of total right knee replacement ([place]) and went to the operating room on 09/18/2016 and underwent the above named procedures.    She was given perioperative antibiotics:  Anti-infectives    Start     Dose/Rate Route Frequency Ordered Stop   09/21/16 0000  ceFAZolin (ANCEF) 2-4 GM/100ML-% IVPB    Comments:  2 g IV every 8 hours for 6 weeks   2 g 200 mL/hr over 30 Minutes Intravenous Every 8 hours 09/21/16 0734     09/19/16 1000  ceFAZolin (ANCEF) IVPB 2g/100 mL premix     2 g 200 mL/hr over 30 Minutes Intravenous Every 8 hours 09/19/16 0932     09/18/16 2200  vancomycin (VANCOCIN) IVPB 750 mg/150 ml premix  Status:  Discontinued     750 mg 150 mL/hr over 60 Minutes Intravenous Every 12 hours 09/18/16 2157 09/19/16 0909   09/18/16 1640  vancomycin (VANCOCIN) powder  Status:  Discontinued        As needed 09/18/16 1641 09/18/16 2016   09/18/16 1315  vancomycin (VANCOCIN) 1,500 mg in sodium chloride 0.9 % 500 mL IVPB     1,500 mg 250 mL/hr over 120 Minutes Intravenous To [facility] 09/17/16 1314 09/18/16 1534    .  She was given sequential compression devices and lovenox for DVT prophylaxis.  Cultres grew GBS. ID (Dr. Baxter Flattery) recommended 6 weeks of IV ancef.  She benefited maximally from the hospital stay and there were no complications.    Recent vital signs:  Vitals:   09/21/16 2042 09/22/16 0403  BP: (!) 165/57 (!) 180/60  Pulse: 82 86  Resp: 16 18  Temp: 99.5 F (37.5 C) 99 F (37.2 C)    Recent laboratory studies:  Lab Results  Component Value Date   HGB 7.3 (L) 09/22/2016   HGB 7.4 (L) 09/21/2016   HGB 7.9 (L) 09/20/2016   Lab Results  Component Value Date   WBC 10.9 (H) 09/22/2016   PLT 245 09/22/2016   Lab Results  Component Value Date   INR 1.92 SPECIMEN CHECKED FOR CLOTS (H) 01/13/2011   Lab Results  Component Value Date   NA 135 09/21/2016   K 3.5 09/21/2016   CL 101 09/21/2016   CO2 26 09/21/2016   BUN 17 09/21/2016   CREATININE 0.69 09/21/2016   GLUCOSE 136 (H) 09/21/2016    Discharge Medications:     Medication  List    STOP taking these medications   acetaminophen 325 MG tablet Commonly known as:  TYLENOL   cephALEXin 500 MG capsule Commonly known as:  KEFLEX     TAKE these medications   aspirin EC 81 MG tablet Take 81 mg by mouth daily.   bisoprolol-hydrochlorothiazide 2.5-6.25 MG tablet Commonly known as:  ZIAC Take 1 tablet by mouth daily.   ceFAZolin 2-4 GM/100ML-% IVPB Commonly known as:  ANCEF Inject 100 mLs (2 g total) into the vein every 8 (eight) hours.   docusate sodium 100 MG capsule Commonly known as:  COLACE Take 1 capsule (100 mg total) by mouth 2 (two) times daily.   enoxaparin 40 MG/0.4ML injection Commonly known as:  LOVENOX Inject 0.4 mLs (40 mg total) into the skin daily.   feeding  supplement (ENSURE ENLIVE) Liqd Take 237 mLs by mouth 2 (two) times daily between meals.   HYDROcodone-acetaminophen 5-325 MG tablet Commonly known as:  NORCO/VICODIN Take 1-2 tablets by mouth every 4 (four) hours as needed (breakthrough pain).   MULTIVITAMIN PO Take 1 tablet by mouth daily.   PRESERVISION AREDS PO Take 1 capsule by mouth 2 (two) times daily.   ondansetron 4 MG tablet Commonly known as:  ZOFRAN Take 1 tablet (4 mg total) by mouth every 6 (six) hours as needed for nausea.   polyethylene glycol packet Commonly known as:  MIRALAX / GLYCOLAX Take 17 g by mouth daily as needed for mild constipation.   polyvinyl alcohol 1.4 % ophthalmic solution Commonly known as:  LIQUIFILM TEARS Place 1 drop into both eyes as needed for dry eyes.   senna 8.6 MG Tabs tablet Commonly known as:  SENOKOT Take 1 tablet (8.6 mg total) by mouth 2 (two) times daily.   Vitamin D (Ergocalciferol) 50000 units Caps capsule Commonly known as:  DRISDOL Take 50,000 Units by mouth every Thursday.       Diagnostic Studies: Dg Knee Right Port  Result Date: 09/18/2016 CLINICAL DATA:  Right postoperative image, static antibiotic spacer in place. EXAM: PORTABLE RIGHT KNEE - 1-2 VIEW COMPARISON:  10/18/2014 FINDINGS: There is a surgical drain present with tip over the lateral aspect of the knee joint. There is an intra medullary rod extending from the femur through the knee joint into the tibia. There is moderate radiodense material overlying the knee joint, distal femur proximal tibia compatible with history of static antibiotic spacer. Postsurgical change over the superficial soft tissues of the knee. IMPRESSION: Postoperative changes of the right knee as described with hardware intact and antibiotic spacer is well as surgical drain in place. Electronically Signed   By: Marin Olp M.D.   On: 09/18/2016 20:40    Disposition: 83-Discharged to home/self-care with a planned acute care hospital inpt  readmission  Discharge Instructions    Call MD / Call 911    Complete by:  As directed    If you experience chest pain or shortness of breath, CALL 911 and be transported to the hospital emergency room.  If you develope a fever above 101 F, pus (white drainage) or increased drainage or redness at the wound, or calf pain, call your surgeon's office.   Constipation Prevention    Complete by:  As directed    Drink plenty of fluids.  Prune juice may be helpful.  You may use a stool softener, such as Colace (over the counter) 100 mg twice a day.  Use MiraLax (over the counter) for constipation as needed.   Diet -  low sodium heart healthy    Complete by:  As directed    Discharge instructions    Complete by:  As directed    Touch down weight bearing right leg Knee immobilizer at all times Continue Prevena wound VAC IV ancef 2 grams every 8 hours for 6 weeks   Driving restrictions    Complete by:  As directed    No driving for 12 weeks   Increase activity slowly as tolerated    Complete by:  As directed    Lifting restrictions    Complete by:  As directed    No lifting for 12 weeks      Discharge antibiotics: Per pharmacy protocol cefazolin 2gm IV Q 8hr  Duration: 6 wk End Date: Jan 2nd  [place] Per Protocol:  Labs weekly while on IV antibiotics: __x CBC with differential _x_ BMP _x_ CRP _xESR   _x_ Please pull PIC at completion of IV antibiotics   Fax weekly labs to [phone]  Clinic Follow Up Appt: 4-6 wk  @ [facility] Cell: [phone] Pager: [phone]    Follow-up Information    Estaban [place], Horald Pollen, MD. Schedule an appointment as soon as possible for a visit on 09/29/2016.   Specialty:  Orthopedic Surgery Why:  For wound re-check Contact information: [address] [place] [address] [phone]        Carlyle Basques, MD. Schedule an appointment as soon  as possible for a visit in 4 week(s).   Specialty:  Infectious Diseases Contact information: [address] [place] [address] [phone]            Signed: Elie Goody 09/22/2016, 8:47 AM

## 2016-09-21 NOTE — Consult Note (Signed)
           [facility] CM Primary Care Navigator  09/21/2016  LIBIA FYE 1929-08-21 [id number]   Patient seen at the bedside to evaluate discharge needs. Patient reports noted draining to surgical site, worsening pain with activity and functional limitation to rightknee that led to this admission. Discharge plan is for skilled nursing facility [facility]) for short term rehabilitation. She will be discharged with PICC line for IV antibiotics. Patient confirms that primary provider is Dr. Tamsen Roers with [facility]. Transportation to doctor's appointment is provided by son Quillian Quince).  Patient states using [place] to obtain medications without difficulty. Patient does her own medication management at home straight out of bottle containers (does not take much medicines per patient). Patient lives alone and is independent with self care prior to admission. Son and daughter in-law Marliss Czar) will be able to assist with her care needs when she returns home from [place] as stated.  Patient had expressed understanding to call primary care provider's office when she gets back home, for a post discharge follow-up appointment within a week or sooner if needed. Patient letter provided as a reminder.  Patient denies any needs, issues or concerns at this time.   For questions, please contact:  Dannielle Huh, BSN, RN- [facility] Primary Care Navigator  Telephone: [phone] [place]

## 2016-09-22 DIAGNOSIS — R05 Cough: Secondary | ICD-10-CM | POA: Diagnosis not present

## 2016-09-22 DIAGNOSIS — T8459XD Infection and inflammatory reaction due to other internal joint prosthesis, subsequent encounter: Secondary | ICD-10-CM | POA: Diagnosis not present

## 2016-09-22 DIAGNOSIS — Z4789 Encounter for other orthopedic aftercare: Secondary | ICD-10-CM | POA: Diagnosis not present

## 2016-09-22 DIAGNOSIS — M1991 Primary osteoarthritis, unspecified site: Secondary | ICD-10-CM | POA: Diagnosis not present

## 2016-09-22 DIAGNOSIS — Z96651 Presence of right artificial knee joint: Secondary | ICD-10-CM | POA: Diagnosis not present

## 2016-09-22 DIAGNOSIS — R0989 Other specified symptoms and signs involving the circulatory and respiratory systems: Secondary | ICD-10-CM | POA: Diagnosis not present

## 2016-09-22 DIAGNOSIS — D649 Anemia, unspecified: Secondary | ICD-10-CM | POA: Diagnosis not present

## 2016-09-22 DIAGNOSIS — I10 Essential (primary) hypertension: Secondary | ICD-10-CM | POA: Diagnosis not present

## 2016-09-22 DIAGNOSIS — Z4733 Aftercare following explantation of knee joint prosthesis: Secondary | ICD-10-CM | POA: Diagnosis not present

## 2016-09-22 DIAGNOSIS — E559 Vitamin D deficiency, unspecified: Secondary | ICD-10-CM | POA: Diagnosis not present

## 2016-09-22 DIAGNOSIS — K5900 Constipation, unspecified: Secondary | ICD-10-CM | POA: Diagnosis not present

## 2016-09-22 DIAGNOSIS — B999 Unspecified infectious disease: Secondary | ICD-10-CM | POA: Diagnosis not present

## 2016-09-22 DIAGNOSIS — M1990 Unspecified osteoarthritis, unspecified site: Secondary | ICD-10-CM | POA: Diagnosis not present

## 2016-09-22 DIAGNOSIS — M25569 Pain in unspecified knee: Secondary | ICD-10-CM | POA: Diagnosis not present

## 2016-09-22 DIAGNOSIS — E669 Obesity, unspecified: Secondary | ICD-10-CM | POA: Diagnosis not present

## 2016-09-22 DIAGNOSIS — T8453XD Infection and inflammatory reaction due to internal right knee prosthesis, subsequent encounter: Secondary | ICD-10-CM | POA: Diagnosis not present

## 2016-09-22 DIAGNOSIS — A401 Sepsis due to streptococcus, group B: Secondary | ICD-10-CM | POA: Diagnosis not present

## 2016-09-22 DIAGNOSIS — T8450XD Infection and inflammatory reaction due to unspecified internal joint prosthesis, subsequent encounter: Secondary | ICD-10-CM | POA: Diagnosis not present

## 2016-09-22 LAB — CBC
HCT: 23 % — ABNORMAL LOW (ref 36.0–46.0)
HEMOGLOBIN: 7.3 g/dL — AB (ref 12.0–15.0)
MCH: 28.2 pg (ref 26.0–34.0)
MCHC: 31.7 g/dL (ref 30.0–36.0)
MCV: 88.8 fL (ref 78.0–100.0)
Platelets: 245 10*3/uL (ref 150–400)
RBC: 2.59 MIL/uL — AB (ref 3.87–5.11)
RDW: 14.7 % (ref 11.5–15.5)
WBC: 10.9 10*3/uL — ABNORMAL HIGH (ref 4.0–10.5)

## 2016-09-22 MED ORDER — SODIUM CHLORIDE 0.9% FLUSH: 10.0000 mL | INTRAVENOUS | Status: DC | PRN

## 2016-09-22 MED ORDER — HEPARIN SOD (PORK) LOCK FLUSH 100 UNIT/ML IV SOLN
250.0000 [IU] | INTRAVENOUS | Status: AC | PRN
  Administered 2016-09-22: 250 [IU]
  Filled 2016-09-22: qty 3

## 2016-09-22 MED ORDER — CEFAZOLIN IV (FOR PTA / DISCHARGE USE ONLY): 2.0000 g | Freq: Three times a day (TID) | INTRAVENOUS | 0 refills | Status: DC

## 2016-09-22 NOTE — Care Management Important Message (Signed)
Important Message  Patient Details  Name: Dawn Lawson MRN: [id number] Date of Birth: [birth year]   Medicare Important Message Given:  Yes    Akshat Minehart Abena 09/22/2016, 1:08 PM

## 2016-09-22 NOTE — Discharge Instructions (Signed)
Touch Down Weight Bearing Right Leg Knee immobilizer while OOB Continue wound VAC - do not remove     Discharge antibiotics: Per pharmacy protocol cefazolin 2gm IV Q 8hr  Duration: 6 wk End Date: Jan 2nd  [place] Per Protocol:  Labs weekly while on IV antibiotics: __x CBC with differential _x_ BMP _x_ CRP _xESR   _x_ Please pull PIC at completion of IV antibiotics   Fax weekly labs to [phone]

## 2016-09-22 NOTE — Clinical Social Work Note (Signed)
Clinical Social Worker facilitated patient discharge including contacting patient family and facility to confirm patient discharge plans.  Clinical information faxed to facility and family agreeable with plan.  CSW arranged ambulance transport via [facility] to [facility] .  RN to call [phone] report prior to discharge.  Clinical Social Worker will sign off for now as social work intervention is no longer needed. Please consult [place] again if new need arises.  [address], [place]

## 2016-09-22 NOTE — Progress Notes (Signed)
Subjective:  Patient reports pain as moderate.  No c/o.  Objective:   VITALS:   Vitals:   09/21/16 0532 09/21/16 1500 09/21/16 2042 09/22/16 0403  BP: (!) 193/81 (!) 160/60 (!) 165/57 (!) 180/60  Pulse: 81 75 82 86  Resp: 16 16 16 18   Temp: 98.6 F (37 C) 98.6 F (37 C) 99.5 F (37.5 C) 99 F (37.2 C)  TempSrc: Oral Oral Oral Oral  SpO2: 100% 97% 94% 95%  Weight:      Height:        NAD ABD soft Sensation intact distally Intact pulses distally Dorsiflexion/Plantar flexion intact Incision: dressing C/D/I Compartment soft Knee immobilizer intact Wound VAC  Taken down - incis c/d/i. Cellulitis much improved   Lab Results  Component Value Date   WBC 10.9 (H) 09/22/2016   HGB 7.3 (L) 09/22/2016   HCT 23.0 (L) 09/22/2016   MCV 88.8 09/22/2016   PLT 245 09/22/2016   BMET    Component Value Date/Time   NA 135 09/21/2016 0511   K 3.5 09/21/2016 0511   CL 101 09/21/2016 0511   CO2 26 09/21/2016 0511   GLUCOSE 136 (H) 09/21/2016 0511   BUN 17 09/21/2016 0511   CREATININE 0.69 09/21/2016 0511   CALCIUM 8.0 (L) 09/21/2016 0511   GFRNONAA >60 09/21/2016 0511   GFRAA >60 09/21/2016 0511    Recent Results (from the past 240 hour(s))  Surgical pcr screen     Status: None   Collection Time: 09/15/16 11:21 AM  Result Value Ref Range Status   MRSA, PCR NEGATIVE NEGATIVE Final   Staphylococcus aureus NEGATIVE NEGATIVE Final    Comment:        The Xpert SA Assay (FDA approved for NASAL specimens in patients over 72 years of age), is one component of a comprehensive surveillance program.  Test performance has been validated by [facility] for patients greater than or equal to 64 year old. It is not intended to diagnose infection nor to guide or monitor treatment.   Aerobic/Anaerobic Culture (surgical/deep wound)     Status: None (Preliminary result)   Collection Time: 09/18/16  4:45 PM  Result Value Ref Range Status   Specimen Description WOUND RIGHT KNEE   Final   Special Requests NONE  Final   Gram Stain   Final    ABUNDANT WBC PRESENT,BOTH PMN AND MONONUCLEAR NO ORGANISMS SEEN    Culture   Final    RARE GROUP B STREP(S.AGALACTIAE)ISOLATED TESTING AGAINST S. AGALACTIAE NOT ROUTINELY PERFORMED DUE TO PREDICTABILITY OF AMP/PEN/VAN SUSCEPTIBILITY. NO ANAEROBES ISOLATED; CULTURE IN PROGRESS FOR 5 DAYS    Report Status PENDING  Incomplete  Culture, blood (routine x 2)     Status: None (Preliminary result)   Collection Time: 09/19/16  1:50 PM  Result Value Ref Range Status   Specimen Description BLOOD RIGHT HAND  Final   Special Requests IN PEDIATRIC BOTTLE 3CC  Final   Culture NO GROWTH 2 DAYS  Final   Report Status PENDING  Incomplete  Culture, blood (routine x 2)     Status: None (Preliminary result)   Collection Time: 09/19/16  1:57 PM  Result Value Ref Range Status   Specimen Description BLOOD RIGHT HAND  Final   Special Requests IN PEDIATRIC BOTTLE 3CC  Final   Culture NO GROWTH 2 DAYS  Final   Report Status PENDING  Incomplete      Assessment/Plan: 4 Days Post-Op   Principal Problem:   Infection of total  right knee replacement (H[facility])  R knee PJI s/p resection arthroplasty, GBS  TDWB RLE Knee immobilizer at all times Cont wound VAC ID rec IV ancef Follow cultures PT/OT DVT ppx: lovenox, SCDs, TEDs Pulmonary toilet ABLA: hgb stable at 7.4, asymptomatic D/C to SNF [facility] today   Ridge [place], Horald Pollen 09/22/2016, 8:40 AM   Rod Can, MD Cell [phone]

## 2016-09-22 NOTE — Clinical Social Work Note (Signed)
Per RN, [facility] can not provide pt with antibiotics because they can not get them filled at the facility after 2:00, at this time [facility] is asking CSW to cancel transportation. Per RN at this time, pic line has been clamped and transportation has CSW has called transportation. CSW spoke with [place] at [facility]. Per Loree Fee, admission coordinator at [facility], she forgot to reach out to CSW in order to make sure the pt gets their dose of antibiotics before pt arrives at facility. RN will send the antibiotics with the pt at discharge. Per Loree Fee with [facility] this is ok to do. CSW will sign off at this time as no further CSW needs. Please contact if a new needs arise.   [address] [place]

## 2016-09-22 NOTE — Plan of Care (Signed)
Problem: Physical Regulation: Goal: Will remain free from infection Outcome: Progressing No s/s of infection noted VS WNL  Problem: Tissue Perfusion: Goal: Risk factors for ineffective tissue perfusion will decrease Outcome: Progressing No s/s of dvt noted  Problem: Activity: Goal: Will remain free from falls Outcome: Progressing No fall this shift  Problem: Pain Management: Goal: Pain level will decrease with appropriate interventions Outcome: Progressing Medicated once for pain with full relief

## 2016-09-22 NOTE — Progress Notes (Signed)
RN Mickaela Starlin called report to [facility] LPN [employer]. LPN Rico Junker asked RN Debbra Riding if patient had received 2pm Ancef Antiobiotic, RN Bj Morlock stated "No, transport is in route to pick up patient at 1pm, antibiotic is due at 2pm" It was at that time that [facility] was told by LPN Rico Junker that "patient was supposed to receive Antibiotic before discharge." RN Delos Klich unable to give Antibiotic now becasue, PICC has been heparin locked by IV team, and RN Debbra Riding is not allowed to re-access PICC per hospital policy. RN Debbra Riding called [facility] [place] who spoke with SW at [place] burn who FORGOT to relay message that antibiotic needed to be given. Transport in route, [employer] spoke with Shelton Silvas who stated pharmacy said med can be sent with patient at time of discharge. RN will continue to monitor patient, charge RN Tranace aware of situation. RN Laterrian Hevener called back to LPN Tonya to tell her med will be sent with patient so that 2pm antibiotic administration is not missed. LPN Kenney Houseman stated med will be given upon arrival.

## 2016-09-23 LAB — AEROBIC/ANAEROBIC CULTURE W GRAM STAIN (SURGICAL/DEEP WOUND)

## 2016-09-23 LAB — AEROBIC/ANAEROBIC CULTURE (SURGICAL/DEEP WOUND)

## 2016-09-24 LAB — CULTURE, BLOOD (ROUTINE X 2)
Culture: NO GROWTH
Culture: NO GROWTH

## 2016-09-25 DIAGNOSIS — M1991 Primary osteoarthritis, unspecified site: Secondary | ICD-10-CM | POA: Diagnosis not present

## 2016-09-25 DIAGNOSIS — K5900 Constipation, unspecified: Secondary | ICD-10-CM | POA: Diagnosis not present

## 2016-09-25 DIAGNOSIS — I10 Essential (primary) hypertension: Secondary | ICD-10-CM | POA: Diagnosis not present

## 2016-09-25 DIAGNOSIS — T8453XD Infection and inflammatory reaction due to internal right knee prosthesis, subsequent encounter: Secondary | ICD-10-CM | POA: Diagnosis not present

## 2016-10-06 DIAGNOSIS — Z96651 Presence of right artificial knee joint: Secondary | ICD-10-CM | POA: Diagnosis not present

## 2016-10-06 DIAGNOSIS — T8459XD Infection and inflammatory reaction due to other internal joint prosthesis, subsequent encounter: Secondary | ICD-10-CM | POA: Diagnosis not present

## 2016-10-06 DIAGNOSIS — Z4789 Encounter for other orthopedic aftercare: Secondary | ICD-10-CM | POA: Diagnosis not present

## 2016-10-09 ENCOUNTER — Telehealth: Payer: Self-pay

## 2016-10-09 NOTE — Telephone Encounter (Signed)
Voice Mail:   Questions regarding IV stop dates.   Routed to pharmacy.

## 2016-10-09 NOTE — Telephone Encounter (Signed)
Called and left VM for Lashonna to call me back regarding questions.

## 2016-10-13 DIAGNOSIS — Z96651 Presence of right artificial knee joint: Secondary | ICD-10-CM | POA: Diagnosis not present

## 2016-10-13 DIAGNOSIS — T8459XD Infection and inflammatory reaction due to other internal joint prosthesis, subsequent encounter: Secondary | ICD-10-CM | POA: Diagnosis not present

## 2016-10-29 DIAGNOSIS — N390 Urinary tract infection, site not specified: Secondary | ICD-10-CM | POA: Diagnosis not present

## 2016-10-31 ENCOUNTER — Encounter: Payer: Self-pay | Admitting: Internal Medicine

## 2016-10-31 ENCOUNTER — Ambulatory Visit (INDEPENDENT_AMBULATORY_CARE_PROVIDER_SITE_OTHER): Payer: Medicare Other | Admitting: Internal Medicine

## 2016-10-31 VITALS — BP 149/53 | HR 68 | Temp 98.7°F | Ht 59.75 in | Wt 183.0 lb

## 2016-10-31 DIAGNOSIS — T8450XS Infection and inflammatory reaction due to unspecified internal joint prosthesis, sequela: Secondary | ICD-10-CM

## 2016-10-31 DIAGNOSIS — N3000 Acute cystitis without hematuria: Secondary | ICD-10-CM

## 2016-10-31 LAB — CBC WITH DIFFERENTIAL/PLATELET
BASOS PCT: 1 %
Basophils Absolute: 107 cells/uL (ref 0–200)
Eosinophils Absolute: 214 cells/uL (ref 15–500)
Eosinophils Relative: 2 %
HCT: 31.5 % — ABNORMAL LOW (ref 35.0–45.0)
HEMOGLOBIN: 9.8 g/dL — AB (ref 11.7–15.5)
LYMPHS ABS: 1819 {cells}/uL (ref 850–3900)
Lymphocytes Relative: 17 %
MCH: 26.9 pg — ABNORMAL LOW (ref 27.0–33.0)
MCHC: 31.1 g/dL — ABNORMAL LOW (ref 32.0–36.0)
MCV: 86.5 fL (ref 80.0–100.0)
MPV: 9 fL (ref 7.5–12.5)
Monocytes Absolute: 856 cells/uL (ref 200–950)
Monocytes Relative: 8 %
NEUTROS ABS: 7704 {cells}/uL (ref 1500–7800)
Neutrophils Relative %: 72 %
Platelets: 392 10*3/uL (ref 140–400)
RBC: 3.64 MIL/uL — AB (ref 3.80–5.10)
RDW: 16 % — ABNORMAL HIGH (ref 11.0–15.0)
WBC: 10.7 10*3/uL (ref 3.8–10.8)

## 2016-10-31 LAB — BASIC METABOLIC PANEL
BUN: 21 mg/dL (ref 7–25)
CALCIUM: 9 mg/dL (ref 8.6–10.4)
CO2: 25 mmol/L (ref 20–31)
Chloride: 100 mmol/L (ref 98–110)
Creat: 0.69 mg/dL (ref 0.60–0.88)
Glucose, Bld: 133 mg/dL — ABNORMAL HIGH (ref 65–99)
Potassium: 4.8 mmol/L (ref 3.5–5.3)
SODIUM: 137 mmol/L (ref 135–146)

## 2016-10-31 NOTE — Progress Notes (Signed)
Patient ID: Dawn Lawson, female   DOB: 15-Feb-1929, 83 y.o.   MRN: [id number]  HPI Dawn Lawson is an 83yo F who had a chronically infection PJI of right knee. She underwent resection with abtx spacer on 11/27 by Dr Lyla Glassing. She was discharged on 6 wk of abtx which she has finished. She is on track to have right knee arthrodesis  Outpatient Encounter Prescriptions as of 10/31/2016  Medication Sig  . aspirin EC 81 MG tablet Take 81 mg by mouth daily.  Marland Kitchen enoxaparin (LOVENOX) 40 MG/0.4ML injection Inject 0.4 mLs (40 mg total) into the skin daily.  . feeding supplement, ENSURE ENLIVE, (ENSURE ENLIVE) LIQD Take 237 mLs by mouth 2 (two) times daily between meals.  Marland Kitchen HYDROcodone-acetaminophen (NORCO/VICODIN) 5-325 MG tablet Take 1-2 tablets by mouth every 4 (four) hours as needed (breakthrough pain).  . Multiple Vitamins-Minerals (MULTIVITAMIN PO) Take 1 tablet by mouth daily.  . Multiple Vitamins-Minerals (PRESERVISION AREDS PO) Take 1 capsule by mouth 2 (two) times daily.  . polyvinyl alcohol (LIQUIFILM TEARS) 1.4 % ophthalmic solution Place 1 drop into both eyes as needed for dry eyes.  . Vitamin D, Ergocalciferol, (DRISDOL) 50000 UNITS CAPS capsule Take 50,000 Units by mouth every Thursday.  . [DISCONTINUED] bisoprolol-hydrochlorothiazide (ZIAC) 2.5-6.25 MG per tablet Take 1 tablet by mouth daily.  . bisoprolol-hydrochlorothiazide (ZIAC) 5-6.25 MG tablet   . [DISCONTINUED] ceFAZolin (ANCEF) 2-4 GM/100ML-% IVPB Inject 100 mLs (2 g total) into the vein every 8 (eight) hours.  . [DISCONTINUED] ceFAZolin (ANCEF) IVPB Inject 2 g into the vein every 8 (eight) hours. Indication:  Prosthetic joint infection Last Day of Therapy:  10/30/16 Labs - Once weekly:  CBC/D and BMP, Labs - Every other week:  ESR and CRP  . [DISCONTINUED] docusate sodium (COLACE) 100 MG capsule Take 1 capsule (100 mg total) by mouth 2 (two) times daily.  . [DISCONTINUED] ondansetron (ZOFRAN) 4 MG tablet Take 1 tablet (4 mg total) by  mouth every 6 (six) hours as needed for nausea.  . [DISCONTINUED] polyethylene glycol (MIRALAX / GLYCOLAX) packet Take 17 g by mouth daily as needed for mild constipation.  . [DISCONTINUED] senna (SENOKOT) 8.6 MG TABS tablet Take 1 tablet (8.6 mg total) by mouth 2 (two) times daily.   No facility-administered encounter medications on file as of 10/31/2016.      Patient Active Problem List   Diagnosis Date Noted  . Infection of total right knee replacement ([place]) 09/18/2016  . Macular degeneration 10/14/2014  . Vitamin D deficiency 10/14/2014  . HTN (hypertension) 10/14/2014  . Anemia 10/14/2014  . Obesity (BMI 30-39.9) 10/14/2014  . Arthritis 10/14/2014  . Cellulitis of right leg 03/22/2014     Health Maintenance Due  Topic Date Due  . TETANUS/TDAP  07/29/1950  . ZOSTAVAX  07/30/1991  . DEXA SCAN  07/29/1996  . PNA vac Low Risk Adult (1 of 2 - PCV13) 07/29/1996     Review of Systems  Constitutional: Negative for fever, chills, diaphoresis, activity change, appetite change, fatigue and unexpected weight change.  HENT: Negative for congestion, sore throat, rhinorrhea, sneezing, trouble swallowing and sinus pressure.  Eyes: Negative for photophobia and visual disturbance.  Respiratory: Negative for cough, chest tightness, shortness of breath, wheezing and stridor.  Cardiovascular: Negative for chest pain, palpitations and leg swelling.  Gastrointestinal: Negative for nausea, vomiting, abdominal pain, diarrhea, constipation, blood in stool, abdominal distention and anal bleeding.  Genitourinary: frequent urination. But negative for dysuria, hematuria, flank pain and difficulty urinating.  Musculoskeletal: Negative for myalgias, back pain, joint swelling, arthralgias and gait problem.  Skin: Negative for color change, pallor, rash and wound.  Neurological: Negative for dizziness, tremors, weakness and light-headedness.  Hematological: Negative for adenopathy. Does not bruise/bleed  easily.  Psychiatric/Behavioral: Negative for behavioral problems, confusion, sleep disturbance, dysphoric mood, decreased concentration and agitation.    Physical Exam   BP (!) 149/53   Pulse 68   Temp 98.7 F (37.1 C) (Oral)   Ht 4' 11.75" (1.518 m)   Wt 183 lb (83 kg)   BMI 36.04 kg/m   Physical Exam  Constitutional:  oriented to person, place, and time. appears well-developed and well-nourished. No distress.  HENT: [place]/AT, PERRLA, no scleral icterus Mouth/Throat: Oropharynx is clear and moist. No oropharyngeal exudate.  Cardiovascular: Normal rate, regular rhythm and normal heart sounds. Exam reveals no gallop and no friction rub.  No murmur heard.  Pulmonary/Chest: Effort normal and breath sounds normal. No respiratory distress.  has no wheezes.  Neck = supple, no nuchal rigidity Abdominal: Soft. Bowel sounds are normal.  exhibits no distension. There is no tenderness.  Lymphadenopathy: no cervical adenopathy. No axillary adenopathy Neurological: alert and oriented to person, place, and time.  Skin: Skin is warm and dry. No rash noted. No erythema.  Psychiatric: a normal mood and affect.  behavior is normal.   CBC Lab Results  Component Value Date   WBC 10.9 (H) 09/22/2016   RBC 2.59 (L) 09/22/2016   HGB 7.3 (L) 09/22/2016   HCT 23.0 (L) 09/22/2016   PLT 245 09/22/2016   MCV 88.8 09/22/2016   MCH 28.2 09/22/2016   MCHC 31.7 09/22/2016   RDW 14.7 09/22/2016   LYMPHSABS 2.0 05/14/2014   MONOABS 0.9 05/14/2014   EOSABS 0.2 05/14/2014    BMET Lab Results  Component Value Date   NA 135 09/21/2016   K 3.5 09/21/2016   CL 101 09/21/2016   CO2 26 09/21/2016   GLUCOSE 136 (H) 09/21/2016   BUN 17 09/21/2016   CREATININE 0.69 09/21/2016   CALCIUM 8.0 (L) 09/21/2016   GFRNONAA >60 09/21/2016   GFRAA >60 09/21/2016    Lab Results  Component Value Date   ESRSEDRATE 13 10/31/2016   Lab Results  Component Value Date   CRP 12.6 (H) 10/31/2016     Assessment  and Plan  Prosthetic joint infection =will check labs today since she has completed her IV abtx.  if her inflammatory markers are normalized, she does not need further abtx and is set for her next surgery anticipated arthrodesis.  Frequent urination = will check for ua/urine cx

## 2016-11-01 ENCOUNTER — Telehealth: Payer: Self-pay | Admitting: *Deleted

## 2016-11-01 DIAGNOSIS — A401 Sepsis due to streptococcus, group B: Secondary | ICD-10-CM | POA: Diagnosis not present

## 2016-11-01 DIAGNOSIS — D649 Anemia, unspecified: Secondary | ICD-10-CM | POA: Diagnosis not present

## 2016-11-01 LAB — C-REACTIVE PROTEIN: CRP: 12.6 mg/L — AB (ref <8.0)

## 2016-11-01 LAB — SEDIMENTATION RATE: SED RATE: 13 mm/h (ref 0–30)

## 2016-11-01 NOTE — Telephone Encounter (Signed)
Tonya RN at [facility] taking care of the patient called to ask about D/C of the PICC. Per Dr Baxter Flattery okay to D/C the PICC today. Tonya aware.

## 2016-11-05 DIAGNOSIS — E559 Vitamin D deficiency, unspecified: Secondary | ICD-10-CM | POA: Diagnosis not present

## 2016-11-05 DIAGNOSIS — I10 Essential (primary) hypertension: Secondary | ICD-10-CM | POA: Diagnosis not present

## 2016-11-05 DIAGNOSIS — Z4733 Aftercare following explantation of knee joint prosthesis: Secondary | ICD-10-CM | POA: Diagnosis not present

## 2016-11-05 DIAGNOSIS — M1990 Unspecified osteoarthritis, unspecified site: Secondary | ICD-10-CM | POA: Diagnosis not present

## 2016-11-07 DIAGNOSIS — Z4789 Encounter for other orthopedic aftercare: Secondary | ICD-10-CM | POA: Diagnosis not present

## 2016-11-07 DIAGNOSIS — T8459XD Infection and inflammatory reaction due to other internal joint prosthesis, subsequent encounter: Secondary | ICD-10-CM | POA: Diagnosis not present

## 2016-11-07 DIAGNOSIS — Z96651 Presence of right artificial knee joint: Secondary | ICD-10-CM | POA: Diagnosis not present

## 2016-11-09 DIAGNOSIS — A401 Sepsis due to streptococcus, group B: Secondary | ICD-10-CM | POA: Diagnosis not present

## 2016-11-09 DIAGNOSIS — D649 Anemia, unspecified: Secondary | ICD-10-CM | POA: Diagnosis not present

## 2016-11-14 DIAGNOSIS — Z4789 Encounter for other orthopedic aftercare: Secondary | ICD-10-CM | POA: Diagnosis not present

## 2016-11-14 DIAGNOSIS — T8459XD Infection and inflammatory reaction due to other internal joint prosthesis, subsequent encounter: Secondary | ICD-10-CM | POA: Diagnosis not present

## 2016-11-14 DIAGNOSIS — T8450XD Infection and inflammatory reaction due to unspecified internal joint prosthesis, subsequent encounter: Secondary | ICD-10-CM | POA: Diagnosis not present

## 2016-11-14 DIAGNOSIS — Z96651 Presence of right artificial knee joint: Secondary | ICD-10-CM | POA: Diagnosis not present

## 2016-11-14 DIAGNOSIS — Z96659 Presence of unspecified artificial knee joint: Secondary | ICD-10-CM | POA: Diagnosis not present

## 2016-11-21 ENCOUNTER — Ambulatory Visit: Payer: Self-pay | Admitting: Orthopedic Surgery

## 2016-11-23 ENCOUNTER — Encounter (HOSPITAL_COMMUNITY): Payer: Self-pay | Admitting: *Deleted

## 2016-11-23 ENCOUNTER — Ambulatory Visit: Payer: Self-pay | Admitting: Orthopedic Surgery

## 2016-11-23 MED ORDER — ONDANSETRON HCL 4 MG/2ML IJ SOLN: 4.0000 mg | Freq: Once | INTRAMUSCULAR | Status: DC | PRN

## 2016-11-23 MED ORDER — OXYCODONE HCL 5 MG PO TABS: 5.0000 mg | ORAL_TABLET | Freq: Once | ORAL | Status: DC | PRN

## 2016-11-23 MED ORDER — FENTANYL CITRATE (PF) 100 MCG/2ML IJ SOLN: 25.0000 ug | INTRAMUSCULAR | Status: DC | PRN

## 2016-11-23 MED ORDER — TRANEXAMIC ACID 1000 MG/10ML IV SOLN
1000.0000 mg | INTRAVENOUS | Status: DC
  Filled 2016-11-23 (×2): qty 10

## 2016-11-23 MED ORDER — VANCOMYCIN HCL 10 G IV SOLR
1500.0000 mg | INTRAVENOUS | Status: DC
  Filled 2016-11-23 (×2): qty 1500

## 2016-11-23 MED ORDER — OXYCODONE HCL 5 MG/5ML PO SOLN: 5.0000 mg | Freq: Once | ORAL | Status: DC | PRN

## 2016-11-23 NOTE — H&P (Signed)
TOTAL KNEE REVISION ADMISSION H&P  Patient is being admitted for right revision total knee arthroplasty.  Subjective:  Chief Complaint:right knee pain.  HPI: Dawn Lawson, 83 y.o. female, has a history of pain and functional disability in the right knee(s) due to failed previous arthroplasty and patient has failed non-surgical conservative treatments for greater than 12 weeks to include antibiotic spacer and IV abx. The indications for the revision of the total knee arthroplasty are history of total knee infection. Onset of symptoms was gradual starting 2 years ago with gradually worsening course since that time.  Prior procedures on the right knee(s) include arthroplasty.  This condition presents safety issues increasing the risk of falls. This patient has had resection TKA.  There is no current active infection.  Patient Active Problem List   Diagnosis Date Noted  . Infection of total right knee replacement ([place]) 09/18/2016  . Macular degeneration 10/14/2014  . Vitamin D deficiency 10/14/2014  . HTN (hypertension) 10/14/2014  . Anemia 10/14/2014  . Obesity (BMI 30-39.9) 10/14/2014  . Arthritis 10/14/2014  . Cellulitis of right leg 03/22/2014   Past Medical History:  Diagnosis Date  . AKI (acute kidney injury) ([place]) 03/22/2014  . Arthritis   . ATN (acute tubular necrosis) ([place]) 03/22/2014  . Cataract   . Heart murmur   . Hypertension   . Lactic acidosis 03/22/2014  . PONV (postoperative nausea and vomiting)   . Sepsis due to group B Streptococcus ([place]) 04/14/2014  . Severe sepsis with acute organ dysfunction ([place]) 03/22/2014  . Status post PICC central line placement 04/14/2014  . Urinary tract infection 03/22/2014    Past Surgical History:  Procedure Laterality Date  . ABDOMINAL HYSTERECTOMY     partial  . APPENDECTOMY    . APPLICATION OF WOUND VAC Right 09/18/2016   Procedure: APPLICATION OF WOUND VAC;  Surgeon: Rod Can, MD;  Location: [facility];  Service: Orthopedics;   Laterality: Right;  . EXCISIONAL TOTAL KNEE ARTHROPLASTY WITH ANTIBIOTIC SPACERS Right 09/18/2016   Procedure: RESECTION OF RIGHT TOTAL KNEE ARTHROPLASTY PLACEMENT OF ANTIBIOTIC SPACER;  Surgeon: Rod Can, MD;  Location: [place];  Service: Orthopedics;  Laterality: Right;  . EYE SURGERY    . JOINT REPLACEMENT Right    KNEE  . TEE WITHOUT CARDIOVERSION N/A 04/09/2014   Procedure: TRANSESOPHAGEAL ECHOCARDIOGRAM (TEE);  Surgeon: Lelon Perla, MD;  Location: [facility] ENDOSCOPY;  Service: Cardiovascular;  Laterality: N/A;     (Not in a hospital admission) Allergies  Allergen Reactions  . Penicillins Hives and Itching    Tolerated Ancef Nov 2017.  TDD  Has patient had a PCN reaction causing immediate rash, facial/tongue/throat swelling, SOB or lightheadedness with hypotension: NO Has patient had a PCN reaction causing severe rash involving mucus membranes or skin necrosis: {NO Has patient had a PCN reaction that required hospitalization {NO Has patient had a PCN reaction occurring within the last 10 years:  # # YES # #  If all of the above answers are "NO", then may proceed with Cephalosporin use.  . Ampicillin Hives and Itching    Social History  Substance Use Topics  . Smoking status: Never Smoker  . Smokeless tobacco: Never Used  . Alcohol use No    Family History  Problem Relation Age of Onset  . Heart disease Mother   . Cancer Mother   . Heart disease Father       Review of Systems  Constitutional: Negative.   HENT: Negative.   Eyes: Negative.  Respiratory: Negative.   Cardiovascular: Negative.   Gastrointestinal: Negative.   Musculoskeletal: Positive for joint pain.  Skin: Negative.   Neurological: Negative.   Endo/Heme/Allergies: Negative.   Psychiatric/Behavioral: Negative.      Objective:  Physical Exam  Vital signs in last 24 hours: @VSRANGES @  Labs:  Estimated body mass index is 36.04 kg/m as calculated from the following:   Height as of 10/31/16: 4'  11.75" (1.518 m).   Weight as of 10/31/16: 83 kg (183 lb).  Imaging Review Plain radiographs demonstrate static spacer  Assessment/Plan:  Status post resection right TKA with retained spacer  The patient history, physical examination, clinical judgment of the provider and imaging studies are consistent with end stage degenerative joint disease of the right knee(s), previous total knee arthroplasty. Right knee arthrodesis is deemed medically necessary. The treatment options including medical management, injection therapy, arthroscopy and revision arthroplasty were discussed at length. The risks and benefits of revision total knee arthroplasty were presented and reviewed. The risks due to aseptic loosening, infection, stiffness, patella tracking problems, thromboembolic complications and other imponderables were discussed. The patient acknowledged the explanation, agreed to proceed with the plan and consent was signed. Patient is being admitted for inpatient treatment for surgery, pain control, PT, OT, prophylactic antibiotics, VTE prophylaxis, progressive ambulation and ADL's and discharge planning.The patient is planning to be discharged home with home health services

## 2016-11-23 NOTE — Pre-Procedure Instructions (Signed)
    TENEIKA GOLDBLATT  11/23/2016      Your procedure is scheduled on Friday,  February 2.  Report to [facility] Admitting at 5:30 AM .             Your surgery or procedure is scheduled for 7:30 AM   Call this number if you have problems the morning of surgery: [phone]                    Please send Medication Record with medications given documented.  Remember:  Do not eat food or drink liquids after midnight.  Take these medicines the morning of surgery with A SIP OF WATER :bisoprolol-hydrochlorothiazide (Ziac).  May use eye drops.                Aspirin and enoxaparin (LOVENOX) as instructed by Dr Lubertha South.   Do not wear jewelry, make-up or nail polish.  Do not wear lotions, powders, or perfumes, or deodorant.  Do not shave 48 hours prior to surgery.   Do not bring valuables to the hospital.  [facility] is not responsible for any belongings or valuables.  Contacts, dentures or bridgework may not be worn into surgery.  Leave your suitcase in the car.  After surgery it may be brought to your room.

## 2016-11-23 NOTE — Pre-Procedure Instructions (Signed)
    Dawn Lawson  11/23/2016      Your procedure is scheduled on Friday,  February 2.  Report to [facility] Admitting at 5:30 AM .             Your surgery or procedure is scheduled for 7:30 AM   Call this number if you have problems the morning of surgery: [phone]   Remember:  Do not eat food or drink liquids after midnight.  Take these medicines the morning of surgery with A SIP OF WATER :bisoprolol-hydrochlorothiazide (Ziac).  May use eye drops.                Aspirin and enoxaparin (LOVENOX) as instructed by Dr Lubertha South.   Do not wear jewelry, make-up or nail polish.  Do not wear lotions, powders, or perfumes, or deodorant.  Do not shave 48 hours prior to surgery.   Do not bring valuables to the hospital.  [facility] is not responsible for any belongings or valuables.  Contacts, dentures or bridgework may not be worn into surgery.  Leave your suitcase in the car.  After surgery it may be brought to your room.

## 2016-11-24 ENCOUNTER — Inpatient Hospital Stay (HOSPITAL_COMMUNITY)
Admission: RE | Admit: 2016-11-24 | Discharge: 2016-11-28 | DRG: 467 | Disposition: A | Discharge status: Skilled Nursing Facility | Payer: Medicare Other | Source: Ambulatory Visit | Attending: Orthopedic Surgery | Admitting: Orthopedic Surgery

## 2016-11-24 ENCOUNTER — Inpatient Hospital Stay (HOSPITAL_COMMUNITY): Payer: Medicare Other

## 2016-11-24 ENCOUNTER — Inpatient Hospital Stay (HOSPITAL_COMMUNITY): Payer: Medicare Other | Admitting: Certified Registered"

## 2016-11-24 ENCOUNTER — Encounter (HOSPITAL_COMMUNITY): Payer: Self-pay | Admitting: Certified Registered"

## 2016-11-24 ENCOUNTER — Encounter (HOSPITAL_COMMUNITY)
Admission: RE | Disposition: A | Discharge status: Skilled Nursing Facility | Payer: Self-pay | Source: Ambulatory Visit | Attending: Orthopedic Surgery

## 2016-11-24 DIAGNOSIS — Z880 Allergy status to penicillin: Secondary | ICD-10-CM

## 2016-11-24 DIAGNOSIS — Y831 Surgical operation with implant of artificial internal device as the cause of abnormal reaction of the patient, or of later complication, without mention of misadventure at the time of the procedure: Secondary | ICD-10-CM | POA: Diagnosis present

## 2016-11-24 DIAGNOSIS — M25661 Stiffness of right knee, not elsewhere classified: Secondary | ICD-10-CM | POA: Diagnosis not present

## 2016-11-24 DIAGNOSIS — R7303 Prediabetes: Secondary | ICD-10-CM | POA: Diagnosis present

## 2016-11-24 DIAGNOSIS — M25569 Pain in unspecified knee: Secondary | ICD-10-CM | POA: Diagnosis not present

## 2016-11-24 DIAGNOSIS — T8579XA Infection and inflammatory reaction due to other internal prosthetic devices, implants and grafts, initial encounter: Secondary | ICD-10-CM | POA: Diagnosis not present

## 2016-11-24 DIAGNOSIS — I10 Essential (primary) hypertension: Secondary | ICD-10-CM | POA: Diagnosis present

## 2016-11-24 DIAGNOSIS — T8453XA Infection and inflammatory reaction due to internal right knee prosthesis, initial encounter: Secondary | ICD-10-CM | POA: Diagnosis not present

## 2016-11-24 DIAGNOSIS — Z4733 Aftercare following explantation of knee joint prosthesis: Secondary | ICD-10-CM | POA: Diagnosis not present

## 2016-11-24 DIAGNOSIS — Z09 Encounter for follow-up examination after completed treatment for conditions other than malignant neoplasm: Secondary | ICD-10-CM

## 2016-11-24 DIAGNOSIS — T84092A Other mechanical complication of internal right knee prosthesis, initial encounter: Secondary | ICD-10-CM | POA: Diagnosis not present

## 2016-11-24 DIAGNOSIS — D62 Acute posthemorrhagic anemia: Secondary | ICD-10-CM | POA: Diagnosis not present

## 2016-11-24 DIAGNOSIS — Z87440 Personal history of urinary (tract) infections: Secondary | ICD-10-CM | POA: Diagnosis not present

## 2016-11-24 DIAGNOSIS — M25561 Pain in right knee: Secondary | ICD-10-CM | POA: Diagnosis not present

## 2016-11-24 DIAGNOSIS — B999 Unspecified infectious disease: Secondary | ICD-10-CM | POA: Diagnosis not present

## 2016-11-24 DIAGNOSIS — M1990 Unspecified osteoarthritis, unspecified site: Secondary | ICD-10-CM | POA: Diagnosis not present

## 2016-11-24 DIAGNOSIS — T8450XD Infection and inflammatory reaction due to unspecified internal joint prosthesis, subsequent encounter: Secondary | ICD-10-CM | POA: Diagnosis not present

## 2016-11-24 DIAGNOSIS — E669 Obesity, unspecified: Secondary | ICD-10-CM | POA: Diagnosis not present

## 2016-11-24 DIAGNOSIS — E559 Vitamin D deficiency, unspecified: Secondary | ICD-10-CM | POA: Diagnosis not present

## 2016-11-24 DIAGNOSIS — R262 Difficulty in walking, not elsewhere classified: Secondary | ICD-10-CM

## 2016-11-24 DIAGNOSIS — M25361 Other instability, right knee: Secondary | ICD-10-CM | POA: Diagnosis not present

## 2016-11-24 DIAGNOSIS — D649 Anemia, unspecified: Secondary | ICD-10-CM | POA: Diagnosis not present

## 2016-11-24 HISTORY — DX: Prediabetes: R73.03

## 2016-11-24 HISTORY — PX: TOTAL KNEE ARTHROPLASTY WITH REVISION COMPONENTS: SHX6198

## 2016-11-24 HISTORY — PX: KNEE HARDWARE REMOVAL: SUR1128

## 2016-11-24 HISTORY — DX: Anemia, unspecified: D64.9

## 2016-11-24 LAB — BASIC METABOLIC PANEL
ANION GAP: 12 (ref 5–15)
BUN: 20 mg/dL (ref 6–20)
CALCIUM: 9.2 mg/dL (ref 8.9–10.3)
CHLORIDE: 101 mmol/L (ref 101–111)
CO2: 23 mmol/L (ref 22–32)
Creatinine, Ser: 0.75 mg/dL (ref 0.44–1.00)
GFR calc Af Amer: >60 mL/min (ref >60)
GFR calc non Af Amer: >60 mL/min (ref >60)
Glucose, Bld: 115 mg/dL — ABNORMAL HIGH (ref 65–99)
Potassium: 4 mmol/L (ref 3.5–5.1)
Sodium: 136 mmol/L (ref 135–145)

## 2016-11-24 LAB — CREATININE, SERUM
Creatinine, Ser: 0.76 mg/dL (ref 0.44–1.00)
GFR calc Af Amer: >60 mL/min (ref >60)
GFR calc non Af Amer: >60 mL/min (ref >60)

## 2016-11-24 LAB — CBC
HCT: 32 % — ABNORMAL LOW (ref 36.0–46.0)
HCT: 29.8 % — ABNORMAL LOW (ref 36.0–46.0)
HEMOGLOBIN: 9.8 g/dL — AB (ref 12.0–15.0)
Hemoglobin: 9.5 g/dL — ABNORMAL LOW (ref 12.0–15.0)
MCH: 25.7 pg — ABNORMAL LOW (ref 26.0–34.0)
MCH: 26.5 pg (ref 26.0–34.0)
MCHC: 30.6 g/dL (ref 30.0–36.0)
MCHC: 31.9 g/dL (ref 30.0–36.0)
MCV: 84 fL (ref 78.0–100.0)
MCV: 83.2 fL (ref 78.0–100.0)
PLATELETS: 285 10*3/uL (ref 150–400)
Platelets: 361 10*3/uL (ref 150–400)
RBC: 3.81 MIL/uL — ABNORMAL LOW (ref 3.87–5.11)
RBC: 3.58 MIL/uL — ABNORMAL LOW (ref 3.87–5.11)
RDW: 16.1 % — AB (ref 11.5–15.5)
RDW: 15.8 % — AB (ref 11.5–15.5)
WBC: 8.8 10*3/uL (ref 4.0–10.5)
WBC: 18.6 10*3/uL — ABNORMAL HIGH (ref 4.0–10.5)

## 2016-11-24 LAB — TYPE AND SCREEN
ABO/RH(D): O POS
Antibody Screen: NEGATIVE

## 2016-11-24 SURGERY — TOTAL KNEE ARTHROPLASTY WITH REVISION COMPONENTS
Anesthesia: General | Laterality: Right

## 2016-11-24 MED ORDER — METOCLOPRAMIDE HCL 5 MG PO TABS: 5.0000 mg | ORAL_TABLET | Freq: Three times a day (TID) | ORAL | Status: DC | PRN

## 2016-11-24 MED ORDER — SODIUM CHLORIDE 0.9 % IV SOLN
INTRAVENOUS | Status: DC
  Administered 2016-11-24 – 2016-11-26 (×3): via INTRAVENOUS

## 2016-11-24 MED ORDER — LIDOCAINE 2% (20 MG/ML) 5 ML SYRINGE
INTRAMUSCULAR | Status: AC
  Filled 2016-11-24: qty 5

## 2016-11-24 MED ORDER — BUPIVACAINE HCL (PF) 0.5 % IJ SOLN
INTRAMUSCULAR | Status: AC
  Filled 2016-11-24: qty 30

## 2016-11-24 MED ORDER — VANCOMYCIN HCL IN DEXTROSE 1-5 GM/200ML-% IV SOLN
INTRAVENOUS | Status: AC
  Filled 2016-11-24: qty 400

## 2016-11-24 MED ORDER — KETOROLAC TROMETHAMINE 30 MG/ML IJ SOLN
INTRAMUSCULAR | Status: DC | PRN
  Administered 2016-11-24: 30 mg via INTRAVENOUS

## 2016-11-24 MED ORDER — FENTANYL CITRATE (PF) 100 MCG/2ML IJ SOLN: 25.0000 ug | INTRAMUSCULAR | Status: DC | PRN

## 2016-11-24 MED ORDER — KETOROLAC TROMETHAMINE 30 MG/ML IJ SOLN
INTRAMUSCULAR | Status: AC
  Filled 2016-11-24: qty 1

## 2016-11-24 MED ORDER — BUPIVACAINE HCL (PF) 0.5 % IJ SOLN
INTRAMUSCULAR | Status: DC | PRN
  Administered 2016-11-24: 30 mL

## 2016-11-24 MED ORDER — ENSURE ENLIVE PO LIQD
237.0000 mL | Freq: Two times a day (BID) | ORAL | Status: DC
  Administered 2016-11-26 – 2016-11-27 (×3): 237 mL via ORAL

## 2016-11-24 MED ORDER — PHENYLEPHRINE 40 MCG/ML (10ML) SYRINGE FOR IV PUSH (FOR BLOOD PRESSURE SUPPORT)
PREFILLED_SYRINGE | INTRAVENOUS | Status: AC
  Filled 2016-11-24: qty 10

## 2016-11-24 MED ORDER — ONDANSETRON HCL 4 MG PO TABS: 4.0000 mg | ORAL_TABLET | Freq: Four times a day (QID) | ORAL | Status: DC | PRN

## 2016-11-24 MED ORDER — VANCOMYCIN HCL IN DEXTROSE 1-5 GM/200ML-% IV SOLN
INTRAVENOUS | Status: AC
  Filled 2016-11-24: qty 200

## 2016-11-24 MED ORDER — SODIUM CHLORIDE 0.9 % IJ SOLN
INTRAMUSCULAR | Status: DC | PRN
  Administered 2016-11-24: 30 mL via INTRAVENOUS

## 2016-11-24 MED ORDER — POLYVINYL ALCOHOL 1.4 % OP SOLN
1.0000 [drp] | OPHTHALMIC | Status: DC | PRN
  Filled 2016-11-24: qty 15

## 2016-11-24 MED ORDER — POLYETHYLENE GLYCOL 3350 17 G PO PACK: 17.0000 g | PACK | Freq: Every day | ORAL | Status: DC | PRN

## 2016-11-24 MED ORDER — ENSURE PO LIQD: 237.0000 mL | Freq: Two times a day (BID) | ORAL | Status: DC

## 2016-11-24 MED ORDER — ONDANSETRON HCL 4 MG/2ML IJ SOLN
INTRAMUSCULAR | Status: AC
  Filled 2016-11-24: qty 2

## 2016-11-24 MED ORDER — SENNA 8.6 MG PO TABS
2.0000 | ORAL_TABLET | Freq: Every day | ORAL | Status: DC
  Administered 2016-11-24: 17.2 mg via ORAL
  Filled 2016-11-24: qty 2

## 2016-11-24 MED ORDER — ROCURONIUM BROMIDE 50 MG/5ML IV SOSY
PREFILLED_SYRINGE | INTRAVENOUS | Status: AC
  Filled 2016-11-24: qty 5

## 2016-11-24 MED ORDER — CEFAZOLIN SODIUM-DEXTROSE 2-4 GM/100ML-% IV SOLN
2.0000 g | Freq: Three times a day (TID) | INTRAVENOUS | Status: DC
  Administered 2016-11-24 – 2016-11-26 (×7): 2 g via INTRAVENOUS
  Filled 2016-11-24 (×9): qty 100

## 2016-11-24 MED ORDER — ONDANSETRON HCL 4 MG/2ML IJ SOLN
4.0000 mg | Freq: Four times a day (QID) | INTRAMUSCULAR | Status: DC | PRN
  Administered 2016-11-24: 4 mg via INTRAVENOUS
  Filled 2016-11-24: qty 2

## 2016-11-24 MED ORDER — METOCLOPRAMIDE HCL 5 MG/ML IJ SOLN: 5.0000 mg | Freq: Three times a day (TID) | INTRAMUSCULAR | Status: DC | PRN

## 2016-11-24 MED ORDER — EPINEPHRINE PF 1 MG/ML IJ SOLN
INTRAMUSCULAR | Status: AC
  Filled 2016-11-24: qty 1

## 2016-11-24 MED ORDER — FENTANYL CITRATE (PF) 100 MCG/2ML IJ SOLN
INTRAMUSCULAR | Status: AC
  Filled 2016-11-24: qty 2

## 2016-11-24 MED ORDER — HYDROMORPHONE HCL 2 MG/ML IJ SOLN: 0.5000 mg | INTRAMUSCULAR | Status: DC | PRN

## 2016-11-24 MED ORDER — FENTANYL CITRATE (PF) 100 MCG/2ML IJ SOLN
25.0000 ug | INTRAMUSCULAR | Status: DC | PRN
  Administered 2016-11-24: 25 ug via INTRAVENOUS

## 2016-11-24 MED ORDER — METHOCARBAMOL 500 MG PO TABS
500.0000 mg | ORAL_TABLET | Freq: Four times a day (QID) | ORAL | Status: DC | PRN
  Administered 2016-11-24 – 2016-11-27 (×4): 500 mg via ORAL
  Filled 2016-11-24 (×4): qty 1

## 2016-11-24 MED ORDER — VITAMIN D (ERGOCALCIFEROL) 1.25 MG (50000 UNIT) PO CAPS
50000.0000 [IU] | ORAL_CAPSULE | ORAL | Status: DC
  Administered 2016-11-25: 50000 [IU] via ORAL
  Filled 2016-11-24: qty 1

## 2016-11-24 MED ORDER — ENOXAPARIN SODIUM 30 MG/0.3ML ~~LOC~~ SOLN
30.0000 mg | Freq: Two times a day (BID) | SUBCUTANEOUS | Status: DC
  Administered 2016-11-25 – 2016-11-28 (×7): 30 mg via SUBCUTANEOUS
  Filled 2016-11-24 (×7): qty 0.3

## 2016-11-24 MED ORDER — PHENYLEPHRINE HCL 10 MG/ML IJ SOLN
INTRAMUSCULAR | Status: DC | PRN
  Administered 2016-11-24: 25 ug/min via INTRAVENOUS

## 2016-11-24 MED ORDER — PROPOFOL 10 MG/ML IV BOLUS
INTRAVENOUS | Status: DC | PRN
  Administered 2016-11-24: 20 mg via INTRAVENOUS
  Administered 2016-11-24 (×2): 30 mg via INTRAVENOUS
  Administered 2016-11-24: 120 mg via INTRAVENOUS

## 2016-11-24 MED ORDER — ROCURONIUM BROMIDE 100 MG/10ML IV SOLN
INTRAVENOUS | Status: DC | PRN
  Administered 2016-11-24: 20 mg via INTRAVENOUS
  Administered 2016-11-24: 50 mg via INTRAVENOUS

## 2016-11-24 MED ORDER — KETOROLAC TROMETHAMINE 15 MG/ML IJ SOLN
7.5000 mg | Freq: Four times a day (QID) | INTRAMUSCULAR | Status: AC
  Administered 2016-11-24 – 2016-11-25 (×2): 7.5 mg via INTRAVENOUS
  Filled 2016-11-24 (×2): qty 1

## 2016-11-24 MED ORDER — VANCOMYCIN HCL 10 G IV SOLR
1500.0000 mg | INTRAVENOUS | Status: AC
  Administered 2016-11-24: 1500 mg via INTRAVENOUS
  Filled 2016-11-24: qty 1500

## 2016-11-24 MED ORDER — ONDANSETRON HCL 4 MG/2ML IJ SOLN
INTRAMUSCULAR | Status: DC | PRN
  Administered 2016-11-24: 4 mg via INTRAVENOUS

## 2016-11-24 MED ORDER — PHENOL 1.4 % MT LIQD: 1.0000 | OROMUCOSAL | Status: DC | PRN

## 2016-11-24 MED ORDER — LIDOCAINE HCL (CARDIAC) 20 MG/ML IV SOLN
INTRAVENOUS | Status: DC | PRN
  Administered 2016-11-24: 80 mg via INTRAVENOUS

## 2016-11-24 MED ORDER — ALUM & MAG HYDROXIDE-SIMETH 200-200-20 MG/5ML PO SUSP: 30.0000 mL | ORAL | Status: DC | PRN

## 2016-11-24 MED ORDER — ACETAMINOPHEN 325 MG PO TABS
650.0000 mg | ORAL_TABLET | Freq: Four times a day (QID) | ORAL | Status: DC | PRN
  Administered 2016-11-25 – 2016-11-28 (×3): 650 mg via ORAL
  Filled 2016-11-24 (×3): qty 2

## 2016-11-24 MED ORDER — DIPHENHYDRAMINE HCL 12.5 MG/5ML PO ELIX: 12.5000 mg | ORAL_SOLUTION | ORAL | Status: DC | PRN

## 2016-11-24 MED ORDER — GLYCOPYRROLATE 0.2 MG/ML IJ SOLN
INTRAMUSCULAR | Status: DC | PRN
  Administered 2016-11-24: 0.4 mg via INTRAVENOUS

## 2016-11-24 MED ORDER — PHENYLEPHRINE HCL 10 MG/ML IJ SOLN
INTRAMUSCULAR | Status: DC | PRN
  Administered 2016-11-24: 200 ug via INTRAVENOUS
  Administered 2016-11-24: 40 ug via INTRAVENOUS

## 2016-11-24 MED ORDER — ACETAMINOPHEN 650 MG RE SUPP: 650.0000 mg | Freq: Four times a day (QID) | RECTAL | Status: DC | PRN

## 2016-11-24 MED ORDER — BISOPROLOL FUMARATE 5 MG PO TABS
2.5000 mg | ORAL_TABLET | Freq: Every day | ORAL | Status: DC
  Administered 2016-11-25 – 2016-11-28 (×4): 2.5 mg via ORAL
  Filled 2016-11-24 (×5): qty 0.5

## 2016-11-24 MED ORDER — POVIDONE-IODINE 10 % EX SWAB: 2.0000 "application " | Freq: Once | CUTANEOUS | Status: DC

## 2016-11-24 MED ORDER — DEXAMETHASONE SODIUM PHOSPHATE 10 MG/ML IJ SOLN
10.0000 mg | Freq: Once | INTRAMUSCULAR | Status: AC
  Administered 2016-11-25: 10 mg via INTRAVENOUS
  Filled 2016-11-24: qty 1

## 2016-11-24 MED ORDER — HYDROCHLOROTHIAZIDE 10 MG/ML ORAL SUSPENSION
6.2500 mg | Freq: Every day | ORAL | Status: DC
  Administered 2016-11-26 – 2016-11-28 (×3): 6.25 mg via ORAL
  Filled 2016-11-24 (×4): qty 1.25

## 2016-11-24 MED ORDER — PRESERVISION AREDS PO CAPS: ORAL_CAPSULE | Freq: Two times a day (BID) | ORAL | Status: DC

## 2016-11-24 MED ORDER — BISOPROLOL-HYDROCHLOROTHIAZIDE 2.5-6.25 MG PO TABS: 1.0000 | ORAL_TABLET | Freq: Every day | ORAL | Status: DC

## 2016-11-24 MED ORDER — VANCOMYCIN HCL 1000 MG IV SOLR
7000.0000 mg | INTRAVENOUS | Status: AC
  Administered 2016-11-24: 7 g
  Filled 2016-11-24: qty 7000

## 2016-11-24 MED ORDER — SODIUM CHLORIDE 0.9 % IV SOLN
INTRAVENOUS | Status: DC | PRN
  Administered 2016-11-24: 1000 mg via INTRAVENOUS

## 2016-11-24 MED ORDER — HYDROCODONE-ACETAMINOPHEN 5-325 MG PO TABS
1.0000 | ORAL_TABLET | ORAL | Status: DC | PRN
  Administered 2016-11-24 – 2016-11-26 (×5): 2 via ORAL
  Administered 2016-11-27 (×3): 1 via ORAL
  Filled 2016-11-24: qty 1
  Filled 2016-11-24 (×2): qty 2
  Filled 2016-11-24: qty 1
  Filled 2016-11-24 (×4): qty 2

## 2016-11-24 MED ORDER — METHOCARBAMOL 1000 MG/10ML IJ SOLN
500.0000 mg | Freq: Four times a day (QID) | INTRAVENOUS | Status: DC | PRN
  Filled 2016-11-24: qty 5

## 2016-11-24 MED ORDER — LACTATED RINGERS IV SOLN
INTRAVENOUS | Status: DC | PRN
  Administered 2016-11-24 (×2): via INTRAVENOUS

## 2016-11-24 MED ORDER — PROPOFOL 10 MG/ML IV BOLUS
INTRAVENOUS | Status: AC
  Filled 2016-11-24: qty 20

## 2016-11-24 MED ORDER — MENTHOL 3 MG MT LOZG: 1.0000 | LOZENGE | OROMUCOSAL | Status: DC | PRN

## 2016-11-24 MED ORDER — DOCUSATE SODIUM 100 MG PO CAPS
100.0000 mg | ORAL_CAPSULE | Freq: Two times a day (BID) | ORAL | Status: DC
  Administered 2016-11-24 (×2): 100 mg via ORAL
  Filled 2016-11-24 (×6): qty 1

## 2016-11-24 MED ORDER — FENTANYL CITRATE (PF) 100 MCG/2ML IJ SOLN
INTRAMUSCULAR | Status: DC | PRN
  Administered 2016-11-24: 25 ug via INTRAVENOUS
  Administered 2016-11-24 (×3): 50 ug via INTRAVENOUS
  Administered 2016-11-24: 25 ug via INTRAVENOUS
  Administered 2016-11-24: 50 ug via INTRAVENOUS
  Administered 2016-11-24: 25 ug via INTRAVENOUS
  Administered 2016-11-24 (×2): 50 ug via INTRAVENOUS

## 2016-11-24 MED ORDER — CHLORHEXIDINE GLUCONATE 4 % EX LIQD: 60.0000 mL | Freq: Once | CUTANEOUS | Status: DC

## 2016-11-24 MED ORDER — SODIUM CHLORIDE 0.9 % IV SOLN: INTRAVENOUS | Status: DC

## 2016-11-24 MED ORDER — TRANEXAMIC ACID 1000 MG/10ML IV SOLN
1000.0000 mg | Freq: Once | INTRAVENOUS | Status: AC
  Administered 2016-11-24: 1000 mg via INTRAVENOUS
  Filled 2016-11-24: qty 10

## 2016-11-24 SURGICAL SUPPLY — 61 items
ALCOHOL ISOPROPYL (RUBBING) (MISCELLANEOUS) IMPLANT
BANDAGE ACE 6X5 VEL STRL LF (GAUZE/BANDAGES/DRESSINGS) ×3 IMPLANT
BLADE SAW RECIP 87.9 MT (BLADE) ×3 IMPLANT
BNDG CMPR MED 10X6 ELC LF (GAUZE/BANDAGES/DRESSINGS) ×1
BNDG ELASTIC 6X10 VLCR STRL LF (GAUZE/BANDAGES/DRESSINGS) ×3 IMPLANT
BONE CEMENT PALACOS R-G (Cement) ×18 IMPLANT
CEMENT BONE PALACOS R-G (Cement) ×6 IMPLANT
CEMENT RESTRICTOR BONE PREP ST (KITS) ×2 IMPLANT
CHLORAPREP W/TINT 26ML (MISCELLANEOUS) ×6 IMPLANT
CUFF TOURNIQUET SINGLE 34IN LL (TOURNIQUET CUFF) ×3 IMPLANT
DERMABOND ADVANCED (GAUZE/BANDAGES/DRESSINGS)
DERMABOND ADVANCED .7 DNX12 (GAUZE/BANDAGES/DRESSINGS) IMPLANT
DRAIN HEMOVAC 7FR (DRAIN) IMPLANT
DRAPE EXTREMITY T 121X128X90 (DRAPE) ×3 IMPLANT
DRAPE SHEET LG 3/4 BI-LAMINATE (DRAPES) ×6 IMPLANT
DRAPE U-SHAPE 47X51 STRL (DRAPES) ×3 IMPLANT
DRAPE UNIVERSAL PACK (DRAPES) ×3 IMPLANT
DRSG AQUACEL AG ADV 3.5X14 (GAUZE/BANDAGES/DRESSINGS) IMPLANT
DRSG TEGADERM 2-3/8X2-3/4 SM (GAUZE/BANDAGES/DRESSINGS) ×3 IMPLANT
ELECT REM PT RETURN 9FT ADLT (ELECTROSURGICAL) ×3
ELECTRODE REM PT RTRN 9FT ADLT (ELECTROSURGICAL) ×1 IMPLANT
EVACUATOR 1/8 PVC DRAIN (DRAIN) IMPLANT
GLOVE BIO SURGEON STRL SZ8.5 (GLOVE) ×6 IMPLANT
GLOVE BIOGEL PI IND STRL 8.5 (GLOVE) ×1 IMPLANT
GLOVE BIOGEL PI INDICATOR 8.5 (GLOVE) ×2
GOWN STRL REUS W/TWL 2XL LVL3 (GOWN DISPOSABLE) ×3 IMPLANT
HANDPIECE INTERPULSE COAX TIP (DISPOSABLE) ×3
KIT BASIN OR (CUSTOM PROCEDURE TRAY) ×3 IMPLANT
KIT BONE PREP STRYKER (KITS) ×1
KIT STIMULAN RAPID CURE  10CC (Orthopedic Implant) ×2 IMPLANT
KIT STIMULAN RAPID CURE 10CC (Orthopedic Implant) ×1 IMPLANT
MANIFOLD NEPTUNE II (INSTRUMENTS) ×3 IMPLANT
NAIL ATHRODESIS W/LOCK BOLTS 7 (Nail) ×3 IMPLANT
NAIL ATHRODESIS W/LOCK SCREW (Nail) ×6 IMPLANT
NEEDLE SPNL 18GX3.5 QUINCKE PK (NEEDLE) ×6 IMPLANT
PACK TOTAL JOINT (CUSTOM PROCEDURE TRAY) ×3 IMPLANT
PACK TOTAL KNEE CUSTOM (KITS) ×3 IMPLANT
PADDING CAST COTTON 6X4 STRL (CAST SUPPLIES) ×3 IMPLANT
PIN STEINMANN THREADED TIP (PIN) ×3 IMPLANT
PRESSURIZER FEMORAL UNIV (MISCELLANEOUS) ×3 IMPLANT
PREVENA INCISION MGT 90 150 (MISCELLANEOUS) ×3 IMPLANT
SAW OSC TIP CART 19.5X105X1.3 (SAW) ×3 IMPLANT
SEALER BIPOLAR AQUA 6.0 (INSTRUMENTS) IMPLANT
SET HNDPC FAN SPRY TIP SCT (DISPOSABLE) ×1 IMPLANT
SET PAD KNEE POSITIONER (MISCELLANEOUS) ×3 IMPLANT
STEM EXT BOWED W/SCREW 13X50 (Stem) ×2 IMPLANT
STEM OSS STRT SHLD 11X150 (Screw) ×3 IMPLANT
SUT ETHILON 2 0 PSLX (SUTURE) ×6 IMPLANT
SUT MNCRL AB 3-0 PS2 27 (SUTURE) ×3 IMPLANT
SUT MON AB 2-0 CT1 36 (SUTURE) ×9 IMPLANT
SUT PDS AB 1 CTX 36 (SUTURE) ×6 IMPLANT
SUT STRATAFIX 0 PDS 27 VIOLET (SUTURE) ×9
SUT VIC AB 1 CTX 27 (SUTURE) ×3 IMPLANT
SUT VIC AB 2-0 CT1 27 (SUTURE) ×2
SUT VIC AB 2-0 CT1 TAPERPNT 27 (SUTURE) ×1 IMPLANT
SUT VLOC 180 0 24IN GS25 (SUTURE) ×3 IMPLANT
SUTURE STRATFX 0 PDS 27 VIOLET (SUTURE) ×3 IMPLANT
SYR 50ML LL SCALE MARK (SYRINGE) ×6 IMPLANT
TOWER CARTRIDGE SMART MIX (DISPOSABLE) ×3 IMPLANT
TRAY CATH 16FR W/PLASTIC CATH (SET/KITS/TRAYS/PACK) IMPLANT
WRAP KNEE MAXI GEL POST OP (GAUZE/BANDAGES/DRESSINGS) ×3 IMPLANT

## 2016-11-24 NOTE — Anesthesia Preprocedure Evaluation (Addendum)
Anesthesia Evaluation  Patient identified by MRN, date of birth, ID band Patient awake    Reviewed: Allergy & Precautions, NPO status , Patient's Chart, lab work & pertinent test results  History of Anesthesia Complications (+) PONV  Airway Mallampati: II  TM Distance: >3 FB     Dental  (+) Edentulous Upper, Edentulous Lower, Dental Advisory Given   Pulmonary neg pulmonary ROS,    breath sounds clear to auscultation       Cardiovascular hypertension, + Valvular Problems/Murmurs  Rhythm:Regular Rate:Normal     Neuro/Psych    GI/Hepatic negative GI ROS, Neg liver ROS,   Endo/Other  negative endocrine ROS  Renal/GU Renal disease     Musculoskeletal   Abdominal   Peds  Hematology   Anesthesia Other Findings   Reproductive/Obstetrics                            Anesthesia Physical Anesthesia Plan  ASA: III  Anesthesia Plan: General   Post-op Pain Management:    Induction: Intravenous  Airway Management Planned: Oral ETT  Additional Equipment:   Intra-op Plan:   Post-operative Plan: Possible Post-op intubation/ventilation  Informed Consent:   Dental advisory given  Plan Discussed with: CRNA and Anesthesiologist  Anesthesia Plan Comments:         Anesthesia Quick Evaluation

## 2016-11-24 NOTE — H&P (View-Only) (Signed)
TOTAL KNEE REVISION ADMISSION H&P  Patient is being admitted for right revision total knee arthroplasty.  Subjective:  Chief Complaint:right knee pain.  HPI: Dawn Lawson, 83 y.o. female, has a history of pain and functional disability in the right knee(s) due to failed previous arthroplasty and patient has failed non-surgical conservative treatments for greater than 12 weeks to include antibiotic spacer and IV abx. The indications for the revision of the total knee arthroplasty are history of total knee infection. Onset of symptoms was gradual starting 2 years ago with gradually worsening course since that time.  Prior procedures on the right knee(s) include arthroplasty.  This condition presents safety issues increasing the risk of falls. This patient has had resection TKA.  There is no current active infection.  Patient Active Problem List   Diagnosis Date Noted  . Infection of total right knee replacement ([place]) 09/18/2016  . Macular degeneration 10/14/2014  . Vitamin D deficiency 10/14/2014  . HTN (hypertension) 10/14/2014  . Anemia 10/14/2014  . Obesity (BMI 30-39.9) 10/14/2014  . Arthritis 10/14/2014  . Cellulitis of right leg 03/22/2014   Past Medical History:  Diagnosis Date  . AKI (acute kidney injury) ([place]) 03/22/2014  . Arthritis   . ATN (acute tubular necrosis) ([place]) 03/22/2014  . Cataract   . Heart murmur   . Hypertension   . Lactic acidosis 03/22/2014  . PONV (postoperative nausea and vomiting)   . Sepsis due to group B Streptococcus ([place]) 04/14/2014  . Severe sepsis with acute organ dysfunction ([place]) 03/22/2014  . Status post PICC central line placement 04/14/2014  . Urinary tract infection 03/22/2014    Past Surgical History:  Procedure Laterality Date  . ABDOMINAL HYSTERECTOMY     partial  . APPENDECTOMY    . APPLICATION OF WOUND VAC Right 09/18/2016   Procedure: APPLICATION OF WOUND VAC;  Surgeon: Rod Can, MD;  Location: [place];  Service: Orthopedics;   Laterality: Right;  . EXCISIONAL TOTAL KNEE ARTHROPLASTY WITH ANTIBIOTIC SPACERS Right 09/18/2016   Procedure: RESECTION OF RIGHT TOTAL KNEE ARTHROPLASTY PLACEMENT OF ANTIBIOTIC SPACER;  Surgeon: Rod Can, MD;  Location: [place];  Service: Orthopedics;  Laterality: Right;  . EYE SURGERY    . JOINT REPLACEMENT Right    KNEE  . TEE WITHOUT CARDIOVERSION N/A 04/09/2014   Procedure: TRANSESOPHAGEAL ECHOCARDIOGRAM (TEE);  Surgeon: Lelon Perla, MD;  Location: [facility] ENDOSCOPY;  Service: Cardiovascular;  Laterality: N/A;     (Not in a hospital admission) Allergies  Allergen Reactions  . Penicillins Hives and Itching    Tolerated Ancef Nov 2017.  TDD  Has patient had a PCN reaction causing immediate rash, facial/tongue/throat swelling, SOB or lightheadedness with hypotension: NO Has patient had a PCN reaction causing severe rash involving mucus membranes or skin necrosis: {NO Has patient had a PCN reaction that required hospitalization {NO Has patient had a PCN reaction occurring within the last 10 years:  # # YES # #  If all of the above answers are "NO", then may proceed with Cephalosporin use.  . Ampicillin Hives and Itching    Social History  Substance Use Topics  . Smoking status: Never Smoker  . Smokeless tobacco: Never Used  . Alcohol use No    Family History  Problem Relation Age of Onset  . Heart disease Mother   . Cancer Mother   . Heart disease Father       Review of Systems  Constitutional: Negative.   HENT: Negative.   Eyes: Negative.  Respiratory: Negative.   Cardiovascular: Negative.   Gastrointestinal: Negative.   Musculoskeletal: Positive for joint pain.  Skin: Negative.   Neurological: Negative.   Endo/Heme/Allergies: Negative.   Psychiatric/Behavioral: Negative.      Objective:  Physical Exam  Vital signs in last 24 hours: @VSRANGES @  Labs:  Estimated body mass index is 36.04 kg/m as calculated from the following:   Height as of 10/31/16: 4'  11.75" (1.518 m).   Weight as of 10/31/16: 83 kg (183 lb).  Imaging Review Plain radiographs demonstrate static spacer  Assessment/Plan:  Status post resection right TKA with retained spacer  The patient history, physical examination, clinical judgment of the provider and imaging studies are consistent with end stage degenerative joint disease of the right knee(s), previous total knee arthroplasty. Right knee arthrodesis is deemed medically necessary. The treatment options including medical management, injection therapy, arthroscopy and revision arthroplasty were discussed at length. The risks and benefits of revision total knee arthroplasty were presented and reviewed. The risks due to aseptic loosening, infection, stiffness, patella tracking problems, thromboembolic complications and other imponderables were discussed. The patient acknowledged the explanation, agreed to proceed with the plan and consent was signed. Patient is being admitted for inpatient treatment for surgery, pain control, PT, OT, prophylactic antibiotics, VTE prophylaxis, progressive ambulation and ADL's and discharge planning.The patient is planning to be discharged home with home health services

## 2016-11-24 NOTE — Discharge Instructions (Signed)
Dr. Rod Can [facility] [address], [place], [place] [address] [phone]  KNEE POSTOPERATIVE DIRECTIONS    Knee Rehabilitation, Guidelines Following Surgery  Results after knee surgery are often greatly improved when you follow the exercise, range of motion and muscle strengthening exercises prescribed by your doctor. Safety measures are also important to protect the knee from further injury. Any time any of these exercises cause you to have increased pain or swelling in your knee joint, decrease the amount until you are comfortable again and slowly increase them. If you have problems or questions, call your caregiver or physical therapist for advice.   WEIGHT BEARING Weight bearing as tolerated with assist device (walker, cane, etc) as directed, use it as long as suggested by your surgeon or therapist, typically at least 4-6 weeks.  HOME CARE INSTRUCTIONS  Remove items at home which could result in a fall. This includes throw rugs or furniture in walking pathways.  Continue medications as instructed at time of discharge. You may have some home medications which will be placed on hold until you complete the course of blood thinner medication.  You may start showering once you are discharged home but do not submerge the incision under water. Just pat the incision dry and apply a dry gauze dressing on daily. Walk with walker as instructed.  You may resume a sexual relationship in one month or when given the OK by your doctor.   Use walker as long as suggested by your caregivers.  Avoid periods of inactivity such as sitting longer than an hour when not asleep. This helps prevent blood clots.  You may put full weight on your legs and walk as much as is comfortable.  You may return to work once you are cleared by your doctor.  Do not drive a car for 6 weeks or until released by you surgeon.   Do not drive while taking narcotics.    Wear the elastic stockings for three weeks following surgery during the day but you may remove then at night. Make sure you keep all of your appointments after your operation with all of your doctors and caregivers. You should call the office at the above phone number and make an appointment for approximately two weeks after the date of your surgery. Do not remove your surgical dressing. The dressing is waterproof; you may take showers in 3 days, but do not take tub baths or submerge the dressing. Please pick up a stool softener and laxative for home use as long as you are requiring pain medications.  ICE to the affected knee every three hours for 30 minutes at a time and then as needed for pain and swelling.  Continue to use ice on the knee for pain and swelling from surgery. You may notice swelling that will progress down to the foot and ankle.  This is normal after surgery.  Elevate the leg when you are not up walking on it.   It is important for you to complete the blood thinner medication as prescribed by your doctor.  Continue to use the breathing machine which will help keep your temperature down.  It is common for your temperature to cycle up and down following surgery, especially at night when you are not up moving around and exerting yourself.  The breathing machine keeps your lungs expanded and your temperature down.  RANGE OF MOTION AND STRENGTHENING EXERCISES  Rehabilitation of the knee is important following a knee  injury or an operation. After just a few days of immobilization, the muscles of the thigh which control the knee become weakened and shrink (atrophy). Knee exercises are designed to build up the tone and strength of the thigh muscles and to improve knee motion. These exercises can be done on a training (exercise) mat, on the floor, on a table or on a bed. Use what ever works the best and is most comfortable for you Knee exercises include:  Leg Lifts - While your knee is still  immobilized in a splint or cast, you can do straight leg raises. Lift the leg to 60 degrees, hold for 3 sec, and slowly lower the leg. Repeat 10-20 times 2-3 times daily. Perform this exercise against resistance later as your knee gets better.  Quad and Hamstring Sets - Tighten up the muscle on the front of the thigh (Quad) and hold for 5-10 sec. Repeat this 10-20 times hourly. Hamstring sets are done by pushing the foot backward against an object and holding for 5-10 sec. Repeat as with quad sets.  A rehabilitation program following serious knee injuries can speed recovery and prevent re-injury in the future due to weakened muscles. Contact your doctor or a physical therapist for more information on knee rehabilitation.   SKILLED REHAB INSTRUCTIONS: If the patient is transferred to a skilled rehab facility following release from the hospital, a list of the current medications will be sent to the facility for the patient to continue.  When discharged from the skilled rehab facility, please have the facility set up the patient's [place] prior to being released. Also, the skilled facility will be responsible for providing the patient with their medications at time of release from the facility to include their pain medication, the muscle relaxants, and their blood thinner medication. If the patient is still at the rehab facility at time of the two week follow up appointment, the skilled rehab facility will also need to assist the patient in arranging follow up appointment in our office and any transportation needs.  MAKE SURE YOU:  Understand these instructions.  Will watch your condition.  Will get help right away if you are not doing well or get worse.    Pick up stool softner and laxative for home use following surgery while on pain medications. Do NOT remove your dressing. You may shower.  Do not take tub baths or submerge incision under water. May shower starting three days  after surgery. Please use a clean towel to pat the incision dry following showers. Continue to use ice for pain and swelling after surgery. Do not use any lotions or creams on the incision until instructed by your surgeon.

## 2016-11-24 NOTE — Anesthesia Procedure Notes (Signed)
Procedure Name: Intubation Date/Time: 11/24/2016 7:35 AM Performed by: Barrington Ellison Pre-anesthesia Checklist: Patient identified, Emergency Drugs available, Suction available and Patient being monitored Patient Re-evaluated:Patient Re-evaluated prior to inductionOxygen Delivery Method: Circle System Utilized Preoxygenation: Pre-oxygenation with 100% oxygen Intubation Type: IV induction Ventilation: Mask ventilation without difficulty Laryngoscope Size: Mac and 3 Grade View: Grade I Tube type: Oral Tube size: 7.0 mm Number of attempts: 1 Airway Equipment and Method: Stylet and Oral airway Placement Confirmation: ETT inserted through vocal cords under direct vision,  positive ETCO2 and breath sounds checked- equal and bilateral Secured at: 21 cm Tube secured with: Tape Dental Injury: Teeth and Oropharynx as per pre-operative assessment

## 2016-11-24 NOTE — Anesthesia Postprocedure Evaluation (Signed)
Anesthesia Post Note  Patient: Dawn Lawson  Procedure(s) Performed: Procedure(s) (LRB): Removal of right knee spacer, Right knee arthrodesis (Right)  Patient location during evaluation: PACU Anesthesia Type: General Level of consciousness: awake Pain management: pain level controlled Vital Signs Assessment: post-procedure vital signs reviewed and stable Respiratory status: spontaneous breathing Cardiovascular status: stable Anesthetic complications: no       Last Vitals:  Vitals:   11/24/16 1200 11/24/16 1215  BP: (!) 162/64 (!) 163/59  Pulse: 85 86  Resp: 16 16  Temp:      Last Pain:  Vitals:   11/24/16 0625  TempSrc: Oral                 [name]

## 2016-11-24 NOTE — Transfer of Care (Signed)
Immediate Anesthesia Transfer of Care Note  Patient: Dawn Lawson  Procedure(s) Performed: Procedure(s): Removal of right knee spacer, Right knee arthrodesis (Right)  Patient Location: PACU  Anesthesia Type:General  Level of Consciousness: awake  Airway & Oxygen Therapy: Patient Spontanous Breathing and Patient connected to nasal cannula oxygen  Post-op Assessment: Report given to RN  Post vital signs: Reviewed and stable  Last Vitals:  Vitals:   11/24/16 0625 11/24/16 0626  BP:  (!) 177/45  Pulse: 69   Resp: 20   Temp: 36.9 C     Last Pain:  Vitals:   11/24/16 0625  TempSrc: Oral      Patients Stated Pain Goal: 3 ([id number])  Complications: No apparent anesthesia complications

## 2016-11-24 NOTE — Interval H&P Note (Signed)
History and Physical Interval Note:  11/24/2016 7:16 AM  Dawn Lawson  has presented today for surgery, with the diagnosis of Status post resection right total knee arthroplasty  The various methods of treatment have been discussed with the patient and family. After consideration of risks, benefits and other options for treatment, the patient has consented to  Procedure(s): Removal of right knee spacer, Right knee arthrodesis (Right) as a surgical intervention .  The patient's history has been reviewed, patient examined, no change in status, stable for surgery.  I have reviewed the patient's chart and labs.  Questions were answered to the patient's satisfaction.     Crawford Tamura, Horald Pollen

## 2016-11-24 NOTE — Brief Op Note (Signed)
11/24/2016  10:50 AM  PATIENT:  Dawn Lawson  83 y.o. female  PRE-OPERATIVE DIAGNOSIS:  Status post resection right total knee arthroplasty  POST-OPERATIVE DIAGNOSIS:  same   PROCEDURE:  Procedure(s): Removal of right knee spacer, Right knee arthrodesis (Right)  SURGEON:  Surgeon(s) and Role:    * Rod Can, MD - Primary  PHYSICIAN ASSISTANT: none  ASSISTANTS: carla bethune, rnfa   ANESTHESIA:   general  EBL:  Total I/O In: 1000 [I.V.:1000] Out: 1100 [Urine:950; Blood:150]  BLOOD ADMINISTERED:none  DRAINS: prevena vac   LOCAL MEDICATIONS USED:  MARCAINE     SPECIMEN:  Source of Specimen:  right knee tissue cultures x3  DISPOSITION OF SPECIMEN:  micro  COUNTS:  YES  TOURNIQUET: 120 min  DICTATION: .Other Dictation: Dictation Number [id number]  PLAN OF CARE: Admit to inpatient   PATIENT DISPOSITION:  PACU - hemodynamically stable.   Delay start of Pharmacological VTE agent (>24hrs) due to surgical blood loss or risk of bleeding: not applicable

## 2016-11-24 NOTE — Clinical Social Work Note (Signed)
Pt is from [facility] and will return at d/c.  [address], [place]

## 2016-11-25 LAB — CBC
HCT: 27.2 % — ABNORMAL LOW (ref 36.0–46.0)
Hemoglobin: 8.3 g/dL — ABNORMAL LOW (ref 12.0–15.0)
MCH: 25.8 pg — AB (ref 26.0–34.0)
MCHC: 30.5 g/dL (ref 30.0–36.0)
MCV: 84.5 fL (ref 78.0–100.0)
PLATELETS: 276 10*3/uL (ref 150–400)
RBC: 3.22 MIL/uL — ABNORMAL LOW (ref 3.87–5.11)
RDW: 16 % — AB (ref 11.5–15.5)
WBC: 9.1 10*3/uL (ref 4.0–10.5)

## 2016-11-25 LAB — BASIC METABOLIC PANEL
Anion gap: 8 (ref 5–15)
BUN: 17 mg/dL (ref 6–20)
CALCIUM: 8.2 mg/dL — AB (ref 8.9–10.3)
CO2: 22 mmol/L (ref 22–32)
CREATININE: 0.98 mg/dL (ref 0.44–1.00)
Chloride: 104 mmol/L (ref 101–111)
GFR calc Af Amer: 59 mL/min — ABNORMAL LOW (ref >60)
GFR, EST NON AFRICAN AMERICAN: 51 mL/min — AB (ref >60)
GLUCOSE: 172 mg/dL — AB (ref 65–99)
Potassium: 4 mmol/L (ref 3.5–5.1)
SODIUM: 134 mmol/L — AB (ref 135–145)

## 2016-11-25 NOTE — Plan of Care (Signed)
Problem: Pain Managment: Goal: General experience of comfort will improve Outcome: Progressing Denies pain  Problem: Physical Regulation: Goal: Will remain free from infection Outcome: Progressing No S/S of infection noted  Problem: Skin Integrity: Goal: Risk for impaired skin integrity will decrease Outcome: Progressing No skin issues noted  Problem: Tissue Perfusion: Goal: Risk factors for ineffective tissue perfusion will decrease Outcome: Progressing No s/S of DVT noted  Problem: Activity: Goal: Risk for activity intolerance will decrease Outcome: Progressing Tolerates bed mobility well  Problem: Bowel/Gastric: Goal: Will not experience complications related to bowel motility Outcome: Progressing Denies gastric and bowel related issues

## 2016-11-25 NOTE — Progress Notes (Signed)
OT Cancellation Note  Patient Details Name: JEANNEE DANIS MRN: [id number] DOB: 11/15/1928   Cancelled Treatment:    Reason Eval/Treat Not Completed: Other (comment) (Pt from SNF with plan to return to SNF; will defer OT). Please re-consult if needs change. Thank you for this referral.  Binnie Kand M.S., OTR/L Pager: [phone]  11/25/2016, 11:21 AM

## 2016-11-25 NOTE — Evaluation (Signed)
Physical Therapy Evaluation Patient Details Name: Dawn Lawson MRN: [id number] DOB: 11-Oct-1929 Today's Date: 11/25/2016   History of Present Illness  Dawn Lawson, 83 y.o.female,has a history of pain and functional disability in the rightknee(s)due toInfected previous arthroplasty. The indications for the revisionof thetotal knee arthroplasty arehistory of total knee infection. Pt s/p resection of R TKA placement of antibiotic spacer and application of wound vac.  Clinical Impression  Pt is POD 1 following the above procedure. Prior to admission, pt was receiving rehab at [facility]. Pt would like to return here to complete her rehab and then return to her home where she lives alone. Pt was completely independent prior to current complication. Pt requires min to mod A for bed mobs, transfers, and gait this session. Pt will benefit from continued acute PT services to address below deficits before DC to next venue of care.     Follow Up Recommendations SNF;Supervision/Assistance - 24 hour    Equipment Recommendations  None recommended by PT    Recommendations for Other Services       Precautions / Restrictions Precautions Precautions: Knee Precaution Booklet Issued: Yes (comment) Precaution Comments: Knee handout given and reviewed no pillow under knee Restrictions Weight Bearing Restrictions: Yes RLE Weight Bearing: Weight bearing as tolerated      Mobility  Bed Mobility Overal bed mobility: Needs Assistance Bed Mobility: Supine to Sit     Supine to sit: Mod assist     General bed mobility comments: Requires assistance to bring LE's EOB and to stabilize trunk once sitting.   Transfers Overall transfer level: Needs assistance Equipment used: Rolling walker (2 wheeled) Transfers: Sit to/from Stand Sit to Stand: Min assist;From elevated surface         General transfer comment: Pt slow to standing with cues for hand placement with standing.    Ambulation/Gait Ambulation/Gait assistance: Min assist Ambulation Distance (Feet): 5 Feet Assistive device: Rolling walker (2 wheeled) Gait Pattern/deviations: Step-to pattern;Antalgic Gait velocity: decreased Gait velocity interpretation: Below normal speed for age/gender General Gait Details: Moderate antalgic gait with cues for sequencing  Stairs            Wheelchair Mobility    Modified Rankin (Stroke Patients Only)       Balance                                             Pertinent Vitals/Pain Pain Assessment: 0-10 Pain Score: 3  Pain Location: right knee Pain Descriptors / Indicators: Aching;Guarding Pain Intervention(s): Monitored during session;Limited activity within patient's tolerance;Premedicated before session;Ice applied    Home Living Family/patient expects to be discharged to:: Skilled nursing facility Living Arrangements: Alone               Additional Comments: per family pt driving, using cane and doing everything on her own    Prior Function Level of Independence: Independent               Hand Dominance   Dominant Hand: Right    Extremity/Trunk Assessment   Upper Extremity Assessment Upper Extremity Assessment: Defer to OT evaluation    Lower Extremity Assessment Lower Extremity Assessment: RLE deficits/detail RLE Deficits / Details: pt presents with post op pain and weakness. At least 3/5 ankle and 2/5 knee and hip per gross functional assessment    Cervical / Trunk Assessment Cervical / Trunk  Assessment: Normal  Communication   Communication: No difficulties  Cognition Arousal/Alertness: Awake/alert Behavior During Therapy: WFL for tasks assessed/performed Overall Cognitive Status: Within Functional Limits for tasks assessed                      General Comments      Exercises     Assessment/Plan    PT Assessment Patient needs continued PT services  PT Problem List Decreased  strength;Decreased range of motion;Decreased activity tolerance;Decreased balance;Decreased mobility;Decreased knowledge of use of DME          PT Treatment Interventions Gait training;Functional mobility training;Therapeutic activities;Therapeutic exercise;Balance training;Patient/family education    PT Goals (Current goals can be found in the Care Plan section)  Acute Rehab PT Goals Patient Stated Goal: go back to rehab then home PT Goal Formulation: With patient Time For Goal Achievement: 12/02/16 Potential to Achieve Goals: Good    Frequency 7X/week   Barriers to discharge        Co-evaluation               End of Session Equipment Utilized During Treatment: Gait belt Activity Tolerance: Patient tolerated treatment well;Patient limited by pain Patient left: in chair;with call bell/phone within reach Nurse Communication: Mobility status;Patient requests pain meds         Time: [id number] PT Time Calculation (min) (ACUTE ONLY): 34 min   Charges:   PT Evaluation $PT Eval Moderate Complexity: 1 Procedure PT Treatments $Therapeutic Activity: 8-22 mins   PT G Codes:        Scheryl Marten PT, DPT  [phone]  11/25/2016, 12:47 PM

## 2016-11-25 NOTE — Op Note (Signed)
NAME:  Dawn Lawson, Dawn Lawson NO.:  [id number]  MEDICAL RECORD NO.:  [id number]  LOCATION:  5N03C                        FACILITY:  [facility]  PHYSICIAN:  Rod Can, MD     DATE OF BIRTH:  1929/09/15  DATE OF PROCEDURE:  11/24/2016 DATE OF DISCHARGE:                              OPERATIVE REPORT   SURGEON:  Rod Can, MD  ASSISTANT:  Laure Kidney, RNFA.  PREOPERATIVE DIAGNOSIS:  Status post resection, right total knee arthroplasty with retained static antibiotic spacer.  POSTOPERATIVE DIAGNOSIS:  Status post resection, right total knee arthroplasty with retained static antibiotic spacer.  PROCEDURES PERFORMED: 1. Removal of deep implant from right knee. 2. Right knee arthrodesis.  IMPLANTS:  Biomet OSS knee arthrodesis system with: 1. 13 x 150 mm bowed stem with 1-cm elliptical diaphyseal connector. 2. 11 x 150 mm straight stem with 1-cm elliptical diaphyseal     connector. 3. 7-degree collar assembly with locking bolts. 4. Palacos R+G cement with 1 g of vancomycin per pack. 5. Stimulan beads with 1 g of vancomycin.  ANESTHESIA:  General.  ESTIMATED BLOOD LOSS:  150 mL.  SPECIMENS: 1. Femoral interface membrane for culture. 2. Tibial interface membrane for culture. 3. Pseudocapsule, right knee for culture.  COMPLICATIONS:  None.  TUBES AND DRAINS:  Prevena wound VAC.  DISPOSITION:  Stable to PACU.  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  She had a previous resection arthroplasty with placement of static antibiotic spacer by myself.  She was treated for 6 weeks of IV antibiotics.  Two-week antibiotic holiday was undertaken.  Her inflammatory markers were checked and they were somewhat elevated.  I aspirated her knee in the office and testing was negative.  We therefore discussed right knee arthrodesis.  She elected to proceed after  the risks, benefits, alternatives were discussed.  DESCRIPTION OF PROCEDURE IN DETAIL:  I identified the patient holding area and marked the surgical site.  She was taken to the operating room. General anesthesia was induced on her bed.  The right lower extremity was prepped and draped in a normal sterile surgical fashion.  She did receive 1 g of vancomycin within 60 minutes of beginning the procedure. We used Esmarch to exsanguinate the lower extremity.  Tourniquet was elevated to 340 mmHg.  I utilized her previous incision, which I sharply excised with a #10 blade.  I created full-thickness skin flaps and identified the extensor mechanism.  Standard medial parapatellar arthrotomy was performed.  I then performed a radical synovectomy of the suprapatellar pouch, medial and lateral gutters.  There was no overt evidence of infection.  I identified the spacer.  I used osteotomes to remove the spacer and I identified the Rush rod, which was cut with a bolt cutter.  The IM rods were removed with the entirety of the cement. She did have a small fluid collection just behind the spacer block.  I removed some tissue from this area.  This was sent off as a tissue culture.  I then used back scratchers to clear the femoral and tibial canals and some  interface membrane from representative sample from each was sent to the lab for culture.  I then used flexible reamers.  I reamed up to a 15 on the femur, 13 on the tibia, thus preparing for an 11 stem on the tibia and a 13 on the femur.  I used a saw to freshen our proximal tibia and distal femur cuts.  There was no dead bone.  No necrotic tissue.  No real evidence of infection at this point.  The wound was then copiously irrigated with saline, 9 L.  I then inserted a trial arthrodesis, which fit excellently.  I then clean the canals with a brush.  They were washed, irrigated, dried.  Placed cement restrictors.  I then cemented the tibia, which was  held in place. Excess cement was cleared.  I then cemented the femur.  I then assembled the arthrodesis using the 7-degree collar assembly.  The appropriate external rotation of the tibia was held.  The screws were tightened followed by the locking bolt.  Stimulan beads were placed in the joint. I closed the arthrotomy with #1 PDS and #0 Stratafix.  Deep dermal layer was closed with 2-0 interrupted Monocryl, 2-0 nylon mattress sutures for the skin.  Prevena VAC was applied.  The patient was then extubated, taken back in stable condition.  Sponge, needle, and instrument counts were correct at the end of the case x2.  There were no known complications.  Postoperatively, we will admit her to the hospital.  She may weight bear as tolerated, right lower extremity with a walker.  She will receive physical and occupational therapy.  We will watch her 3 tissue cultures. I will keep her on IV Ancef until her cultures are finalized negative. If her cultures become positive, she will need a PICC line and 6 weeks of antibiotics followed by chronic lifelong suppression.  The patient will undergo disposition planning.  I will see her in the office 1 week after discharge for Prevena removal.          ______________________________ Rod Can, MD     BS/MEDQ  D:  11/24/2016  T:  11/25/2016  Job:  [id number]

## 2016-11-25 NOTE — Progress Notes (Signed)
     Subjective: 1 Day Post-Op Procedure(s) (LRB): Removal of right knee spacer, Right knee arthrodesis (Right)   Patient reports pain as mild, pain controlled.  No events throughout the night.  Wound vac is in good position / condition.  There is no drainage in the tubing or canister of the wound vac.   Objective:   VITALS:   Vitals:   11/25/16 0100 11/25/16 0526  BP: (!) 128/32 (!) 149/48  Pulse: 70 77  Resp:    Temp: 98.3 F (36.8 C) 98.6 F (37 C)    Dorsiflexion/Plantar flexion intact Incision: dressing C/D/I No cellulitis present Compartment soft  LABS  Recent Labs  11/24/16 0641 11/24/16 1416 11/25/16 0845  HGB 9.8* 9.5* 8.3*  HCT 32.0* 29.8* 27.2*  WBC 8.8 18.6* 9.1  PLT 361 285 276     Recent Labs  11/24/16 0641 11/24/16 1416 11/25/16 0845  NA 136  --  134*  K 4.0  --  4.0  BUN 20  --  17  CREATININE 0.75 0.76 0.98  GLUCOSE 115*  --  172*     Assessment/Plan: 1 Day Post-Op Procedure(s) (LRB): Removal of right knee spacer, Right knee arthrodesis (Right)  Up with therapy Discharge planned for Mon/Tues  Dr. Lyla Glassing will follow and make a determination with regards to antibiotics    Dawn Lawson   PAC  11/25/2016, 10:45 AM

## 2016-11-26 LAB — CBC
HCT: 23 % — ABNORMAL LOW (ref 36.0–46.0)
Hemoglobin: 7.3 g/dL — ABNORMAL LOW (ref 12.0–15.0)
MCH: 26.2 pg (ref 26.0–34.0)
MCHC: 31.7 g/dL (ref 30.0–36.0)
MCV: 82.4 fL (ref 78.0–100.0)
PLATELETS: 262 10*3/uL (ref 150–400)
RBC: 2.79 MIL/uL — AB (ref 3.87–5.11)
RDW: 15.8 % — ABNORMAL HIGH (ref 11.5–15.5)
WBC: 10.3 10*3/uL (ref 4.0–10.5)

## 2016-11-26 MED ORDER — CEPHALEXIN 500 MG PO CAPS
500.0000 mg | ORAL_CAPSULE | Freq: Three times a day (TID) | ORAL | Status: AC
  Administered 2016-11-26 – 2016-11-27 (×2): 500 mg via ORAL
  Filled 2016-11-26 (×2): qty 1

## 2016-11-26 MED ORDER — LOPERAMIDE HCL 2 MG PO CAPS
2.0000 mg | ORAL_CAPSULE | ORAL | Status: DC | PRN
  Administered 2016-11-26: 2 mg via ORAL
  Filled 2016-11-26: qty 1

## 2016-11-26 NOTE — Clinical Social Work Note (Signed)
Clinical Social Work Assessment  Patient Details  Name: Dawn Lawson MRN: [id number] Date of Birth: 04-05-1929  Date of referral:  11/26/16               Reason for consult:  Facility Placement                Permission sought to share information with:  Family Supports Permission granted to share information::     Name::        Agency::     Relationship::     Contact Information:     Housing/Transportation Living arrangements for the past 2 months:  [place], [place] of Information:  Patient Patient Interpreter Needed:  None Criminal Activity/Legal Involvement Pertinent to Current Situation/Hospitalization:  No - Comment as needed Significant Relationships:  Adult Children Lives with:  Self Do you feel safe going back to the place where you live?    Need for family participation in patient care:     Care giving concerns:  No family or friends present at bedside during initial assessment.    Social Worker assessment / plan:  CSW spoke with pt at bedside to complete initial assessment. Pt lives home alone. Pt was at [facility] prior to admission. The plan is for pt to return to [facility]. Pt is agreeable. CSW will reach out to facility and set up PTAR once pt is medically cleared.   Employment status:  Retired Forensic scientist:  Medicare PT Recommendations:  [place] / Referral to community resources:  [place]  Patient/Family's Response to care:  Pt verbalized understanding of CSW role and expressed appreciation for support. Pt denies any concern regarding pt care at this time.   Patient/Family's Understanding of and Emotional Response to Diagnosis, Current Treatment, and Prognosis:  Pt understanding and realistic regarding physical limitations. Pt understands the need for SNF placement at d/c. Pt agreeable to SNF placement at d/c, at this time. Pt's responses emotionally appropriate during  conversation with CSW. Pt denies any concern regarding treatment plan at this time. CSW will continue to provide support and facilitate d/c needs.   Emotional Assessment Appearance:  Appears younger than stated age Attitude/Demeanor/Rapport:   (Patient was appropriate.) Affect (typically observed):  Accepting, Appropriate, Calm, Quiet Orientation:  Oriented to Situation, Oriented to  Time, Oriented to Place, Oriented to Self Alcohol / Substance use:  Not Applicable Psych involvement (Current and /or in the community):  No (Comment)  Discharge Needs  Concerns to be addressed:  No discharge needs identified Readmission within the last 30 days:  No Current discharge risk:  Dependent with Mobility Barriers to Discharge:  Continued Medical Work up   [employer], LCSW 11/26/2016, 10:39 AM

## 2016-11-26 NOTE — Clinical Social Work Placement (Signed)
   CLINICAL SOCIAL WORK PLACEMENT  NOTE  Date:  11/26/2016  Patient Details  Name: Dawn Lawson MRN: [id number] Date of Birth: 11/27/1928  Clinical Social Work is seeking post-discharge placement for this patient at the [place] level of care (*CSW will initial, date and re-position this form in  chart as items are completed):      Patient/family provided with [facility] Work Department's list of facilities offering this level of care within the geographic area requested by the patient (or if unable, by the patient's family).  Yes   Patient/family informed of their freedom to choose among providers that offer the needed level of care, that participate in Medicare, Medicaid or managed care program needed by the patient, have an available bed and are willing to accept the patient.      Patient/family informed of [facility]'s ownership interest in [facility] and [facility], as well as of the fact that they are under no obligation to receive care at these facilities.  PASRR submitted to EDS on       PASRR number received on 11/26/16     Existing PASRR number confirmed on       FL2 transmitted to all facilities in geographic area requested by pt/family on 11/26/16     FL2 transmitted to all facilities within larger geographic area on       Patient informed that his/her managed care company has contracts with or will negotiate with certain facilities, including the following:        Yes   Patient/family informed of bed offers received.  Patient chooses bed at [facility] [place] recommends and patient chooses bed at      Patient to be transferred to [facility] [place] on 11/27/16.  Patient to be transferred to facility by PTAR     Patient family notified on   of transfer.  Name of family member notified:        PHYSICIAN Please prepare priority discharge summary, including medications, Please prepare  prescriptions, Please sign FL2     Additional Comment:    _______________________________________________ Alla German, LCSW 11/26/2016, 10:41 AM

## 2016-11-26 NOTE — NC FL2 (Signed)
[place] LEVEL OF CARE SCREENING TOOL     IDENTIFICATION  Patient Name: Dawn Lawson Birthdate: 28-Mar-1929 Sex: female Admission Date (Current Location): 11/24/2016  [facility] [place] Number:  Herbalist and Address:  [facility] [place], [place] [address] [place], [place] [address]      Provider Number: [id number]  Attending Physician Name and Address:  Rod Can, MD  Relative Name and Phone Number:       Current Level of Care: Hospital Recommended Level of Care: [place] Prior Approval Number:    Date Approved/Denied:   PASRR Number: [id number]  Discharge Plan: SNF    Current Diagnoses: Patient Active Problem List   Diagnosis Date Noted  . History of infection of total joint prosthesis of knee 11/24/2016  . Infection of total right knee replacement ([facility]) 09/18/2016  . Macular degeneration 10/14/2014  . Vitamin D deficiency 10/14/2014  . HTN (hypertension) 10/14/2014  . Anemia 10/14/2014  . Obesity (BMI 30-39.9) 10/14/2014  . Arthritis 10/14/2014  . Cellulitis of right leg 03/22/2014    Orientation RESPIRATION BLADDER Height & Weight     Self, Time, Situation, Place  O2 (Nasal Cannula, 2L) Continent Weight: 174 lb (78.9 kg) Height:  4\' 11"  (149.9 cm)  BEHAVIORAL SYMPTOMS/MOOD NEUROLOGICAL BOWEL NUTRITION STATUS      Continent  (Please see discharge summary)  AMBULATORY STATUS COMMUNICATION OF NEEDS Skin   Limited Assist Verbally Surgical wounds, Wound Vac (Closed incision right leg, compression wrap.  Closed incision right knee., negative pressure wound therapy, pressure 133mmHg.)                       Personal Care Assistance Level of Assistance  Bathing, Feeding, Dressing Bathing Assistance: Limited assistance Feeding assistance: Independent Dressing Assistance: Limited assistance     Functional Limitations Info  Sight, Hearing, Speech Sight Info: Adequate Hearing Info:  Adequate Speech Info: Adequate    SPECIAL CARE FACTORS FREQUENCY  PT (By licensed PT), OT (By licensed OT)     PT Frequency: 7x week OT Frequency: 7x week            Contractures Contractures Info: Not present    Additional Factors Info  Code Status, Allergies Code Status Info: Full Code Allergies Info: Penicillins, Ampicillin           Current Medications (11/26/2016):  This is the current hospital active medication list Current Facility-Administered Medications  Medication Dose Route Frequency Provider Last Rate Last Dose  . 0.9 %  sodium chloride infusion   Intravenous Continuous Rod Can, MD 75 mL/hr at 11/26/16 0618    . acetaminophen (TYLENOL) tablet 650 mg  650 mg Oral Q6H PRN Rod Can, MD   650 mg at 11/25/16 2005   Or  . acetaminophen (TYLENOL) suppository 650 mg  650 mg Rectal Q6H PRN Rod Can, MD      . alum & mag hydroxide-simeth (MAALOX/MYLANTA) 200-200-20 MG/5ML suspension 30 mL  30 mL Oral Q4H PRN Rod Can, MD      . bisoprolol (ZEBETA) tablet 2.5 mg  2.5 mg Oral Daily Rod Can, MD   2.5 mg at 11/25/16 0848   And  . hydrochlorothiazide 10 mg/mL oral suspension 6.25 mg  6.25 mg Oral Daily Rod Can, MD      . ceFAZolin (ANCEF) IVPB 2g/100 mL premix  2 g Intravenous Q8H Rod Can, MD   2 g at 11/26/16 0557  . diphenhydrAMINE (BENADRYL)  12.5 MG/5ML elixir 12.5-25 mg  12.5-25 mg Oral Q4H PRN Rod Can, MD      . docusate sodium (COLACE) capsule 100 mg  100 mg Oral BID Rod Can, MD   100 mg at 11/24/16 2159  . enoxaparin (LOVENOX) injection 30 mg  30 mg Subcutaneous Q12H Rod Can, MD   30 mg at 11/26/16 [id number]  . feeding supplement (ENSURE ENLIVE) (ENSURE ENLIVE) liquid 237 mL  237 mL Oral BID BM Rod Can, MD      . HYDROcodone-acetaminophen (NORCO/VICODIN) 5-325 MG per tablet 1-2 tablet  1-2 tablet Oral Q4H PRN Rod Can, MD   2 tablet at 11/26/16 [id number]  . HYDROmorphone (DILAUDID) injection 0.5-1 mg   0.5-1 mg Intravenous Q2H PRN Rod Can, MD      . menthol-cetylpyridinium (CEPACOL) lozenge 3 mg  1 lozenge Oral PRN Rod Can, MD       Or  . phenol (CHLORASEPTIC) mouth spray 1 spray  1 spray Mouth/Throat PRN Rod Can, MD      . methocarbamol (ROBAXIN) tablet 500 mg  500 mg Oral Q6H PRN Rod Can, MD   500 mg at 11/26/16 [id number]   Or  . methocarbamol (ROBAXIN) 500 mg in dextrose 5 % 50 mL IVPB  500 mg Intravenous Q6H PRN Rod Can, MD      . metoCLOPramide (REGLAN) tablet 5-10 mg  5-10 mg Oral Q8H PRN Rod Can, MD       Or  . metoCLOPramide (REGLAN) injection 5-10 mg  5-10 mg Intravenous Q8H PRN Rod Can, MD      . ondansetron [facility]) tablet 4 mg  4 mg Oral Q6H PRN Rod Can, MD       Or  . ondansetron (ZOFRAN) injection 4 mg  4 mg Intravenous Q6H PRN Rod Can, MD   4 mg at 11/24/16 1337  . polyethylene glycol (MIRALAX / GLYCOLAX) packet 17 g  17 g Oral Daily PRN Rod Can, MD      . polyethylene glycol (MIRALAX / GLYCOLAX) packet 17 g  17 g Oral Daily PRN Rod Can, MD      . polyvinyl alcohol (LIQUIFILM TEARS) 1.4 % ophthalmic solution 1 drop  1 drop Both Eyes PRN Rod Can, MD      . senna (SENOKOT) tablet 17.2 mg  2 tablet Oral QHS Rod Can, MD   17.2 mg at 11/24/16 2159  . Vitamin D (Ergocalciferol) (DRISDOL) capsule 50,000 Units  50,000 Units Oral Q Sat Rod Can, MD   50,000 Units at 11/25/16 0848     Discharge Medications: Please see discharge summary for a list of discharge medications.  Relevant Imaging Results:  Relevant Lab Results:   Additional Information SSN: [id number]  Alla German, LCSW

## 2016-11-26 NOTE — Progress Notes (Signed)
   Subjective: 2 Days Post-Op Procedure(s) (LRB): Removal of right knee spacer, Right knee arthrodesis (Right)  Pt doing well Ready to go back to SNF tomorrow Denies any new issues or symptoms Patient reports pain as mild.  Objective:   VITALS:   Vitals:   11/26/16 0212 11/26/16 0605  BP:  (!) 160/51  Pulse:  82  Resp: 18 18  Temp:  98.3 F (36.8 C)    Right knee dressing and wound vac in place nv intact distally No rashes or edema  LABS  Recent Labs  11/24/16 1416 11/25/16 0845 11/26/16 0251  HGB 9.5* 8.3* 7.3*  HCT 29.8* 27.2* 23.0*  WBC 18.6* 9.1 10.3  PLT 285 276 262     Recent Labs  11/24/16 0641 11/24/16 1416 11/25/16 0845  NA 136  --  134*  K 4.0  --  4.0  BUN 20  --  17  CREATININE 0.75 0.76 0.98  GLUCOSE 115*  --  172*     Assessment/Plan: 2 Days Post-Op Procedure(s) (LRB): Removal of right knee spacer, Right knee arthrodesis (Right) Plan to d/c to SNF tomorrow Continue IV antibiotics Pain management as needed    Merla Riches, MPAS, PA-C  11/26/2016, 7:53 AM

## 2016-11-26 NOTE — Progress Notes (Signed)
Physical Therapy Treatment Patient Details Name: Dawn Lawson MRN: [id number] DOB: 06-Jun-1929 Today's Date: 11/26/2016    History of Present Illness AMMA GIRDLER, 83 y.o.female,has a history of pain and functional disability in the rightknee(s)due toInfected previous arthroplasty. The indications for the revisionof thetotal knee arthroplasty arehistory of total knee infection. Pt s/p resection of R TKA placement of antibiotic spacer and application of wound vac.    PT Comments    Pt is POD 2 following the above procedure. Pt is moving better with gait this session but continues to require assistance with bed mobs due to knee pain with movement. Pt is able to perform gait with decreased assistance this session. Pt continues to benefit from skilled pt services to address below deficits.    Follow Up Recommendations  SNF;Supervision/Assistance - 24 hour;Other (comment) (Came from [employer])     Equipment Recommendations  None recommended by PT    Recommendations for Other Services       Precautions / Restrictions Precautions Precautions: Knee Precaution Booklet Issued: Yes (comment) Precaution Comments: Knee handout given and reviewed no pillow under knee Restrictions Weight Bearing Restrictions: Yes RLE Weight Bearing: Weight bearing as tolerated    Mobility  Bed Mobility Overal bed mobility: Needs Assistance Bed Mobility: Supine to Sit     Supine to sit: Mod assist     General bed mobility comments: Requires assistance to bring LE's EOB and to stabilize trunk once sitting.   Transfers Overall transfer level: Needs assistance Equipment used: Rolling walker (2 wheeled) Transfers: Sit to/from Stand Sit to Stand: Min assist;From elevated surface         General transfer comment: Pt slow to standing with cues for hand placement with standing.   Ambulation/Gait Ambulation/Gait assistance: Min assist Ambulation Distance (Feet): 10 Feet Assistive device:  Rolling walker (2 wheeled) Gait Pattern/deviations: Step-to pattern;Antalgic Gait velocity: decreased Gait velocity interpretation: Below normal speed for age/gender General Gait Details: Moderate antalgic gait with cues for sequencing   Stairs            Wheelchair Mobility    Modified Rankin (Stroke Patients Only)       Balance                                    Cognition Arousal/Alertness: Awake/alert Behavior During Therapy: WFL for tasks assessed/performed Overall Cognitive Status: Within Functional Limits for tasks assessed                      Exercises Total Joint Exercises Ankle Circles/Pumps: AROM;Both;20 reps;Supine Quad Sets: AROM;Right;10 reps;Supine    General Comments        Pertinent Vitals/Pain Pain Assessment: 0-10 Pain Score: 5  Pain Location: right knee, 10/10 with mobility Pain Descriptors / Indicators: Aching;Guarding;Grimacing Pain Intervention(s): Monitored during session;Repositioned;Patient requesting pain meds-RN notified;RN gave pain meds during session    Home Living                      Prior Function            PT Goals (current goals can now be found in the care plan section) Acute Rehab PT Goals Patient Stated Goal: go back to rehab then home Progress towards PT goals: Progressing toward goals    Frequency    7X/week      PT Plan Current plan remains appropriate    Co-evaluation  End of Session Equipment Utilized During Treatment: Gait belt Activity Tolerance: Patient tolerated treatment well;Patient limited by pain Patient left: in chair;with call bell/phone within reach;with nursing/sitter in room     Time: 0802-0827 PT Time Calculation (min) (ACUTE ONLY): 25 min  Charges:  $Gait Training: 8-22 mins $Therapeutic Activity: 8-22 mins                    G Codes:      Scheryl Marten PT, DPT  [phone]  11/26/2016, 8:31 AM

## 2016-11-27 LAB — CBC
HEMATOCRIT: 22.9 % — AB (ref 36.0–46.0)
HEMOGLOBIN: 7.2 g/dL — AB (ref 12.0–15.0)
MCH: 26 pg (ref 26.0–34.0)
MCHC: 31.4 g/dL (ref 30.0–36.0)
MCV: 82.7 fL (ref 78.0–100.0)
Platelets: 289 10*3/uL (ref 150–400)
RBC: 2.77 MIL/uL — AB (ref 3.87–5.11)
RDW: 16 % — ABNORMAL HIGH (ref 11.5–15.5)
WBC: 9.9 10*3/uL (ref 4.0–10.5)

## 2016-11-27 MED ORDER — HYDROCODONE-ACETAMINOPHEN 5-325 MG PO TABS: 1.0000 | ORAL_TABLET | ORAL | 0 refills | Status: DC | PRN

## 2016-11-27 MED ORDER — ENOXAPARIN SODIUM 40 MG/0.4ML ~~LOC~~ SOLN: 40.0000 mg | Freq: Two times a day (BID) | SUBCUTANEOUS | 0 refills | Status: DC

## 2016-11-27 MED ORDER — CEPHALEXIN 500 MG PO CAPS: 500.0000 mg | ORAL_CAPSULE | Freq: Two times a day (BID) | ORAL | 11 refills | Status: DC

## 2016-11-27 NOTE — Clinical Social Work Note (Signed)
CSW updated [facility] that pt will discharge tomorrow. Facility is able to accept pt at discharge. CSW will continue to follow.   Darden Dates, MSW, LCSW  Clinical Social Worker  [phone]

## 2016-11-27 NOTE — Progress Notes (Signed)
Physical Therapy Treatment Patient Details Name: Dawn Lawson MRN: [id number] DOB: 03/10/1929 Today's Date: 11/27/2016    History of Present Illness Dawn Lawson, 83 y.o.female,has a history of pain and functional disability in the rightknee(s)due toInfected previous arthroplasty. The indications for the revisionof thetotal knee arthroplasty arehistory of total knee infection. Pt s/p resection of R TKA placement of antibiotic spacer and application of wound vac.    PT Comments    Pt progressing slowly with mobility during PT sessions, requiring assistance with bed mobility and transfers. Ambulation distance limited today, requiring additional standing time due to bowel incontinence. Continuing to recommend SNF for further care following acute stay. Pt in agreement.   Follow Up Recommendations  SNF;Supervision for mobility/OOB     Equipment Recommendations  None recommended by PT    Recommendations for Other Services       Precautions / Restrictions Precautions Precautions: Fall Precaution Comments: static antibiotic spacer Restrictions Weight Bearing Restrictions: Yes RLE Weight Bearing: Weight bearing as tolerated    Mobility  Bed Mobility Overal bed mobility: Needs Assistance Bed Mobility: Supine to Sit     Supine to sit: Mod assist     General bed mobility comments: assist needed with trunk and LEs, pt assisting with use of rail  Transfers Overall transfer level: Needs assistance Equipment used: Rolling walker (2 wheeled) Transfers: Sit to/from Stand Sit to Stand: Min assist;From elevated surface         General transfer comment: cues for hand placement but pt insisting on using both hands on rw to stand.   Ambulation/Gait Ambulation/Gait assistance: Min assist Ambulation Distance (Feet): 5 Feet Assistive device: Rolling walker (2 wheeled) Gait Pattern/deviations: Step-to pattern;Decreased stance time - right;Decreased weight shift to right Gait  velocity: decreased   General Gait Details: slow pattern, pt leaning Rt forearm on walker, cues for safety and correct use. Once standing pt incontinent of bowels, additional standing time needed for cleaning with nurse tech assist.    Stairs            Wheelchair Mobility    Modified Rankin (Stroke Patients Only)       Balance Overall balance assessment: Needs assistance Sitting-balance support: No upper extremity supported Sitting balance-Leahy Scale: Fair     Standing balance support: Bilateral upper extremity supported Standing balance-Leahy Scale: Poor Standing balance comment: using rw for support`                    Cognition Arousal/Alertness: Awake/alert Behavior During Therapy: WFL for tasks assessed/performed Overall Cognitive Status: Within Functional Limits for tasks assessed                      Exercises      General Comments        Pertinent Vitals/Pain Pain Assessment: No/denies pain    Home Living                      Prior Function            PT Goals (current goals can now be found in the care plan section) Acute Rehab PT Goals Patient Stated Goal: go home PT Goal Formulation: With patient Time For Goal Achievement: 12/02/16 Potential to Achieve Goals: Good Progress towards PT goals: Progressing toward goals    Frequency    7X/week      PT Plan Current plan remains appropriate    Co-evaluation  End of Session Equipment Utilized During Treatment: Gait belt Activity Tolerance: Patient limited by fatigue Patient left: in chair;with call bell/phone within reach     Time: 0918-0945 PT Time Calculation (min) (ACUTE ONLY): 27 min  Charges:  $Gait Training: 8-22 mins $Therapeutic Activity: 8-22 mins                    G Codes:      Cassell Clement, PT, CSCS Pager [phone] Office [phone]  11/27/2016, 9:58 AM

## 2016-11-27 NOTE — Progress Notes (Signed)
Subjective:  Patient reports pain as moderate.  Denies N/V/CP/SOB.  Objective:   VITALS:   Vitals:   11/26/16 0605 11/26/16 1556 11/26/16 2047 11/27/16 0427  BP: (!) 160/51 (!) 138/44 (!) 152/44 (!) 152/46  Pulse: 82 68 70 72  Resp: 18 18 18 18   Temp: 98.3 F (36.8 C) 98.4 F (36.9 C) 98.3 F (36.8 C) 98.2 F (36.8 C)  TempSrc: Oral Oral Oral Oral  SpO2: 96% 96% 96% 94%  Weight:      Height:        NAD ABD soft Sensation intact distally Intact pulses distally Dorsiflexion/Plantar flexion intact Incision: dressing C/D/I Compartment soft VAC intact   Lab Results  Component Value Date   WBC 9.9 11/27/2016   HGB 7.2 (L) 11/27/2016   HCT 22.9 (L) 11/27/2016   MCV 82.7 11/27/2016   PLT 289 11/27/2016   BMET    Component Value Date/Time   NA 134 (L) 11/25/2016 0845   K 4.0 11/25/2016 0845   CL 104 11/25/2016 0845   CO2 22 11/25/2016 0845   GLUCOSE 172 (H) 11/25/2016 0845   BUN 17 11/25/2016 0845   CREATININE 0.98 11/25/2016 0845   CREATININE 0.69 10/31/2016 1453   CALCIUM 8.2 (L) 11/25/2016 0845   GFRNONAA 51 (L) 11/25/2016 0845   GFRAA 59 (L) 11/25/2016 0845     Recent Results (from the past 240 hour(s))  Aerobic/Anaerobic Culture (surgical/deep wound)     Status: None (Preliminary result)   Collection Time: 11/24/16  8:18 AM  Result Value Ref Range Status   Specimen Description TISSUE RIGHT KNEE  Final   Special Requests PSEUDOCAPSULE POF VANC  Final   Gram Stain   Final    NO WBC SEEN NO ORGANISMS SEEN GRAM STAIN REVIEWED-AGREE WITH RESULT J BEAMAN    Culture   Final    NO GROWTH 2 DAYS NO ANAEROBES ISOLATED; CULTURE IN PROGRESS FOR 5 DAYS   Report Status PENDING  Incomplete  Aerobic/Anaerobic Culture (surgical/deep wound)     Status: None (Preliminary result)   Collection Time: 11/24/16  8:45 AM  Result Value Ref Range Status   Specimen Description TISSUE  Final   Special Requests FEMORAL INTERFACE MEMBRANE  Final   Gram Stain NO WBC  SEEN NO ORGANISMS SEEN   Final   Culture   Final    NO GROWTH 2 DAYS NO ANAEROBES ISOLATED; CULTURE IN PROGRESS FOR 5 DAYS   Report Status PENDING  Incomplete  Aerobic/Anaerobic Culture (surgical/deep wound)     Status: None (Preliminary result)   Collection Time: 11/24/16  8:51 AM  Result Value Ref Range Status   Specimen Description TISSUE  Final   Special Requests TIBIAL INTERFACE MEMBRANE  Final   Gram Stain NO WBC SEEN NO ORGANISMS SEEN   Final   Culture   Final    NO GROWTH 2 DAYS NO ANAEROBES ISOLATED; CULTURE IN PROGRESS FOR 5 DAYS   Report Status PENDING  Incomplete      Assessment/Plan: 3 Days Post-Op   Principal Problem:   Infection of total right knee replacement (HCC) Active Problems:   History of infection of total joint prosthesis of knee   WBAT RLE with walker DVT ppx: lovenox, SCDs, TEDs PO pain control PT/OT Follow intraop cultures: NGTD, cont IV ancef ABLA: asymptomatic, transfuse for hgb < 7, recheck in am Prevena VAC Dispo: plan for D/C to SNF tomorrow   Dawn Lawson, Horald Pollen 11/27/2016, 12:10 PM   Rod Can, MD Cell (  [phone]  

## 2016-11-28 ENCOUNTER — Encounter (HOSPITAL_COMMUNITY): Payer: Self-pay | Admitting: Orthopedic Surgery

## 2016-11-28 DIAGNOSIS — T8450XD Infection and inflammatory reaction due to unspecified internal joint prosthesis, subsequent encounter: Secondary | ICD-10-CM | POA: Diagnosis not present

## 2016-11-28 DIAGNOSIS — E559 Vitamin D deficiency, unspecified: Secondary | ICD-10-CM | POA: Diagnosis not present

## 2016-11-28 DIAGNOSIS — I10 Essential (primary) hypertension: Secondary | ICD-10-CM | POA: Diagnosis not present

## 2016-11-28 DIAGNOSIS — D509 Iron deficiency anemia, unspecified: Secondary | ICD-10-CM | POA: Diagnosis not present

## 2016-11-28 DIAGNOSIS — K5900 Constipation, unspecified: Secondary | ICD-10-CM | POA: Diagnosis not present

## 2016-11-28 DIAGNOSIS — Z4733 Aftercare following explantation of knee joint prosthesis: Secondary | ICD-10-CM | POA: Diagnosis not present

## 2016-11-28 DIAGNOSIS — E669 Obesity, unspecified: Secondary | ICD-10-CM | POA: Diagnosis not present

## 2016-11-28 DIAGNOSIS — Z87440 Personal history of urinary (tract) infections: Secondary | ICD-10-CM | POA: Diagnosis not present

## 2016-11-28 DIAGNOSIS — B999 Unspecified infectious disease: Secondary | ICD-10-CM | POA: Diagnosis not present

## 2016-11-28 DIAGNOSIS — T8459XD Infection and inflammatory reaction due to other internal joint prosthesis, subsequent encounter: Secondary | ICD-10-CM | POA: Diagnosis not present

## 2016-11-28 DIAGNOSIS — Z96651 Presence of right artificial knee joint: Secondary | ICD-10-CM | POA: Diagnosis not present

## 2016-11-28 DIAGNOSIS — Z4789 Encounter for other orthopedic aftercare: Secondary | ICD-10-CM | POA: Diagnosis not present

## 2016-11-28 DIAGNOSIS — M25569 Pain in unspecified knee: Secondary | ICD-10-CM | POA: Diagnosis not present

## 2016-11-28 DIAGNOSIS — T8453XD Infection and inflammatory reaction due to internal right knee prosthesis, subsequent encounter: Secondary | ICD-10-CM | POA: Diagnosis not present

## 2016-11-28 DIAGNOSIS — R296 Repeated falls: Secondary | ICD-10-CM | POA: Diagnosis not present

## 2016-11-28 DIAGNOSIS — M1990 Unspecified osteoarthritis, unspecified site: Secondary | ICD-10-CM | POA: Diagnosis not present

## 2016-11-28 LAB — CBC
HCT: 24.2 % — ABNORMAL LOW (ref 36.0–46.0)
HEMOGLOBIN: 7.7 g/dL — AB (ref 12.0–15.0)
MCH: 26.5 pg (ref 26.0–34.0)
MCHC: 31.8 g/dL (ref 30.0–36.0)
MCV: 83.2 fL (ref 78.0–100.0)
PLATELETS: 313 10*3/uL (ref 150–400)
RBC: 2.91 MIL/uL — AB (ref 3.87–5.11)
RDW: 16.7 % — ABNORMAL HIGH (ref 11.5–15.5)
WBC: 8.8 10*3/uL (ref 4.0–10.5)

## 2016-11-28 LAB — BASIC METABOLIC PANEL
Anion gap: 12 (ref 5–15)
BUN: 12 mg/dL (ref 6–20)
CHLORIDE: 101 mmol/L (ref 101–111)
CO2: 26 mmol/L (ref 22–32)
Calcium: 9 mg/dL (ref 8.9–10.3)
Creatinine, Ser: 0.69 mg/dL (ref 0.44–1.00)
Glucose, Bld: 107 mg/dL — ABNORMAL HIGH (ref 65–99)
Potassium: 4 mmol/L (ref 3.5–5.1)
SODIUM: 139 mmol/L (ref 135–145)

## 2016-11-28 NOTE — Progress Notes (Signed)
   Subjective:  Patient reports pain as moderate.  Denies N/V/CP/SOB.  Objective:   VITALS:   Vitals:   11/27/16 0427 11/27/16 1500 11/27/16 2211 11/28/16 0357  BP: (!) 152/46 (!) 154/44 (!) 151/46 (!) 149/42  Pulse: 72 74 82 71  Resp: 18 18 18 16   Temp: 98.2 F (36.8 C) 98.1 F (36.7 C) 98.9 F (37.2 C) 98.3 F (36.8 C)  TempSrc: Oral Oral Oral Oral  SpO2: 94% 96% 94% 96%  Weight:      Height:        NAD ABD soft Sensation intact distally Intact pulses distally Dorsiflexion/Plantar flexion intact Incision: dressing C/D/I Compartment soft VAC intact   Lab Results  Component Value Date   WBC 8.8 11/28/2016   HGB 7.7 (L) 11/28/2016   HCT 24.2 (L) 11/28/2016   MCV 83.2 11/28/2016   PLT 313 11/28/2016   BMET    Component Value Date/Time   NA 134 (L) 11/25/2016 0845   K 4.0 11/25/2016 0845   CL 104 11/25/2016 0845   CO2 22 11/25/2016 0845   GLUCOSE 172 (H) 11/25/2016 0845   BUN 17 11/25/2016 0845   CREATININE 0.98 11/25/2016 0845   CREATININE 0.69 10/31/2016 1453   CALCIUM 8.2 (L) 11/25/2016 0845   GFRNONAA 51 (L) 11/25/2016 0845   GFRAA 59 (L) 11/25/2016 0845     Recent Results (from the past 240 hour(s))  Aerobic/Anaerobic Culture (surgical/deep wound)     Status: None (Preliminary result)   Collection Time: 11/24/16  8:18 AM  Result Value Ref Range Status   Specimen Description TISSUE RIGHT KNEE  Final   Special Requests PSEUDOCAPSULE POF VANC  Final   Gram Stain   Final    NO WBC SEEN NO ORGANISMS SEEN GRAM STAIN REVIEWED-AGREE WITH RESULT J BEAMAN    Culture   Final    NO GROWTH 2 DAYS NO ANAEROBES ISOLATED; CULTURE IN PROGRESS FOR 5 DAYS   Report Status PENDING  Incomplete  Aerobic/Anaerobic Culture (surgical/deep wound)     Status: None (Preliminary result)   Collection Time: 11/24/16  8:45 AM  Result Value Ref Range Status   Specimen Description TISSUE  Final   Special Requests FEMORAL INTERFACE MEMBRANE  Final   Gram Stain NO WBC  SEEN NO ORGANISMS SEEN   Final   Culture   Final    NO GROWTH 2 DAYS NO ANAEROBES ISOLATED; CULTURE IN PROGRESS FOR 5 DAYS   Report Status PENDING  Incomplete  Aerobic/Anaerobic Culture (surgical/deep wound)     Status: None (Preliminary result)   Collection Time: 11/24/16  8:51 AM  Result Value Ref Range Status   Specimen Description TISSUE  Final   Special Requests TIBIAL INTERFACE MEMBRANE  Final   Gram Stain NO WBC SEEN NO ORGANISMS SEEN   Final   Culture   Final    NO GROWTH 2 DAYS NO ANAEROBES ISOLATED; CULTURE IN PROGRESS FOR 5 DAYS   Report Status PENDING  Incomplete      Assessment/Plan: 4 Days Post-Op   Principal Problem:   Infection of total right knee replacement (HCC) Active Problems:   History of infection of total joint prosthesis of knee   WBAT RLE with walker DVT ppx: lovenox, SCDs, TEDs PO pain control PT/OT Follow intraop cultures: NGTD, cont keflex ABLA: asymptomatic, tmonitor Prevena VAC Dispo: D/C to SNF   Denzil Mceachron, Horald Pollen 11/28/2016, 8:10 AM   Rod Can, MD Cell [phone]

## 2016-11-28 NOTE — Clinical Social Work Note (Signed)
Pt is ready for discharge today and will return to [facility]. Pt and family are aware and agreeable to discharge plan. Facility has received discharge information and is ready to admit pt. RN will call report. [facility] will provide transportation. CSW is signing off as no further needs identified.   Darden Dates, MSW, LCSW  Clinical Social Worker  [phone]

## 2016-11-28 NOTE — Care Management Important Message (Signed)
Important Message  Patient Details  Name: Dawn Lawson MRN: [id number] Date of Birth: [birth year]   Medicare Important Message Given:  Yes    Nithin Demeo 11/28/2016, 1:08 PM

## 2016-11-28 NOTE — Discharge Summary (Signed)
Physician Discharge Summary  Patient ID: Dawn Lawson MRN: [id number] DOB/AGE: May 28, 1929 83 y.o.  Admit date: 11/24/2016 Discharge date: 11/28/2016  Admission Diagnoses:  Infection of total right knee replacement [facility])  Discharge Diagnoses:  Principal Problem:   Infection of total right knee replacement ([place]) Active Problems:   History of infection of total joint prosthesis of knee   Past Medical History:  Diagnosis Date  . AKI (acute kidney injury) ([place]) 03/22/2014  . Anemia   . Arthritis   . ATN (acute tubular necrosis) ([place]) 03/22/2014  . Cataract   . Heart murmur   . Hypertension   . Lactic acidosis 03/22/2014  . PONV (postoperative nausea and vomiting)   . Pre-diabetes   . Sepsis due to group B Streptococcus ([place]) 04/14/2014  . Severe sepsis with acute organ dysfunction ([place]) 03/22/2014  . Status post PICC central line placement 04/14/2014  . Urinary tract infection 03/22/2014    Surgeries: Procedure(s): Removal of right knee spacer, Right knee arthrodesis on 11/24/2016   Consultants (if any):   Discharged Condition: Improved  Hospital Course: Dawn Lawson is an 83 y.o. female who was admitted 11/24/2016 with a diagnosis of Infection of total right knee replacement ([place]) and went to the operating room on 11/24/2016 and underwent the above named procedures.    She was given perioperative antibiotics:  Anti-infectives    Start     Dose/Rate Route Frequency Ordered Stop   11/27/16 0000  cephALEXin (KEFLEX) 500 MG capsule     500 mg Oral 2 times daily 11/27/16 1214     11/26/16 2200  cephALEXin (KEFLEX) capsule 500 mg     500 mg Oral Every 8 hours 11/26/16 2047 11/27/16 0644   11/24/16 1400  ceFAZolin (ANCEF) IVPB 2g/100 mL premix  Status:  Discontinued     2 g 200 mL/hr over 30 Minutes Intravenous Every 8 hours 11/24/16 1322 11/26/16 2047   11/24/16 0815  vancomycin (VANCOCIN) powder 7,000 mg     7,000 mg Other To Surgery 11/24/16 0812 11/24/16 0909   11/24/16 0730   vancomycin (VANCOCIN) 1,500 mg in sodium chloride 0.9 % 500 mL IVPB     1,500 mg 250 mL/hr over 120 Minutes Intravenous On call to O.R. 11/24/16 0715 11/24/16 0926   11/24/16 0708  vancomycin (VANCOCIN) 1-5 GM/200ML-% IVPB  Status:  Discontinued    Comments:  Sampson Si   : cabinet override      11/24/16 0708 11/24/16 0712   11/23/16 1033  vancomycin (VANCOCIN) 1,500 mg in sodium chloride 0.9 % 500 mL IVPB  Status:  Discontinued     1,500 mg 250 mL/hr over 120 Minutes Intravenous On call to O.R. 11/23/16 1033 11/23/16 1640    .  She was given sequential compression devices, early ambulation, and lovenox for DVT prophylaxis.  Cultures were negative.  She benefited maximally from the hospital stay and there were no complications.    Recent vital signs:  Vitals:   11/27/16 2211 11/28/16 0357  BP: (!) 151/46 (!) 149/42  Pulse: 82 71  Resp: 18 16  Temp: 98.9 F (37.2 C) 98.3 F (36.8 C)    Recent laboratory studies:  Lab Results  Component Value Date   HGB 7.7 (L) 11/28/2016   HGB 7.2 (L) 11/27/2016   HGB 7.3 (L) 11/26/2016   Lab Results  Component Value Date   WBC 8.8 11/28/2016   PLT 313 11/28/2016   Lab Results  Component Value Date   INR 1.92 SPECIMEN  CHECKED FOR CLOTS (H) 01/13/2011   Lab Results  Component Value Date   NA 139 11/28/2016   K 4.0 11/28/2016   CL 101 11/28/2016   CO2 26 11/28/2016   BUN 12 11/28/2016   CREATININE 0.69 11/28/2016   GLUCOSE 107 (H) 11/28/2016    Discharge Medications:   Allergies as of 11/28/2016      Reactions   Penicillins Hives, Itching   Tolerated Ancef Nov 2017.  TDD Has patient had a PCN reaction causing immediate rash, facial/tongue/throat swelling, SOB or lightheadedness with hypotension: NO Has patient had a PCN reaction causing severe rash involving mucus membranes or skin necrosis: {NO Has patient had a PCN reaction that required hospitalization {NO Has patient had a PCN reaction occurring within the last 10  years:  # # YES # #  If all of the above answers are "NO", then may proceed with Cephalosporin use.   Ampicillin Hives, Itching      Medication List    TAKE these medications   aspirin EC 81 MG tablet Take 81 mg by mouth daily.   bisoprolol-hydrochlorothiazide 2.5-6.25 MG tablet Commonly known as:  ZIAC Take 1 tablet by mouth daily.   cephALEXin 500 MG capsule Commonly known as:  KEFLEX Take 1 capsule (500 mg total) by mouth 2 (two) times daily.   docusate sodium 100 MG capsule Commonly known as:  COLACE Take 100 mg by mouth 2 (two) times daily as needed for mild constipation.   enoxaparin 40 MG/0.4ML injection Commonly known as:  LOVENOX Inject 0.4 mLs (40 mg total) into the skin every 12 (twelve) hours. What changed:  when to take this   ENSURE Take 237 mLs by mouth 2 (two) times daily between meals.   ferrous sulfate 325 (65 FE) MG tablet Take 325 mg by mouth daily with breakfast.   HYDROcodone-acetaminophen 5-325 MG tablet Commonly known as:  NORCO/VICODIN Take 1-2 tablets by mouth every 4 (four) hours as needed (breakthrough pain). What changed:  reasons to take this  Another medication with the same name was removed. Continue taking this medication, and follow the directions you see here.   MULTIVITAMIN PO Take 1 tablet by mouth daily.   PRESERVISION AREDS PO Take 1 capsule by mouth 2 (two) times daily.   ondansetron 4 MG tablet Commonly known as:  ZOFRAN Take 4 mg by mouth every 6 (six) hours as needed for nausea or vomiting.   polyethylene glycol packet Commonly known as:  MIRALAX / GLYCOLAX Take 17 g by mouth daily as needed for mild constipation.   polyvinyl alcohol 1.4 % ophthalmic solution Commonly known as:  LIQUIFILM TEARS Place 1 drop into both eyes as needed for dry eyes.   senna 8.6 MG Tabs tablet Commonly known as:  SENOKOT Take 1 tablet by mouth 2 (two) times daily as needed for mild constipation.   Vitamin D (Ergocalciferol) 50000  units Caps capsule Commonly known as:  DRISDOL Take 50,000 Units by mouth every Saturday.       Diagnostic Studies: Dg Knee Right Port  Result Date: 11/24/2016 CLINICAL DATA:  Right knee surgery. EXAM: PORTABLE RIGHT KNEE - 1-2 VIEW COMPARISON:  09/18/2016 . FINDINGS: Postsurgical changes are noted about the right knee. Methylmethacrylate beads noted about the knee joint space. Hardware intact. Anatomic alignment. IMPRESSION: Postsurgical changes right knee. Electronically Signed   By: Marcello Moores  Register   On: 11/24/2016 12:54   Dg Tibia/fibula Right Port  Result Date: 11/24/2016 CLINICAL DATA:  Postop check. EXAM:  PORTABLE RIGHT TIBIA AND FIBULA - 2 VIEW COMPARISON:  September 18, 2016 FINDINGS: The distal knee hardware is in good position. The bones are unremarkable. Antibiotic beads are identified. The femoral component of the hardware is also normal within visualized limits. IMPRESSION: Postoperative changes as above. Electronically Signed   By: Dorise Bullion III M.D   On: 11/24/2016 13:13    Disposition: 03-Skilled Nursing Facility  Discharge Instructions    Call MD / Call 911    Complete by:  As directed    If you experience chest pain or shortness of breath, CALL 911 and be transported to the hospital emergency room.  If you develope a fever above 101 F, pus (white drainage) or increased drainage or redness at the wound, or calf pain, call your surgeon's office.   Constipation Prevention    Complete by:  As directed    Drink plenty of fluids.  Prune juice may be helpful.  You may use a stool softener, such as Colace (over the counter) 100 mg twice a day.  Use MiraLax (over the counter) for constipation as needed.   Diet - low sodium heart healthy    Complete by:  As directed    Discharge instructions    Complete by:  As directed    Weight bearing as tolerated with a walker Keep wound VAC clean and dry   Increase activity slowly as tolerated    Complete by:  As directed        Follow-up Information    Odalys Win, Horald Pollen, MD. Schedule an appointment as soon as possible for a visit on 12/05/2016.   Specialty:  Orthopedic Surgery Why:  For wound re-check Contact information: [place]. [address] [address] [phone]            Signed: Elie Goody 11/28/2016, 8:11 AM

## 2016-11-28 NOTE — Progress Notes (Signed)
Physical Therapy Treatment Patient Details Name: Dawn Lawson MRN: [id number] DOB: [birth year] Today's Date: 11/28/2016    History of Present Illness Dawn Lawson, 83 y.o.female,has a history of pain and functional disability in the rightknee(s)due toInfected previous arthroplasty. The indications for the revisionof thetotal knee arthroplasty arehistory of total knee infection. Pt s/p resection of R TKA placement of antibiotic spacer and application of wound vac.    PT Comments    Pt requiring additional assistance for bed mobility and transfers. Pt fatigued by end of session due to additional standing time needed due to incontinence of bowel and bladder. Continuing to recommend SNF for further rehabilitation following acute stay.   Follow Up Recommendations  SNF;Supervision for mobility/OOB     Equipment Recommendations  None recommended by PT    Recommendations for Other Services       Precautions / Restrictions Precautions Precautions: Fall Precaution Comments: static antibiotic spacer Restrictions Weight Bearing Restrictions: Yes RLE Weight Bearing: Weight bearing as tolerated    Mobility  Bed Mobility Overal bed mobility: Needs Assistance Bed Mobility: Supine to Sit     Supine to sit: Max assist     General bed mobility comments: assist needed with trunk and LEs, pt assisting with use of rail  Transfers Overall transfer level: Needs assistance Equipment used: Rolling walker (2 wheeled) Transfers: Sit to/from Stand Sit to Stand: Mod assist         General transfer comment: Repeat X2, cues for hand placement. Pt incontinent of bowel and bladder, additional standing time needed for cleaning.   Ambulation/Gait Ambulation/Gait assistance: Min assist Ambulation Distance (Feet): 8 Feet Assistive device: Rolling walker (2 wheeled) Gait Pattern/deviations: Step-to pattern;Decreased stance time - right;Decreased weight shift to right Gait velocity: slow  pattern   General Gait Details: slow pattern   Stairs            Wheelchair Mobility    Modified Rankin (Stroke Patients Only)       Balance Overall balance assessment: Needs assistance Sitting-balance support: No upper extremity supported Sitting balance-Leahy Scale: Fair     Standing balance support: Bilateral upper extremity supported Standing balance-Leahy Scale: Poor Standing balance comment: using rw for support                    Cognition Arousal/Alertness: Awake/alert Behavior During Therapy: WFL for tasks assessed/performed Overall Cognitive Status: Within Functional Limits for tasks assessed                      Exercises      General Comments        Pertinent Vitals/Pain Pain Assessment: Faces (no pain at rest) Faces Pain Scale: Hurts even more (with mobility) Pain Location: Rt knee Pain Descriptors / Indicators: Grimacing;Guarding Pain Intervention(s): Limited activity within patient's tolerance;Monitored during session    Home Living                      Prior Function            PT Goals (current goals can now be found in the care plan section) Acute Rehab PT Goals Patient Stated Goal: go home PT Goal Formulation: With patient Time For Goal Achievement: 12/02/16 Potential to Achieve Goals: Good Progress towards PT goals: Progressing toward goals    Frequency    7X/week      PT Plan Current plan remains appropriate    Co-evaluation  End of Session Equipment Utilized During Treatment: Gait belt Activity Tolerance: Patient limited by fatigue Patient left: in chair;with call bell/phone within reach     Time: [id number] PT Time Calculation (min) (ACUTE ONLY): 30 min  Charges:  $Therapeutic Activity: 23-37 mins                    G Codes:      Cassell Clement, PT, CSCS Pager [phone] Office [phone]  11/28/2016, 8:49 AM

## 2016-11-29 DIAGNOSIS — K5900 Constipation, unspecified: Secondary | ICD-10-CM | POA: Diagnosis not present

## 2016-11-29 DIAGNOSIS — R296 Repeated falls: Secondary | ICD-10-CM | POA: Diagnosis not present

## 2016-11-29 DIAGNOSIS — I10 Essential (primary) hypertension: Secondary | ICD-10-CM | POA: Diagnosis not present

## 2016-11-29 DIAGNOSIS — D509 Iron deficiency anemia, unspecified: Secondary | ICD-10-CM | POA: Diagnosis not present

## 2016-11-29 DIAGNOSIS — T8453XD Infection and inflammatory reaction due to internal right knee prosthesis, subsequent encounter: Secondary | ICD-10-CM | POA: Diagnosis not present

## 2016-11-29 LAB — AEROBIC/ANAEROBIC CULTURE (SURGICAL/DEEP WOUND)
CULTURE: NO GROWTH
CULTURE: NO GROWTH
GRAM STAIN: NONE SEEN
GRAM STAIN: NONE SEEN

## 2016-11-29 LAB — AEROBIC/ANAEROBIC CULTURE W GRAM STAIN (SURGICAL/DEEP WOUND)
Culture: NO GROWTH
Gram Stain: NONE SEEN

## 2016-12-05 DIAGNOSIS — Z96651 Presence of right artificial knee joint: Secondary | ICD-10-CM | POA: Diagnosis not present

## 2016-12-05 DIAGNOSIS — Z4789 Encounter for other orthopedic aftercare: Secondary | ICD-10-CM | POA: Diagnosis not present

## 2016-12-05 DIAGNOSIS — T8459XD Infection and inflammatory reaction due to other internal joint prosthesis, subsequent encounter: Secondary | ICD-10-CM | POA: Diagnosis not present

## 2016-12-08 ENCOUNTER — Encounter (HOSPITAL_COMMUNITY): Payer: Self-pay | Admitting: Orthopedic Surgery

## 2016-12-14 DIAGNOSIS — Z96651 Presence of right artificial knee joint: Secondary | ICD-10-CM | POA: Diagnosis not present

## 2016-12-14 DIAGNOSIS — T8459XD Infection and inflammatory reaction due to other internal joint prosthesis, subsequent encounter: Secondary | ICD-10-CM | POA: Diagnosis not present

## 2016-12-14 DIAGNOSIS — Z4789 Encounter for other orthopedic aftercare: Secondary | ICD-10-CM | POA: Diagnosis not present

## 2016-12-21 DIAGNOSIS — E559 Vitamin D deficiency, unspecified: Secondary | ICD-10-CM | POA: Diagnosis not present

## 2016-12-21 DIAGNOSIS — Z4733 Aftercare following explantation of knee joint prosthesis: Secondary | ICD-10-CM | POA: Diagnosis not present

## 2016-12-21 DIAGNOSIS — I10 Essential (primary) hypertension: Secondary | ICD-10-CM | POA: Diagnosis not present

## 2016-12-28 DIAGNOSIS — M1990 Unspecified osteoarthritis, unspecified site: Secondary | ICD-10-CM | POA: Diagnosis not present

## 2016-12-28 DIAGNOSIS — D649 Anemia, unspecified: Secondary | ICD-10-CM | POA: Diagnosis not present

## 2016-12-28 DIAGNOSIS — H3530 Unspecified macular degeneration: Secondary | ICD-10-CM | POA: Diagnosis not present

## 2016-12-28 DIAGNOSIS — I2510 Atherosclerotic heart disease of native coronary artery without angina pectoris: Secondary | ICD-10-CM | POA: Diagnosis not present

## 2016-12-28 DIAGNOSIS — T8453XA Infection and inflammatory reaction due to internal right knee prosthesis, initial encounter: Secondary | ICD-10-CM | POA: Diagnosis not present

## 2016-12-28 DIAGNOSIS — I10 Essential (primary) hypertension: Secondary | ICD-10-CM | POA: Diagnosis not present

## 2017-01-02 DIAGNOSIS — T8453XA Infection and inflammatory reaction due to internal right knee prosthesis, initial encounter: Secondary | ICD-10-CM | POA: Diagnosis not present

## 2017-01-02 DIAGNOSIS — H3530 Unspecified macular degeneration: Secondary | ICD-10-CM | POA: Diagnosis not present

## 2017-01-02 DIAGNOSIS — I10 Essential (primary) hypertension: Secondary | ICD-10-CM | POA: Diagnosis not present

## 2017-01-02 DIAGNOSIS — I2510 Atherosclerotic heart disease of native coronary artery without angina pectoris: Secondary | ICD-10-CM | POA: Diagnosis not present

## 2017-01-02 DIAGNOSIS — M1990 Unspecified osteoarthritis, unspecified site: Secondary | ICD-10-CM | POA: Diagnosis not present

## 2017-01-02 DIAGNOSIS — D649 Anemia, unspecified: Secondary | ICD-10-CM | POA: Diagnosis not present

## 2017-01-03 DIAGNOSIS — M1990 Unspecified osteoarthritis, unspecified site: Secondary | ICD-10-CM | POA: Diagnosis not present

## 2017-01-03 DIAGNOSIS — H3530 Unspecified macular degeneration: Secondary | ICD-10-CM | POA: Diagnosis not present

## 2017-01-03 DIAGNOSIS — T8453XA Infection and inflammatory reaction due to internal right knee prosthesis, initial encounter: Secondary | ICD-10-CM | POA: Diagnosis not present

## 2017-01-03 DIAGNOSIS — I2510 Atherosclerotic heart disease of native coronary artery without angina pectoris: Secondary | ICD-10-CM | POA: Diagnosis not present

## 2017-01-03 DIAGNOSIS — I10 Essential (primary) hypertension: Secondary | ICD-10-CM | POA: Diagnosis not present

## 2017-01-03 DIAGNOSIS — D649 Anemia, unspecified: Secondary | ICD-10-CM | POA: Diagnosis not present

## 2017-01-05 DIAGNOSIS — M1990 Unspecified osteoarthritis, unspecified site: Secondary | ICD-10-CM | POA: Diagnosis not present

## 2017-01-05 DIAGNOSIS — I2510 Atherosclerotic heart disease of native coronary artery without angina pectoris: Secondary | ICD-10-CM | POA: Diagnosis not present

## 2017-01-05 DIAGNOSIS — T8453XA Infection and inflammatory reaction due to internal right knee prosthesis, initial encounter: Secondary | ICD-10-CM | POA: Diagnosis not present

## 2017-01-05 DIAGNOSIS — I10 Essential (primary) hypertension: Secondary | ICD-10-CM | POA: Diagnosis not present

## 2017-01-05 DIAGNOSIS — H3530 Unspecified macular degeneration: Secondary | ICD-10-CM | POA: Diagnosis not present

## 2017-01-05 DIAGNOSIS — D649 Anemia, unspecified: Secondary | ICD-10-CM | POA: Diagnosis not present

## 2017-01-08 DIAGNOSIS — I2510 Atherosclerotic heart disease of native coronary artery without angina pectoris: Secondary | ICD-10-CM | POA: Diagnosis not present

## 2017-01-08 DIAGNOSIS — T8453XA Infection and inflammatory reaction due to internal right knee prosthesis, initial encounter: Secondary | ICD-10-CM | POA: Diagnosis not present

## 2017-01-08 DIAGNOSIS — I10 Essential (primary) hypertension: Secondary | ICD-10-CM | POA: Diagnosis not present

## 2017-01-08 DIAGNOSIS — M1990 Unspecified osteoarthritis, unspecified site: Secondary | ICD-10-CM | POA: Diagnosis not present

## 2017-01-08 DIAGNOSIS — D649 Anemia, unspecified: Secondary | ICD-10-CM | POA: Diagnosis not present

## 2017-01-08 DIAGNOSIS — H3530 Unspecified macular degeneration: Secondary | ICD-10-CM | POA: Diagnosis not present

## 2017-01-09 DIAGNOSIS — M1990 Unspecified osteoarthritis, unspecified site: Secondary | ICD-10-CM | POA: Diagnosis not present

## 2017-01-09 DIAGNOSIS — H3530 Unspecified macular degeneration: Secondary | ICD-10-CM | POA: Diagnosis not present

## 2017-01-09 DIAGNOSIS — I10 Essential (primary) hypertension: Secondary | ICD-10-CM | POA: Diagnosis not present

## 2017-01-09 DIAGNOSIS — T8453XA Infection and inflammatory reaction due to internal right knee prosthesis, initial encounter: Secondary | ICD-10-CM | POA: Diagnosis not present

## 2017-01-09 DIAGNOSIS — D649 Anemia, unspecified: Secondary | ICD-10-CM | POA: Diagnosis not present

## 2017-01-09 DIAGNOSIS — I2510 Atherosclerotic heart disease of native coronary artery without angina pectoris: Secondary | ICD-10-CM | POA: Diagnosis not present

## 2017-01-10 DIAGNOSIS — I10 Essential (primary) hypertension: Secondary | ICD-10-CM | POA: Diagnosis not present

## 2017-01-10 DIAGNOSIS — M1990 Unspecified osteoarthritis, unspecified site: Secondary | ICD-10-CM | POA: Diagnosis not present

## 2017-01-10 DIAGNOSIS — T8453XA Infection and inflammatory reaction due to internal right knee prosthesis, initial encounter: Secondary | ICD-10-CM | POA: Diagnosis not present

## 2017-01-10 DIAGNOSIS — I2510 Atherosclerotic heart disease of native coronary artery without angina pectoris: Secondary | ICD-10-CM | POA: Diagnosis not present

## 2017-01-10 DIAGNOSIS — D649 Anemia, unspecified: Secondary | ICD-10-CM | POA: Diagnosis not present

## 2017-01-10 DIAGNOSIS — H3530 Unspecified macular degeneration: Secondary | ICD-10-CM | POA: Diagnosis not present

## 2017-01-11 DIAGNOSIS — Z96651 Presence of right artificial knee joint: Secondary | ICD-10-CM | POA: Diagnosis not present

## 2017-01-11 DIAGNOSIS — T8459XD Infection and inflammatory reaction due to other internal joint prosthesis, subsequent encounter: Secondary | ICD-10-CM | POA: Diagnosis not present

## 2017-01-11 DIAGNOSIS — Z4789 Encounter for other orthopedic aftercare: Secondary | ICD-10-CM | POA: Diagnosis not present

## 2017-01-15 DIAGNOSIS — H3530 Unspecified macular degeneration: Secondary | ICD-10-CM | POA: Diagnosis not present

## 2017-01-15 DIAGNOSIS — T8453XA Infection and inflammatory reaction due to internal right knee prosthesis, initial encounter: Secondary | ICD-10-CM | POA: Diagnosis not present

## 2017-01-15 DIAGNOSIS — I2510 Atherosclerotic heart disease of native coronary artery without angina pectoris: Secondary | ICD-10-CM | POA: Diagnosis not present

## 2017-01-15 DIAGNOSIS — I10 Essential (primary) hypertension: Secondary | ICD-10-CM | POA: Diagnosis not present

## 2017-01-15 DIAGNOSIS — D649 Anemia, unspecified: Secondary | ICD-10-CM | POA: Diagnosis not present

## 2017-01-15 DIAGNOSIS — M1990 Unspecified osteoarthritis, unspecified site: Secondary | ICD-10-CM | POA: Diagnosis not present

## 2017-01-16 DIAGNOSIS — I10 Essential (primary) hypertension: Secondary | ICD-10-CM | POA: Diagnosis not present

## 2017-01-16 DIAGNOSIS — D649 Anemia, unspecified: Secondary | ICD-10-CM | POA: Diagnosis not present

## 2017-01-16 DIAGNOSIS — H3530 Unspecified macular degeneration: Secondary | ICD-10-CM | POA: Diagnosis not present

## 2017-01-16 DIAGNOSIS — I2510 Atherosclerotic heart disease of native coronary artery without angina pectoris: Secondary | ICD-10-CM | POA: Diagnosis not present

## 2017-01-16 DIAGNOSIS — T8453XA Infection and inflammatory reaction due to internal right knee prosthesis, initial encounter: Secondary | ICD-10-CM | POA: Diagnosis not present

## 2017-01-16 DIAGNOSIS — M1990 Unspecified osteoarthritis, unspecified site: Secondary | ICD-10-CM | POA: Diagnosis not present

## 2017-01-18 DIAGNOSIS — T8453XA Infection and inflammatory reaction due to internal right knee prosthesis, initial encounter: Secondary | ICD-10-CM | POA: Diagnosis not present

## 2017-01-18 DIAGNOSIS — I2510 Atherosclerotic heart disease of native coronary artery without angina pectoris: Secondary | ICD-10-CM | POA: Diagnosis not present

## 2017-01-18 DIAGNOSIS — M1990 Unspecified osteoarthritis, unspecified site: Secondary | ICD-10-CM | POA: Diagnosis not present

## 2017-01-18 DIAGNOSIS — D649 Anemia, unspecified: Secondary | ICD-10-CM | POA: Diagnosis not present

## 2017-01-18 DIAGNOSIS — H3530 Unspecified macular degeneration: Secondary | ICD-10-CM | POA: Diagnosis not present

## 2017-01-18 DIAGNOSIS — I10 Essential (primary) hypertension: Secondary | ICD-10-CM | POA: Diagnosis not present

## 2017-01-23 DIAGNOSIS — I2510 Atherosclerotic heart disease of native coronary artery without angina pectoris: Secondary | ICD-10-CM | POA: Diagnosis not present

## 2017-01-23 DIAGNOSIS — M1990 Unspecified osteoarthritis, unspecified site: Secondary | ICD-10-CM | POA: Diagnosis not present

## 2017-01-23 DIAGNOSIS — T8453XA Infection and inflammatory reaction due to internal right knee prosthesis, initial encounter: Secondary | ICD-10-CM | POA: Diagnosis not present

## 2017-01-23 DIAGNOSIS — D649 Anemia, unspecified: Secondary | ICD-10-CM | POA: Diagnosis not present

## 2017-01-23 DIAGNOSIS — I10 Essential (primary) hypertension: Secondary | ICD-10-CM | POA: Diagnosis not present

## 2017-01-23 DIAGNOSIS — H3530 Unspecified macular degeneration: Secondary | ICD-10-CM | POA: Diagnosis not present

## 2017-02-22 ENCOUNTER — Inpatient Hospital Stay (HOSPITAL_COMMUNITY)
Admission: EM | Admit: 2017-02-22 | Discharge: 2017-02-23 | DRG: 603 | Disposition: A | Discharge status: Home / Self Care | Payer: Medicare Other | Attending: Student in an Organized Health Care Education/Training Program | Admitting: Student in an Organized Health Care Education/Training Program

## 2017-02-22 ENCOUNTER — Encounter (HOSPITAL_COMMUNITY): Payer: Self-pay | Admitting: Emergency Medicine

## 2017-02-22 DIAGNOSIS — Z79899 Other long term (current) drug therapy: Secondary | ICD-10-CM | POA: Diagnosis not present

## 2017-02-22 DIAGNOSIS — R739 Hyperglycemia, unspecified: Secondary | ICD-10-CM | POA: Diagnosis present

## 2017-02-22 DIAGNOSIS — L03115 Cellulitis of right lower limb: Secondary | ICD-10-CM | POA: Diagnosis present

## 2017-02-22 DIAGNOSIS — Z888 Allergy status to other drugs, medicaments and biological substances status: Secondary | ICD-10-CM

## 2017-02-22 DIAGNOSIS — Z7982 Long term (current) use of aspirin: Secondary | ICD-10-CM

## 2017-02-22 DIAGNOSIS — L03119 Cellulitis of unspecified part of limb: Secondary | ICD-10-CM

## 2017-02-22 DIAGNOSIS — L02419 Cutaneous abscess of limb, unspecified: Secondary | ICD-10-CM

## 2017-02-22 DIAGNOSIS — L02415 Cutaneous abscess of right lower limb: Secondary | ICD-10-CM | POA: Diagnosis present

## 2017-02-22 DIAGNOSIS — T8459XD Infection and inflammatory reaction due to other internal joint prosthesis, subsequent encounter: Secondary | ICD-10-CM | POA: Diagnosis not present

## 2017-02-22 DIAGNOSIS — R6510 Systemic inflammatory response syndrome (SIRS) of non-infectious origin without acute organ dysfunction: Secondary | ICD-10-CM

## 2017-02-22 DIAGNOSIS — Z880 Allergy status to penicillin: Secondary | ICD-10-CM

## 2017-02-22 DIAGNOSIS — D649 Anemia, unspecified: Secondary | ICD-10-CM | POA: Diagnosis present

## 2017-02-22 DIAGNOSIS — T8453XA Infection and inflammatory reaction due to internal right knee prosthesis, initial encounter: Secondary | ICD-10-CM

## 2017-02-22 DIAGNOSIS — Z8249 Family history of ischemic heart disease and other diseases of the circulatory system: Secondary | ICD-10-CM

## 2017-02-22 DIAGNOSIS — Z96651 Presence of right artificial knee joint: Secondary | ICD-10-CM | POA: Diagnosis present

## 2017-02-22 DIAGNOSIS — I10 Essential (primary) hypertension: Secondary | ICD-10-CM | POA: Diagnosis present

## 2017-02-22 DIAGNOSIS — M1990 Unspecified osteoarthritis, unspecified site: Secondary | ICD-10-CM | POA: Diagnosis present

## 2017-02-22 DIAGNOSIS — D509 Iron deficiency anemia, unspecified: Secondary | ICD-10-CM | POA: Diagnosis present

## 2017-02-22 DIAGNOSIS — Z4789 Encounter for other orthopedic aftercare: Secondary | ICD-10-CM | POA: Diagnosis not present

## 2017-02-22 DIAGNOSIS — Z66 Do not resuscitate: Secondary | ICD-10-CM | POA: Diagnosis present

## 2017-02-22 LAB — URINALYSIS, ROUTINE W REFLEX MICROSCOPIC
BILIRUBIN URINE: NEGATIVE
Glucose, UA: NEGATIVE mg/dL
Ketones, ur: NEGATIVE mg/dL
LEUKOCYTES UA: NEGATIVE
Nitrite: NEGATIVE
Protein, ur: NEGATIVE mg/dL
RBC / HPF: NONE SEEN RBC/hpf (ref 0–5)
SPECIFIC GRAVITY, URINE: 1.004 — AB (ref 1.005–1.030)
pH: 7 (ref 5.0–8.0)

## 2017-02-22 LAB — CBC WITH DIFFERENTIAL/PLATELET
BASOS ABS: 0 10*3/uL (ref 0.0–0.1)
Basophils Relative: 0 %
EOS PCT: 1 %
Eosinophils Absolute: 0.1 10*3/uL (ref 0.0–0.7)
HEMATOCRIT: 31.4 % — AB (ref 36.0–46.0)
Hemoglobin: 9.9 g/dL — ABNORMAL LOW (ref 12.0–15.0)
LYMPHS ABS: 1.3 10*3/uL (ref 0.7–4.0)
LYMPHS PCT: 9 %
MCH: 26.5 pg (ref 26.0–34.0)
MCHC: 31.5 g/dL (ref 30.0–36.0)
MCV: 84 fL (ref 78.0–100.0)
MONO ABS: 1.4 10*3/uL — AB (ref 0.1–1.0)
MONOS PCT: 10 %
NEUTROS ABS: 12.1 10*3/uL — AB (ref 1.7–7.7)
Neutrophils Relative %: 80 %
Platelets: 311 10*3/uL (ref 150–400)
RBC: 3.74 MIL/uL — ABNORMAL LOW (ref 3.87–5.11)
RDW: 15.3 % (ref 11.5–15.5)
WBC: 15 10*3/uL — ABNORMAL HIGH (ref 4.0–10.5)

## 2017-02-22 LAB — COMPREHENSIVE METABOLIC PANEL
ALBUMIN: 2.8 g/dL — AB (ref 3.5–5.0)
ALT: 11 U/L — ABNORMAL LOW (ref 14–54)
ANION GAP: 11 (ref 5–15)
AST: 20 U/L (ref 15–41)
Alkaline Phosphatase: 67 U/L (ref 38–126)
BUN: 20 mg/dL (ref 6–20)
CHLORIDE: 99 mmol/L — AB (ref 101–111)
CO2: 23 mmol/L (ref 22–32)
Calcium: 8.8 mg/dL — ABNORMAL LOW (ref 8.9–10.3)
Creatinine, Ser: 0.93 mg/dL (ref 0.44–1.00)
GFR calc Af Amer: >60 mL/min (ref >60)
GFR calc non Af Amer: 54 mL/min — ABNORMAL LOW (ref >60)
GLUCOSE: 196 mg/dL — AB (ref 65–99)
POTASSIUM: 4.1 mmol/L (ref 3.5–5.1)
SODIUM: 133 mmol/L — AB (ref 135–145)
Total Bilirubin: 0.6 mg/dL (ref 0.3–1.2)
Total Protein: 7.5 g/dL (ref 6.5–8.1)

## 2017-02-22 LAB — I-STAT CG4 LACTIC ACID, ED
LACTIC ACID, VENOUS: 3.14 mmol/L — AB (ref 0.5–1.9)
Lactic Acid, Venous: 1.35 mmol/L (ref 0.5–1.9)

## 2017-02-22 MED ORDER — VANCOMYCIN HCL IN DEXTROSE 1-5 GM/200ML-% IV SOLN: 1000.0000 mg | INTRAVENOUS | Status: DC

## 2017-02-22 MED ORDER — VANCOMYCIN HCL IN DEXTROSE 1-5 GM/200ML-% IV SOLN: 1000.0000 mg | Freq: Once | INTRAVENOUS | Status: DC

## 2017-02-22 MED ORDER — TRAMADOL HCL 50 MG PO TABS
50.0000 mg | ORAL_TABLET | Freq: Four times a day (QID) | ORAL | Status: DC | PRN
  Administered 2017-02-22: 50 mg via ORAL
  Filled 2017-02-22: qty 1

## 2017-02-22 MED ORDER — ONDANSETRON HCL 4 MG PO TABS: 4.0000 mg | ORAL_TABLET | Freq: Four times a day (QID) | ORAL | Status: DC | PRN

## 2017-02-22 MED ORDER — ACETAMINOPHEN 500 MG PO TABS: 500.0000 mg | ORAL_TABLET | Freq: Four times a day (QID) | ORAL | Status: DC | PRN

## 2017-02-22 MED ORDER — METRONIDAZOLE IN NACL 5-0.79 MG/ML-% IV SOLN
500.0000 mg | Freq: Three times a day (TID) | INTRAVENOUS | Status: DC
  Filled 2017-02-22 (×2): qty 100

## 2017-02-22 MED ORDER — ASPIRIN EC 81 MG PO TBEC
81.0000 mg | DELAYED_RELEASE_TABLET | Freq: Every day | ORAL | Status: DC
  Administered 2017-02-23: 81 mg via ORAL
  Filled 2017-02-22: qty 1

## 2017-02-22 MED ORDER — FERROUS SULFATE 325 (65 FE) MG PO TABS
325.0000 mg | ORAL_TABLET | Freq: Every day | ORAL | Status: DC
  Administered 2017-02-23: 325 mg via ORAL
  Filled 2017-02-22: qty 1

## 2017-02-22 MED ORDER — POLYVINYL ALCOHOL 1.4 % OP SOLN
1.0000 [drp] | OPHTHALMIC | Status: DC | PRN
  Filled 2017-02-22: qty 15

## 2017-02-22 MED ORDER — METRONIDAZOLE IN NACL 5-0.79 MG/ML-% IV SOLN
500.0000 mg | Freq: Once | INTRAVENOUS | Status: AC
  Administered 2017-02-22: 500 mg via INTRAVENOUS
  Filled 2017-02-22: qty 100

## 2017-02-22 MED ORDER — SODIUM CHLORIDE 0.9 % IV BOLUS (SEPSIS)
1000.0000 mL | Freq: Once | INTRAVENOUS | Status: AC
  Administered 2017-02-22: 1000 mL via INTRAVENOUS

## 2017-02-22 MED ORDER — ENOXAPARIN SODIUM 40 MG/0.4ML ~~LOC~~ SOLN
40.0000 mg | SUBCUTANEOUS | Status: DC
  Administered 2017-02-22: 40 mg via SUBCUTANEOUS
  Filled 2017-02-22: qty 0.4

## 2017-02-22 MED ORDER — DEXTROSE 5 % IV SOLN
1.0000 g | Freq: Three times a day (TID) | INTRAVENOUS | Status: DC
  Filled 2017-02-22 (×2): qty 1

## 2017-02-22 MED ORDER — DOCUSATE SODIUM 100 MG PO CAPS
100.0000 mg | ORAL_CAPSULE | Freq: Two times a day (BID) | ORAL | Status: DC | PRN
Start: 2017-02-22 — End: 2017-02-23

## 2017-02-22 MED ORDER — LIDOCAINE-EPINEPHRINE (PF) 2 %-1:200000 IJ SOLN
10.0000 mL | Freq: Once | INTRAMUSCULAR | Status: AC
  Administered 2017-02-22: 10 mL via INTRADERMAL
  Filled 2017-02-22: qty 20

## 2017-02-22 MED ORDER — TEMAZEPAM 15 MG PO CAPS: 15.0000 mg | ORAL_CAPSULE | Freq: Every evening | ORAL | Status: DC | PRN

## 2017-02-22 MED ORDER — DEXTROSE 5 % IV SOLN
2.0000 g | Freq: Once | INTRAVENOUS | Status: AC
  Administered 2017-02-22: 2 g via INTRAVENOUS
  Filled 2017-02-22: qty 2

## 2017-02-22 MED ORDER — VANCOMYCIN HCL 10 G IV SOLR
1500.0000 mg | Freq: Once | INTRAVENOUS | Status: AC
  Administered 2017-02-22: 1500 mg via INTRAVENOUS
  Filled 2017-02-22: qty 1500

## 2017-02-22 MED ORDER — CEFEPIME HCL 2 G IJ SOLR
2.0000 g | INTRAMUSCULAR | Status: DC
  Administered 2017-02-22: 2 g via INTRAVENOUS
  Filled 2017-02-22 (×2): qty 2

## 2017-02-22 NOTE — ED Notes (Signed)
Hospitalist team at bedside.

## 2017-02-22 NOTE — ED Provider Notes (Signed)
[facility] DEPT Provider Note   CSN: [id number] Arrival date & time: 02/22/17  1038     History   Chief Complaint Chief Complaint  Patient presents with  . Cellulitis    HPI Dawn Lawson is a 83 y.o. female.  The history is provided by the patient and a relative (son). No language interpreter was used.   Dawn Lawson is a 83 y.o. female  with a PMH of HTN, prior severe sepsis in 2015 who presents to the Emergency Department complaining of Tender area of erythema to the upper right thigh which she first noticed on Sunday. Denies any injury or known bite. She had a knee replacement performed by orthopedics, Dr. Lyla Glassing, which became infected. She had to have a repeat washout procedure done with removal of right knee spacer in early February and was started on Keflex daily for prevention of infection. She has been taking Keflex daily as directed. She followed up with Dr. Lyla Glassing today who sent her to ER for IV ABX and further evaluation. Per son at bedside, orthopedics did not believe infection was related to knee. She denies any fever/chills, abdominal pain, n/v, chest pain or shortness of breath. No other medications taken prior to arrival for symptoms. Pain is worse with palpation and certain movements.   Past Medical History:  Diagnosis Date  . AKI (acute kidney injury) ([place]) 03/22/2014  . Anemia   . Arthritis   . ATN (acute tubular necrosis) ([place]) 03/22/2014  . Cataract   . Heart murmur   . Hypertension   . Lactic acidosis 03/22/2014  . PONV (postoperative nausea and vomiting)   . Pre-diabetes   . Sepsis due to group B Streptococcus ([place]) 04/14/2014  . Severe sepsis with acute organ dysfunction ([place]) 03/22/2014  . Status post PICC central line placement 04/14/2014  . Urinary tract infection 03/22/2014    Patient Active Problem List   Diagnosis Date Noted  . Infection of total right knee replacement ([place]) 09/18/2016  . Macular degeneration 10/14/2014  . Vitamin D  deficiency 10/14/2014  . HTN (hypertension) 10/14/2014  . Anemia 10/14/2014  . Obesity (BMI 30-39.9) 10/14/2014  . Arthritis 10/14/2014  . Cellulitis of right leg 03/22/2014    Past Surgical History:  Procedure Laterality Date  . ABDOMINAL HYSTERECTOMY     partial  . APPENDECTOMY    . APPLICATION OF WOUND VAC Right 09/18/2016   Procedure: APPLICATION OF WOUND VAC;  Surgeon: Rod Can, MD;  Location: [facility];  Service: Orthopedics;  Laterality: Right;  . EXCISIONAL TOTAL KNEE ARTHROPLASTY WITH ANTIBIOTIC SPACERS Right 09/18/2016   Procedure: RESECTION OF RIGHT TOTAL KNEE ARTHROPLASTY PLACEMENT OF ANTIBIOTIC SPACER;  Surgeon: Rod Can, MD;  Location: [facility];  Service: Orthopedics;  Laterality: Right;  . EYE SURGERY    . JOINT REPLACEMENT Right    KNEE  . KNEE HARDWARE REMOVAL Right 11/24/2016  . TEE WITHOUT CARDIOVERSION N/A 04/09/2014   Procedure: TRANSESOPHAGEAL ECHOCARDIOGRAM (TEE);  Surgeon: Lelon Perla, MD;  Location[facility] ENDOSCOPY;  Service: Cardiovascular;  Laterality: N/A;  . TOTAL KNEE ARTHROPLASTY WITH REVISION COMPONENTS Right 11/24/2016   Procedure: Removal of right knee spacer, Right knee arthrodesis;  Surgeon: Rod Can, MD;  Location: [facility];  Service: Orthopedics;  Laterality: Right;    OB History    No data available       Home Medications    Prior to Admission medications   Medication Sig Start Date End Date Taking? Authorizing Provider  acetaminophen (TYLENOL) 500  MG tablet Take 500-1,000 mg by mouth every 6 (six) hours as needed for moderate pain.   Yes Historical Provider, MD  aspirin EC 81 MG tablet Take 81 mg by mouth 2 (two) times daily.    Yes Historical Provider, MD  bisoprolol-hydrochlorothiazide (ZIAC) 5-6.25 MG tablet Take 1 tablet by mouth daily. 01/22/17  Yes Historical Provider, MD  cephALEXin (KEFLEX) 500 MG capsule Take 1 capsule (500 mg total) by mouth 2 (two) times daily. 11/27/16  Yes Rod Can, MD  ENSURE (ENSURE) Take 237 mLs  by mouth 2 (two) times daily between meals.   Yes Historical Provider, MD  Multiple Vitamins-Minerals (MULTIVITAMIN PO) Take 1 tablet by mouth daily.   Yes Historical Provider, MD  Multiple Vitamins-Minerals (PRESERVISION AREDS PO) Take 1 capsule by mouth 2 (two) times daily.   Yes Historical Provider, MD  ondansetron (ZOFRAN) 4 MG tablet Take 4 mg by mouth every 6 (six) hours as needed for nausea or vomiting.   Yes Historical Provider, MD  polyvinyl alcohol (LIQUIFILM TEARS) 1.4 % ophthalmic solution Place 1 drop into both eyes as needed for dry eyes.   Yes Historical Provider, MD  temazepam (RESTORIL) 15 MG capsule Take 15 mg by mouth at bedtime as needed for sleep. 01/31/17  Yes Historical Provider, MD  Vitamin D, Ergocalciferol, (DRISDOL) 50000 UNITS CAPS capsule Take 50,000 Units by mouth every 7 (seven) days. THURSDAYS   Yes Historical Provider, MD  docusate sodium (COLACE) 100 MG capsule Take 100 mg by mouth 2 (two) times daily as needed for mild constipation.    Historical Provider, MD  enoxaparin (LOVENOX) 40 MG/0.4ML injection Inject 0.4 mLs (40 mg total) into the skin every 12 (twelve) hours. Patient not taking: Reported on 02/22/2017 11/27/16   Rod Can, MD  ferrous sulfate 325 (65 FE) MG tablet Take 325 mg by mouth daily with breakfast.    Historical Provider, MD  HYDROcodone-acetaminophen (NORCO/VICODIN) 5-325 MG tablet Take 1-2 tablets by mouth every 4 (four) hours as needed (breakthrough pain). Patient not taking: Reported on 02/22/2017 11/27/16   Rod Can, MD    Family History Family History  Problem Relation Age of Onset  . Heart disease Mother   . Cancer Mother   . Heart disease Father     Social History Social History  Substance Use Topics  . Smoking status: Never Smoker  . Smokeless tobacco: Never Used  . Alcohol use No     Allergies   Penicillins and Ampicillin   Review of Systems Review of Systems  Musculoskeletal: Positive for myalgias.  Skin:  Positive for color change.  All other systems reviewed and are negative.    Physical Exam Updated Vital Signs BP (!) 165/56   Pulse 80   Temp 98.9 F (37.2 C) (Rectal)   Resp (!) 21   Wt 84.4 kg   SpO2 98%   BMI 37.57 kg/m   Physical Exam  Constitutional: She is oriented to person, place, and time. She appears well-developed and well-nourished. No distress.  HENT:  Head: Normocephalic and atraumatic.  Cardiovascular: Normal rate, regular rhythm and normal heart sounds.   No murmur heard. Pulmonary/Chest: Effort normal and breath sounds normal. No respiratory distress.  Abdominal: Soft. She exhibits no distension. There is no tenderness.  Neurological: She is alert and oriented to person, place, and time.  Skin: Skin is warm and dry.  See image of right thigh below: Tender and warm to the touch.   Psychiatric: She has a normal mood and  affect. Her behavior is normal. Judgment and thought content normal.  Nursing note and vitals reviewed.      ED Treatments / Results  Labs (all labs ordered are listed, but only abnormal results are displayed) Labs Reviewed  COMPREHENSIVE METABOLIC PANEL - Abnormal; Notable for the following:       Result Value   Sodium 133 (*)    Chloride 99 (*)    Glucose, Bld 196 (*)    Calcium 8.8 (*)    Albumin 2.8 (*)    ALT 11 (*)    GFR calc non Af Amer 54 (*)    All other components within normal limits  CBC WITH DIFFERENTIAL/PLATELET - Abnormal; Notable for the following:    WBC 15.0 (*)    RBC 3.74 (*)    Hemoglobin 9.9 (*)    HCT 31.4 (*)    Neutro Abs 12.1 (*)    Monocytes Absolute 1.4 (*)    All other components within normal limits  URINALYSIS, ROUTINE W REFLEX MICROSCOPIC - Abnormal; Notable for the following:    Color, Urine STRAW (*)    Specific Gravity, Urine 1.004 (*)    Hgb urine dipstick MODERATE (*)    Bacteria, UA RARE (*)    Squamous Epithelial / LPF 0-5 (*)    All other components within normal limits  I-STAT CG4  LACTIC ACID, ED - Abnormal; Notable for the following:    Lactic Acid, Venous 3.14 (*)    All other components within normal limits  AEROBIC CULTURE (SUPERFICIAL SPECIMEN)  CULTURE, BLOOD (ROUTINE X 2)  CULTURE, BLOOD (ROUTINE X 2)  URINE CULTURE  I-STAT CG4 LACTIC ACID, ED    EKG  EKG Interpretation None       Radiology No results found.  Procedures Procedures (including critical care time)  INCISION AND DRAINAGE Performed by: Jola Schmidt, MD Consent: Verbal consent obtained. Risks and benefits: risks, benefits and alternatives were discussed Type: abscess Body area: right thigh Anesthesia: local infiltration Incision was made with a scalpel. Local anesthetic: lidocaine 2% with epinephrine Anesthetic total: 5 ml Complexity: complex Blunt dissection to break up loculations Drainage: purulent Drainage amount: large Packing material: none Patient tolerance: Patient tolerated the procedure well with no immediate complications.   Medications Ordered in ED Medications  vancomycin (VANCOCIN) 1,500 mg in sodium chloride 0.9 % 500 mL IVPB (not administered)  metroNIDAZOLE (FLAGYL) IVPB 500 mg (not administered)  aztreonam (AZACTAM) 1 g in dextrose 5 % 50 mL IVPB (not administered)  vancomycin (VANCOCIN) IVPB 1000 mg/200 mL premix (not administered)  sodium chloride 0.9 % bolus 1,000 mL (0 mLs Intravenous Stopped 02/22/17 1315)    And  sodium chloride 0.9 % bolus 1,000 mL (0 mLs Intravenous Stopped 02/22/17 1315)  aztreonam (AZACTAM) 2 g in dextrose 5 % 50 mL IVPB (0 g Intravenous Stopped 02/22/17 1214)  metroNIDAZOLE (FLAGYL) IVPB 500 mg (0 mg Intravenous Stopped 02/22/17 1323)  lidocaine-EPINEPHrine (XYLOCAINE W/EPI) 2 %-1:200000 (PF) injection 10 mL (10 mLs Intradermal Given by Other 02/22/17 1214)     Initial Impression / Assessment and Plan / ED Course  I have reviewed the triage vital signs and the nursing notes.  Pertinent labs & imaging results that were available  during my care of the patient were reviewed by me and considered in my medical decision making (see chart for details).    Dawn Lawson is a 83 y.o. female who presents to ED from orthopedic clinic for redness to right thigh c/w abscess  with extensive surrounding cellulitis. Patient is on Keflex daily for prophylactic infection prevention due to right knee replacement becoming infected and requiring hardware removal. Area today does not seem to be related to the knee. I&D performed with large amount of purulent discharge expressed - wound cx obtained from I&D. Lactic elevated at 3.14. WBC elevated at 15. Other labs baseline. Blood cx's obtained and patient started on PCN allergic cellulitis ABX. Hospitalist consulted who will admit.   Patient seen by and discussed with Dr. Venora Maples who agrees with treatment plan.    Final Clinical Impressions(s) / ED Diagnoses   Final diagnoses:  Cellulitis and abscess of leg  SIRS (systemic inflammatory response syndrome) ([place])    New Prescriptions New Prescriptions   No medications on file     [facility] Arrin Ishler, PA-C 02/22/17 [place], MD 02/22/17 [phone]

## 2017-02-22 NOTE — ED Notes (Signed)
Remo Lipps, RN accepts report for PT

## 2017-02-22 NOTE — ED Notes (Signed)
[place], [place] [phone]

## 2017-02-22 NOTE — H&P (Signed)
Date: 02/22/2017               Patient Name:  Dawn Lawson MRN: [id number]  DOB: Dec 31, 1928 Age / Sex: 83 y.o., female   PCP: Tamsen Roers, MD         Medical Service: Internal Medicine Teaching Service         Attending Physician: Dr. Axel Filler, MD    First Contact: Dr. Einar Gip Pager: [phone]  Second Contact: Dr. Ignacia Marvel Pager: [phone]       After Hours (After 5p/  First Contact Pager: [phone]  weekends / holidays): Second Contact Pager: [phone]   Chief Complaint: right thigh pain and redness  History of Present Illness:  83 y/o F with Mhx of knee replacement with joint infection 11/2016, anemia, HTN, and pre-diabetes who presents for evaluation of right upper thigh tenderness, redness and swelling which patient first noticed Monday. This started as a dime-sized red area which quickly spread to its current state. She denies any trauma, injury or known bite. Did not try any OTC redmedies. Denies any prior skin infections, boils or painful bumps. She also denies fevers, chills, myalgias, fatigue, worsening cough, SOB, CP or knee pain. Hx of knee replacement 08/2016 with joint infection 11/2016 s/p washout procedure and has been compliant with daily Keflex 500 mg twice daily for infection prevention since 11/27/16.   In the ED, temp 99.2*, pulse 62, BP 144/42, respirations 16 and saturating 97% on RA. BMET with glucose of 196 and albumin 2.8 but otherwise wnl. CBC with leukocytosis at 15,000 and stable chronic normocytic anemia with Hb 9.9. Lactic acid 3.14 which resolved with IVF. Bedside I&D performed which yielded a large amount of purulent discharge. Wound gram stain with GNR, culture and sensitivities pending. Blood cultures obtained and she was started on empiric broad spectrum Aztreonam, Flagyl and Vancomycin. IMTS asked to admit.   Meds:  Current Meds  Medication Sig  . acetaminophen (TYLENOL) 500 MG tablet Take 500-1,000 mg by mouth every 6 (six) hours as  needed for moderate pain.  Marland Kitchen aspirin EC 81 MG tablet Take 81 mg by mouth 2 (two) times daily.   . bisoprolol-hydrochlorothiazide (ZIAC) 5-6.25 MG tablet Take 1 tablet by mouth daily.  . cephALEXin (KEFLEX) 500 MG capsule Take 1 capsule (500 mg total) by mouth 2 (two) times daily.  Marland Kitchen ENSURE (ENSURE) Take 237 mLs by mouth 2 (two) times daily between meals.  . Multiple Vitamins-Minerals (MULTIVITAMIN PO) Take 1 tablet by mouth daily.  . Multiple Vitamins-Minerals (PRESERVISION AREDS PO) Take 1 capsule by mouth 2 (two) times daily.  . ondansetron (ZOFRAN) 4 MG tablet Take 4 mg by mouth every 6 (six) hours as needed for nausea or vomiting.  . polyvinyl alcohol (LIQUIFILM TEARS) 1.4 % ophthalmic solution Place 1 drop into both eyes as needed for dry eyes.  Marland Kitchen temazepam (RESTORIL) 15 MG capsule Take 15 mg by mouth at bedtime as needed for sleep.  . Vitamin D, Ergocalciferol, (DRISDOL) 50000 UNITS CAPS capsule Take 50,000 Units by mouth every 7 (seven) days. THURSDAYS  . [DISCONTINUED] bisoprolol-hydrochlorothiazide (ZIAC) 2.5-6.25 MG tablet Take 1 tablet by mouth daily.   Allergies: Allergies as of 02/22/2017 - Review Complete 02/22/2017  Allergen Reaction Noted  . Penicillins Hives and Itching 03/22/2014  . Ampicillin Hives and Itching 03/22/2014   Past Medical History:  Diagnosis Date  . AKI (acute kidney injury) ([place]) 03/22/2014  . Anemia   . Arthritis   .  ATN (acute tubular necrosis) ([place]) 03/22/2014  . Cataract   . Heart murmur   . Hypertension   . Lactic acidosis 03/22/2014  . PONV (postoperative nausea and vomiting)   . Pre-diabetes   . Sepsis due to group B Streptococcus ([place]) 04/14/2014  . Severe sepsis with acute organ dysfunction ([place]) 03/22/2014  . Status post PICC central line placement 04/14/2014  . Urinary tract infection 03/22/2014    Family History: Mother had history of heart disease and cancer. Father with history of heart disease. 2 daughters and 1 son which are alive and  healthy.   Social History: She has never smoked, drank or used recreational drugs.   Review of Systems: A complete ROS was negative except as per HPI.   Physical Exam: Blood pressure (!) 165/56, pulse 80, temperature 98.9 F (37.2 C), temperature source Rectal, resp. rate (!) 21, weight 186 lb (84.4 kg), SpO2 98 %. General: Very pleasant elderly caucasian female resting comfortably in bed. In no acute distress. Good sense of humor, asking for food and water.  HENT: PERRL. EOMI. No conjunctival injection, icterus or ptosis. Oropharynx clear, mucous membranes a little dry.  Cardiovascular: Regular rate and rhythm. No murmur or rub appreciated. Pulmonary: CTA BL, no wheezing, crackles or rhonchi appreciated. Unlabored breathing. Good air movement.  Abdomen: Soft, non-tender and non-distended. No guarding or rigidity. +bowel sounds.  Extremities: No peripheral edema noted BL. No gross deformities. Skin: Warm, dry. No cyanosis. Large erythematous area on right anteromedial thigh, gauze overlaying. Neuro: Strength and sensation grossly intact. Alert and oriented x3 (person, place, date) Psych: Mood normal and affect was mood congruent. Responds to questions appropriately.   EKG, independently reviewed: NSR with rate 82. No QT or PR prolongation. No ST segment changes or new TWI.   Assessment & Plan by Problem: Principal Problem:   Abscess of right thigh Active Problems:   Cellulitis of right leg   HTN (hypertension)   Infection of total right knee replacement (HCC)  Abscess of right thigh with surrounding cellulitis: The patient is NOT septic. She is afebrile, not tachycardic, mild leukocytosis and has mildly elevated lactic acid. These can be explained by current infection. S/p I&D w/ large amount of purulent material. Gram stain with GNR, culture and sensitivities pending. She has been on Keflex 500mg  BID since 11/27/16 and reports strict compliance. Will de-escalate antibiotics for GNR  coverage + joint prophylaxis.  -Wound culture & sensitivities pending -Cefepime -FU Bcx -CBC in AM -Trend fever -ultram PRN  Hx of Infected total right knee replacement: S/p hardware removal, washout and pt has been on prophylactic BID Keflex since 11/27/16. She denies any swelling, redness or knee pain. Interestingly, can not find positive joint cultures and hardware cultures negative for any bacterial growth.   Hyperglycemia: At 196. Could be secondary to acute infection although does have hx of "pre-diabetes" per chart review and has been hyperglycemic in the past.  -Will obtain HbA1c.   Hypertension: Diet controlled. HTN on admission however improved currently.   Chronic normocytic anemia, Hx of iron deficiency anemia: On iron suppl outpt. BL ~7, 9.9 on admission. No acute blood loss per patient other than from I&D. -Transfuse <7  Diet: Heart IVF: S/p 2L NS DVT PPx: Lovenox Code Status: DNR, this was confirmed with patient at bedside upon admission.  Dispo: Admit patient to Inpatient with expected length of stay greater than 2 midnights.  SignedEinar Gip, DO 02/22/2017, 1:56 PM  Pager: [phone]

## 2017-02-22 NOTE — ED Notes (Signed)
Gave pt Diet Coke, per the Hospitalists.

## 2017-02-22 NOTE — Progress Notes (Signed)
Pharmacy Antibiotic Note  Dawn Lawson is a 83 y.o. female admitted on 02/22/2017 with cellulitis of R upper thigh. She had been taking Keflex prior to admission without improvement. She has a documented penicillin allergy but since she tolerated Keflex it may be prudent to change her antibiotics from aztreonam/flagyl to a carbapenem upon admission; she has already received a dose of Aztreonam in the ED. WBC 15, LA 3.1, SCr 0.9.   Plan: Vancomycin 1500 mg IV x1 Vancomycin 1 IV every 24 hours.  Goal trough 15-20 mcg/mL. Flagyl 500 mg IV q8h Aztreonam 1 g IV q8h Monitor renal fx, cultures, VT as needed    Recent Labs Lab 02/22/17 1051 02/22/17 1101  WBC 15.0*  --   LATICACIDVEN  --  3.14*    CrCl cannot be calculated (Patient's most recent lab result is older than the maximum 21 days allowed.).    Allergies  Allergen Reactions  . Penicillins Hives and Itching    Tolerated Ancef Nov 2017.  TDD  Has patient had a PCN reaction causing immediate rash, facial/tongue/throat swelling, SOB or lightheadedness with hypotension: NO Has patient had a PCN reaction causing severe rash involving mucus membranes or skin necrosis: {NO Has patient had a PCN reaction that required hospitalization {NO Has patient had a PCN reaction occurring within the last 10 years:  # # YES # #  If all of the above answers are "NO", then may proceed with Cephalosporin use.  . Ampicillin Hives and Itching    Antimicrobials this admission: 5/3 vancomycin > 5/3 flagyl > 5/3 aztreonam >   Dose adjustments this admission: N/A   Microbiology results: 5/3 blood cx: 5/3 urine cx:   Dawn Lawson 02/22/2017 11:33 AM

## 2017-02-22 NOTE — Progress Notes (Signed)
PHARMACY NOTE:  ANTIMICROBIAL RENAL DOSAGE ADJUSTMENT  Current antimicrobial regimen includes a mismatch between antimicrobial dosage and estimated renal function.  As per policy approved by the Pharmacy & Therapeutics and Medical Executive Committees, the antimicrobial dosage will be adjusted accordingly.  Current antimicrobial dosage:  Cefepime 2gm IV q12h  Indication: Skin abscess  Renal Function: CrCl ~43 ml/min  Estimated Creatinine Clearance: 43 mL/min (by C-G formula based on SCr of 0.93 mg/dL). []      On intermittent HD, scheduled: []      On CRRT    Antimicrobial dosage has been changed to:  Cefepime 2gm IV q24h  Additional comments: Of note, pt has PCN allergy but has tolerated various cephalosporins in the past.   Thank you for allowing pharmacy to be a part of this patient's care.  Arrie Senate, PharmD PGY-1 Pharmacy Resident Pager: [phone] 02/22/2017

## 2017-02-22 NOTE — ED Triage Notes (Signed)
Pt reports redness and pain to upper right thigh x4 days, saw dr Delfino Lovett today, concerned for cellulitis, sent pt here for further evaluation.

## 2017-02-23 DIAGNOSIS — Z880 Allergy status to penicillin: Secondary | ICD-10-CM

## 2017-02-23 DIAGNOSIS — I10 Essential (primary) hypertension: Secondary | ICD-10-CM | POA: Diagnosis not present

## 2017-02-23 DIAGNOSIS — M1990 Unspecified osteoarthritis, unspecified site: Secondary | ICD-10-CM

## 2017-02-23 DIAGNOSIS — L03115 Cellulitis of right lower limb: Secondary | ICD-10-CM | POA: Diagnosis not present

## 2017-02-23 DIAGNOSIS — L03119 Cellulitis of unspecified part of limb: Secondary | ICD-10-CM | POA: Diagnosis not present

## 2017-02-23 DIAGNOSIS — L02415 Cutaneous abscess of right lower limb: Principal | ICD-10-CM

## 2017-02-23 DIAGNOSIS — D649 Anemia, unspecified: Secondary | ICD-10-CM

## 2017-02-23 DIAGNOSIS — D509 Iron deficiency anemia, unspecified: Secondary | ICD-10-CM | POA: Diagnosis not present

## 2017-02-23 DIAGNOSIS — Z66 Do not resuscitate: Secondary | ICD-10-CM | POA: Diagnosis not present

## 2017-02-23 LAB — BASIC METABOLIC PANEL
Anion gap: 9 (ref 5–15)
BUN: 15 mg/dL (ref 6–20)
CHLORIDE: 103 mmol/L (ref 101–111)
CO2: 23 mmol/L (ref 22–32)
Calcium: 7.8 mg/dL — ABNORMAL LOW (ref 8.9–10.3)
Creatinine, Ser: 0.8 mg/dL (ref 0.44–1.00)
GFR calc Af Amer: >60 mL/min (ref >60)
GFR calc non Af Amer: >60 mL/min (ref >60)
Glucose, Bld: 111 mg/dL — ABNORMAL HIGH (ref 65–99)
POTASSIUM: 3.8 mmol/L (ref 3.5–5.1)
SODIUM: 135 mmol/L (ref 135–145)

## 2017-02-23 LAB — CBC
HEMATOCRIT: 27.5 % — AB (ref 36.0–46.0)
HEMOGLOBIN: 8.5 g/dL — AB (ref 12.0–15.0)
MCH: 25.8 pg — AB (ref 26.0–34.0)
MCHC: 30.9 g/dL (ref 30.0–36.0)
MCV: 83.6 fL (ref 78.0–100.0)
Platelets: 300 10*3/uL (ref 150–400)
RBC: 3.29 MIL/uL — AB (ref 3.87–5.11)
RDW: 15.3 % (ref 11.5–15.5)
WBC: 12.9 10*3/uL — ABNORMAL HIGH (ref 4.0–10.5)

## 2017-02-23 LAB — HEMOGLOBIN A1C
HEMOGLOBIN A1C: 6.3 % — AB (ref 4.8–5.6)
Mean Plasma Glucose: 134 mg/dL

## 2017-02-23 MED ORDER — CIPROFLOXACIN HCL 500 MG PO TABS: 500.0000 mg | ORAL_TABLET | Freq: Two times a day (BID) | ORAL | Status: DC

## 2017-02-23 MED ORDER — CIPROFLOXACIN HCL 500 MG PO TABS: 500.0000 mg | ORAL_TABLET | Freq: Two times a day (BID) | ORAL | 0 refills | Status: DC

## 2017-02-23 MED ORDER — CIPROFLOXACIN HCL 500 MG PO TABS
500.0000 mg | ORAL_TABLET | Freq: Two times a day (BID) | ORAL | Status: DC
  Administered 2017-02-23: 500 mg via ORAL
  Filled 2017-02-23: qty 1

## 2017-02-23 NOTE — Discharge Summary (Signed)
Name: Dawn Lawson MRN: [id number] DOB: 11/11/1928 83 y.o. PCP: Tamsen Roers, MD  Date of Admission: 02/22/2017 11:06 AM Date of Discharge: 02/23/2017 Attending Physician: Axel Filler, MD  Discharge Diagnosis: 1. Abscess of right inner thigh, cellulitis 2. Hypertension Principal Problem:   Abscess of right thigh Active Problems:   HTN (hypertension)   Anemia   Arthritis   Discharge Medications: Allergies as of 02/23/2017      Reactions   Penicillins Hives, Itching   Tolerated Ancef 11/17 and cefepime 02/2017 Has patient had a PCN reaction causing immediate rash, facial/tongue/throat swelling, SOB or lightheadedness with hypotension: NO Has patient had a PCN reaction causing severe rash involving mucus membranes or skin necrosis: {NO Has patient had a PCN reaction that required hospitalization {NO Has patient had a PCN reaction occurring within the last 10 years:  # # YES # #  If all of the above answers are "NO", then may proceed with Cephalosporin use.   Ampicillin Hives, Itching      Medication List    STOP taking these medications   enoxaparin 40 MG/0.4ML injection Commonly known as:  LOVENOX   HYDROcodone-acetaminophen 5-325 MG tablet Commonly known as:  NORCO/VICODIN     TAKE these medications   acetaminophen 500 MG tablet Commonly known as:  TYLENOL Take 500-1,000 mg by mouth every 6 (six) hours as needed for moderate pain.   aspirin EC 81 MG tablet Take 81 mg by mouth 2 (two) times daily.   bisoprolol-hydrochlorothiazide 5-6.25 MG tablet Commonly known as:  ZIAC Take 1 tablet by mouth daily.   cephALEXin 500 MG capsule Commonly known as:  KEFLEX Take 1 capsule (500 mg total) by mouth 2 (two) times daily.   ciprofloxacin 500 MG tablet Commonly known as:  CIPRO Take 1 tablet (500 mg total) by mouth 2 (two) times daily. Starting evening of 5/4   docusate sodium 100 MG capsule Commonly known as:  COLACE Take 100 mg by mouth 2 (two) times  daily as needed for mild constipation.   ENSURE Take 237 mLs by mouth 2 (two) times daily between meals.   ferrous sulfate 325 (65 FE) MG tablet Take 325 mg by mouth daily with breakfast.   MULTIVITAMIN PO Take 1 tablet by mouth daily.   PRESERVISION AREDS PO Take 1 capsule by mouth 2 (two) times daily.   ondansetron 4 MG tablet Commonly known as:  ZOFRAN Take 4 mg by mouth every 6 (six) hours as needed for nausea or vomiting.   polyvinyl alcohol 1.4 % ophthalmic solution Commonly known as:  LIQUIFILM TEARS Place 1 drop into both eyes as needed for dry eyes.   temazepam 15 MG capsule Commonly known as:  RESTORIL Take 15 mg by mouth at bedtime as needed for sleep.   Vitamin D (Ergocalciferol) 50000 units Caps capsule Commonly known as:  DRISDOL Take 50,000 Units by mouth every 7 (seven) days. THURSDAYS      Disposition and follow-up:   [name] was discharged from [facility] in Stable condition.  At the hospital follow up visit please address:  1.  Right thigh abscess: Please ensure compliance with ciprofloxacin 500 mg twice daily 5 days as well as continued improvement of her infection. End date 02/28/17.  2.  Labs / imaging needed at time of follow-up: None  3.  Pending labs/ test needing follow-up: Culture and sensitivities  Follow-up Appointments:  primary care physician within one week  Hospital Course by problem  list: Principal Problem:   Abscess of right thigh Active Problems:   HTN (hypertension)   Anemia   Arthritis   1. Abscess of right inner thigh, surrounding cellulitis 83 year old female presents with four-day history of progressively worsening pain, swelling, and erythema of her right inner thigh. Initially began as a small pimple however rapidly progressed to large abscess. I&D in the emergency department yielded large amount of purulent fluid and Gram stain revealed gram-negative rods.  She was given 1 dose each of  aztreonam, Flagyl and vancomycin from ED however this was narrowed to cefepime on admission. She had significant improvement of her pain following incision and drainage reported no pain the following morning. She was subsequently discharged home with a 5 day course of ciprofloxacin 500 mg twice a day with close follow-up with her primary care physician next week. Culture and sensitivities pending at time of discharge.  Discharge Vitals:   BP (!) 133/47 (BP Location: Left Arm)   Pulse 64   Temp 99.5 F (37.5 C) (Oral)   Resp 20   Ht 5' (1.524 m)   Wt 189 lb 2.5 oz (85.8 kg)   SpO2 94%   BMI 36.94 kg/m   Pertinent Labs, Studies, and Procedures:  Wound culture and Gram stain: Abundant gram-negative rods, culture and sensitivities pending at time of discharge   Discharge Instructions: Please complete your course of Ciprofloxacin and follow-up closely with your primary care physician. Please contact your PCP or go to the ED immediately should your infection worsen or if you were to develop fevers or chills.   SignedEinar Gip, DO 02/23/2017, 1:47 PM   Pager: [phone]

## 2017-02-23 NOTE — Progress Notes (Signed)
   Subjective: Seen and evaluated today. Son present. Patient eager for d/c. Only reports minimal pain. No fevers or chills ON. Eating and drinking well.   Objective:  Vital signs in last 24 hours: Vitals:   02/22/17 1445 02/22/17 1500 02/22/17 2036 02/23/17 0530  BP: 131/87 (!) 158/47 (!) 143/45 (!) 133/47  Pulse: 81 81 73 64  Resp: (!) 22 (!) 21 19 20   Temp:  99.1 F (37.3 C) (!) 100.8 F (38.2 C) 99.5 F (37.5 C)  TempSrc:  Oral Oral Oral  SpO2: 98% 97% 93% 94%  Weight:  189 lb 2.5 oz (85.8 kg)    Height:  5' (1.524 m)     General: In no acute distress. Resting comfortably in bedside chair. Eager for d/c. HENT: No conjunctival injection, icterus or ptosis. Mucous membranes moist.  Cardiovascular: Regular rate and rhythm. No murmur or rub appreciated. Pulmonary: CTA BL. Normal WOB. Abdomen: Soft and non-distended. +bowel sounds.  Skin: Warm, dry. Right inner thigh wound improving erythema. Not tender.  Psych: Mood normal and affect was mood congruent. Responds to questions appropriately.   Assessment/Plan:  Principal Problem:   Abscess of right thigh Active Problems:   HTN (hypertension)   Anemia   Arthritis  Abscess of Right Thigh with surrounding cellulitis S/p I&D: Gram stain with abundant GNR, culture and sensitivities pending. Low-grade fever ON and leukocytosis improving. Tolerating Cefepime well. Site with improving surrounding erythema. Still draining scant purulent and serosanguinous fluid. Pt eager for d/c today and stable from medical standpoint with close outpatient f/u. -DC home today with Cipro 500mg  BID x5 days, to get first dose in hospital. -Pt instructed to follow up closely with PCP next week -instructed to keep clean and dry.   HTN: On Bisoprolol-HCTZ 5-6.25mg  at home. A little hypertensive during hospitalization. Will be conveyed in DC sum for outpatient FU with PCP.  Dispo: Anticipated discharge TODAY.   Sugey Trevathan, DO 02/23/2017, 12:17  PM Pager: [phone]

## 2017-02-23 NOTE — Discharge Planning (Signed)
Patient discharged home in stable condition. Verbalizes understanding of all discharge instructions, including home medications and follow up appointments. 

## 2017-02-23 NOTE — Discharge Instructions (Signed)
Ms. Dalto, please take Ciprofloxacin 500mg  twice daily for 5 days starting the [facility] of 02/23/17. While you are on this antibiotic, please DO NOT take Keflex. Please RESUME Keflex once Ciprofloxacin course is completed.  You need to follow-up with Dr. Rex Kras within 1 week of discharge to ensure your wound is healing appropriately.    Incision and Drainage, Care After Refer to this sheet in the next few weeks. These instructions provide you with information about caring for yourself after your procedure. Your health care provider may also give you more specific instructions. Your treatment has been planned according to current medical practices, but problems sometimes occur. Call your health care provider if you have any problems or questions after your procedure. What can I expect after the procedure? After the procedure, it is common to have:  Pain or discomfort around your incision site.  Drainage from your incision. Follow these instructions at home:  Take over-the-counter and prescription medicines only as told by your health care provider.  If you were prescribed an antibiotic medicine, take it as told by your health care provider.Do not stop taking the antibiotic even if you start to feel better.  Followinstructions from your health care provider about:  How to take care of your incision.  When and how you should change your packing and bandage (dressing). Wash your hands with soap and water before you change your dressing. If soap and water are not available, use hand sanitizer.  When you should remove your dressing.  Do not take baths, swim, or use a hot tub until your health care provider approves.  Keep all follow-up visits as told by your health care provider. This is important.  Check your incision area every day for signs of infection. Check for:  More redness, swelling, or pain.  More fluid or blood.  Warmth.  Pus or a bad smell. Contact a health care provider  if:  Your cyst or abscess returns.  You have a fever.  You have more redness, swelling, or pain around your incision.  You have more fluid or blood coming from your incision.  Your incision feels warm to the touch.  You have pus or a bad smell coming from your incision. Get help right away if:  You have severe pain or bleeding.  You cannot eat or drink without vomiting.  You have decreased urine output.  You become short of breath.  You have chest pain.  You cough up blood.  The area where the incision and drainage occurred becomes numb or it tingles. This information is not intended to replace advice given to you by your health care provider. Make sure you discuss any questions you have with your health care provider. Document Released: 01/01/2012 Document Revised: 03/10/2016 Document Reviewed: 07/30/2015 Elsevier Interactive Patient Education  2017 [employer].

## 2017-02-24 LAB — AEROBIC CULTURE  (SUPERFICIAL SPECIMEN)

## 2017-02-24 LAB — AEROBIC CULTURE W GRAM STAIN (SUPERFICIAL SPECIMEN)

## 2017-02-25 LAB — URINE CULTURE

## 2017-02-27 LAB — CULTURE, BLOOD (ROUTINE X 2)
CULTURE: NO GROWTH
Culture: NO GROWTH
Special Requests: ADEQUATE
Special Requests: ADEQUATE

## 2017-02-28 DIAGNOSIS — I10 Essential (primary) hypertension: Secondary | ICD-10-CM | POA: Diagnosis not present

## 2017-02-28 DIAGNOSIS — E119 Type 2 diabetes mellitus without complications: Secondary | ICD-10-CM | POA: Diagnosis not present

## 2017-02-28 DIAGNOSIS — Z981 Arthrodesis status: Secondary | ICD-10-CM | POA: Diagnosis not present

## 2017-05-30 DIAGNOSIS — T8459XD Infection and inflammatory reaction due to other internal joint prosthesis, subsequent encounter: Secondary | ICD-10-CM | POA: Diagnosis not present

## 2017-05-30 DIAGNOSIS — Z96651 Presence of right artificial knee joint: Secondary | ICD-10-CM | POA: Diagnosis not present

## 2017-07-26 IMAGING — CR DG KNEE 1-2V PORT*R*
4 series · 4 of 4 positions shown · non-contrast
Comparison: 09/18/2016 .

CLINICAL DATA: Right knee surgery.

EXAM:
PORTABLE RIGHT KNEE - 1-2 VIEW

[AP]
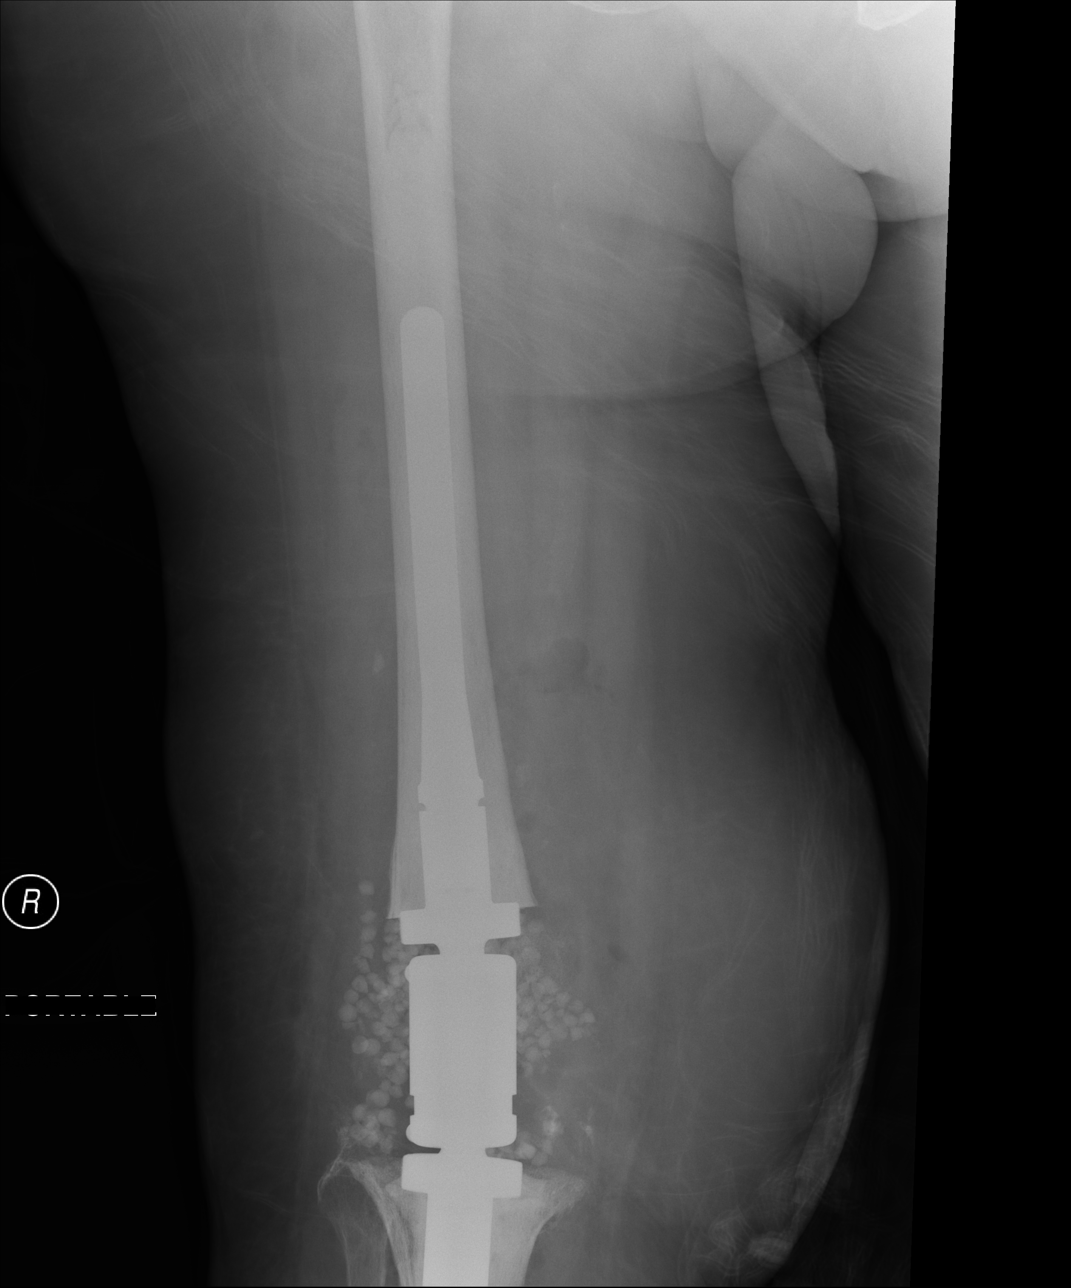

[xtable lateral (1 of 2)]
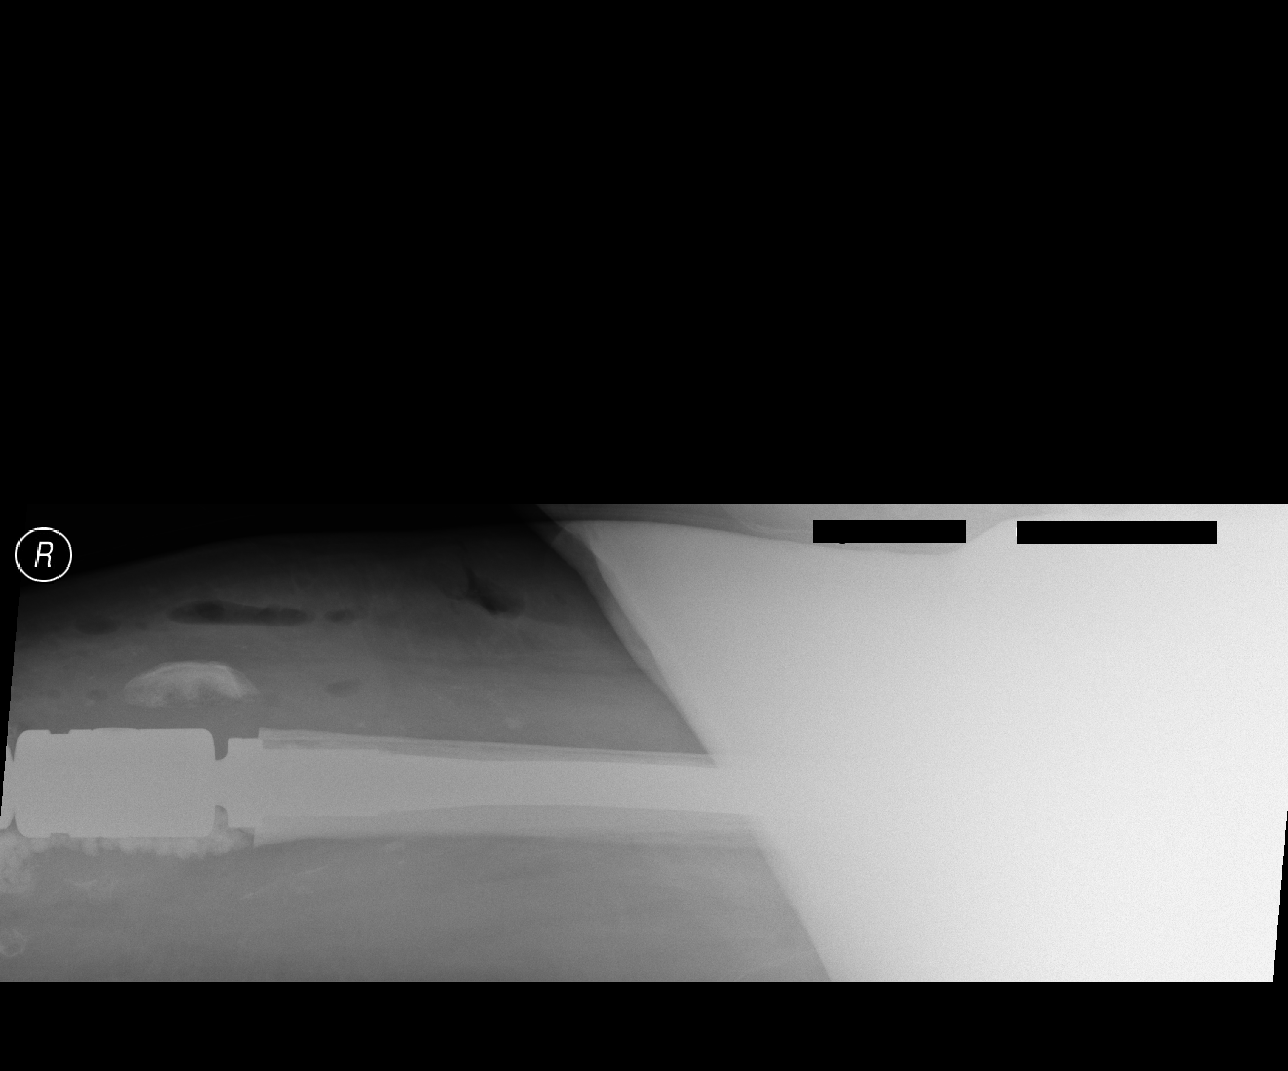

[xtable lateral (2 of 2)]
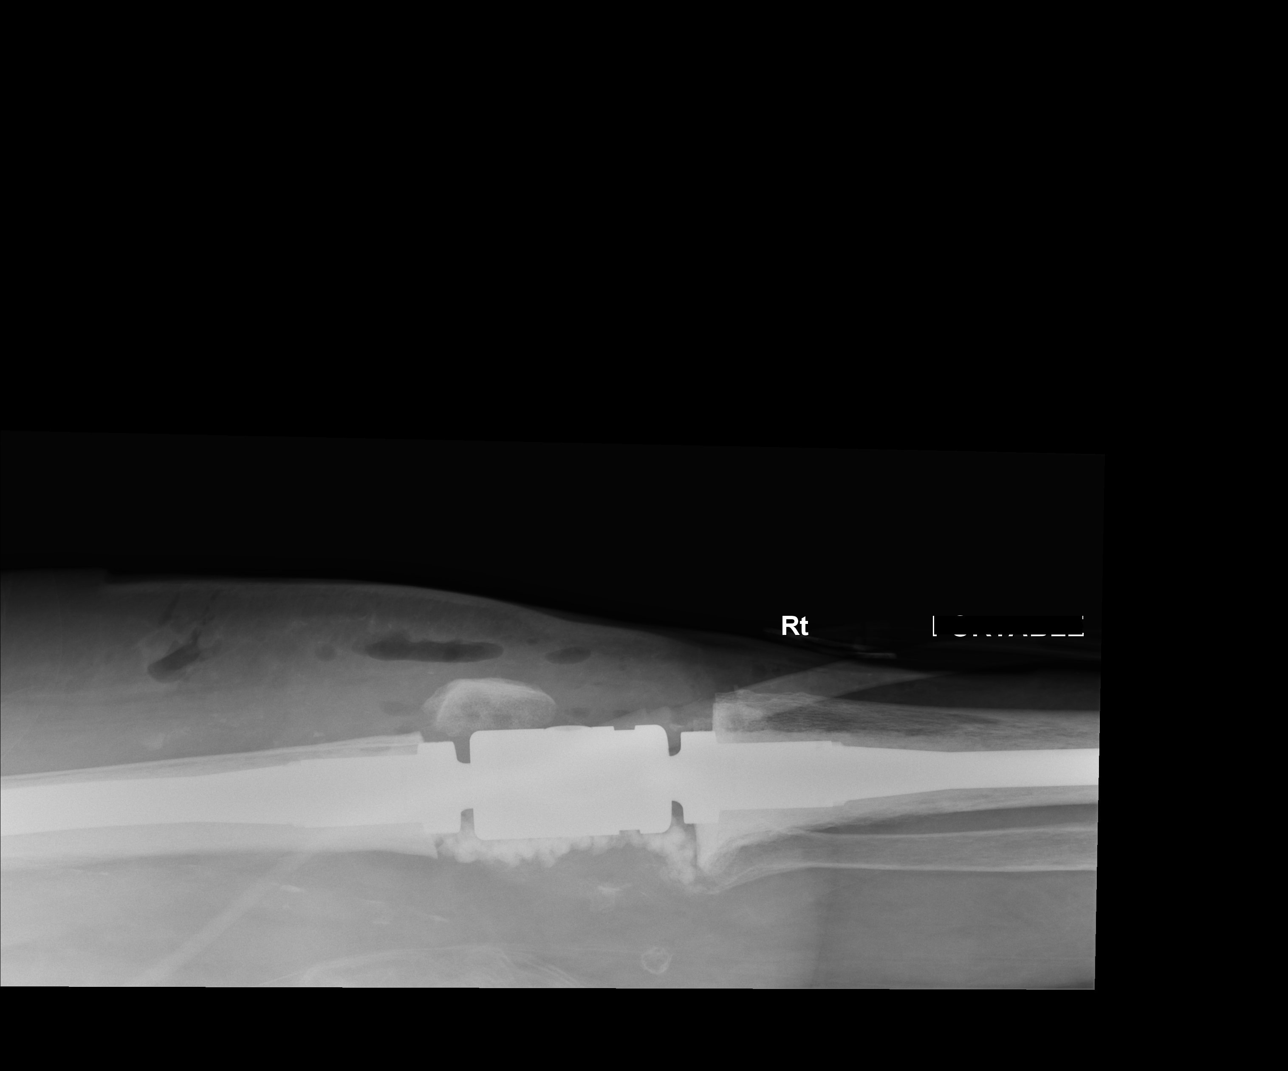

[lateral]
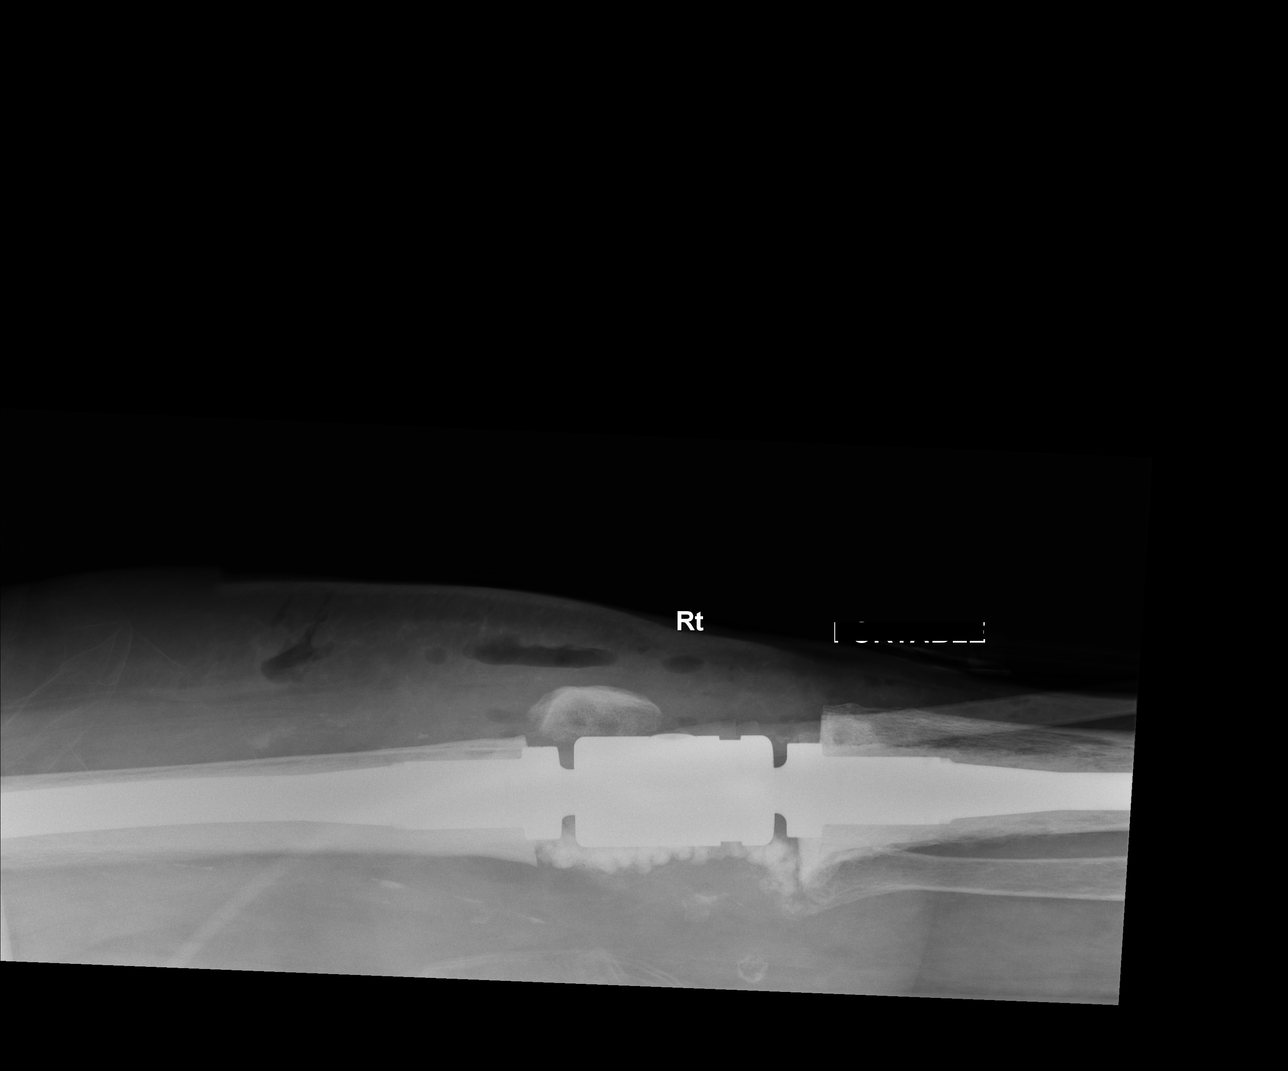

[4 of 4 positions shown; findings below may reference images not displayed]

FINDINGS: Postsurgical changes are noted about the right knee.
Methylmethacrylate beads noted about the knee joint space. Hardware
intact. Anatomic alignment.
IMPRESSION: Postsurgical changes right knee.

## 2017-12-04 DIAGNOSIS — I10 Essential (primary) hypertension: Secondary | ICD-10-CM | POA: Insufficient documentation

## 2019-07-16 ENCOUNTER — Encounter (HOSPITAL_COMMUNITY): Payer: Self-pay

## 2019-07-16 ENCOUNTER — Emergency Department (HOSPITAL_COMMUNITY): Payer: Medicare Other

## 2019-07-16 ENCOUNTER — Emergency Department (HOSPITAL_COMMUNITY)
Admission: EM | Admit: 2019-07-16 | Discharge: 2019-07-16 | Disposition: A | Discharge status: Home / Self Care | Payer: Medicare Other | Attending: Emergency Medicine | Admitting: Emergency Medicine

## 2019-07-16 ENCOUNTER — Ambulatory Visit (INDEPENDENT_AMBULATORY_CARE_PROVIDER_SITE_OTHER): Payer: Medicare Other | Admitting: Family Medicine

## 2019-07-16 ENCOUNTER — Encounter: Payer: Self-pay | Admitting: Family Medicine

## 2019-07-16 ENCOUNTER — Other Ambulatory Visit: Payer: Self-pay

## 2019-07-16 VITALS — BP 210/63 | HR 69 | Temp 98.3°F

## 2019-07-16 DIAGNOSIS — Z7982 Long term (current) use of aspirin: Secondary | ICD-10-CM | POA: Insufficient documentation

## 2019-07-16 DIAGNOSIS — I169 Hypertensive crisis, unspecified: Secondary | ICD-10-CM | POA: Diagnosis not present

## 2019-07-16 DIAGNOSIS — I10 Essential (primary) hypertension: Secondary | ICD-10-CM | POA: Insufficient documentation

## 2019-07-16 DIAGNOSIS — Z5320 Procedure and treatment not carried out because of patient's decision for unspecified reasons: Secondary | ICD-10-CM

## 2019-07-16 DIAGNOSIS — Z833 Family history of diabetes mellitus: Secondary | ICD-10-CM

## 2019-07-16 DIAGNOSIS — Z7689 Persons encountering health services in other specified circumstances: Secondary | ICD-10-CM

## 2019-07-16 DIAGNOSIS — Z800 Family history of malignant neoplasm of digestive organs: Secondary | ICD-10-CM

## 2019-07-16 DIAGNOSIS — R609 Edema, unspecified: Secondary | ICD-10-CM

## 2019-07-16 DIAGNOSIS — Z7409 Other reduced mobility: Secondary | ICD-10-CM

## 2019-07-16 DIAGNOSIS — Z91199 Patient's noncompliance with other medical treatment and regimen due to unspecified reason: Secondary | ICD-10-CM | POA: Insufficient documentation

## 2019-07-16 DIAGNOSIS — Z2820 Immunization not carried out because of patient decision for unspecified reason: Secondary | ICD-10-CM

## 2019-07-16 DIAGNOSIS — R739 Hyperglycemia, unspecified: Secondary | ICD-10-CM

## 2019-07-16 DIAGNOSIS — Z9119 Patient's noncompliance with other medical treatment and regimen: Secondary | ICD-10-CM | POA: Insufficient documentation

## 2019-07-16 DIAGNOSIS — Z79899 Other long term (current) drug therapy: Secondary | ICD-10-CM | POA: Insufficient documentation

## 2019-07-16 DIAGNOSIS — Z87891 Personal history of nicotine dependence: Secondary | ICD-10-CM | POA: Diagnosis not present

## 2019-07-16 DIAGNOSIS — E66813 Obesity, class 3: Secondary | ICD-10-CM

## 2019-07-16 DIAGNOSIS — Z9189 Other specified personal risk factors, not elsewhere classified: Secondary | ICD-10-CM

## 2019-07-16 DIAGNOSIS — Z96651 Presence of right artificial knee joint: Secondary | ICD-10-CM | POA: Insufficient documentation

## 2019-07-16 DIAGNOSIS — M868X6 Other osteomyelitis, lower leg: Secondary | ICD-10-CM

## 2019-07-16 DIAGNOSIS — M869 Osteomyelitis, unspecified: Secondary | ICD-10-CM | POA: Insufficient documentation

## 2019-07-16 LAB — CBC WITH DIFFERENTIAL/PLATELET
Abs Immature Granulocytes: 0.03 10*3/uL (ref 0.00–0.07)
Basophils Absolute: 0.1 10*3/uL (ref 0.0–0.1)
Basophils Relative: 1 %
Eosinophils Absolute: 0.1 10*3/uL (ref 0.0–0.5)
Eosinophils Relative: 1 %
HCT: 39.4 % (ref 36.0–46.0)
Hemoglobin: 12.3 g/dL (ref 12.0–15.0)
Immature Granulocytes: 0 %
Lymphocytes Relative: 19 %
Lymphs Abs: 1.5 10*3/uL (ref 0.7–4.0)
MCH: 29.2 pg (ref 26.0–34.0)
MCHC: 31.2 g/dL (ref 30.0–36.0)
MCV: 93.6 fL (ref 80.0–100.0)
Monocytes Absolute: 0.9 10*3/uL (ref 0.1–1.0)
Monocytes Relative: 11 %
Neutro Abs: 5.6 10*3/uL (ref 1.7–7.7)
Neutrophils Relative %: 68 %
Platelets: 212 10*3/uL (ref 150–400)
RBC: 4.21 MIL/uL (ref 3.87–5.11)
RDW: 13.2 % (ref 11.5–15.5)
WBC: 8.3 10*3/uL (ref 4.0–10.5)
nRBC: 0 % (ref 0.0–0.2)

## 2019-07-16 LAB — BASIC METABOLIC PANEL
Anion gap: 15 (ref 5–15)
BUN: 24 mg/dL — ABNORMAL HIGH (ref 8–23)
CO2: 21 mmol/L — ABNORMAL LOW (ref 22–32)
Calcium: 9.3 mg/dL (ref 8.9–10.3)
Chloride: 100 mmol/L (ref 98–111)
Creatinine, Ser: 0.99 mg/dL (ref 0.44–1.00)
GFR calc Af Amer: 59 mL/min — ABNORMAL LOW (ref >60)
GFR calc non Af Amer: 51 mL/min — ABNORMAL LOW (ref >60)
Glucose, Bld: 104 mg/dL — ABNORMAL HIGH (ref 70–99)
Potassium: 4.2 mmol/L (ref 3.5–5.1)
Sodium: 136 mmol/L (ref 135–145)

## 2019-07-16 MED ORDER — HYDRALAZINE HCL 20 MG/ML IJ SOLN
10.0000 mg | Freq: Once | INTRAMUSCULAR | Status: AC
  Administered 2019-07-16: 21:00:00 10 mg via INTRAVENOUS
  Filled 2019-07-16: qty 1

## 2019-07-16 NOTE — ED Notes (Signed)
Patient transported to X-ray 

## 2019-07-16 NOTE — ED Notes (Signed)
Discharge instructions discussed with pt and family at bedside with no questions at this time

## 2019-07-16 NOTE — ED Triage Notes (Signed)
Pt arrives via ems from her MD office with hypertension. Pt with no complaints. 230/70 at MD office. On arrival pt bp 182/82. Denies CP/SOB.

## 2019-07-16 NOTE — Patient Instructions (Signed)
Managing Your Hypertension Hypertension is commonly called high blood pressure. This is when the force of your blood pressing against the walls of your arteries is too strong. Arteries are blood vessels that carry blood from your heart throughout your body. Hypertension forces the heart to work harder to pump blood, and may cause the arteries to become narrow or stiff. Having untreated or uncontrolled hypertension can cause heart attack, stroke, kidney disease, and other problems. What are blood pressure readings? A blood pressure reading consists of a higher number over a lower number. Ideally, your blood pressure should be below 120/80. The first ("top") number is called the systolic pressure. It is a measure of the pressure in your arteries as your heart beats. The second ("bottom") number is called the diastolic pressure. It is a measure of the pressure in your arteries as the heart relaxes. What does my blood pressure reading mean? Blood pressure is classified into four stages. Based on your blood pressure reading, your health care provider may use the following stages to determine what type of treatment you need, if any. Systolic pressure and diastolic pressure are measured in a unit called mm Hg. Normal  Systolic pressure: below 784.  Diastolic pressure: below 80. Elevated  Systolic pressure: 696-295.  Diastolic pressure: below 80. Hypertension stage 1  Systolic pressure: 284-132.  Diastolic pressure: 44-01. Hypertension stage 2  Systolic pressure: 027 or above.  Diastolic pressure: 90 or above. What health risks are associated with hypertension? Managing your hypertension is an important responsibility. Uncontrolled hypertension can lead to:  A heart attack.  A stroke.  A weakened blood vessel (aneurysm).  Heart failure.  Kidney damage.  Eye damage.  Metabolic syndrome.  Memory and concentration problems. What changes can I make to manage my hypertension?  Hypertension can be managed by making lifestyle changes and possibly by taking medicines. Your health care provider will help you make a plan to bring your blood pressure within a normal range. Eating and drinking   Eat a diet that is high in fiber and potassium, and low in salt (sodium), added sugar, and fat. An example eating plan is called the DASH (Dietary Approaches to Stop Hypertension) diet. To eat this way: ? Eat plenty of fresh fruits and vegetables. Try to fill half of your plate at each meal with fruits and vegetables. ? Eat whole grains, such as whole wheat pasta, brown rice, or whole grain bread. Fill about one quarter of your plate with whole grains. ? Eat low-fat diary products. ? Avoid fatty cuts of meat, processed or cured meats, and poultry with skin. Fill about one quarter of your plate with lean proteins such as fish, chicken without skin, beans, eggs, and tofu. ? Avoid premade and processed foods. These tend to be higher in sodium, added sugar, and fat.  Reduce your daily sodium intake. Most people with hypertension should eat less than 1,500 mg of sodium a day.  Limit alcohol intake to no more than 1 drink a day for nonpregnant women and 2 drinks a day for men. One drink equals 12 oz of beer, 5 oz of wine, or 1 oz of hard liquor. Lifestyle  Work with your health care provider to maintain a healthy body weight, or to lose weight. Ask what an ideal weight is for you.  Get at least 30 minutes of exercise that causes your heart to beat faster (aerobic exercise) most days of the week. Activities may include walking, swimming, or biking.  Include exercise  to strengthen your muscles (resistance exercise), such as weight lifting, as part of your weekly exercise routine. Try to do these types of exercises for 30 minutes at least 3 days a week.  Do not use any products that contain nicotine or tobacco, such as cigarettes and e-cigarettes. If you need help quitting, ask your health  care provider.  Control any long-term (chronic) conditions you have, such as high cholesterol or diabetes. Monitoring  Monitor your blood pressure at home as told by your health care provider. Your personal target blood pressure may vary depending on your medical conditions, your age, and other factors.  Have your blood pressure checked regularly, as often as told by your health care provider. Working with your health care provider  Review all the medicines you take with your health care provider because there may be side effects or interactions.  Talk with your health care provider about your diet, exercise habits, and other lifestyle factors that may be contributing to hypertension.  Visit your health care provider regularly. Your health care provider can help you create and adjust your plan for managing hypertension. Will I need medicine to control my blood pressure? Your health care provider may prescribe medicine if lifestyle changes are not enough to get your blood pressure under control, and if:  Your systolic blood pressure is 130 or higher.  Your diastolic blood pressure is 80 or higher. Take medicines only as told by your health care provider. Follow the directions carefully. Blood pressure medicines must be taken as prescribed. The medicine does not work as well when you skip doses. Skipping doses also puts you at risk for problems. Contact a health care provider if:  You think you are having a reaction to medicines you have taken.  You have repeated (recurrent) headaches.  You feel dizzy.  You have swelling in your ankles.  You have trouble with your vision. Get help right away if:  You develop a severe headache or confusion.  You have unusual weakness or numbness, or you feel faint.  You have severe pain in your chest or abdomen.  You vomit repeatedly.  You have trouble breathing. Summary  Hypertension is when the force of blood pumping through your arteries  is too strong. If this condition is not controlled, it may put you at risk for serious complications.  Your personal target blood pressure may vary depending on your medical conditions, your age, and other factors. For most people, a normal blood pressure is less than 120/80.  Hypertension is managed by lifestyle changes, medicines, or both. Lifestyle changes include weight loss, eating a healthy, low-sodium diet, exercising more, and limiting alcohol. This information is not intended to replace advice given to you by your health care provider. Make sure you discuss any questions you have with your health care provider. Document Released: 07/03/2012 Document Revised: 01/31/2019 Document Reviewed: 09/06/2016 Elsevier Patient Education  2020 [employer].      Preventing Cerebrovascular Disease  Arteries are blood vessels that carry blood that contains oxygen from the heart to all parts of the body. Cerebrovascular disease affects arteries that supply the brain. Any condition that blocks or disrupts blood flow to the brain can cause cerebrovascular disease. Brain cells that lose blood supply start to die within minutes (stroke). Stroke is the main danger of cerebrovascular disease. Atherosclerosis and high blood pressure are common causes of cerebrovascular disease. Atherosclerosis is narrowing and hardening of an artery that results when fat, cholesterol, calcium, or other substances (plaque)  build up inside an artery. Plaque reduces blood flow through the artery. High blood pressure increases the risk of bleeding inside the brain. Making diet and lifestyle changes to prevent atherosclerosis and high blood pressure lowers your risk of cerebrovascular disease. What nutrition changes can be made?  Eat more fruits, vegetables, and whole grains.  Reduce how much saturated fat you eat. To do this, eat less red meat and fewer full-fat dairy products.  Eat healthy proteins instead of red meat.  Healthy proteins include: ? Fish. Eat fish that contains heart-healthy omega-3 fatty acids, twice a week. Examples include salmon, albacore tuna, mackerel, and herring. ? Chicken. ? Nuts. ? Low-fat or nonfat yogurt.  Avoid processed meats, like bacon and lunchmeat.  Avoid foods that contain: ? A lot of sugar, such as sweets and drinks with added sugar. ? A lot of salt (sodium). Avoid adding extra salt to your food, as told by your health care provider. ? Trans fats, such as margarine and baked goods. Trans fats may be listed as "partially hydrogenated oils" on food labels.  Check food labels to see how much sodium, sugar, and trans fats are in foods.  Use vegetable oils that contain low amounts of saturated fat, such as olive oil or canola oil. What lifestyle changes can be made?  Drink alcohol in moderation. This means no more than 1 drink a day for nonpregnant women and 2 drinks a day for men. One drink equals 12 oz of beer, 5 oz of wine, or 1 oz of hard liquor.  If you are overweight, ask your health care provider to recommend a weight-loss plan for you. Losing 5-10 lb (2.2-4.5 kg) can reduce your risk of diabetes, atherosclerosis, and high blood pressure.  Exercise for 30?60 minutes on most days, or as much as told by your health care provider. ? Do moderate-intensity exercise, such as brisk walking, bicycling, and water aerobics. Ask your health care provider which activities are safe for you.   Do not use any products that contain nicotine or tobacco, such as cigarettes and e-cigarettes. If you need help quitting, ask your health care provider. Why are these changes important? Making these changes lowers your risk of many diseases that can cause cerebrovascular disease and stroke. Stroke is a leading cause of death and disability. Making these changes also improves your overall health and quality of life. What can I do to lower my risk? The following factors make you more likely  to develop cerebrovascular disease:  Being overweight.  Smoking.  Being physically inactive.  Eating a high-fat diet.  Having certain health conditions, such as: ? Diabetes. ? High blood pressure. ? Heart disease. ? Atherosclerosis. ? High cholesterol. ? Sickle cell disease.  Talk with your health care provider about your risk for cerebrovascular disease. Work with your health care provider to control diseases that you have that may contribute to cerebrovascular disease. Your health care provider may prescribe medicines to help prevent major causes of cerebrovascular disease. Where to find more information Learn more about preventing cerebrovascular disease from:  [place] [place]: [employer]  Centers for Disease Control and Prevention: http://www.curry-wood.biz/ Summary  Cerebrovascular disease can lead to a stroke.  Atherosclerosis and high blood pressure are major causes of cerebrovascular disease.  Making diet and lifestyle changes can reduce your risk of cerebrovascular disease.  Work with your health care provider to get your risk factors under control to reduce your risk of cerebrovascular disease. This information is not  intended to replace advice given to you by your health care provider. Make sure you discuss any questions you have with your health care provider. Document Released: 10/24/2015 Document Revised: 09/21/2017 Document Reviewed: 10/24/2015 Elsevier Patient Education  2020 [employer].      Your goal blood pressure should be less than 140/90 or less on a regular basis, or medications should be started/ modified.     Normal blood pressure is less than 120/80.    Hypertension Hypertension, commonly called high blood pressure, is when the force of blood pumping through the arteries is too strong. The arteries are the blood vessels that carry blood from the heart throughout the body.  Hypertension forces the heart to work harder to pump blood and may cause arteries to become narrow or stiff. Having untreated or uncontrolled hypertension can cause heart attacks, strokes, kidney disease, and other problems. A blood pressure reading consists of a higher number over a lower number. Ideally, your blood pressure should be below 120/80. The first ("top") number is called the systolic pressure. It is a measure of the pressure in your arteries as your heart beats. The second ("bottom") number is called the diastolic pressure. It is a measure of the pressure in your arteries as the heart relaxes. What are the causes? The cause of this condition is not known. What increases the risk? Some risk factors for high blood pressure are under your control. Others are not. Factors you can change  Smoking.  Having type 2 diabetes mellitus, high cholesterol, or both.  Not getting enough exercise or physical activity.  Being overweight.  Having too much fat, sugar, calories, or salt (sodium) in your diet.  Drinking too much alcohol. Factors that are difficult or impossible to change  Having chronic kidney disease.  Having a family history of high blood pressure.  Age. Risk increases with age.  Race. You may be at higher risk if you are African-American.  Gender. Men are at higher risk than women before age 79. After age 55, women are at higher risk than men.  Having obstructive sleep apnea.  Stress. What are the signs or symptoms? Extremely high blood pressure (hypertensive crisis) may cause:  Headache.  Anxiety.  Shortness of breath.  Nosebleed.  Nausea and vomiting.  Severe chest pain.  Jerky movements you cannot control (seizures).  How is this diagnosed? This condition is diagnosed by measuring your blood pressure while you are seated, with your arm resting on a surface. The cuff of the blood pressure monitor will be placed directly against the skin of your upper  arm at the level of your heart. It should be measured at least twice using the same arm. Certain conditions can cause a difference in blood pressure between your right and left arms. Certain factors can cause blood pressure readings to be lower or higher than normal (elevated) for a short period of time:  When your blood pressure is higher when you are in a health care provider's office than when you are at home, this is called white coat hypertension. Most people with this condition do not need medicines.  When your blood pressure is higher at home than when you are in a health care provider's office, this is called masked hypertension. Most people with this condition may need medicines to control blood pressure.  If you have a high blood pressure reading during one visit or you have normal blood pressure with other risk factors:  You may be asked to return  on a different day to have your blood pressure checked again.  You may be asked to monitor your blood pressure at home for 1 week or longer.  If you are diagnosed with hypertension, you may have other blood or imaging tests to help your health care provider understand your overall risk for other conditions. How is this treated? This condition is treated by making healthy lifestyle changes, such as eating healthy foods, exercising more, and reducing your alcohol intake. Your health care provider may prescribe medicine if lifestyle changes are not enough to get your blood pressure under control, and if:  Your systolic blood pressure is above 130.  Your diastolic blood pressure is above 80.  Your personal target blood pressure may vary depending on your medical conditions, your age, and other factors. Follow these instructions at home: Eating and drinking  Eat a diet that is high in fiber and potassium, and low in sodium, added sugar, and fat. An example eating plan is called the DASH (Dietary Approaches to Stop Hypertension) diet. To eat  this way: ? Eat plenty of fresh fruits and vegetables. Try to fill half of your plate at each meal with fruits and vegetables. ? Eat whole grains, such as whole wheat pasta, brown rice, or whole grain bread. Fill about one quarter of your plate with whole grains. ? Eat or drink low-fat dairy products, such as skim milk or low-fat yogurt. ? Avoid fatty cuts of meat, processed or cured meats, and poultry with skin. Fill about one quarter of your plate with lean proteins, such as fish, chicken without skin, beans, eggs, and tofu. ? Avoid premade and processed foods. These tend to be higher in sodium, added sugar, and fat.  Reduce your daily sodium intake. Most people with hypertension should eat less than 1,500 mg of sodium a day.  Limit alcohol intake to no more than 1 drink a day for nonpregnant women and 2 drinks a day for men. One drink equals 12 oz of beer, 5 oz of wine, or 1 oz of hard liquor. Lifestyle  Work with your health care provider to maintain a healthy body weight or to lose weight. Ask what an ideal weight is for you.  Get at least 30 minutes of exercise that causes your heart to beat faster (aerobic exercise) most days of the week. Activities may include walking, swimming, or biking.  Include exercise to strengthen your muscles (resistance exercise), such as pilates or lifting weights, as part of your weekly exercise routine. Try to do these types of exercises for 30 minutes at least 3 days a week.  Do not use any products that contain nicotine or tobacco, such as cigarettes and e-cigarettes. If you need help quitting, ask your health care provider.  Monitor your blood pressure at home as told by your health care provider.  Keep all follow-up visits as told by your health care provider. This is important. Medicines  Take over-the-counter and prescription medicines only as told by your health care provider. Follow directions carefully. Blood pressure medicines must be taken as  prescribed.  Do not skip doses of blood pressure medicine. Doing this puts you at risk for problems and can make the medicine less effective.  Ask your health care provider about side effects or reactions to medicines that you should watch for. Contact a health care provider if:  You think you are having a reaction to a medicine you are taking.  You have headaches that keep coming back (recurring).  You feel dizzy.  You have swelling in your ankles.  You have trouble with your vision. Get help right away if:  You develop a severe headache or confusion.  You have unusual weakness or numbness.  You feel faint.  You have severe pain in your chest or abdomen.  You vomit repeatedly.  You have trouble breathing. Summary  Hypertension is when the force of blood pumping through your arteries is too strong. If this condition is not controlled, it may put you at risk for serious complications.  Your personal target blood pressure may vary depending on your medical conditions, your age, and other factors. For most people, a normal blood pressure is less than 120/80.  Hypertension is treated with lifestyle changes, medicines, or a combination of both. Lifestyle changes include weight loss, eating a healthy, low-sodium diet, exercising more, and limiting alcohol. This information is not intended to replace advice given to you by your health care provider. Make sure you discuss any questions you have with your health care provider. Document Released: 10/09/2005 Document Revised: 09/06/2016 Document Reviewed: 09/06/2016 Elsevier Interactive Patient Education  2018 [employer].    How to Take Your Blood Pressure   Blood pressure is a measurement of how strongly your blood is pressing against the walls of your arteries. Arteries are blood vessels that carry blood from your heart throughout your body. Your health care provider takes your blood pressure at each office visit. You can also  take your own blood pressure at home with a blood pressure machine. You may need to take your own blood pressure:  To confirm a diagnosis of high blood pressure (hypertension).  To monitor your blood pressure over time.  To make sure your blood pressure medicine is working.  Supplies needed: To take your blood pressure, you will need a blood pressure machine. You can buy a blood pressure machine, or blood pressure monitor, at most drugstores or online. There are several types of home blood pressure monitors. When choosing one, consider the following:  Choose a monitor that has an arm cuff.  Choose a monitor that wraps snugly around your upper arm. You should be able to fit only one finger between your arm and the cuff.  Do not choose a monitor that measures your blood pressure from your wrist or finger.  Your health care provider can suggest a reliable monitor that will meet your needs. How to prepare To get the most accurate reading, avoid the following for 30 minutes before you check your blood pressure:  Drinking caffeine.  Drinking alcohol.  Eating.  Smoking.  Exercising.  Five minutes before you check your blood pressure:  Empty your bladder.  Sit quietly without talking in a dining chair, rather than in a soft couch or armchair.  How to take your blood pressure To check your blood pressure, follow the instructions in the manual that came with your blood pressure monitor. If you have a digital blood pressure monitor, the instructions may be as follows: 1. Sit up straight. 2. Place your feet on the floor. Do not cross your ankles or legs. 3. Rest your left arm at the level of your heart on a table or desk or on the arm of a chair. 4. Pull up your shirt sleeve. 5. Wrap the blood pressure cuff around the upper part of your left arm, 1 inch (2.5 cm) above your elbow. It is best to wrap the cuff around bare skin. 6. Fit the cuff snugly around your  arm. You should be able  to place only one finger between the cuff and your arm. 7. Position the cord inside the groove of your elbow. 8. Press the power button. 9. Sit quietly while the cuff inflates and deflates. 10. Read the digital reading on the monitor screen and write it down (record it). 11. Wait 2-3 minutes, then repeat the steps, starting at step 1.  What does my blood pressure reading mean? A blood pressure reading consists of a higher number over a lower number. Ideally, your blood pressure should be below 120/80. The first ("top") number is called the systolic pressure. It is a measure of the pressure in your arteries as your heart beats. The second ("bottom") number is called the diastolic pressure. It is a measure of the pressure in your arteries as the heart relaxes. Blood pressure is classified into four stages. The following are the stages for adults who do not have a short-term serious illness or a chronic condition. Systolic pressure and diastolic pressure are measured in a unit called mm Hg. Normal  Systolic pressure: below 123456.  Diastolic pressure: below 80. Elevated  Systolic pressure: [id number].  Diastolic pressure: below 80. Hypertension stage 1  Systolic pressure: 0000000.  Diastolic pressure: [id number]. Hypertension stage 2  Systolic pressure: [id number] or above.  Diastolic pressure: 90 or above. You can have prehypertension or hypertension even if only the systolic or only the diastolic number in your reading is higher than normal. Follow these instructions at home:  Check your blood pressure as often as recommended by your health care provider.  Take your monitor to the next appointment with your health care provider to make sure: ? That you are using it correctly. ? That it provides accurate readings.  Be sure you understand what your goal blood pressure numbers are.  Tell your health care provider if you are having any side effects from blood pressure medicine. Contact a health  care provider if:  Your blood pressure is consistently high. Get help right away if:  Your systolic blood pressure is higher than 180.  Your diastolic blood pressure is higher than 110. This information is not intended to replace advice given to you by your health care provider. Make sure you discuss any questions you have with your health care provider. Document Released: 03/17/2016 Document Revised: 05/30/2016 Document Reviewed: 03/17/2016 Elsevier Interactive Patient Education  [employer].

## 2019-07-16 NOTE — ED Provider Notes (Signed)
[facility] EMERGENCY DEPARTMENT Provider Note   CSN: [id number] Arrival date & time: 07/16/19  1734   History   Chief Complaint Chief Complaint  Patient presents with  . Hypertension   HPI Dawn Lawson is a 85 y.o. female with past medical history significant for AKI, heart murmur, hypertension, prediabetes who presents for evaluation of hypertension.  Patient states she was seen at a new primary care office today and sent for evaluation of her elevated blood pressure.  Patient denies any current symptoms.  Denies headache, dizziness, unilateral weakness, chest pain, shortness of breath, hemoptysis, melena, hematochezia. No abdominal pain, diarrhea, dysuria.  Patient states "I do want to be here."  Denies additional aggravating or alleviating factors. Denies orthopnea, PND, syncope. States she sleeps with one pillow at night and occasionally sleeps in a recliner due to back pain.  History obtained from patient, family in room and past medical records.  No interpreter was used.       HPI  Past Medical History:  Diagnosis Date  . AKI (acute kidney injury) ([place]) 03/22/2014  . Anemia   . Arthritis   . ATN (acute tubular necrosis) ([place]) 03/22/2014  . Cataract   . Heart murmur   . Hypertension   . Lactic acidosis 03/22/2014  . PONV (postoperative nausea and vomiting)   . Pre-diabetes   . Sepsis due to group B Streptococcus ([place]) 04/14/2014  . Severe sepsis with acute organ dysfunction ([place]) 03/22/2014  . Status post PICC central line placement 04/14/2014  . Urinary tract infection 03/22/2014    Patient Active Problem List   Diagnosis Date Noted  . h/o Osteomyelitis ([place])- R leg-  07/16/2019  . Class 3 severe obesity with serious comorbidity in adult [facility]) 07/16/2019  . 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, nodules and fibrosis 07/16/2019  . 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 07/16/2019  . Family history of colon cancer in mother- died  age 3; patient refuses colonoscopy 07/16/2019  . Colonoscopy refused 07/16/2019  . Vaccine refused by patient- flu and others 07/16/2019  . Medically noncompliant 07/16/2019  . Hypertensive crisis 07/16/2019  . Abscess of right thigh 02/22/2017  . Macular degeneration 10/14/2014  . Vitamin D deficiency 10/14/2014  . HTN (hypertension) 10/14/2014  . Anemia 10/14/2014  . Arthritis 10/14/2014    Past Surgical History:  Procedure Laterality Date  . ABDOMINAL HYSTERECTOMY     partial  . APPENDECTOMY    . APPLICATION OF WOUND VAC Right 09/18/2016   Procedure: APPLICATION OF WOUND VAC;  Surgeon: Rod Can, MD;  Location: [place];  Service: Orthopedics;  Laterality: Right;  . EXCISIONAL TOTAL KNEE ARTHROPLASTY WITH ANTIBIOTIC SPACERS Right 09/18/2016   Procedure: RESECTION OF RIGHT TOTAL KNEE ARTHROPLASTY PLACEMENT OF ANTIBIOTIC SPACER;  Surgeon: Rod Can, MD;  Location: [place];  Service: Orthopedics;  Laterality: Right;  . EYE SURGERY    . JOINT REPLACEMENT Right    KNEE  . KNEE HARDWARE REMOVAL Right 11/24/2016  . RECTOCELE REPAIR    . TEE WITHOUT CARDIOVERSION N/A 04/09/2014   Procedure: TRANSESOPHAGEAL ECHOCARDIOGRAM (TEE);  Surgeon: Lelon Perla, MD;  Location: [facility] ENDOSCOPY;  Service: Cardiovascular;  Laterality: N/A;  . TOTAL KNEE ARTHROPLASTY WITH REVISION COMPONENTS Right 11/24/2016   Procedure: Removal of right knee spacer, Right knee arthrodesis;  Surgeon: Rod Can, MD;  Location: [place];  Service: Orthopedics;  Laterality: Right;     OB History   No obstetric history on file.  Home Medications    Prior to Admission medications   Medication Sig Start Date End Date Taking? Authorizing Provider  acetaminophen (TYLENOL) 500 MG tablet Take 500-1,000 mg by mouth every 6 (six) hours as needed for moderate pain.   Yes [provider]  aspirin EC 81 MG tablet Take 81 mg by mouth 2 (two) times daily.    Yes [provider]   bisoprolol-hydrochlorothiazide (ZIAC) 5-6.25 MG tablet Take 1 tablet by mouth daily. 01/22/17  Yes [provider]  cephALEXin (KEFLEX) 500 MG capsule Take 1 capsule (500 mg total) by mouth 2 (two) times daily. 11/27/16  Yes [place], Aaron Edelman, MD  Dextran 70-Hypromellose (ARTIFICIAL TEARS PF OP) Place 1 drop into both eyes as needed (for dry eyes).   Yes [provider]  Multiple Vitamins-Minerals (MULTIVITAMIN PO) Take 1 tablet by mouth daily.   Yes [provider]  Multiple Vitamins-Minerals (PRESERVISION AREDS PO) Take 1 capsule by mouth 2 (two) times daily.   Yes [provider]    Family History Family History  Problem Relation Age of Onset  . Heart disease Mother   . Cancer Mother        colon  . Heart attack Father     Social History Social History   Tobacco Use  . Smoking status: Former Smoker    Packs/day: 1.00    Years: 2.00    Pack years: 2.00    Types: Cigarettes    Quit date: 10/23/1976    Years since quitting: 42.7  . Smokeless tobacco: Never Used  Substance Use Topics  . Alcohol use: No    Alcohol/week: 0.0 standard drinks  . Drug use: No     Allergies   Penicillins, Chocolate, Tape, and Ampicillin   Review of Systems Review of Systems  Constitutional: Negative.   HENT: Negative.   Eyes: Negative.   Respiratory: Negative.   Cardiovascular: Negative.   Gastrointestinal: Negative.   Genitourinary: Negative.   Musculoskeletal: Negative.   Neurological: Negative.   All other systems reviewed and are negative.  Physical Exam Updated Vital Signs BP (!) 149/41   Pulse 64   Temp 98.3 F (36.8 C) (Oral)   Resp (!) 23   Ht 5' (1.524 m)   Wt 84.4 kg   SpO2 96%   BMI 36.33 kg/m   Physical Exam  Physical Exam  Constitutional: Pt is oriented to person, place, and time. Pt appears well-developed and well-nourished. No distress.  HENT:  Head: Normocephalic and atraumatic.  Mouth/Throat: Oropharynx is clear and moist.   Eyes: Conjunctivae and EOM are normal. Pupils are equal, round, and reactive to light. No scleral icterus.  No horizontal, vertical or rotational nystagmus  Neck: Normal range of motion. Neck supple.  Full active and passive ROM without pain No midline or paraspinal tenderness No nuchal rigidity or meningeal signs  Cardiovascular: Normal rate, regular rhythm and intact distal pulses.  2+ DP, PT pulses.  Patient with 1+ bilateral pitting edema to knees which is at baseline. Pulmonary/Chest: Effort normal and breath sounds normal. No respiratory distress. Pt has no wheezes. No rales.  Abdominal: Soft. Bowel sounds are normal. There is no tenderness. There is no rebound and no guarding.  Musculoskeletal: Normal range of motion.  Lymphadenopathy:    No cervical adenopathy.  Neurological: Pt. is alert and oriented to person, place, and time. He has normal reflexes. No cranial nerve deficit.  Exhibits normal muscle tone. Coordination normal.  Mental Status:  Alert, oriented, thought content  appropriate. Speech fluent without evidence of aphasia. Able to follow 2 step commands without difficulty.  Cranial Nerves:  II:  Peripheral visual fields grossly normal, pupils equal, round, reactive to light III,IV, VI: ptosis not present, extra-ocular motions intact bilaterally  V,VII: smile symmetric, facial light touch sensation equal VIII: hearing grossly normal bilaterally  IX,X: midline uvula rise  XI: bilateral shoulder shrug equal and strong XII: midline tongue extension  Motor:  5/5 in upper and lower extremities bilaterally including strong and equal grip strength and dorsiflexion/plantar flexion Sensory: Pinprick and light touch normal in all extremities.  Deep Tendon Reflexes: 2+ and symmetric  Cerebellar: normal finger-to-nose with bilateral upper extremities Gait: normal gait and balance CV: distal pulses palpable throughout   Skin: Skin is warm and dry. No rash noted.  Thickened skin to  bilateral lower extremities.  Pt is not diaphoretic.  Psychiatric: Pt has a normal mood and affect. Behavior is normal. Judgment and thought content normal.  Nursing note and vitals reviewed. ED Treatments / Results  Labs (all labs ordered are listed, but only abnormal results are displayed) Labs Reviewed  BASIC METABOLIC PANEL - Abnormal; Notable for the following components:      Result Value   CO2 21 (*)    Glucose, Bld 104 (*)    BUN 24 (*)    GFR calc non Af Amer 51 (*)    GFR calc Af Amer 59 (*)    All other components within normal limits  CBC WITH DIFFERENTIAL/PLATELET    EKG EKG Interpretation  Date/Time:  Wednesday July 16 2019 19:03:37 EDT Ventricular Rate:  72 PR Interval:    QRS Duration: 90 QT Interval:  403 QTC Calculation: 441 R Axis:   28 Text Interpretation:  Sinus rhythm Prolonged PR interval Consider left ventricular hypertrophy Confirmed by Dene Gentry [phone]) on 07/16/2019 7:14:54 PM   Radiology Dg Chest 2 View  Result Date: 07/16/2019 CLINICAL DATA:  Sent by primary care physician for hypertension. EXAM: CHEST - 2 VIEW COMPARISON:  Radiograph 04/23/2014 FINDINGS: Cardiomegaly with slight progression from 2015. Unchanged mediastinal contours with aortic atherosclerosis. No pulmonary edema. No focal airspace disease, pleural effusion, or pneumothorax. Advanced degenerative change of both shoulders. IMPRESSION: 1. Cardiomegaly which is progressed from 2015 comparison. No congestive failure or acute pulmonary process. 2.  Aortic Atherosclerosis (ICD10-I70.0). Electronically Signed   By: Keith Rake M.D.   On: 07/16/2019 19:49    Procedures Procedures (including critical care time)  Medications Ordered in ED Medications  hydrALAZINE (APRESOLINE) injection 10 mg (10 mg Intravenous Given 07/16/19 2045)    Initial Impression / Assessment and Plan / ED Course  I have reviewed the triage vital signs and the nursing notes.  Pertinent labs &  imaging results that were available during my care of the patient were reviewed by me and considered in my medical decision making (see chart for details).  85 year old female appears otherwise well presents for evaluation of hypertension.  She is asymptomatic, without headache, vision changes, nausea, vomiting, chest pain, shortness of breath, unilateral weakness.  She does have 1+ bilateral pitting edema to lower extremities which is at her baseline.  Is afebrile, nonseptic, non-ill-appearing.  Sent from PCP office for evaluation of her elevated blood pressure.  She has been taking her home medications without difficulty.  Will obtain basic labs, EKG and reevaluation.  Patient with systolic blood pressure at 182/82.  Do not think we need to provide antihypertensives at this time.  She did have some  elevation of blood pressure to systolic to [id number].  10 mg hydralazine given with significant improvement.  Patient denies any current symptoms.  She has no rales and clear lung sounds.  Labs without any significant findings.  EKG without ST /T changes.  No STEMI.  Plan film does show some cardiomegaly however no CHF, pulmonary edema.  Low suspicion for CHF, pulmonary edema, ACS, PE, dissection, [place]. Ambulatory without difficulty.   No signs of hypertensive urgency or emergency.  Discussed with patient the need for close follow-up and management by their primary care [facility].   The patient has been appropriately medically screened and/or stabilized in the ED. I have low suspicion for any other emergent medical condition which would require further screening, evaluation or treatment in the ED or require inpatient management.  Patient is hemodynamically stable and in no acute distress.  Patient able to ambulate in department prior to ED.  Evaluation does not show acute pathology that would require ongoing or additional emergent interventions while in the emergency department or further inpatient  treatment.  I have discussed the diagnosis with the patient and answered all questions.  Pain is been managed while in the emergency department and patient has no further complaints prior to discharge.  Patient is comfortable with plan discussed in room and is stable for discharge at this time.  I have discussed strict return precautions for returning to the emergency department.  Patient was encouraged to follow-up with PCP/specialist refer to at discharge.      Final Clinical Impressions(s) / ED Diagnoses   Final diagnoses:  Hypertension, unspecified type    ED Discharge Orders    None       Tallula Grindle A, PA-C 07/16/19 2213    Valarie Merino, MD 07/17/19 2250

## 2019-07-16 NOTE — Discharge Instructions (Signed)
Follow up with your primary care provider in 2 to 3 days.  Have also refer you to cardiology.  You may follow-up with them as needed.

## 2019-07-16 NOTE — Progress Notes (Signed)
New patient office visit note:  Impression and Recommendations:    1. Encounter to establish care with new doctor   2. Hypertensive crisis   3. Class 3 severe obesity with serious comorbidity in adult, unspecified BMI, unspecified obesity type ([place])   4. Lipedema-  Pathologic SAT and nodules/ fibrosis b/l L Ext   5. h/o Other osteomyelitis of tibia, unspecified laterality ([place])   6. Immobility   7. Family history of colon cancer in mother- died age 28; patient refuses colonoscopy   8. Colonoscopy refused   9. Vaccine refused by patient- flu and others   10. Medically noncompliant   11. High risk for diabetes mellitus   12. High risk for colon cancer   13. High risk of cardiac event     -Without further workup and treatment planned, risk to pt's morbidity is high with possible prolonged functional impairment and poor health outcomes.   Encounter to Establish Care with New Doctor - Extensive discussion held with patient regarding establishing as a new patient.  Discussed policies and practices here at the clinic, and answered all questions about care team and health management during appointment.  - Discussed need for patient to continue to obtain management and screenings with all established specialists.  Educated patient at length about the critical importance of keeping health maintenance up to date.  - Participated in lengthy conversation and all questions were answered.   Discussion with Pt Son (Caregiver) regarding Hypertensive Crisis and risks of CV events causing that or being as a result of  - Spoke with son separately, expressed my concern and advised treatment plan of proceeding to the emergency room via ambulance at this time.    Explained to family that she will need to be monitored closely and given IV medications to bring it down slowly and that this was not something we could do in the outpatient setting safely!  - Explained that any blood pressure over  99991111 systolic is considered a hypertensive urgency, thus hers for sure is.  -Explained to family she likely has congestive heart failure as she has symptomatic dyspnea with activity and has 2 pillow orthopnea as well as significant and severe bilateral lower extremity edema with lipedema additionally.  ---> Since she has not received adequate care over the past several years with "blood pressures always being around 170 or so", and patient has had lack of adequate follow-up, I feel there is a significant risk for cardiovascular event if patient is to return home at this time without urgent eval to rule out acute MI, congestive heart failure, chronic kidney disease etc. etc. -Explained to family that you can have rapid and progressive decompensation of vital organ function secondary to severe hypertension such as this.    -Also explained to family blood pressure like this can lead to acute life-threatening complications such as acute MI, hemorrhagic CVA, even death and I was not comfortable sending her home with blood pressures like that  - Patient's son understands the risks of a hypertensive crisis emergency.  Extensively educated pt son that critically high blood pressure can lead to stroke, heart attack, brain bleed, significant cardiovascular events, and danger of immediate death.  Discussed that it would be too risky to send pt home with blood pressure this high.  - * Paramedics arrived in office today to transfer patient to ER today.  - Discussed need for blood pressure to be under 140/90. - Expressed need for pt to be  seen in a month, with full fasting blood work prior. - Advised pt to assist his mother with keeping a blood pressure & pulse log. - If BP remains high and not at goal, discussed adding a second BP med in future.  - Extensive education provided about significant health risks of chronic high blood pressure, especially in a patient of high complexity.  Lengthy discussion was held  and all questions were answered.  - Will continue to monitor.  Recommendations - Discussed need for full fasting lab work in near future. - Follow up in one month for hypertension, with full set of fasting blood work prior. - Patient will bring in BP log with heart rates.   Education and routine counseling performed. Handouts provided.  Pt was interviewed and evaluated by me in the clinic today for 60+ minutes, with over 50% time spent in face to face counseling to patient and family member of patients medical conditions, treatment plans of those medical conditions including medicine management and lifestyle modification, strategies to improve health and well being; and in coordination of care. SEE ABOVE TREATMENT PLAN FOR DETAILS  - Influenza vaccination declined. -Colonoscopy (especially with family history of mother dying age 79) was declined by patient   Expresses verbal understanding and consents to current therapy plan and treatment regimen.  Return for Hypertension follow up in 1 month- bring in BP log with HR's, with FBW 3 d prior.  Please see AVS handed out to patient at the end of our visit for further patient instructions/ counseling done pertaining to today's office visit.    Note:  This document was prepared using Dragon voice recognition software and may include unintentional dictation errors.  This document serves as a record of services personally performed by Mellody Dance, DO. It was created on her behalf by Toni Amend, a trained medical scribe. The creation of this record is based on the scribe's personal observations and the provider's statements to them.   I have reviewed the above medical documentation for accuracy and completeness and I concur.  Mellody Dance, DO 07/16/2019 6:56 PM        ---------------------------------------------------------------------------------------------------------------------------------------------------------------------------------------------    Subjective:    Chief complaint:   Chief Complaint  Patient presents with  . Establish Care     HPI: Dawn Lawson is a pleasant 85 y.o. female who presents to [place] [facility] today to review their medical history with me and establish care.   I asked the patient to review their chronic problem list with me to ensure everything was updated and accurate.    --> I do not have any of her prior PCPs, or specialty office visit notes for my review today   We asked pt to get [place] their medical records from [facility] providers/ specialists that they had seen within the past 3-5 years- if they are in private practice and/or do not work for [employer], [facility], [place], [place] or [employer] owned practice.  Told them to call their specialists to clarify this if they are not sure.    Reason for establishing care: states her other doctor retired- Dr Rex Kras.      Notes "last time we went to him, he wasn't too nice to my son and that upset me."  Says "you come highly recommended by my cousin and other family members."   Social History Says she did a bit of everything in the past when it came to jobs. Worked at [employer]. "Years  ago, worked in the [employer]."  Husband has been deceased for 12 years. Says "he just wouldn't do what the doctor said." Says "he wouldn't take his medicines."  Tobacco Use Says "Oh Lord I ain't smoked in years and years." Confirms hasn't smoked since 1978.  Alcohol Use None  Family History Says mom had "cancer of the bowels." She died at age 77.  Notes mother had congestive heart failure.  Dad died at 80.  Surgical History States had a bone removed from her right leg and can no longer bend that knee.  Past Medical History Patient is in a  wheelchair.    Unable to remember when last she had blood work.  Notes at home, she has a lift chair, a walk-in shower (her daughter-in-law helps with that), "bars at the commode" to help her get up, has life alert, and has additional materials by her bed for assistance.  Says she lives alone.  Says at home, for exercise, she walks around the rooms.  "I don't try to go outside because I might fall."  Notes she walks around the house and "I like to do several things;" states she collects things.  Patient notes she has never gone for a colonoscopy.  States "ain't going to."  - History of Right Leg Bone Removal - Osteomyelitis of Tibia Says she had a bone in her leg get infected, they took it out and put in a steel bar.  Notes she can't bend the leg in question because she has "no kneecap in that leg."  Says "the only specialist I see is Dr. Lyla Glassing, the bone man.   I like him.  I don't have to go back to him for two years."   - Hypertension - in Hypertensive Crisis Today States she takes a blood pressure pill, "just one."  Notes she takes her blood pressure at home but can't remember what it runs.  "With the blood pressure pill it's not very high."  Says she thinks her blood pressure runs 170/80's sometimes.  Says it has also been around 160 at the lowest, but doesn't remember the highest.  "It's been over 200 when I went to the emergency room."  Thinks she's had high blood pressure for several years, "I don't remember how many."  Says "I'm a pretty calm person," especially at home, but "excited today."   - Lower Extremity Swelling States isn't sure how long her legs have been swollen.  States she thinks that the swelling is due to "a lot of salt".   Says at night she has to lay flat and sleep on her back because she has to sleep with her leg elevated.  Sleeps on two pillows with a "couch cushion thing" to help her elevate.   Patient's son states that pt's lower extremities have been  swollen "for years."  At times it is worse  Denies history of heart attack or stroke.  Denies history of high cholesterol.  Denies chest pain or shortness of breath.  Denies history of COPD, emphysema, asthma.  Denies history of skin cancers.    Wt Readings from Last 3 Encounters:  02/22/17 189 lb 2.5 oz (85.8 kg)  11/24/16 174 lb (78.9 kg)  10/31/16 183 lb (83 kg)   BP Readings from Last 3 Encounters:  07/16/19 (!) 182/82  07/16/19 (!) 210/63  02/23/17 (!) 133/47   Pulse Readings from Last 3 Encounters:  07/16/19 73  07/16/19 69  02/23/17 64   BMI Readings from  Last 3 Encounters:  02/22/17 36.94 kg/m  11/24/16 35.14 kg/m  10/31/16 36.04 kg/m    Patient Care Team    Relationship Specialty Notifications Start End  Mellody Dance, DO PCP - General Family Medicine  07/16/19   Milus Height, MD Referring Physician Ophthalmology  10/14/14   Rod Can, MD Consulting Physician Orthopedic Surgery  07/16/19     Patient Active Problem List   Diagnosis Date Noted  . HTN (hypertension) 10/14/2014    Priority: High  . Class 3 severe obesity with serious comorbidity in adult [facility]) 07/16/2019    Priority: Medium  . 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, nodules and fibrosis 07/16/2019    Priority: Medium  . 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 07/16/2019    Priority: Medium  . Anemia 10/14/2014    Priority: Medium  . h/o Osteomyelitis ([place])- R leg-  07/16/2019    Priority: Low  . Vitamin D deficiency 10/14/2014    Priority: Low  . Family history of colon cancer in mother- died age 95; patient refuses colonoscopy 07/16/2019  . Colonoscopy refused 07/16/2019  . Vaccine refused by patient- flu and others 07/16/2019  . Medically noncompliant 07/16/2019  . Hypertensive crisis 07/16/2019  . Abscess of right thigh 02/22/2017  . Macular degeneration 10/14/2014  . Arthritis 10/14/2014     As reported by pt:  Past Medical History:   Diagnosis Date  . AKI (acute kidney injury) ([place]) 03/22/2014  . Anemia   . Arthritis   . ATN (acute tubular necrosis) ([place]) 03/22/2014  . Cataract   . Heart murmur   . Hypertension   . Lactic acidosis 03/22/2014  . PONV (postoperative nausea and vomiting)   . Pre-diabetes   . Sepsis due to group B Streptococcus ([place]) 04/14/2014  . Severe sepsis with acute organ dysfunction ([place]) 03/22/2014  . Status post PICC central line placement 04/14/2014  . Urinary tract infection 03/22/2014     Past Surgical History:  Procedure Laterality Date  . ABDOMINAL HYSTERECTOMY     partial  . APPENDECTOMY    . APPLICATION OF WOUND VAC Right 09/18/2016   Procedure: APPLICATION OF WOUND VAC;  Surgeon: Rod Can, MD;  Location: [place];  Service: Orthopedics;  Laterality: Right;  . EXCISIONAL TOTAL KNEE ARTHROPLASTY WITH ANTIBIOTIC SPACERS Right 09/18/2016   Procedure: RESECTION OF RIGHT TOTAL KNEE ARTHROPLASTY PLACEMENT OF ANTIBIOTIC SPACER;  Surgeon: Rod Can, MD;  Location: [place];  Service: Orthopedics;  Laterality: Right;  . EYE SURGERY    . JOINT REPLACEMENT Right    KNEE  . KNEE HARDWARE REMOVAL Right 11/24/2016  . RECTOCELE REPAIR    . TEE WITHOUT CARDIOVERSION N/A 04/09/2014   Procedure: TRANSESOPHAGEAL ECHOCARDIOGRAM (TEE);  Surgeon: Lelon Perla, MD;  Location[facility] ENDOSCOPY;  Service: Cardiovascular;  Laterality: N/A;  . TOTAL KNEE ARTHROPLASTY WITH REVISION COMPONENTS Right 11/24/2016   Procedure: Removal of right knee spacer, Right knee arthrodesis;  Surgeon: Rod Can, MD;  Location: [place];  Service: Orthopedics;  Laterality: Right;     Family History  Problem Relation Age of Onset  . Heart disease Mother   . Cancer Mother        colon  . Heart attack Father      Social History   Substance and Sexual Activity  Drug Use No     Social History   Substance and Sexual Activity  Alcohol Use No  . Alcohol/week: 0.0 standard drinks     Social History   Tobacco  Use  Smoking Status Former Smoker  . Packs/day: 1.00  . Years: 2.00  . Pack years: 2.00  . Types: Cigarettes  . Quit date: 10/23/1976  . Years since quitting: 42.7  Smokeless Tobacco Never Used     Current Meds  Medication Sig  . acetaminophen (TYLENOL) 500 MG tablet Take 500-1,000 mg by mouth every 6 (six) hours as needed for moderate pain.  Marland Kitchen aspirin EC 81 MG tablet Take 81 mg by mouth 2 (two) times daily.   . bisoprolol-hydrochlorothiazide (ZIAC) 5-6.25 MG tablet Take 1 tablet by mouth daily.  . cephALEXin (KEFLEX) 500 MG capsule Take 1 capsule (500 mg total) by mouth 2 (two) times daily.  . Multiple Vitamins-Minerals (MULTIVITAMIN PO) Take 1 tablet by mouth daily.  . Multiple Vitamins-Minerals (PRESERVISION AREDS PO) Take 1 capsule by mouth 2 (two) times daily.  . polyvinyl alcohol (LIQUIFILM TEARS) 1.4 % ophthalmic solution Place 1 drop into both eyes as needed for dry eyes.    Allergies: Penicillins and Ampicillin   Review of Systems  Constitutional: Negative for chills, diaphoresis, fever, malaise/fatigue and weight loss.  HENT: Negative for congestion, sore throat and tinnitus.   Eyes: Negative for blurred vision, double vision, photophobia and redness.  Respiratory: Positive for shortness of breath. Negative for cough and wheezing.        Has 2 pillow orthopnea "for some time now", swelling in legs b/l  Cardiovascular: Positive for leg swelling. Negative for chest pain and palpitations.       Peripheral edema and lipedema  Gastrointestinal: Negative for blood in stool, diarrhea, nausea and vomiting.  Genitourinary: Negative for dysuria, frequency and urgency.  Musculoskeletal: Negative for joint pain and myalgias.  Skin: Negative for itching and rash.  Neurological: Negative for dizziness, focal weakness, weakness and headaches.  Endo/Heme/Allergies: Negative for environmental allergies and polydipsia. Does not bruise/bleed easily.  Psychiatric/Behavioral: Negative for  depression and memory loss. The patient is not nervous/anxious and does not have insomnia.     Objective:   Blood pressure (!) 210/63, pulse 69, temperature 98.3 F (36.8 C), temperature source Oral, SpO2 99 %. There is no height or weight on file to calculate BMI. General: Patient is wheelchair-bound.  Well Developed, well nourished, and in no acute distress.  Neuro: Alert and oriented x3, extra-ocular muscles intact, sensation grossly intact.  HEENT:[place]/AT, PERRLA, neck supple, No carotid bruits Skin: no gross rashes  Cardiac: Regular rate and rhythm Respiratory: Essentially clear to auscultation bilaterally. Not using accessory muscles, speaking in full sentences.  Abdominal: not grossly distended Musculoskeletal: Ambulates w/o diff, FROM * 4 ext.  Vasc: less 2 sec cap RF, warm and pink  Psych:  No HI/SI, judgement and insight good, Euthymic mood. Full Affect. Bilateral Lower Extremity:  Severe lipedema with subcutaneously nodules in bilateral lower extremity.

## 2019-07-21 ENCOUNTER — Ambulatory Visit (INDEPENDENT_AMBULATORY_CARE_PROVIDER_SITE_OTHER): Payer: Medicare Other | Admitting: Family Medicine

## 2019-07-21 ENCOUNTER — Other Ambulatory Visit: Payer: Self-pay

## 2019-07-21 ENCOUNTER — Encounter: Payer: Self-pay | Admitting: Family Medicine

## 2019-07-21 VITALS — BP 168/68 | HR 62 | Ht 60.0 in | Wt 186.0 lb

## 2019-07-21 DIAGNOSIS — I119 Hypertensive heart disease without heart failure: Secondary | ICD-10-CM | POA: Insufficient documentation

## 2019-07-21 DIAGNOSIS — I169 Hypertensive crisis, unspecified: Secondary | ICD-10-CM

## 2019-07-21 DIAGNOSIS — Z09 Encounter for follow-up examination after completed treatment for conditions other than malignant neoplasm: Secondary | ICD-10-CM

## 2019-07-21 DIAGNOSIS — D649 Anemia, unspecified: Secondary | ICD-10-CM

## 2019-07-21 DIAGNOSIS — I10 Essential (primary) hypertension: Secondary | ICD-10-CM | POA: Diagnosis not present

## 2019-07-21 MED ORDER — LOSARTAN POTASSIUM 50 MG PO TABS: ORAL_TABLET | ORAL | 3 refills | Status: DC

## 2019-07-21 NOTE — Patient Instructions (Signed)
Hypertension, Adult High blood pressure (hypertension) is when the force of blood pumping through the arteries is too strong. The arteries are the blood vessels that carry blood from the heart throughout the body. Hypertension forces the heart to work harder to pump blood and may cause arteries to become narrow or stiff. Untreated or uncontrolled hypertension can cause a heart attack, heart failure, a stroke, kidney disease, and other problems. A blood pressure reading consists of a higher number over a lower number. Ideally, your blood pressure should be below 120/80. The first ("top") number is called the systolic pressure. It is a measure of the pressure in your arteries as your heart beats. The second ("bottom") number is called the diastolic pressure. It is a measure of the pressure in your arteries as the heart relaxes. What are the causes? The exact cause of this condition is not known. There are some conditions that result in or are related to high blood pressure. What increases the risk? Some risk factors for high blood pressure are under your control. The following factors may make you more likely to develop this condition:  Smoking.  Having type 2 diabetes mellitus, high cholesterol, or both.  Not getting enough exercise or physical activity.  Being overweight.  Having too much fat, sugar, calories, or salt (sodium) in your diet.  Drinking too much alcohol. Some risk factors for high blood pressure may be difficult or impossible to change. Some of these factors include:  Having chronic kidney disease.  Having a family history of high blood pressure.  Age. Risk increases with age.  Race. You may be at higher risk if you are African American.  Gender. Men are at higher risk than women before age 45. After age 65, women are at higher risk than men.  Having obstructive sleep apnea.  Stress. What are the signs or symptoms? High blood pressure may not cause symptoms. Very high  blood pressure (hypertensive crisis) may cause:  Headache.  Anxiety.  Shortness of breath.  Nosebleed.  Nausea and vomiting.  Vision changes.  Severe chest pain.  Seizures. How is this diagnosed? This condition is diagnosed by measuring your blood pressure while you are seated, with your arm resting on a flat surface, your legs uncrossed, and your feet flat on the floor. The cuff of the blood pressure monitor will be placed directly against the skin of your upper arm at the level of your heart. It should be measured at least twice using the same arm. Certain conditions can cause a difference in blood pressure between your right and left arms. Certain factors can cause blood pressure readings to be lower or higher than normal for a short period of time:  When your blood pressure is higher when you are in a health care provider's office than when you are at home, this is called white coat hypertension. Most people with this condition do not need medicines.  When your blood pressure is higher at home than when you are in a health care provider's office, this is called masked hypertension. Most people with this condition may need medicines to control blood pressure. If you have a high blood pressure reading during one visit or you have normal blood pressure with other risk factors, you may be asked to:  Return on a different day to have your blood pressure checked again.  Monitor your blood pressure at home for 1 week or longer. If you are diagnosed with hypertension, you may have other blood or   imaging tests to help your health care provider understand your overall risk for other conditions. How is this treated? This condition is treated by making healthy lifestyle changes, such as eating healthy foods, exercising more, and reducing your alcohol intake. Your health care provider may prescribe medicine if lifestyle changes are not enough to get your blood pressure under control, and if:   Your systolic blood pressure is above 130.  Your diastolic blood pressure is above 80. Your personal target blood pressure may vary depending on your medical conditions, your age, and other factors. Follow these instructions at home: Eating and drinking   Eat a diet that is high in fiber and potassium, and low in sodium, added sugar, and fat. An example eating plan is called the DASH (Dietary Approaches to Stop Hypertension) diet. To eat this way: ? Eat plenty of fresh fruits and vegetables. Try to fill one half of your plate at each meal with fruits and vegetables. ? Eat whole grains, such as whole-wheat pasta, brown rice, or whole-grain bread. Fill about one fourth of your plate with whole grains. ? Eat or drink low-fat dairy products, such as skim milk or low-fat yogurt. ? Avoid fatty cuts of meat, processed or cured meats, and poultry with skin. Fill about one fourth of your plate with lean proteins, such as fish, chicken without skin, beans, eggs, or tofu. ? Avoid pre-made and processed foods. These tend to be higher in sodium, added sugar, and fat.  Reduce your daily sodium intake. Most people with hypertension should eat less than 1,500 mg of sodium a day.  Do not drink alcohol if: ? Your health care provider tells you not to drink. ? You are pregnant, may be pregnant, or are planning to become pregnant.  If you drink alcohol: ? Limit how much you use to:  0-1 drink a day for women.  0-2 drinks a day for men. ? Be aware of how much alcohol is in your drink. In the [place] one drink equals one 12 oz bottle of beer (355 mL), one 5 oz glass of wine (148 mL), or one 1 oz glass of hard liquor (44 mL). Lifestyle   Work with your health care provider to maintain a healthy body weight or to lose weight. Ask what an ideal weight is for you.  Get at least 30 minutes of exercise most days of the week. Activities may include walking, swimming, or biking.  Include exercise to strengthen  your muscles (resistance exercise), such as Pilates or lifting weights, as part of your weekly exercise routine. Try to do these types of exercises for 30 minutes at least 3 days a week.  Do not use any products that contain nicotine or tobacco, such as cigarettes, e-cigarettes, and chewing tobacco. If you need help quitting, ask your health care provider.  Monitor your blood pressure at home as told by your health care provider.  Keep all follow-up visits as told by your health care provider. This is important. Medicines  Take over-the-counter and prescription medicines only as told by your health care provider. Follow directions carefully. Blood pressure medicines must be taken as prescribed.  Do not skip doses of blood pressure medicine. Doing this puts you at risk for problems and can make the medicine less effective.  Ask your health care provider about side effects or reactions to medicines that you should watch for. Contact a health care provider if you:  Think you are having a reaction to a medicine you   are taking.  Have headaches that keep coming back (recurring).  Feel dizzy.  Have swelling in your ankles.  Have trouble with your vision. Get help right away if you:  Develop a severe headache or confusion.  Have unusual weakness or numbness.  Feel faint.  Have severe pain in your chest or abdomen.  Vomit repeatedly.  Have trouble breathing. Summary  Hypertension is when the force of blood pumping through your arteries is too strong. If this condition is not controlled, it may put you at risk for serious complications.  Your personal target blood pressure may vary depending on your medical conditions, your age, and other factors. For most people, a normal blood pressure is less than 120/80.  Hypertension is treated with lifestyle changes, medicines, or a combination of both. Lifestyle changes include losing weight, eating a healthy, low-sodium diet, exercising  more, and limiting alcohol. This information is not intended to replace advice given to you by your health care provider. Make sure you discuss any questions you have with your health care provider. Document Released: 10/09/2005 Document Revised: 06/19/2018 Document Reviewed: 06/19/2018 Elsevier Patient Education  2020 [place] Your Hypertension Hypertension is commonly called high blood pressure. This is when the force of your blood pressing against the walls of your arteries is too strong. Arteries are blood vessels that carry blood from your heart throughout your body. Hypertension forces the heart to work harder to pump blood, and may cause the arteries to become narrow or stiff. Having untreated or uncontrolled hypertension can cause heart attack, stroke, kidney disease, and other problems. What are blood pressure readings? A blood pressure reading consists of a higher number over a lower number. Ideally, your blood pressure should be below 120/80. The first ("top") number is called the systolic pressure. It is a measure of the pressure in your arteries as your heart beats. The second ("bottom") number is called the diastolic pressure. It is a measure of the pressure in your arteries as the heart relaxes. What does my blood pressure reading mean? Blood pressure is classified into four stages. Based on your blood pressure reading, your health care provider may use the following stages to determine what type of treatment you need, if any. Systolic pressure and diastolic pressure are measured in a unit called mm Hg. Normal  Systolic pressure: below 123456.  Diastolic pressure: below 80. Elevated  Systolic pressure: [id number].  Diastolic pressure: below 80. Hypertension stage 1  Systolic pressure: 0000000.  Diastolic pressure: [id number]. Hypertension stage 2  Systolic pressure: [id number] or above.  Diastolic pressure: 90 or above. What health risks are associated with  hypertension? Managing your hypertension is an important responsibility. Uncontrolled hypertension can lead to:  A heart attack.  A stroke.  A weakened blood vessel (aneurysm).  Heart failure.  Kidney damage.  Eye damage.  Metabolic syndrome.  Memory and concentration problems. What changes can I make to manage my hypertension? Hypertension can be managed by making lifestyle changes and possibly by taking medicines. Your health care provider will help you make a plan to bring your blood pressure within a normal range. Eating and drinking   Eat a diet that is high in fiber and potassium, and low in salt (sodium), added sugar, and fat. An example eating plan is called the DASH (Dietary Approaches to Stop Hypertension) diet. To eat this way: ? Eat plenty of fresh fruits and vegetables. Try to fill half of your plate at each meal  with fruits and vegetables. ? Eat whole grains, such as whole wheat pasta, brown rice, or whole grain bread. Fill about one quarter of your plate with whole grains. ? Eat low-fat diary products. ? Avoid fatty cuts of meat, processed or cured meats, and poultry with skin. Fill about one quarter of your plate with lean proteins such as fish, chicken without skin, beans, eggs, and tofu. ? Avoid premade and processed foods. These tend to be higher in sodium, added sugar, and fat.  Reduce your daily sodium intake. Most people with hypertension should eat less than 1,500 mg of sodium a day.  Limit alcohol intake to no more than 1 drink a day for nonpregnant women and 2 drinks a day for men. One drink equals 12 oz of beer, 5 oz of wine, or 1 oz of hard liquor. Lifestyle  Work with your health care provider to maintain a healthy body weight, or to lose weight. Ask what an ideal weight is for you.  Get at least 30 minutes of exercise that causes your heart to beat faster (aerobic exercise) most days of the week. Activities may include walking, swimming, or biking.   Include exercise to strengthen your muscles (resistance exercise), such as weight lifting, as part of your weekly exercise routine. Try to do these types of exercises for 30 minutes at least 3 days a week.  Do not use any products that contain nicotine or tobacco, such as cigarettes and e-cigarettes. If you need help quitting, ask your health care provider.  Control any long-term (chronic) conditions you have, such as high cholesterol or diabetes. Monitoring  Monitor your blood pressure at home as told by your health care provider. Your personal target blood pressure may vary depending on your medical conditions, your age, and other factors.  Have your blood pressure checked regularly, as often as told by your health care provider. Working with your health care provider  Review all the medicines you take with your health care provider because there may be side effects or interactions.  Talk with your health care provider about your diet, exercise habits, and other lifestyle factors that may be contributing to hypertension.  Visit your health care provider regularly. Your health care provider can help you create and adjust your plan for managing hypertension. Will I need medicine to control my blood pressure? Your health care provider may prescribe medicine if lifestyle changes are not enough to get your blood pressure under control, and if:  Your systolic blood pressure is 130 or higher.  Your diastolic blood pressure is 80 or higher. Take medicines only as told by your health care provider. Follow the directions carefully. Blood pressure medicines must be taken as prescribed. The medicine does not work as well when you skip doses. Skipping doses also puts you at risk for problems. Contact a health care provider if:  You think you are having a reaction to medicines you have taken.  You have repeated (recurrent) headaches.  You feel dizzy.  You have swelling in your ankles.  You  have trouble with your vision. Get help right away if:  You develop a severe headache or confusion.  You have unusual weakness or numbness, or you feel faint.  You have severe pain in your chest or abdomen.  You vomit repeatedly.  You have trouble breathing. Summary  Hypertension is when the force of blood pumping through your arteries is too strong. If this condition is not controlled, it may put you at risk  for serious complications.  Your personal target blood pressure may vary depending on your medical conditions, your age, and other factors. For most people, a normal blood pressure is less than 120/80.  Hypertension is managed by lifestyle changes, medicines, or both. Lifestyle changes include weight loss, eating a healthy, low-sodium diet, exercising more, and limiting alcohol. This information is not intended to replace advice given to you by your health care provider. Make sure you discuss any questions you have with your health care provider. Document Released: 07/03/2012 Document Revised: 01/31/2019 Document Reviewed: 09/06/2016 Elsevier Patient Education  2020 [employer].

## 2019-07-21 NOTE — Progress Notes (Signed)
Impression and Recommendations:    1. Hospital discharge follow-up   2. Hypertensive crisis- now resolved   3. Hypertension, unspecified type   4. Hypertensive cardiomegaly   5. Class 3 severe obesity with serious comorbidity in adult, unspecified BMI, unspecified obesity type ([name])   6. Anemia, unspecified type      Hospital Discharge f/up - Hypertensive Crisis, now resolved - Was seen in ER for acutely elevated blood pressure. - Pt was given hydralazine at that time.  -Regarding pt's recent hospitalization and/or ED visit: reviewed in great detail recent hospitalization notes, clinical lab tests, tests in the radiology section of [facility], tests in the medicine testing of [facility], and obtained history from family member.  -Independent visualization of image, tracing, or specimen itself done by me today at point of care delivery; directly influenced my plan of care for patient. Moderate complexity  - Discussed that pt's recent imaging showed cardiomegaly with low suspicion for CHF.  DG CHEST 2 VIEW CLINICAL DATA:  Sent by primary care physician for hypertension.  EXAM: CHEST - 2 VIEW  COMPARISON:  Radiograph 04/23/2014  FINDINGS: Cardiomegaly with slight progression from 2015. Unchanged mediastinal contours with aortic atherosclerosis. No pulmonary edema. No focal airspace disease, pleural effusion, or pneumothorax. Advanced degenerative change of both shoulders.  IMPRESSION: 1. Cardiomegaly which is progressed from 2015 comparison. No congestive failure or acute pulmonary process. 2.  Aortic Atherosclerosis (ICD10-I70.0).   Electronically Signed   By: Keith Rake M.D.   On: 07/16/2019 19:49  - Red flag cardiac symptoms reviewed with patient today. - Patient and family know to seek emergency care if red flag sx occur. - Extensive review provided and all questions were answered.   - Patient and family would like to avoid going to cardiology if at all  possible.  Hypertension - BP elevated in office today. - Ambulatory BP not optimally controlled. - Discussed goal to bring BP down slowly and for stable control.  - Advised beginning additional BP medication today.  See med list. - Patient does not know of any BP medications that cause her intolerable S-E. - Reviewed critical importance of tapering dose up slowly from a half tablet to start with. - Take half tablet for 8 days, monitoring BP every day.  Then begin full tablet.  - Continue existing medication as prescribed. - Given her lower pulses, patient knows to watch closely for S-E such as dizziness.  - Prudent lifestyle changes such as dash diet and engaging in a regular exercise program discussed with patient.  Advised patient to exercise as much as possible as tolerated. - Extensively discussed avoiding sodium in diet. - Education provided and all questions answered.  - Ambulatory BP and pulse monitoring encouraged. Keep log and bring in next OV. - Discussed prudent BP monitoring habits with patient today. - Reviewed that she must be sitting for 15-20 minutes quietly before checking BP.  Recommendations - Discussed that when we change treatments, there is a need to keep a closer eye on the patient. - Return in one month for follow up.   No orders of the defined types were placed in this encounter.   Meds ordered this encounter  Medications  . losartan (COZAAR) 50 MG tablet    Sig: 1/2 tab po qd for 8 d, then 1 po qd 8 d, and inc to 2 po qd only if BP higher than 140/90 consistently    Dispense:  90 tablet    Refill:  3    There are no discontinued medications.   Gross side effects, risk and benefits, and alternatives of medications and treatment plan in general discussed with patient.  Patient is aware that all medications have potential side effects and we are unable to predict every side effect or drug-drug interaction that may occur.   Patient will call with any  questions prior to using medication if they have concerns.    Expresses verbal understanding and consents to current therapy and treatment regimen.  No barriers to understanding were identified.  Red flag symptoms and signs discussed in detail.  Patient expressed understanding regarding what to do in case of emergency\urgent symptoms  Please see AVS handed out to patient at the end of our visit for further patient instructions/ counseling done pertaining to today's office visit.   Return for keep f/up 3-4wks with me,bring in home BP's.     Note:  This note was prepared with assistance of Dragon voice recognition software. Occasional wrong-word or sound-a-like substitutions may have occurred due to the inherent limitations of voice recognition software.   This document serves as a record of services personally performed by Mellody Dance, DO. It was created on her behalf by Toni Amend, a trained medical scribe. The creation of this record is based on the scribe's personal observations and the provider's statements to them.   I have reviewed the above medical documentation for accuracy and completeness and I concur.  Mellody Dance, DO 07/22/2019 6:30 PM        --------------------------------------------------------------------------------------------------------------------------------------------------------------------------------------------------------------------------------------------    Subjective:     HPI: Dawn Lawson is a 85 y.o. female who presents to [place] at [facility] today for issues as discussed below.  - F/up after Hospital Visit for Acutely Elevated BP Patient confirms she was given medications at the ER to make sure she was not having a heart attack.  States she has been checking her BP at home since hospital visit.  Son states "it's running 170's, sometimes even lower than that.  I guess it depends on what she does."  Patient  says she checks in the evening now.  Feels her BP is "about the same (in the 160's), maybe a little lower."  Son says "169, 179 is the highest it gets; the worst is 180-something."  BP not optimally controlled at home.  Says "I don't really have a headache."  Notes the only time she has a headache is if she eats chocolate, noting "that gives me a migraine."  She doesn't think that caffeine in general bothers her.  Son says "she doesn't drink much water," and she states "I don't like water."  She drinks coffee, milk in her cereal, and "some water."  Notes "I like diet drinks, caffeine free."  - Patient reports good compliance with blood pressure medications  - Denies medication S-E   - Smoking Status noted   - She denies new onset of: chest pain, exercise intolerance, shortness of breath, dizziness, visual changes, headache, lower extremity swelling or claudication.   Last 3 blood pressure readings in our office are as follows: BP Readings from Last 3 Encounters:  07/21/19 (!) 168/68  07/16/19 (!) 166/48  07/16/19 (!) 210/63    Filed Weights   07/21/19 1347  Weight: 186 lb (84.4 kg)    Recent Results (from the past 2160 hour(s))  CBC with Differential     Status: None   Collection Time: 07/16/19  7:25 PM  Result Value Ref  Range   WBC 8.3 4.0 - 10.5 K/uL   RBC 4.21 3.87 - 5.11 MIL/uL   Hemoglobin 12.3 12.0 - 15.0 g/dL   HCT 39.4 36.0 - 46.0 %   MCV 93.6 80.0 - 100.0 fL   MCH 29.2 26.0 - 34.0 pg   MCHC 31.2 30.0 - 36.0 g/dL   RDW 13.2 11.5 - 15.5 %   Platelets 212 150 - 400 K/uL   nRBC 0.0 0.0 - 0.2 %   Neutrophils Relative % 68 %   Neutro Abs 5.6 1.7 - 7.7 K/uL   Lymphocytes Relative 19 %   Lymphs Abs 1.5 0.7 - 4.0 K/uL   Monocytes Relative 11 %   Monocytes Absolute 0.9 0.1 - 1.0 K/uL   Eosinophils Relative 1 %   Eosinophils Absolute 0.1 0.0 - 0.5 K/uL   Basophils Relative 1 %   Basophils Absolute 0.1 0.0 - 0.1 K/uL   Immature Granulocytes 0 %   Abs Immature  Granulocytes 0.03 0.00 - 0.07 K/uL    Comment: Performed at [facility] [address], [place], [place] [id number]  Basic metabolic panel     Status: Abnormal   Collection Time: 07/16/19  7:25 PM  Result Value Ref Range   Sodium 136 135 - 145 mmol/L   Potassium 4.2 3.5 - 5.1 mmol/L   Chloride 100 98 - 111 mmol/L   CO2 21 (L) 22 - 32 mmol/L   Glucose, Bld 104 (H) 70 - 99 mg/dL   BUN 24 (H) 8 - 23 mg/dL   Creatinine, Ser 0.99 0.44 - 1.00 mg/dL   Calcium 9.3 8.9 - 10.3 mg/dL   GFR calc non Af Amer 51 (L) >60 mL/min   GFR calc Af Amer 59 (L) >60 mL/min   Anion gap 15 5 - 15    Comment: Performed at [facility] [address] [place], [place] [address]      Wt Readings from Last 3 Encounters:  07/21/19 186 lb (84.4 kg)  07/16/19 186 lb (84.4 kg)  02/22/17 189 lb 2.5 oz (85.8 kg)   BP Readings from Last 3 Encounters:  07/21/19 (!) 168/68  07/16/19 (!) 166/48  07/16/19 (!) 210/63   Pulse Readings from Last 3 Encounters:  07/21/19 62  07/16/19 68  07/16/19 69   BMI Readings from Last 3 Encounters:  07/21/19 36.33 kg/m  07/16/19 36.33 kg/m  02/22/17 36.94 kg/m     Patient Care Team    Relationship Specialty Notifications Start End  Mellody Dance, DO PCP - General Family Medicine  07/16/19   Milus Height, MD Referring Physician Ophthalmology  10/14/14   Rod Can, MD Consulting Physician Orthopedic Surgery  07/16/19      Patient Active Problem List   Diagnosis Date Noted  . 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    Priority: High  . HTN (hypertension) 10/14/2014    Priority: High  . Class 3 severe obesity with serious comorbidity in adult [facility]) 07/16/2019    Priority: Medium  . 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, nodules and fibrosis 07/16/2019    Priority: Medium  . 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 07/16/2019    Priority: Medium  . Anemia 10/14/2014    Priority: Medium  . h/o Osteomyelitis ([place])- R leg-   07/16/2019    Priority: Low  . Vitamin D deficiency 10/14/2014    Priority: Low  . Family history of colon cancer in mother- died age 26; patient refuses colonoscopy 07/16/2019  .  Colonoscopy refused 07/16/2019  . Vaccine refused by patient- flu and others 07/16/2019  . Medically noncompliant 07/16/2019  . Hypertensive crisis 07/16/2019  . Abscess of right thigh 02/22/2017  . Macular degeneration 10/14/2014  . Arthritis 10/14/2014    Past Medical history, Surgical history, Family history, Social history, Allergies and Medications have been entered into the medical record, reviewed and changed as needed.    Current Meds  Medication Sig  . acetaminophen (TYLENOL) 500 MG tablet Take 500-1,000 mg by mouth every 6 (six) hours as needed for moderate pain.  Marland Kitchen aspirin EC 81 MG tablet Take 81 mg by mouth 2 (two) times daily.   . bisoprolol-hydrochlorothiazide (ZIAC) 5-6.25 MG tablet Take 1 tablet by mouth daily.  . cephALEXin (KEFLEX) 500 MG capsule Take 1 capsule (500 mg total) by mouth 2 (two) times daily.  Marland Kitchen Dextran 70-Hypromellose (ARTIFICIAL TEARS PF OP) Place 1 drop into both eyes as needed (for dry eyes).  . Multiple Vitamins-Minerals (MULTIVITAMIN PO) Take 1 tablet by mouth daily.  . Multiple Vitamins-Minerals (PRESERVISION AREDS PO) Take 1 capsule by mouth 2 (two) times daily.    Allergies:  Allergies  Allergen Reactions  . Penicillins Hives and Itching    Tolerated Ancef 11/17 and cefepime 02/2017  Has patient had a PCN reaction causing immediate rash, facial/tongue/throat swelling, SOB or lightheadedness with hypotension: Yes Has patient had a PCN reaction causing severe rash involving mucus membranes or skin necrosis: No Has patient had a PCN reaction that required hospitalization: No Has patient had a PCN reaction occurring within the last 10 years:  # # YES # #  If all of the above answers are "NO", then may proceed with Cephalosporin use.  . Chocolate Other (See Comments)     Migraines   . Tape Itching  . Ampicillin Hives and Itching     Review of Systems:  A fourteen system review of systems was performed and found to be positive as per HPI.   Objective:   Blood pressure (!) 168/68, pulse 62, height 5' (1.524 m), weight 186 lb (84.4 kg), SpO2 96 %. Body mass index is 36.33 kg/m. General:  Well Developed, well nourished, appropriate for stated age.  Neuro:  Alert and oriented,  extra-ocular muscles intact  HEENT:  Normocephalic, atraumatic, neck supple, no carotid bruits appreciated  Skin:  no gross rash, warm, pink. Cardiac:  RRR, S1 S2 Respiratory:  ECTA B/L and A/P, Not using accessory muscles, speaking in full sentences- unlabored. Vascular:  Ext warm, no cyanosis apprec.; cap RF less 2 sec. Psych:  No HI/SI, judgement and insight good, Euthymic mood. Full Affect.

## 2019-08-15 ENCOUNTER — Other Ambulatory Visit: Payer: Self-pay

## 2019-08-15 ENCOUNTER — Other Ambulatory Visit: Payer: Medicare Other

## 2019-08-15 DIAGNOSIS — Z833 Family history of diabetes mellitus: Secondary | ICD-10-CM

## 2019-08-15 DIAGNOSIS — R609 Edema, unspecified: Secondary | ICD-10-CM

## 2019-08-15 DIAGNOSIS — I169 Hypertensive crisis, unspecified: Secondary | ICD-10-CM

## 2019-08-15 DIAGNOSIS — R739 Hyperglycemia, unspecified: Secondary | ICD-10-CM

## 2019-08-15 DIAGNOSIS — Z7409 Other reduced mobility: Secondary | ICD-10-CM

## 2019-08-15 DIAGNOSIS — E66813 Obesity, class 3: Secondary | ICD-10-CM

## 2019-08-15 DIAGNOSIS — Z9189 Other specified personal risk factors, not elsewhere classified: Secondary | ICD-10-CM

## 2019-08-16 LAB — COMPREHENSIVE METABOLIC PANEL
ALT: 13 IU/L (ref 0–32)
AST: 18 IU/L (ref 0–40)
Albumin/Globulin Ratio: 1.6 (ref 1.2–2.2)
Albumin: 4.2 g/dL (ref 3.6–4.6)
Alkaline Phosphatase: 73 IU/L (ref 39–117)
BUN/Creatinine Ratio: 26 (ref 12–28)
BUN: 27 mg/dL (ref 8–27)
Bilirubin Total: 0.3 mg/dL (ref 0.0–1.2)
CO2: 22 mmol/L (ref 20–29)
Calcium: 9.2 mg/dL (ref 8.7–10.3)
Chloride: 102 mmol/L (ref 96–106)
Creatinine, Ser: 1.05 mg/dL — ABNORMAL HIGH (ref 0.57–1.00)
GFR calc Af Amer: 55 mL/min/{1.73_m2} — ABNORMAL LOW (ref >59)
GFR calc non Af Amer: 48 mL/min/{1.73_m2} — ABNORMAL LOW (ref >59)
Globulin, Total: 2.7 g/dL (ref 1.5–4.5)
Glucose: 154 mg/dL — ABNORMAL HIGH (ref 65–99)
Potassium: 4.4 mmol/L (ref 3.5–5.2)
Sodium: 139 mmol/L (ref 134–144)
Total Protein: 6.9 g/dL (ref 6.0–8.5)

## 2019-08-16 LAB — LIPID PANEL
Chol/HDL Ratio: 5.8 ratio — ABNORMAL HIGH (ref 0.0–4.4)
Cholesterol, Total: 186 mg/dL (ref 100–199)
HDL: 32 mg/dL — ABNORMAL LOW (ref >39)
LDL Chol Calc (NIH): 116 mg/dL — ABNORMAL HIGH (ref 0–99)
Triglycerides: 214 mg/dL — ABNORMAL HIGH (ref 0–149)
VLDL Cholesterol Cal: 38 mg/dL (ref 5–40)

## 2019-08-16 LAB — CBC WITH DIFFERENTIAL/PLATELET
Basophils Absolute: 0.1 10*3/uL (ref 0.0–0.2)
Basos: 1 %
EOS (ABSOLUTE): 0.4 10*3/uL (ref 0.0–0.4)
Eos: 6 %
Hematocrit: 34.3 % (ref 34.0–46.6)
Hemoglobin: 11.5 g/dL (ref 11.1–15.9)
Immature Grans (Abs): 0 10*3/uL (ref 0.0–0.1)
Immature Granulocytes: 0 %
Lymphocytes Absolute: 1.3 10*3/uL (ref 0.7–3.1)
Lymphs: 20 %
MCH: 29.9 pg (ref 26.6–33.0)
MCHC: 33.5 g/dL (ref 31.5–35.7)
MCV: 89 fL (ref 79–97)
Monocytes Absolute: 0.7 10*3/uL (ref 0.1–0.9)
Monocytes: 10 %
Neutrophils Absolute: 4.3 10*3/uL (ref 1.4–7.0)
Neutrophils: 63 %
Platelets: 213 10*3/uL (ref 150–450)
RBC: 3.85 x10E6/uL (ref 3.77–5.28)
RDW: 13.1 % (ref 11.7–15.4)
WBC: 6.7 10*3/uL (ref 3.4–10.8)

## 2019-08-16 LAB — T4, FREE: Free T4: 1.15 ng/dL (ref 0.82–1.77)

## 2019-08-16 LAB — HEMOGLOBIN A1C
Est. average glucose Bld gHb Est-mCnc: 131 mg/dL
Hgb A1c MFr Bld: 6.2 % — ABNORMAL HIGH (ref 4.8–5.6)

## 2019-08-16 LAB — PHOSPHORUS: Phosphorus: 4.3 mg/dL (ref 3.0–4.3)

## 2019-08-16 LAB — MAGNESIUM: Magnesium: 2.2 mg/dL (ref 1.6–2.3)

## 2019-08-16 LAB — TSH: TSH: 2.18 u[IU]/mL (ref 0.450–4.500)

## 2019-08-16 LAB — T3: T3, Total: 114 ng/dL (ref 71–180)

## 2019-08-19 ENCOUNTER — Other Ambulatory Visit: Payer: Self-pay

## 2019-08-19 ENCOUNTER — Ambulatory Visit (INDEPENDENT_AMBULATORY_CARE_PROVIDER_SITE_OTHER): Payer: Medicare Other | Admitting: Family Medicine

## 2019-08-19 ENCOUNTER — Encounter: Payer: Self-pay | Admitting: Family Medicine

## 2019-08-19 VITALS — BP 149/52 | HR 52 | Ht 60.0 in | Wt 186.0 lb

## 2019-08-19 DIAGNOSIS — R7303 Prediabetes: Secondary | ICD-10-CM | POA: Insufficient documentation

## 2019-08-19 DIAGNOSIS — I169 Hypertensive crisis, unspecified: Secondary | ICD-10-CM

## 2019-08-19 DIAGNOSIS — E782 Mixed hyperlipidemia: Secondary | ICD-10-CM | POA: Diagnosis not present

## 2019-08-19 DIAGNOSIS — I10 Essential (primary) hypertension: Secondary | ICD-10-CM | POA: Diagnosis not present

## 2019-08-19 DIAGNOSIS — E559 Vitamin D deficiency, unspecified: Secondary | ICD-10-CM

## 2019-08-19 DIAGNOSIS — D649 Anemia, unspecified: Secondary | ICD-10-CM

## 2019-08-19 MED ORDER — VITAMIN D (ERGOCALCIFEROL) 1.25 MG (50000 UNIT) PO CAPS: ORAL_CAPSULE | ORAL | 3 refills | Status: DC

## 2019-08-19 MED ORDER — LOSARTAN POTASSIUM 50 MG PO TABS: ORAL_TABLET | ORAL | 3 refills | Status: DC

## 2019-08-19 MED ORDER — ROSUVASTATIN CALCIUM 5 MG PO TABS: ORAL_TABLET | ORAL | 0 refills | Status: DC

## 2019-08-19 NOTE — Progress Notes (Signed)
Telehealth office visit note for Dawn Lawson, D.O- at Primary Care at [facility]   I connected with current patient today and verified that I am speaking with the correct person using two identifiers.   . Location of the patient: Home . Location of the provider: Office Only the patient (+/- their family members at pt's discretion) and myself were participating in the encounter - This visit type was conducted due to national recommendations for restrictions regarding the COVID-19 Pandemic (e.g. social distancing) in an effort to limit this patient's exposure and mitigate transmission in our community.  This format is felt to be most appropriate for this patient at this time.   - The patient did not have access to video technology or had technical difficulties with video requiring transitioning to audio format only. - No physical exam could be performed with this format, beyond that communicated to [place] by the patient/ family members as noted.   - Additionally my office staff/ schedulers discussed with the patient that there may be a monetary charge related to this service, depending on their medical insurance.   The patient expressed understanding, and agreed to proceed.       History of Present Illness:  I, Toni Amend, am serving as scribe for Dr. Mellody Lawson.   Patient says she's feeling pretty good this morning.  "I'm sleepin' in!"  - Keflex Use Daily Patient takes keflex twice daily for prevention of "infections in the back of her legs."  - History of Anemia Patient notes she takes iron in her daily multivitamin.  - Vitamin D Deficiency Per patient's son, she was taking Vitamin D at 50,000 units weekly, until this Rx ran out.  Currently taking Vitamin D OTC, 125 mcg.  HPI:  Hypertension:  Patient checks BP once per day now, "I did check 'em twice."  -  Her blood pressure at home has been running:  Per patient's son, her average BP is 158/53, pulse 48 on  average. 129/48 is the lowest.  Highest was 177, 180, "depends on when she takes it." Notes a couple BP around 132/52, "been a couple 120's, 125/48."   Patient's son says "she never started taking the two losartan; she was taking one and [her BP] got down that low, and she was kind of afraid to do the two at a time."  Confirms that she is taking one tablet of losartan daily.  Per patient, she tries to sit "for a bit" before taking her BP.  - Patient reports good compliance with medication and/or lifestyle modification  - Her denies acute concerns or problems related to treatment plan  - She denies new onset of: chest pain, exercise intolerance, shortness of breath, dizziness, visual changes, headache, lower extremity swelling or claudication.   Patient emphatically denies symptoms; "oh Lord no."  Last 3 blood pressure readings in our office are as follows: BP Readings from Last 3 Encounters:  08/19/19 (!) 149/52  07/21/19 (!) 168/68  07/16/19 (!) 166/48   Filed Weights   08/19/19 0902  Weight: 186 lb (84.4 kg)        No flowsheet data found.  Depression screen [facility] 2/9 07/21/2019 07/16/2019 10/31/2016 04/14/2014  Decreased Interest 0 0 0 0  Down, Depressed, Hopeless 0 0 0 0  PHQ - 2 Score 0 0 0 0  Altered sleeping 0 0 - -  Tired, decreased energy 0 0 - -  Change in appetite 0 0 - -  Feeling  bad or failure about yourself  0 0 - -  Trouble concentrating 0 0 - -  Moving slowly or fidgety/restless 0 0 - -  Suicidal thoughts 0 0 - -  PHQ-9 Score 0 0 - -  Difficult doing work/chores Not difficult at all - - -      Impression and Recommendations:    1. Hypertension, unspecified type   2. Prediabetes   3. Mixed hyperlipidemia   4. Vitamin D deficiency   5. Anemia, unspecified type   6. Hypertensive crisis       - Reviewed recent lab work (08/15/2019) in depth with patient today.  All lab work within normal limits unless otherwise noted.  Extensive education provided and  all questions answered.   Kidney Function - Discussed that with aging, kidney function will decrease. - Discussed that long history of HTN can also negatively affect kidney function. - Serum creatinine last measured at 1.05, electrolytes WNL. - Advised patient to avoid use of NSAID's/nephrotoxic substances. - Will continue to monitor. - reck 33mo w CMP obtianed   History of Anemia - Per patient and son, takes multivitamin with extra iron. - cbc stable - Continue supplementation as established. - Will continue to monitor and re-check as recommended.   Vitamin D Deficiency - Per patient son, patient formerly managed on 50k IU's weekly- pt ran out though couple wks ago. - Prescription refilled today.  See med list. - Resume once-weekly supplementation. - Will continue to monitor and re-check as recommended.   Prediabetes - A1c measured at 6.2 last check, down from 6.3 two years prior. - Recommended re-check in 6-12 months. - Counseled patient on prevention of diabetes and discussed dietary and lifestyle modification as first line.   - We will continue to monitor   Hypertension - h/o Hypertensive Crisis - BP stable at this time on current management.  Much improved with medication change from last OV - Extensively reviewed current treatment plan with patient today. - Discussed that with current average BP and heart rate, 158/53, pulse 48, as long as the patient is not feeling dizzy or symptomatic with positional changes, these values are acceptable.   - However, if patient becomes dizzy or lightheaded when moving from sitting to standing, patient knows to let us know immediately so that we may adjust medication dose. - Otherwise, continue treatment plan as prescribed. - Patient tolerating losartan well.  Denies concerns or S-E. - Ongoing ambulatory BP monitoring encouraged. - Will continue to monitor closely.   Mixed Hyperlipidemia - Triglycerides = 214, elevated. - HDL = 32,  low. - LDL = 116, elevated.  - Cholesterol levels are not at goal, we discussed with patient's pretty healthy status and might live several more years, we decided to begin this medicine after long discussion. - Discussed need to begin statin management to help reduce risk factors for premature death due to stroke or heart attack.  Patient agrees to begin low dose statin today. - Patient will take 0.5 tablets every M, W, F night as advised.  See med list. - In addition to prescription intervention, prudent dietary changes such as low saturated & trans fat diets for hyperlipidemia and low carb diets for hypertriglyceridemia discussed with patient.   - Encouraged patient to follow AHA guidelines for regular exercise as tolerated, and also engage in weight loss if BMI above 25.  - Told patient to look online at the [place] website for further information.  - We will continue to  monitor closely. - Re-check cholesterol and liver function in 2-3 months.   Cardiac Health - Last echocardiogram obtained in June of 2015, TEE. E WNL's - Education provided to patient today regarding historical test results and heart health in general. - Will continue to monitor   Recommendations - Return for lab only Vitamin D, CMP and FLP in 2-3 months after starting statin.  - As part of my medical decision making, I reviewed the following data within the [place] History obtained from pt /family, CMA notes reviewed and incorporated if applicable, Labs reviewed, Radiograph/ tests reviewed if applicable and OV notes from prior OV's with me, as well as other specialists she/he has seen since seeing me last, were all reviewed and used in my medical decision making process today.    - Additionally, discussion had with patient regarding our treatment plan, and their biases/concerns about that plan were used in my medical decision making today.    - The patient agreed with the plan and  demonstrated an understanding of the instructions.   No barriers to understanding were identified.    - Red flag symptoms and signs discussed in detail.  Patient expressed understanding regarding what to do in case of emergency\ urgent symptoms.   - The patient was advised to call back or seek an in-person evaluation if the symptoms worsen or if the condition fails to improve as anticipated.   Return for  lab only FBW, Vit D, CMP, FLP in 2mo or soafter starting statin, OV 3-5d later.    Orders Placed This Encounter  Procedures  . Comprehensive metabolic panel  . Lipid panel  . VITAMIN D 25 Hydroxy (Vit-D Deficiency, Fractures)    Meds ordered this encounter  Medications  . losartan (COZAAR) 50 MG tablet    Sig: 1 po qd    Dispense:  90 tablet    Refill:  3  . rosuvastatin (CRESTOR) 5 MG tablet    Sig: 0.5 tab q hs every mon, wed and fri night    Dispense:  30 tablet    Refill:  0  . Vitamin D, Ergocalciferol, (DRISDOL) 1.25 MG (50000 UT) CAPS capsule    Sig: Take one tablet wkly    Dispense:  12 capsule    Refill:  3    Medications Discontinued During This Encounter  Medication Reason  . losartan (COZAAR) 50 MG tablet      I provided 27 minutes of non face-to-face time during this encounter.  Additional time was spent with charting and coordination of care after the actual visit commenced.   Note:  This note was prepared with assistance of Dragon voice recognition software. Occasional wrong-word or sound-a-like substitutions may have occurred due to the inherent limitations of voice recognition software.  This document serves as a record of services personally performed by Dawn Dance, DO. It was created on her behalf by Toni Amend, a trained medical scribe. The creation of this record is based on the scribe's personal observations and the provider's statements to them.   This case required medical decision making of at least moderate complexity. The above  documentation has been reviewed to be accurate and was completed by Marjory Sneddon, D.O.     Patient Care Team    Relationship Specialty Notifications Start End  Dawn Dance, DO PCP - General Family Medicine  07/16/19   Milus Height, MD Referring Physician Ophthalmology  10/14/14   Rod Can, MD Consulting Physician Orthopedic Surgery  07/16/19      -  Vitals obtained; medications/ allergies reconciled;  personal medical, social, Sx etc.histories were updated by CMA, reviewed by me and are reflected in chart   Patient Active Problem List   Diagnosis Date Noted  . Prediabetes 08/19/2019    Priority: High  . 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    Priority: High  . Hypertensive crisis 07/16/2019    Priority: High  . HTN (hypertension) 10/14/2014    Priority: High  . Class 3 severe obesity with serious comorbidity in adult [facility]) 07/16/2019    Priority: Medium  . 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, nodules and fibrosis 07/16/2019    Priority: Medium  . 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 07/16/2019    Priority: Medium  . Family history of colon cancer in mother- died age 89; patient refuses colonoscopy 07/16/2019    Priority: Medium  . Anemia 10/14/2014    Priority: Medium  . h/o Osteomyelitis ([place])- R leg-  07/16/2019    Priority: Low  . Colonoscopy refused 07/16/2019    Priority: Low  . Vitamin D deficiency 10/14/2014    Priority: Low  . Mixed hyperlipidemia 08/19/2019  . Vaccine refused by patient- flu and others 07/16/2019  . Medically noncompliant 07/16/2019  . Hypertensive disorder 12/04/2017  . Abscess of right thigh 02/22/2017  . Macular degeneration 10/14/2014  . Arthritis 10/14/2014    Current Meds  Medication Sig  . acetaminophen (TYLENOL) 500 MG tablet Take 500-1,000 mg by mouth every 6 (six) hours as needed for moderate pain.  Marland Kitchen aspirin EC 81 MG tablet Take 81 mg by mouth 2 (two) times daily.   .  bisoprolol-hydrochlorothiazide (ZIAC) 5-6.25 MG tablet Take 1 tablet by mouth daily.  . cephALEXin (KEFLEX) 500 MG capsule Take 1 capsule (500 mg total) by mouth 2 (two) times daily.  Marland Kitchen Dextran 70-Hypromellose (ARTIFICIAL TEARS PF OP) Place 1 drop into both eyes as needed (for dry eyes).  Marland Kitchen losartan (COZAAR) 50 MG tablet 1 po qd  . Multiple Vitamins-Minerals (MULTIVITAMIN PO) Take 1 tablet by mouth daily.  . Multiple Vitamins-Minerals (PRESERVISION AREDS PO) Take 1 capsule by mouth 2 (two) times daily.  . [DISCONTINUED] losartan (COZAAR) 50 MG tablet 1/2 tab po qd for 8 d, then 1 po qd 8 d, and inc to 2 po qd only if BP higher than 140/90 consistently    Allergies:  Allergies  Allergen Reactions  . Penicillins Hives and Itching    Tolerated Ancef 11/17 and cefepime 02/2017  Has patient had a PCN reaction causing immediate rash, facial/tongue/throat swelling, SOB or lightheadedness with hypotension: Yes Has patient had a PCN reaction causing severe rash involving mucus membranes or skin necrosis: No Has patient had a PCN reaction that required hospitalization: No Has patient had a PCN reaction occurring within the last 10 years:  # # YES # #  If all of the above answers are "NO", then may proceed with Cephalosporin use.  . Chocolate Other (See Comments)    Migraines   . Tape Itching  . Ampicillin Hives and Itching    ROS:  See above HPI for pertinent positives and negatives   Objective:   Blood pressure (!) 149/52, height 5' (1.524 m), weight 186 lb (84.4 kg).  (if some vitals are omitted, this means that patient was UNABLE to obtain them even though they were asked to get them prior to OV today.  They were asked to call us at their earliest convenience with these once obtained.) General: A & O * 3;  sounds in no acute distress; in usual state of health.  Skin: Pt confirms warm and dry extremities and pink fingertips HEENT: Pt confirms lips non-cyanotic Chest: Patient confirms  normal chest excursion and movement Respiratory: speaking in full sentences, no conversational dyspnea; patient confirms no use of accessory muscles Psych: insight appears good, mood- appears full

## 2019-10-15 ENCOUNTER — Other Ambulatory Visit: Payer: Self-pay

## 2019-10-15 ENCOUNTER — Other Ambulatory Visit: Payer: Medicare Other

## 2019-10-15 DIAGNOSIS — E782 Mixed hyperlipidemia: Secondary | ICD-10-CM

## 2019-10-15 DIAGNOSIS — E559 Vitamin D deficiency, unspecified: Secondary | ICD-10-CM

## 2019-10-16 LAB — COMPREHENSIVE METABOLIC PANEL
ALT: 12 IU/L (ref 0–32)
AST: 15 IU/L (ref 0–40)
Albumin/Globulin Ratio: 1.5 (ref 1.2–2.2)
Albumin: 4.1 g/dL (ref 3.6–4.6)
Alkaline Phosphatase: 71 IU/L (ref 39–117)
BUN/Creatinine Ratio: 25 (ref 12–28)
BUN: 24 mg/dL (ref 8–27)
Bilirubin Total: 0.5 mg/dL (ref 0.0–1.2)
CO2: 22 mmol/L (ref 20–29)
Calcium: 9.3 mg/dL (ref 8.7–10.3)
Chloride: 102 mmol/L (ref 96–106)
Creatinine, Ser: 0.96 mg/dL (ref 0.57–1.00)
GFR calc Af Amer: 61 mL/min/{1.73_m2} (ref >59)
GFR calc non Af Amer: 53 mL/min/{1.73_m2} — ABNORMAL LOW (ref >59)
Globulin, Total: 2.8 g/dL (ref 1.5–4.5)
Glucose: 121 mg/dL — ABNORMAL HIGH (ref 65–99)
Potassium: 4.7 mmol/L (ref 3.5–5.2)
Sodium: 140 mmol/L (ref 134–144)
Total Protein: 6.9 g/dL (ref 6.0–8.5)

## 2019-10-16 LAB — LIPID PANEL
Chol/HDL Ratio: 4.4 ratio (ref 0.0–4.4)
Cholesterol, Total: 161 mg/dL (ref 100–199)
HDL: 37 mg/dL — ABNORMAL LOW (ref >39)
LDL Chol Calc (NIH): 85 mg/dL (ref 0–99)
Triglycerides: 232 mg/dL — ABNORMAL HIGH (ref 0–149)
VLDL Cholesterol Cal: 39 mg/dL (ref 5–40)

## 2019-10-16 LAB — VITAMIN D 25 HYDROXY (VIT D DEFICIENCY, FRACTURES): Vit D, 25-Hydroxy: 36.4 ng/mL (ref 30.0–100.0)

## 2019-10-20 ENCOUNTER — Ambulatory Visit (INDEPENDENT_AMBULATORY_CARE_PROVIDER_SITE_OTHER): Payer: Medicare Other | Admitting: Family Medicine

## 2019-10-20 ENCOUNTER — Other Ambulatory Visit: Payer: Self-pay

## 2019-10-20 ENCOUNTER — Encounter: Payer: Self-pay | Admitting: Family Medicine

## 2019-10-20 VITALS — BP 161/63 | HR 65

## 2019-10-20 DIAGNOSIS — I10 Essential (primary) hypertension: Secondary | ICD-10-CM

## 2019-10-20 DIAGNOSIS — E559 Vitamin D deficiency, unspecified: Secondary | ICD-10-CM | POA: Diagnosis not present

## 2019-10-20 DIAGNOSIS — R7303 Prediabetes: Secondary | ICD-10-CM

## 2019-10-20 DIAGNOSIS — E785 Hyperlipidemia, unspecified: Secondary | ICD-10-CM | POA: Diagnosis not present

## 2019-10-20 DIAGNOSIS — I119 Hypertensive heart disease without heart failure: Secondary | ICD-10-CM | POA: Diagnosis not present

## 2019-10-20 MED ORDER — VITAMIN D (ERGOCALCIFEROL) 1.25 MG (50000 UNIT) PO CAPS: ORAL_CAPSULE | ORAL | 3 refills | Status: DC

## 2019-10-20 MED ORDER — ROSUVASTATIN CALCIUM 5 MG PO TABS: ORAL_TABLET | ORAL | 1 refills | Status: DC

## 2019-10-20 MED ORDER — BISOPROLOL-HYDROCHLOROTHIAZIDE 5-6.25 MG PO TABS: 1.0000 | ORAL_TABLET | Freq: Every day | ORAL | 0 refills | Status: DC

## 2019-10-20 NOTE — Progress Notes (Signed)
Telehealth office visit note for Mellody Dance, D.O- at Primary Care at [facility]   I connected with current patient today and verified that I am speaking with the correct person using two identifiers.   . Location of the patient: Home . Location of the provider: Office Only the patient (+/- their family members at pt's discretion) and myself were participating in the encounter - This visit type was conducted due to national recommendations for restrictions regarding the COVID-19 Pandemic (e.g. social distancing) in an effort to limit this patient's exposure and mitigate transmission in our community.  This format is felt to be most appropriate for this patient at this time.   - The patient did not have access to video technology or had technical difficulties with video requiring transitioning to audio format only. - No physical exam could be performed with this format, beyond that communicated to [place] by the patient/ family members as noted.   - Additionally my office staff/ schedulers discussed with the patient that there may be a monetary charge related to this service, depending on their medical insurance.   The patient expressed understanding, and agreed to proceed.       History of Present Illness:    I, Toni Amend, am serving as scribe for Dr. Mellody Dance.  Notes her holiday was fine.  States she's been "feeling good and sort of energetic."  Says she doesn't worry about much, either.  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  Why worry about it if you can't do anything about it?"  HPI:  Hyperlipidemia:  85 y.o. female here for cholesterol follow-up.   - Patient reports good compliance with treatment plan of:  medication and/ or lifestyle management.  Taking her pill M,W,F, but notes it's hard to swallow.  Says it "seems it melts as it goes down."  Notes she takes all her medicine like she's supposed to.  - Patient denies any acute concerns or problems with management  plan.  She is experiencing no side effects on the Crestor.  - She denies new onset of: myalgias, arthralgias, increased fatigue more than normal, chest pains, exercise intolerance, shortness of breath, dizziness, visual changes, headache, lower extremity swelling or claudication.   Most recent cholesterol panel was:  Lab Results  Component Value Date   CHOL 161 10/15/2019   HDL 37 (L) 10/15/2019   LDLCALC 85 10/15/2019   TRIG 232 (H) 10/15/2019   CHOLHDL 4.4 10/15/2019   Hepatic Function Latest Ref Rng & Units 10/15/2019 08/15/2019 02/22/2017  Total Protein 6.0 - 8.5 g/dL 6.9 6.9 7.5  Albumin 3.6 - 4.6 g/dL 4.1 4.2 2.8(L)  AST 0 - 40 IU/L '15 18 20  ' ALT 0 - 32 IU/L 12 13 11(L)  Alk Phosphatase 39 - 117 IU/L 71 73 67  Total Bilirubin 0.0 - 1.2 mg/dL 0.5 0.3 0.6   HPI:  Hypertension:  -  Her blood pressure at home has been running: 145/50 on average.  - Patient reports good compliance with medication and/or lifestyle modification  - Her denies acute concerns or problems related to treatment plan  - She denies new onset of: chest pain, exercise intolerance, shortness of breath, dizziness, visual changes, headache, lower extremity swelling or claudication.   Last 3 blood pressure readings in our office are as follows: BP Readings from Last 3 Encounters:  10/20/19 (!) 161/63  08/19/19 (!) 149/52  07/21/19 (!) 168/68     Depression screen PHQ 2/9 10/20/2019 07/21/2019  07/16/2019 10/31/2016 04/14/2014  Decreased Interest 0 0 0 0 0  Down, Depressed, Hopeless 0 0 0 0 0  PHQ - 2 Score 0 0 0 0 0  Altered sleeping 0 0 0 - -  Tired, decreased energy 0 0 0 - -  Change in appetite 0 0 0 - -  Feeling bad or failure about yourself  0 0 0 - -  Trouble concentrating 0 0 0 - -  Moving slowly or fidgety/restless 0 0 0 - -  Suicidal thoughts 0 0 0 - -  PHQ-9 Score 0 0 0 - -  Difficult doing work/chores - Not difficult at all - - -      Impression and Recommendations:    1. Dyslipidemia  (high LDL; low HDL)   2. Hypertensive cardiomegaly   3. Hypertension, unspecified type   4. Vitamin D deficiency   5. Prediabetes      - Last OV August 19, 2019.  - Reviewed recent lab work (10/15/2019) in depth with patient today.  All lab work within normal limits unless otherwise noted.  Extensive education provided and all questions answered.  Prediabetes - A1c last checked August 15, 2019, 6.2. - Will re-check next OV as discussed. - Patient knows to continue to avoid sugar and high-carb foods.  Liver Enzymes - Re-checked after beginning statin management.  - AST = 15, down from 18 prior. - ALT = 12, down from 13 prior.  - Will continue to monitor as discussed.  Kidney Function - Serum creatinine improved to 0.96 from 1.05 prior. - GFR improved to 53 from 48 prior.  - Encouraged patient to hydrate adequately, avoiding caffeine and sugared drinks.  - Will continue to monitor and re-check as discussed.  Hypertension - Hypertensive Cardiomegaly - Stable at this time, BP at goal. - Continue treatment plan as prescribed. - Refills provided today.  - Counseled patient on pathophysiology of disease and discussed various treatment options, which always includes dietary and lifestyle modification as first line.   - Lifestyle changes such as dash and heart healthy diets and engaging in a regular exercise program discussed extensively with patient.   - Ongoing ambulatory blood pressure monitoring encouraged at least 3 times weekly.  Keep log and bring in every office visit.  Reminded patient that if they ever feel poorly in any way, to check their blood pressure and pulse.  - Handouts provided at patient's desire and/or told to go online at the [place] website for further information  - We will continue to monitor  Vitamin D Deficiency - 36.4 last check. - Reviewed goal range of 40-60.  - Discussed beginning prescription Vitamin D or increasing OTC  management. - Begin prescription Vitamin D today.  See med list. - Patient will take one tablet Wednesday, and one tablet Sunday.  - Will continue to monitor and re-check as discussed.  Dyslipidemia - High LDL, Low HDL - Increase dose to full tablet of Crestor, every M,W,F night.  - Triglycerides = 232, elevated, up from 214 prior. - HDL = 37, low, but up from 32 prior. - LDL = 85, down from 116 prior.  - Patient tolerating Crestor well.  Denies S-E. - Both LDL and HDL have improved while on statin management.  - Prudent dietary changes such as low saturated & trans fat diets for hyperlipidemia and low carb diets for hypertriglyceridemia discussed with patient.    Encouraged patient to follow AHA guidelines for regular exercise and also engage  in weight loss if BMI above 25.   Educational handouts provided at patient's desire and/ or told to look online at the [employer] website for further information.  We will continue to monitor  Recommendations - Check A1c, Vit D, FLP, CMP prior to next OV.    - As part of my medical decision making, I reviewed the following data within the [facility] History obtained from pt /family, CMA notes reviewed and incorporated if applicable, Labs reviewed, Radiograph/ tests reviewed if applicable and OV notes from prior OV's with me, as well as other specialists she/he has seen since seeing me last, were all reviewed and used in my medical decision making process today.    - Additionally, discussion had with patient regarding our treatment plan, and their biases/concerns about that plan were used in my medical decision making today.    - The patient agreed with the plan and demonstrated an understanding of the instructions.   No barriers to understanding were identified.    - Red flag symptoms and signs discussed in detail.  Patient expressed understanding regarding what to do in case of emergency\ urgent symptoms.     - The patient was advised to call back or seek an in-person evaluation if the symptoms worsen or if the condition fails to improve as anticipated.   Return for re-check in 2.5 months fasting labs- Vit D, A1c, FLP, CMP, with OV one week later.    Meds ordered this encounter  Medications  . rosuvastatin (CRESTOR) 5 MG tablet    Sig: 1 tab q hs every mon, wed and fri night    Dispense:  36 tablet    Refill:  1  . Vitamin D, Ergocalciferol, (DRISDOL) 1.25 MG (50000 UT) CAPS capsule    Sig: Take one tablet wed and 1 tab Sun    Dispense:  24 capsule    Refill:  3  . bisoprolol-hydrochlorothiazide (ZIAC) 5-6.25 MG tablet    Sig: Take 1 tablet by mouth daily.    Dispense:  90 tablet    Refill:  0    Medications Discontinued During This Encounter  Medication Reason  . rosuvastatin (CRESTOR) 5 MG tablet   . bisoprolol-hydrochlorothiazide (ZIAC) 5-6.25 MG tablet Reorder  . Vitamin D, Ergocalciferol, (DRISDOL) 1.25 MG (50000 UT) CAPS capsule Reorder     I provided 20+ minutes of non face-to-face time during this encounter.  Additional time was spent with charting and coordination of care before and after the actual visit commenced.   Note:  This note was prepared with assistance of Dragon voice recognition software. Occasional wrong-word or sound-a-like substitutions may have occurred due to the inherent limitations of voice recognition software.  This document serves as a record of services personally performed by Mellody Dance, DO. It was created on her behalf by Toni Amend, a trained medical scribe. The creation of this record is based on the scribe's personal observations and the provider's statements to them.   This case required medical decision making of at least moderate complexity. The above documentation has been reviewed to be accurate and was completed by Marjory Sneddon, D.O.       Patient Care Team    Relationship Specialty Notifications Start End   Mellody Dance, DO PCP - General Family Medicine  07/16/19   Milus Height, MD Referring Physician Ophthalmology  10/14/14   Rod Can, MD Consulting Physician Orthopedic Surgery  07/16/19      -Vitals obtained; medications/ allergies  reconciled;  personal medical, social, Sx etc.histories were updated by CMA, reviewed by me and are reflected in chart   Patient Active Problem List   Diagnosis Date Noted  . Prediabetes 08/19/2019  . 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 Hypertensive crisis 07/16/2019  . HTN (hypertension) 10/14/2014  . Class 3 severe obesity with serious comorbidity in adult [facility]) 07/16/2019  . 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, nodules and fibrosis 07/16/2019  . 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 07/16/2019  . Family history of colon cancer in mother- died age 64; patient refuses colonoscopy 07/16/2019  . Anemia 10/14/2014  . h/o Osteomyelitis ([place])- R leg-  07/16/2019  . Colonoscopy refused 07/16/2019  . Vitamin D deficiency 10/14/2014  . Mixed hyperlipidemia 08/19/2019  . Vaccine refused by patient- flu and others 07/16/2019  . Medically noncompliant 07/16/2019  . Hypertensive disorder 12/04/2017  . Abscess of right thigh 02/22/2017  . Macular degeneration 10/14/2014  . Arthritis 10/14/2014     Current Meds  Medication Sig  . acetaminophen (TYLENOL) 500 MG tablet Take 500-1,000 mg by mouth every 6 (six) hours as needed for moderate pain.  Marland Kitchen aspirin EC 81 MG tablet Take 81 mg by mouth 2 (two) times daily.   . bisoprolol-hydrochlorothiazide (ZIAC) 5-6.25 MG tablet Take 1 tablet by mouth daily.  . cephALEXin (KEFLEX) 500 MG capsule Take 1 capsule (500 mg total) by mouth 2 (two) times daily.  Marland Kitchen Dextran 70-Hypromellose (ARTIFICIAL TEARS PF OP) Place 1 drop into both eyes as needed (for dry eyes).  Marland Kitchen losartan (COZAAR) 50 MG tablet 1 po qd  . Multiple Vitamins-Minerals (MULTIVITAMIN PO) Take 1 tablet by mouth daily.  .  Multiple Vitamins-Minerals (PRESERVISION AREDS PO) Take 1 capsule by mouth 2 (two) times daily.  . rosuvastatin (CRESTOR) 5 MG tablet 1 tab q hs every mon, wed and fri night  . Vitamin D, Ergocalciferol, (DRISDOL) 1.25 MG (50000 UT) CAPS capsule Take one tablet wed and 1 tab Sun  . [DISCONTINUED] bisoprolol-hydrochlorothiazide (ZIAC) 5-6.25 MG tablet Take 1 tablet by mouth daily.  . [DISCONTINUED] rosuvastatin (CRESTOR) 5 MG tablet 0.5 tab q hs every mon, wed and fri night  . [DISCONTINUED] Vitamin D, Ergocalciferol, (DRISDOL) 1.25 MG (50000 UT) CAPS capsule Take one tablet wkly     Allergies:  Allergies  Allergen Reactions  . Penicillins Hives and Itching    Tolerated Ancef 11/17 and cefepime 02/2017  Has patient had a PCN reaction causing immediate rash, facial/tongue/throat swelling, SOB or lightheadedness with hypotension: Yes Has patient had a PCN reaction causing severe rash involving mucus membranes or skin necrosis: No Has patient had a PCN reaction that required hospitalization: No Has patient had a PCN reaction occurring within the last 10 years:  # # YES # #  If all of the above answers are "NO", then may proceed with Cephalosporin use.  . Chocolate Other (See Comments)    Migraines   . Tape Itching  . Ampicillin Hives and Itching     ROS:  See above HPI for pertinent positives and negatives   Objective:   Blood pressure (!) 161/63, pulse 65.  (if some vitals are omitted, this means that patient was UNABLE to obtain them even though they were asked to get them prior to OV today.  They were asked to call us at their earliest convenience with these once obtained. )  General: A & O * 3; sounds in no acute distress; in usual state of health.  Skin: Pt confirms  warm and dry extremities and pink fingertips HEENT: Pt confirms lips non-cyanotic Chest: Patient confirms normal chest excursion and movement Respiratory: speaking in full sentences, no conversational dyspnea;  patient confirms no use of accessory muscles Psych: insight appears good, mood- appears full

## 2019-12-29 ENCOUNTER — Other Ambulatory Visit: Payer: Medicare Other

## 2019-12-29 ENCOUNTER — Other Ambulatory Visit: Payer: Self-pay

## 2019-12-29 DIAGNOSIS — E782 Mixed hyperlipidemia: Secondary | ICD-10-CM

## 2019-12-29 DIAGNOSIS — D649 Anemia, unspecified: Secondary | ICD-10-CM

## 2019-12-29 DIAGNOSIS — E559 Vitamin D deficiency, unspecified: Secondary | ICD-10-CM

## 2019-12-29 DIAGNOSIS — R7303 Prediabetes: Secondary | ICD-10-CM

## 2019-12-29 DIAGNOSIS — I10 Essential (primary) hypertension: Secondary | ICD-10-CM

## 2019-12-29 NOTE — Progress Notes (Signed)
Return for re-check in 2.5 months fasting labs- Vit D, A1c, FLP, CMP, with OV one week later.     Ordering VIt D, A1C, Lipid and CMP from note on 10/20/19. AS, CMA

## 2019-12-30 LAB — COMPREHENSIVE METABOLIC PANEL
ALT: 13 IU/L (ref 0–32)
AST: 13 IU/L (ref 0–40)
Albumin/Globulin Ratio: 1.6 (ref 1.2–2.2)
Albumin: 4.3 g/dL (ref 3.6–4.6)
Alkaline Phosphatase: 68 IU/L (ref 39–117)
BUN/Creatinine Ratio: 24 (ref 12–28)
BUN: 25 mg/dL (ref 8–27)
Bilirubin Total: 0.4 mg/dL (ref 0.0–1.2)
CO2: 21 mmol/L (ref 20–29)
Calcium: 9.1 mg/dL (ref 8.7–10.3)
Chloride: 102 mmol/L (ref 96–106)
Creatinine, Ser: 1.06 mg/dL — ABNORMAL HIGH (ref 0.57–1.00)
GFR calc Af Amer: 54 mL/min/{1.73_m2} — ABNORMAL LOW (ref >59)
GFR calc non Af Amer: 47 mL/min/{1.73_m2} — ABNORMAL LOW (ref >59)
Globulin, Total: 2.7 g/dL (ref 1.5–4.5)
Glucose: 115 mg/dL — ABNORMAL HIGH (ref 65–99)
Potassium: 4.4 mmol/L (ref 3.5–5.2)
Sodium: 140 mmol/L (ref 134–144)
Total Protein: 7 g/dL (ref 6.0–8.5)

## 2019-12-30 LAB — HEMOGLOBIN A1C
Est. average glucose Bld gHb Est-mCnc: 128 mg/dL
Hgb A1c MFr Bld: 6.1 % — ABNORMAL HIGH (ref 4.8–5.6)

## 2019-12-30 LAB — LIPID PANEL
Chol/HDL Ratio: 3.9 ratio (ref 0.0–4.4)
Cholesterol, Total: 145 mg/dL (ref 100–199)
HDL: 37 mg/dL — ABNORMAL LOW (ref >39)
LDL Chol Calc (NIH): 79 mg/dL (ref 0–99)
Triglycerides: 167 mg/dL — ABNORMAL HIGH (ref 0–149)
VLDL Cholesterol Cal: 29 mg/dL (ref 5–40)

## 2019-12-30 LAB — VITAMIN D 25 HYDROXY (VIT D DEFICIENCY, FRACTURES): Vit D, 25-Hydroxy: 59.7 ng/mL (ref 30.0–100.0)

## 2020-01-05 ENCOUNTER — Encounter: Payer: Self-pay | Admitting: Family Medicine

## 2020-01-05 ENCOUNTER — Ambulatory Visit (INDEPENDENT_AMBULATORY_CARE_PROVIDER_SITE_OTHER): Payer: Medicare Other | Admitting: Family Medicine

## 2020-01-05 ENCOUNTER — Other Ambulatory Visit: Payer: Self-pay

## 2020-01-05 VITALS — BP 136/45 | Ht 61.0 in | Wt 190.0 lb

## 2020-01-05 DIAGNOSIS — R7303 Prediabetes: Secondary | ICD-10-CM | POA: Diagnosis not present

## 2020-01-05 DIAGNOSIS — E782 Mixed hyperlipidemia: Secondary | ICD-10-CM | POA: Diagnosis not present

## 2020-01-05 DIAGNOSIS — I119 Hypertensive heart disease without heart failure: Secondary | ICD-10-CM

## 2020-01-05 DIAGNOSIS — E559 Vitamin D deficiency, unspecified: Secondary | ICD-10-CM | POA: Diagnosis not present

## 2020-01-05 MED ORDER — LOSARTAN POTASSIUM 50 MG PO TABS: ORAL_TABLET | ORAL | 0 refills | Status: DC

## 2020-01-05 MED ORDER — VITAMIN D (ERGOCALCIFEROL) 1.25 MG (50000 UNIT) PO CAPS: ORAL_CAPSULE | ORAL | 3 refills | Status: DC

## 2020-01-05 NOTE — Progress Notes (Signed)
Telehealth office visit note for Dawn Lawson, D.O- at Primary Care at [facility]   I connected with current patient today and verified that I am speaking with the correct person    Location of the patient: Home  Location of the provider: Office - This visit type was conducted due to national recommendations for restrictions regarding the COVID-19 Pandemic (e.g. social distancing) in an effort to limit this patient's exposure and mitigate transmission in our community.    - No physical exam could be performed with this format, beyond that communicated to [place] by the patient/ family members as noted.   - Additionally my office staff/ schedulers were to discuss with the patient that there may be a monetary charge related to this service, depending on their medical insurance.  My understanding is that patient understood and consented to proceed.     _________________________________________________________________________________   History of Present Illness:  I, Toni Amend, am serving as Education administrator for [employer].  Notes she's doing "pretty good" today.  She does not have concerns today; "I'm very happy where I am."  - Hydration Son thinks that the patient doesn't drink a lot of water per day.  Patient states she drink a cup of coffee every morning, then has a "drink," then has about two glasses of tea with supper.  Says "I drink another drink after supper (son says "decaf"), and then I drink one before I go to bed."  Says "I try to get in enough fluids, and of course the milk on the cereal counts."  - Physical Activity Notes for physical activity, she walks in the house.  Says she has a rod in her leg, and has a hard time getting up.  Notes "someone has to help me up and down the steps."  HPI:  Hyperlipidemia:  86 y.o. female here for cholesterol follow-up.   - Patient reports good compliance with treatment plan of:  medication and/ or lifestyle management.  Last OV,  increased dose of Crestor to one full tablet every M,W,F from half-tablet every M,W,F prior.  Says she's doing well with this, taking her medication regularly and religiously.  - Patient denies any acute concerns or problems with management plan.  Denies concerns with muscle aches or cramps.  - She denies new onset of: myalgias, arthralgias, increased fatigue more than normal, chest pains, exercise intolerance, shortness of breath, dizziness, visual changes, headache, lower extremity swelling or claudication.   Most recent cholesterol panel was:  Lab Results  Component Value Date   CHOL 145 12/29/2019   HDL 37 (L) 12/29/2019   LDLCALC 79 12/29/2019   TRIG 167 (H) 12/29/2019   CHOLHDL 3.9 12/29/2019   Hepatic Function Latest Ref Rng & Units 12/29/2019 10/15/2019 08/15/2019  Total Protein 6.0 - 8.5 g/dL 7.0 6.9 6.9  Albumin 3.6 - 4.6 g/dL 4.3 4.1 4.2  AST 0 - 40 IU/L '13 15 18  ' ALT 0 - 32 IU/L '13 12 13  ' Alk Phosphatase 39 - 117 IU/L 68 71 73  Total Bilirubin 0.0 - 1.2 mg/dL 0.4 0.5 0.3   HPI:  Hypertension:  -  Her blood pressure at home has been running: 137/50 on average, stable.  Son notes "It's been staying about that."  Son says 145 is about the highest value he sees.  - Patient reports good compliance with medication and/or lifestyle modification  - Her denies acute concerns or problems related to treatment plan  - She denies new  onset of: chest pain, exercise intolerance, shortness of breath, dizziness, visual changes, headache, lower extremity swelling or claudication.   Last 3 blood pressure readings in our office are as follows: BP Readings from Last 3 Encounters:  01/05/20 (!) 136/45  10/20/19 (!) 161/63  08/19/19 (!) 149/52   Filed Weights   01/05/20 1051  Weight: 190 lb (86.2 kg)     No flowsheet data found.  Depression screen [facility] 2/9 01/05/2020 10/20/2019 07/21/2019 07/16/2019 10/31/2016  Decreased Interest 0 0 0 0 0  Down, Depressed, Hopeless 0 0 0 0 0  PHQ -  2 Score 0 0 0 0 0  Altered sleeping 0 0 0 0 -  Tired, decreased energy 0 0 0 0 -  Change in appetite 0 0 0 0 -  Feeling bad or failure about yourself  0 0 0 0 -  Trouble concentrating 0 0 0 0 -  Moving slowly or fidgety/restless 0 0 0 0 -  Suicidal thoughts 0 0 0 0 -  PHQ-9 Score 0 0 0 0 -  Difficult doing work/chores - - Not difficult at all - -      Impression and Recommendations:     1. Hypertensive cardiomegaly   2. Mixed hyperlipidemia   3. Vitamin D deficiency   4. Prediabetes     - Reviewed recent lab work (12/29/2019) in depth with patient today.  All lab work within normal limits unless otherwise noted.  Dehydration - Reviewed that patient's recent lab work shows evidence of dehydration. - Patient's kidney function is stable.  - To help preserve the health of the kidneys, encouraged patient to drink sugar-free, non-caffeinated beverages, or plain water to adequately hydrate herself.  - Encouraged patient to aim for a total of 70 ounces of water per day.  - Will continue to monitor.  Prediabetes - A1c 6.1 last check, improved from 6.2 prior. - Discussed that patient's status is stable at present. - Will continue to re-check twice yearly.  Vitamin D Deficiency - 59.7, WNL last check. - Continue management as established to maintain in 40-60 range. - Will continue to monitor and re-check as dicussed.  Hypertensive Cardiomegaly - Blood pressure currently is stable, at goal. - Patient will continue current treatment regimen.  See med list.  - Counseled patient on pathophysiology of disease and discussed various treatment options, which always includes dietary and lifestyle modification as first line.   - Lifestyle changes such as engaging in a regular exercise program discussed extensively with patient.  Encouraged patient to increase her physical activity as tolerated.  - Ambulatory blood pressure monitoring encouraged at least 3 times weekly.  Keep log and  bring in every office visit.  Reminded patient that if they ever feel poorly in any way, to check their blood pressure and pulse.  - We will continue to monitor.  Mixed Hyperlipidemia - Last OV, increased dose of Crestor to one full tablet every M,W,F up from half-tablet every M,W,F prior.  - Last FLP obtained 7 days ago: Triglycerides = 167, elevated down from 232 prior. HDL = 37, low, stable from 37 prior. LDL = 79, down from 85 prior, and 116 prior to that.  - FLP improved and liver enzymes WNL last check.  - Pt will continue current treatment regimen, one full tablet of Crestor every M,W,F.  See med list.  - We will continue to monitor.     - As part of my medical decision making, I reviewed the following data  within the [place] History obtained from pt /family, CMA notes reviewed and incorporated if applicable, Labs reviewed, Radiograph/ tests reviewed if applicable and OV notes from prior OV's with me, as well as other specialists she/he has seen since seeing me last, were all reviewed and used in my medical decision making process today.    - Additionally, when appropriate, discussion had with patient regarding our treatment plan, and their biases/concerns about that plan were used in my medical decision making today.    - The patient agreed with the plan and demonstrated an understanding of the instructions.   No barriers to understanding were identified.     - The patient was advised to call back or seek an in-person evaluation if the symptoms worsen or if the condition fails to improve as anticipated.   Return for f/up in 4-6 months, with in-person visit next OV .    No orders of the defined types were placed in this encounter.   Meds ordered this encounter  Medications   Vitamin D, Ergocalciferol, (DRISDOL) 1.25 MG (50000 UNIT) CAPS capsule    Sig: Take one tablet wed and 1 tab Sun    Dispense:  24 capsule    Refill:  3   losartan (COZAAR) 50  MG tablet    Sig: 1 po qd    Dispense:  90 tablet    Refill:  0    Medications Discontinued During This Encounter  Medication Reason   losartan (COZAAR) 50 MG tablet Reorder   Vitamin D, Ergocalciferol, (DRISDOL) 1.25 MG (50000 UT) CAPS capsule Reorder       Time spent on visit including pre-visit chart review and post-visit care was 12 minutes.    Note:  This note was prepared with assistance of Dragon voice recognition software. Occasional wrong-word or sound-a-like substitutions may have occurred due to the inherent limitations of voice recognition software.  The [place] was signed into law in 2016 which includes the topic of electronic health records.  This provides immediate access to information in MyChart.  This includes consultation notes, operative notes, office notes, lab results and pathology reports.  If you have any questions about what you read please let us know at your next visit or call us at the office.  We are right here with you.   This document serves as a record of services personally performed by Dawn Dance, DO. It was created on her behalf by Toni Amend, a trained medical scribe. The creation of this record is based on the scribe's personal observations and the provider's statements to them.   This case required medical decision making of at least moderate complexity. The above documentation from Toni Amend, medical scribe, has been reviewed by Marjory Sneddon, D.O.     __________________________________________________________________________________     Patient Care Team    Relationship Specialty Notifications Start End  Dawn Dance, DO PCP - General Family Medicine  07/16/19   Milus Height, MD Referring Physician Ophthalmology  10/14/14   Rod Can, MD Consulting Physician Orthopedic Surgery  07/16/19      -Vitals obtained; medications/ allergies reconciled;  personal medical, social, Sx  etc.histories were updated by CMA, reviewed by me and are reflected in chart   Patient Active Problem List   Diagnosis Date Noted   Prediabetes 08/19/2019   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   Hypertensive crisis 07/16/2019   HTN (hypertension) 10/14/2014   Class 3 severe obesity with serious comorbidity in adult [facility])  07/16/2019   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 07/16/2019   Family history of colon cancer in mother- died age 48; patient refuses colonoscopy 07/16/2019   Anemia 10/14/2014   h/o Osteomyelitis ([place])- R leg-  07/16/2019   Colonoscopy refused 07/16/2019   Vitamin D deficiency 10/14/2014   Mixed hyperlipidemia 08/19/2019   Vaccine refused by patient- flu and others 07/16/2019   Medically noncompliant 07/16/2019   Hypertensive disorder 12/04/2017   Abscess of right thigh 02/22/2017   Macular degeneration 10/14/2014   Arthritis 10/14/2014     Current Meds  Medication Sig   acetaminophen (TYLENOL) 500 MG tablet Take 500-1,000 mg by mouth every 6 (six) hours as needed for moderate pain.   aspirin EC 81 MG tablet Take 81 mg by mouth 2 (two) times daily.    bisoprolol-hydrochlorothiazide (ZIAC) 5-6.25 MG tablet Take 1 tablet by mouth daily.   cephALEXin (KEFLEX) 500 MG capsule Take 1 capsule (500 mg total) by mouth 2 (two) times daily.   Dextran 70-Hypromellose (ARTIFICIAL TEARS PF OP) Place 1 drop into both eyes as needed (for dry eyes).   losartan (COZAAR) 50 MG tablet 1 po qd   Multiple Vitamins-Minerals (MULTIVITAMIN PO) Take 1 tablet by mouth daily.   Multiple Vitamins-Minerals (PRESERVISION AREDS PO) Take 1 capsule by mouth 2 (two) times daily.   rosuvastatin (CRESTOR) 5 MG tablet 1 tab q hs every mon, wed and fri night   Vitamin D, Ergocalciferol, (DRISDOL) 1.25 MG (50000 UNIT) CAPS capsule Take one tablet wed and 1 tab Sun    [DISCONTINUED] losartan (COZAAR) 50 MG tablet 1 po qd   [DISCONTINUED] Vitamin D, Ergocalciferol, (DRISDOL) 1.25 MG (50000 UT) CAPS capsule Take one tablet wed and 1 tab Sun     Allergies:  Allergies  Allergen Reactions   Penicillins Hives and Itching    Tolerated Ancef 11/17 and cefepime 02/2017  Has patient had a PCN reaction causing immediate rash, facial/tongue/throat swelling, SOB or lightheadedness with hypotension: Yes Has patient had a PCN reaction causing severe rash involving mucus membranes or skin necrosis: No Has patient had a PCN reaction that required hospitalization: No Has patient had a PCN reaction occurring within the last 10 years:  # # YES # #  If all of the above answers are "NO", then may proceed with Cephalosporin use.   Chocolate Other (See Comments)    Migraines    Tape Itching   Ampicillin Hives and Itching     ROS:  See above HPI for pertinent positives and negatives   Objective:   Blood pressure (!) 136/45, height '5\' 1"'  (1.549 m), weight 190 lb (86.2 kg).  (if some vitals are omitted, this means that patient was UNABLE to obtain them even though they were asked to get them prior to OV today.  They were asked to call us at their earliest convenience with these once obtained. ) General: A & O * 3; sounds in no acute distress; in usual state of health.  Skin: Pt confirms warm and dry extremities and pink fingertips HEENT: Pt confirms lips non-cyanotic Chest: Patient confirms normal chest excursion and movement Respiratory: speaking in full sentences, no conversational dyspnea; patient confirms no use of accessory muscles Psych: insight appears good, mood- appears full

## 2020-01-31 ENCOUNTER — Other Ambulatory Visit: Payer: Self-pay | Admitting: Family Medicine

## 2020-01-31 DIAGNOSIS — I119 Hypertensive heart disease without heart failure: Secondary | ICD-10-CM

## 2020-04-10 ENCOUNTER — Other Ambulatory Visit: Payer: Self-pay | Admitting: Physician Assistant

## 2020-04-10 DIAGNOSIS — I119 Hypertensive heart disease without heart failure: Secondary | ICD-10-CM

## 2020-05-06 ENCOUNTER — Other Ambulatory Visit: Payer: Self-pay | Admitting: Physician Assistant

## 2020-05-06 DIAGNOSIS — I119 Hypertensive heart disease without heart failure: Secondary | ICD-10-CM

## 2020-05-08 ENCOUNTER — Other Ambulatory Visit: Payer: Self-pay | Admitting: Physician Assistant

## 2020-05-08 DIAGNOSIS — E785 Hyperlipidemia, unspecified: Secondary | ICD-10-CM

## 2020-05-13 ENCOUNTER — Other Ambulatory Visit: Payer: Self-pay | Admitting: Physician Assistant

## 2020-05-13 DIAGNOSIS — I119 Hypertensive heart disease without heart failure: Secondary | ICD-10-CM

## 2020-05-13 DIAGNOSIS — E785 Hyperlipidemia, unspecified: Secondary | ICD-10-CM

## 2020-05-13 MED ORDER — ROSUVASTATIN CALCIUM 5 MG PO TABS: ORAL_TABLET | ORAL | 0 refills | Status: DC

## 2020-05-13 MED ORDER — BISOPROLOL-HYDROCHLOROTHIAZIDE 5-6.25 MG PO TABS: 1.0000 | ORAL_TABLET | Freq: Every day | ORAL | 0 refills | Status: DC

## 2020-05-31 ENCOUNTER — Other Ambulatory Visit: Payer: Self-pay

## 2020-05-31 ENCOUNTER — Ambulatory Visit (INDEPENDENT_AMBULATORY_CARE_PROVIDER_SITE_OTHER): Payer: Medicare Other | Admitting: Physician Assistant

## 2020-05-31 ENCOUNTER — Encounter: Payer: Self-pay | Admitting: Physician Assistant

## 2020-05-31 VITALS — BP 171/66 | HR 52 | Temp 98.2°F | Ht 61.0 in

## 2020-05-31 DIAGNOSIS — E559 Vitamin D deficiency, unspecified: Secondary | ICD-10-CM | POA: Diagnosis not present

## 2020-05-31 DIAGNOSIS — Z79899 Other long term (current) drug therapy: Secondary | ICD-10-CM

## 2020-05-31 DIAGNOSIS — R7303 Prediabetes: Secondary | ICD-10-CM | POA: Diagnosis not present

## 2020-05-31 DIAGNOSIS — E782 Mixed hyperlipidemia: Secondary | ICD-10-CM

## 2020-05-31 DIAGNOSIS — I10 Essential (primary) hypertension: Secondary | ICD-10-CM

## 2020-05-31 NOTE — Patient Instructions (Signed)
Prediabetes Prediabetes is the condition of having a blood sugar (blood glucose) level that is higher than it should be, but not high enough for you to be diagnosed with type 2 diabetes. Having prediabetes puts you at risk for developing type 2 diabetes (type 2 diabetes mellitus). Prediabetes may be called impaired glucose tolerance or impaired fasting glucose. Prediabetes usually does not cause symptoms. Your health care provider can diagnose this condition with blood tests. You may be tested for prediabetes if you are overweight and if you have at least one other risk factor for prediabetes. What is blood glucose, and how is it measured? Blood glucose refers to the amount of glucose in your bloodstream. Glucose comes from eating foods that contain sugars and starches (carbohydrates), which the body breaks down into glucose. Your blood glucose level may be measured in mg/dL (milligrams per deciliter) or mmol/L (millimoles per liter). Your blood glucose may be checked with one or more of the following blood tests:  A fasting blood glucose (FBG) test. You will not be allowed to eat (you will fast) for 8 hours or longer before a blood sample is taken. ? A normal range for FBG is 70-100 mg/dl (3.9-5.6 mmol/L).  An A1c (hemoglobin A1c) blood test. This test provides information about blood glucose control over the previous 2?3months.  An oral glucose tolerance test (OGTT). This test measures your blood glucose at two times: ? After fasting. This is your baseline level. ? Two hours after you drink a beverage that contains glucose. You may be diagnosed with prediabetes:  If your FBG is 100?125 mg/dL (5.6-6.9 mmol/L).  If your A1c level is 5.7?6.4%.  If your OGTT result is 140?199 mg/dL (7.8-11 mmol/L). These blood tests may be repeated to confirm your diagnosis. How can this condition affect me? The pancreas produces a hormone (insulin) that helps to move glucose from the bloodstream into cells.  When cells in the body do not respond properly to insulin that the body makes (insulin resistance), excess glucose builds up in the blood instead of going into cells. As a result, high blood glucose (hyperglycemia) can develop, which can cause many complications. Hyperglycemia is a symptom of prediabetes. Having high blood glucose for a long time is dangerous. Too much glucose in your blood can damage your nerves and blood vessels. Long-term damage can lead to complications from diabetes, which may include:  Heart disease.  Stroke.  Blindness.  Kidney disease.  Depression.  Poor circulation in the feet and legs, which could lead to surgical removal (amputation) in severe cases. What can increase my risk? Risk factors for prediabetes include:  Having a family member with type 2 diabetes.  Being overweight or obese.  Being older than age 45.  Being of American Indian, African-American, Hispanic/Latino, or Asian/Pacific Islander descent.  Having an inactive (sedentary) lifestyle.  Having a history of heart disease.  History of gestational diabetes or polycystic ovary syndrome (PCOS), in women.  Having low levels of good cholesterol (HDL-C) or high levels of blood fats (triglycerides).  Having high blood pressure. What actions can I take to prevent diabetes?      Be physically active. ? Do moderate-intensity physical activity for 30 or more minutes on 5 or more days of the week, or as much as told by your health care provider. This could be brisk walking, biking, or water aerobics. ? Ask your health care provider what activities are safe for you. A mix of physical activities may be best, such as   walking, swimming, cycling, and strength training.  Lose weight as told by your health care provider. ? Losing 5-7% of your body weight can reverse insulin resistance. ? Your health care provider can determine how much weight loss is best for you and can help you lose weight  safely.  Follow a healthy meal plan. This includes eating lean proteins, complex carbohydrates, fresh fruits and vegetables, low-fat dairy products, and healthy fats. ? Follow instructions from your health care provider about eating or drinking restrictions. ? Make an appointment to see a diet and nutrition specialist (registered dietitian) to help you create a healthy eating plan that is right for you.  Do not smoke or use any tobacco products, such as cigarettes, chewing tobacco, and e-cigarettes. If you need help quitting, ask your health care provider.  Take over-the-counter and prescription medicines as told by your health care provider. You may be prescribed medicines that help lower the risk of type 2 diabetes.  Keep all follow-up visits as told by your health care provider. This is important. Summary  Prediabetes is the condition of having a blood sugar (blood glucose) level that is higher than it should be, but not high enough for you to be diagnosed with type 2 diabetes.  Having prediabetes puts you at risk for developing type 2 diabetes (type 2 diabetes mellitus).  To help prevent type 2 diabetes, make lifestyle changes such as being physically active and eating a healthy diet. Lose weight as told by your health care provider. This information is not intended to replace advice given to you by your health care provider. Make sure you discuss any questions you have with your health care provider. Document Revised: 01/31/2019 Document Reviewed: 11/30/2015 [employer]vier Patient Education  2020 [employer]  

## 2020-05-31 NOTE — Progress Notes (Signed)
Established Patient Office Visit  Subjective:  Patient ID: Dawn Lawson, female    DOB: 29-Jul-1929  Age: 86 y.o. MRN: [id number]  CC:  Chief Complaint  Patient presents with   Hyperlipidemia   Hypertension   Pre-DM   vitamin d deficiency    HPI Dawn Lawson presents for follow-up on hypertension, hyperlipidemia, prediabetes and vitamin d deficiency. Pt reports she is doing well and has no acute concerns today. She is accompanied by her son today.  HTN: Pt denies new onset chest pain, palpitations, dizziness or lower extremity swelling. Taking medication as directed without side effects. Checks BP at home k and readings range in 115/45-50.   HLD: At last OV Crestor was increased from 0.5 tablet of 5 mg to full tablet. Pt taking medication as directed without issues. Denies side effects including myalgias and RUQ pain.   Prediabetes: Denies increased thirst or urination.  Vit D deficiency: Reports medication compliance.  Past Medical History:  Diagnosis Date   AKI (acute kidney injury) ([place]) 03/22/2014   Anemia    Arthritis    ATN (acute tubular necrosis) ([place]) 03/22/2014   Cataract    Heart murmur    Hypertension    Lactic acidosis 03/22/2014   PONV (postoperative nausea and vomiting)    Pre-diabetes    Sepsis due to group B Streptococcus ([place]) 04/14/2014   Severe sepsis with acute organ dysfunction ([place]) 03/22/2014   Status post PICC central line placement 04/14/2014   Urinary tract infection 03/22/2014    Past Surgical History:  Procedure Laterality Date   ABDOMINAL HYSTERECTOMY     partial   APPENDECTOMY     APPLICATION OF WOUND VAC Right 09/18/2016   Procedure: APPLICATION OF WOUND VAC;  Surgeon: Rod Can, MD;  Location: [place];  Service: Orthopedics;  Laterality: Right;   EXCISIONAL TOTAL KNEE ARTHROPLASTY WITH ANTIBIOTIC SPACERS Right 09/18/2016   Procedure: RESECTION OF RIGHT TOTAL KNEE ARTHROPLASTY PLACEMENT OF ANTIBIOTIC SPACER;   Surgeon: Rod Can, MD;  Location: [place];  Service: Orthopedics;  Laterality: Right;   EYE SURGERY     JOINT REPLACEMENT Right    KNEE   KNEE HARDWARE REMOVAL Right 11/24/2016   RECTOCELE REPAIR     TEE WITHOUT CARDIOVERSION N/A 04/09/2014   Procedure: TRANSESOPHAGEAL ECHOCARDIOGRAM (TEE);  Surgeon: Lelon Perla, MD;  Location: [facility] ENDOSCOPY;  Service: Cardiovascular;  Laterality: N/A;   TOTAL KNEE ARTHROPLASTY WITH REVISION COMPONENTS Right 11/24/2016   Procedure: Removal of right knee spacer, Right knee arthrodesis;  Surgeon: Rod Can, MD;  Location: [place];  Service: Orthopedics;  Laterality: Right;    Family History  Problem Relation Age of Onset   Heart disease Mother    Cancer Mother        colon   Heart attack Father     Social History   Socioeconomic History   Marital status: Widowed    Spouse name: n/a   Number of children: 3   Years of education: Not on file   Highest education level: Not on file  Occupational History   Occupation: retired Biomedical scientist  Tobacco Use   Smoking status: Former Smoker    Packs/day: 1.00    Years: 2.00    Pack years: 2.00    Types: Cigarettes    Quit date: 10/23/1976    Years since quitting: 43.6   Smokeless tobacco: Never Used  Vaping Use   Vaping Use: Never used  Substance and Sexual Activity   Alcohol  use: No    Alcohol/week: 0.0 standard drinks   Drug use: No   Sexual activity: Not Currently    Birth control/protection: None  Other Topics Concern   Not on file  Social History Narrative   Lives alone ("with the ghosts and the spiders.").   Her son lives nearby and helps provide care.   One daughter lives in [place], the other in [place].   Social Determinants of Health   Financial Resource Strain:    Difficulty of Paying Living Expenses:   Food Insecurity:    Worried About Charity fundraiser in the Last Year:    Arboriculturist in the Last Year:   Transportation Needs:    Consulting civil engineer (Medical):    Lack of Transportation (Non-Medical):   Physical Activity:    Days of Exercise per Week:    Minutes of Exercise per Session:   Stress:    Feeling of Stress :   Social Connections:    Frequency of Communication with Friends and Family:    Frequency of Social Gatherings with Friends and Family:    Attends Religious Services:    Active Member of Clubs or Organizations:    Attends Music therapist:    Marital Status:   Intimate Partner Violence:    Fear of Current or Ex-Partner:    Emotionally Abused:    Physically Abused:    Sexually Abused:     Outpatient Medications Prior to Visit  Medication Sig Dispense Refill   acetaminophen (TYLENOL) 500 MG tablet Take 500-1,000 mg by mouth every 6 (six) hours as needed for moderate pain.     aspirin EC 81 MG tablet Take 81 mg by mouth 2 (two) times daily.      bisoprolol-hydrochlorothiazide (ZIAC) 5-6.25 MG tablet Take 1 tablet by mouth daily. 90 tablet 0   cephALEXin (KEFLEX) 500 MG capsule Take 1 capsule (500 mg total) by mouth 2 (two) times daily. 60 capsule 11   Dextran 70-Hypromellose (ARTIFICIAL TEARS PF OP) Place 1 drop into both eyes as needed (for dry eyes).     losartan (COZAAR) 50 MG tablet TAKE 1 TABLET BY MOUTH DAILY 90 tablet 0   Multiple Vitamins-Minerals (MULTIVITAMIN PO) Take 1 tablet by mouth daily.     Multiple Vitamins-Minerals (PRESERVISION AREDS PO) Take 1 capsule by mouth 2 (two) times daily.     rosuvastatin (CRESTOR) 5 MG tablet 1 tab q hs every mon, wed and fri night 36 tablet 0   Vitamin D, Ergocalciferol, (DRISDOL) 1.25 MG (50000 UNIT) CAPS capsule Take one tablet wed and 1 tab Sun 24 capsule 3   No facility-administered medications prior to visit.    Allergies  Allergen Reactions   Penicillins Hives and Itching    Tolerated Ancef 11/17 and cefepime 02/2017  Has patient had a PCN reaction causing immediate rash, facial/tongue/throat swelling,  SOB or lightheadedness with hypotension: Yes Has patient had a PCN reaction causing severe rash involving mucus membranes or skin necrosis: No Has patient had a PCN reaction that required hospitalization: No Has patient had a PCN reaction occurring within the last 10 years:  # # YES # #  If all of the above answers are "NO", then may proceed with Cephalosporin use.   Chocolate Other (See Comments)    Migraines    Tape Itching   Ampicillin Hives and Itching    ROS Review of Systems  A fourteen system review of systems was performed and found  to be positive as per HPI.  Objective:    Physical Exam General:  Well nourished, appropriate for stated age, in no acute distress. Neuro:  Alert and oriented,  extra-ocular muscles intact  HEENT:  Normocephalic, atraumatic, neck supple Skin:  no gross rash, warm, pink. Cardiac:  RRR, S1 S2 +murmur Respiratory:  ECTA B/L and A/P, Not using accessory muscles, speaking in full sentences- unlabored. Vascular:  Ext warm, no cyanosis apprec.; cap RF less 2 sec. +edema (chronic) Psych:  No HI/SI, judgement and insight good, Euthymic mood. Full Affect.   BP (!) 171/66    Pulse (!) 52    Temp 98.2 F (36.8 C) (Oral)    Ht '5\' 1"'  (1.549 m)    SpO2 98% Comment: on RA   BMI 35.90 kg/m  Wt Readings from Last 3 Encounters:  01/05/20 190 lb (86.2 kg)  08/19/19 186 lb (84.4 kg)  07/21/19 186 lb (84.4 kg)     Health Maintenance Due  Topic Date Due   COVID-19 Vaccine (1) Never done   TETANUS/TDAP  Never done   DEXA SCAN  Never done   PNA vac Low Risk Adult (1 of 2 - PCV13) Never done   INFLUENZA VACCINE  05/23/2020    There are no preventive care reminders to display for this patient.  Lab Results  Component Value Date   TSH 2.180 08/15/2019   Lab Results  Component Value Date   WBC 6.7 08/15/2019   HGB 11.5 08/15/2019   HCT 34.3 08/15/2019   MCV 89 08/15/2019   PLT 213 08/15/2019   Lab Results  Component Value Date   NA 140  12/29/2019   K 4.4 12/29/2019   CO2 21 12/29/2019   GLUCOSE 115 (H) 12/29/2019   BUN 25 12/29/2019   CREATININE 1.06 (H) 12/29/2019   BILITOT 0.4 12/29/2019   ALKPHOS 68 12/29/2019   AST 13 12/29/2019   ALT 13 12/29/2019   PROT 7.0 12/29/2019   ALBUMIN 4.3 12/29/2019   CALCIUM 9.1 12/29/2019   ANIONGAP 15 07/16/2019   Lab Results  Component Value Date   CHOL 145 12/29/2019   Lab Results  Component Value Date   HDL 37 (L) 12/29/2019   Lab Results  Component Value Date   LDLCALC 79 12/29/2019   Lab Results  Component Value Date   TRIG 167 (H) 12/29/2019   Lab Results  Component Value Date   CHOLHDL 3.9 12/29/2019   Lab Results  Component Value Date   HGBA1C 6.1 (H) 12/29/2019      Assessment & Plan:   Problem List Items Addressed This Visit      Cardiovascular and Mediastinum   HTN (hypertension) - Primary (Chronic)    -BP elevated today, repeat BP -Continue current medication regimen. -Follow DASH diet and stay well hydrated.      Relevant Orders   Comp Met (CMET)     Other   Vitamin D deficiency    -Last Vit D 59.7 -Recheck Vit D today. -Continue with Vit D supplement twice weekly.      Relevant Orders   Vitamin D (25 hydroxy)   Prediabetes    -Last A1c stable -Follow low carbohydrate and glucose diet. -Will continue to monitor and recheck next OV.      Mixed hyperlipidemia    - Last lipid panel: triglycerides elevated, HDL low - Continue Crestor 5 mg on MWF - Follow heart healthy diet. - Rechecking lipid panel and hepatic function today.  Relevant Orders   Lipid Profile    Other Visit Diagnoses    On statin therapy       Relevant Orders   Comp Met (CMET)      No orders of the defined types were placed in this encounter.   Follow-up: Return in about 4 months (around 09/30/2020) for [place] and [place].    Lorrene Reid, PA-C

## 2020-05-31 NOTE — Assessment & Plan Note (Signed)
-  Last Vit D 59.7 -Recheck Vit D today. -Continue with Vit D supplement twice weekly.

## 2020-05-31 NOTE — Assessment & Plan Note (Addendum)
-  BP elevated today, repeat BP elevated. -Per chart review, BP can fluctuate during office visits. Advised to continue ambulatory BP and pulse monitoring, notify me if BP consistently >140/90 and will make medication adjustments. -Continue current medication regimen. -Follow DASH diet and stay well hydrated.

## 2020-05-31 NOTE — Assessment & Plan Note (Addendum)
-   Last lipid panel: triglycerides elevated, HDL low - Continue Crestor 5 mg on MWF - Follow heart healthy diet. - Rechecking lipid panel and hepatic function today.

## 2020-05-31 NOTE — Assessment & Plan Note (Signed)
-  Last A1c stable -Follow low carbohydrate and glucose diet. -Will continue to monitor and recheck next OV.

## 2020-06-01 LAB — COMPREHENSIVE METABOLIC PANEL
ALT: 15 IU/L (ref 0–32)
AST: 17 IU/L (ref 0–40)
Albumin/Globulin Ratio: 1.5 (ref 1.2–2.2)
Albumin: 4.3 g/dL (ref 3.6–4.6)
Alkaline Phosphatase: 70 IU/L (ref 48–121)
BUN/Creatinine Ratio: 29 — ABNORMAL HIGH (ref 12–28)
BUN: 27 mg/dL (ref 8–27)
Bilirubin Total: 0.5 mg/dL (ref 0.0–1.2)
CO2: 22 mmol/L (ref 20–29)
Calcium: 9.3 mg/dL (ref 8.7–10.3)
Chloride: 102 mmol/L (ref 96–106)
Creatinine, Ser: 0.94 mg/dL (ref 0.57–1.00)
GFR calc Af Amer: 63 mL/min/{1.73_m2} (ref >59)
GFR calc non Af Amer: 54 mL/min/{1.73_m2} — ABNORMAL LOW (ref >59)
Globulin, Total: 2.8 g/dL (ref 1.5–4.5)
Glucose: 114 mg/dL — ABNORMAL HIGH (ref 65–99)
Potassium: 4.5 mmol/L (ref 3.5–5.2)
Sodium: 140 mmol/L (ref 134–144)
Total Protein: 7.1 g/dL (ref 6.0–8.5)

## 2020-06-01 LAB — LIPID PANEL
Chol/HDL Ratio: 4.1 ratio (ref 0.0–4.4)
Cholesterol, Total: 161 mg/dL (ref 100–199)
HDL: 39 mg/dL — ABNORMAL LOW (ref >39)
LDL Chol Calc (NIH): 88 mg/dL (ref 0–99)
Triglycerides: 202 mg/dL — ABNORMAL HIGH (ref 0–149)
VLDL Cholesterol Cal: 34 mg/dL (ref 5–40)

## 2020-06-01 LAB — VITAMIN D 25 HYDROXY (VIT D DEFICIENCY, FRACTURES): Vit D, 25-Hydroxy: 51.7 ng/mL (ref 30.0–100.0)

## 2020-06-17 ENCOUNTER — Other Ambulatory Visit: Payer: Self-pay

## 2020-06-17 ENCOUNTER — Emergency Department (HOSPITAL_COMMUNITY)
Admission: EM | Admit: 2020-06-17 | Discharge: 2020-06-18 | Disposition: A | Discharge status: Home / Self Care | Payer: Medicare Other | Attending: Emergency Medicine | Admitting: Emergency Medicine

## 2020-06-17 ENCOUNTER — Encounter (HOSPITAL_COMMUNITY): Payer: Self-pay | Admitting: *Deleted

## 2020-06-17 DIAGNOSIS — R040 Epistaxis: Secondary | ICD-10-CM | POA: Insufficient documentation

## 2020-06-17 DIAGNOSIS — Z87891 Personal history of nicotine dependence: Secondary | ICD-10-CM | POA: Insufficient documentation

## 2020-06-17 DIAGNOSIS — R7303 Prediabetes: Secondary | ICD-10-CM | POA: Diagnosis not present

## 2020-06-17 DIAGNOSIS — Z7982 Long term (current) use of aspirin: Secondary | ICD-10-CM | POA: Diagnosis not present

## 2020-06-17 DIAGNOSIS — I10 Essential (primary) hypertension: Secondary | ICD-10-CM | POA: Diagnosis not present

## 2020-06-17 DIAGNOSIS — Z96651 Presence of right artificial knee joint: Secondary | ICD-10-CM | POA: Diagnosis not present

## 2020-06-17 DIAGNOSIS — Z79899 Other long term (current) drug therapy: Secondary | ICD-10-CM | POA: Diagnosis not present

## 2020-06-17 DIAGNOSIS — D649 Anemia, unspecified: Secondary | ICD-10-CM | POA: Diagnosis not present

## 2020-06-17 LAB — BASIC METABOLIC PANEL
Anion gap: 12 (ref 5–15)
BUN: 27 mg/dL — ABNORMAL HIGH (ref 8–23)
CO2: 24 mmol/L (ref 22–32)
Calcium: 9.4 mg/dL (ref 8.9–10.3)
Chloride: 102 mmol/L (ref 98–111)
Creatinine, Ser: 0.99 mg/dL (ref 0.44–1.00)
GFR calc Af Amer: 59 mL/min — ABNORMAL LOW (ref >60)
GFR calc non Af Amer: 51 mL/min — ABNORMAL LOW (ref >60)
Glucose, Bld: 125 mg/dL — ABNORMAL HIGH (ref 70–99)
Potassium: 4.8 mmol/L (ref 3.5–5.1)
Sodium: 138 mmol/L (ref 135–145)

## 2020-06-17 LAB — CBC
HCT: 37.5 % (ref 36.0–46.0)
Hemoglobin: 11.6 g/dL — ABNORMAL LOW (ref 12.0–15.0)
MCH: 29.4 pg (ref 26.0–34.0)
MCHC: 30.9 g/dL (ref 30.0–36.0)
MCV: 94.9 fL (ref 80.0–100.0)
Platelets: 208 10*3/uL (ref 150–400)
RBC: 3.95 MIL/uL (ref 3.87–5.11)
RDW: 12.8 % (ref 11.5–15.5)
WBC: 9 10*3/uL (ref 4.0–10.5)
nRBC: 0 % (ref 0.0–0.2)

## 2020-06-17 NOTE — ED Triage Notes (Signed)
Pt states that the bleeding began without any trauma, she feels that she may have lost a pint of blood.  Pt is alert and oriented. Bleeding has now resolved.  Pt has been taking her BP medication but BP is high in triage.

## 2020-06-17 NOTE — ED Triage Notes (Signed)
Pt is not on any blood thinners but she had a nosebleed today.  This is now resolved.  Hypertensive for ems.

## 2020-06-17 NOTE — ED Notes (Signed)
Cleaned dried blood. Pt began bleeding again. Placed gauze in left nare which was dripping blood. New mask given

## 2020-06-18 MED ORDER — TRANEXAMIC ACID FOR EPISTAXIS
500.0000 mg | Freq: Once | TOPICAL | Status: AC
  Administered 2020-06-18: 500 mg via TOPICAL
  Filled 2020-06-18: qty 10

## 2020-06-18 NOTE — ED Notes (Signed)
Pt explained that she had a nose bleed that would not stop.  Bleeding has since stopped.  Pt takes 81ASA 2X daily

## 2020-06-18 NOTE — Discharge Instructions (Signed)
If your nose starts bleeding again, and you are not able to control it at home, then see the ENT doctor or return to the Emergency Department.  Please monitor your blood pressure at home. If it stays significantly elevated, your primary care provider may need to adjust your blood pressure medication.

## 2020-06-18 NOTE — ED Provider Notes (Signed)
[facility] EMERGENCY DEPARTMENT Provider Note   CSN: [id number] Arrival date & time: 06/17/20  1826   History Chief Complaint  Patient presents with  . Epistaxis    Dawn Lawson is a 86 y.o. female.  The history is provided by the patient.  Epistaxis She has history of hypertension, hyperlipidemia, prediabetes and comes in because of nosebleed.  Her nose was bleeding from the left nostril.  It stopped and started several times, but she was unable to control it at home.  She denies any trauma.  She is not on any anticoagulants.  She is on low-dose aspirin but no other antiplatelet agents.  She has had nosebleeds in the past, but not recently.  Past Medical History:  Diagnosis Date  . AKI (acute kidney injury) ([place]) 03/22/2014  . Anemia   . Arthritis   . ATN (acute tubular necrosis) ([place]) 03/22/2014  . Cataract   . Heart murmur   . Hypertension   . Lactic acidosis 03/22/2014  . PONV (postoperative nausea and vomiting)   . Pre-diabetes   . Sepsis due to group B Streptococcus ([place]) 04/14/2014  . Severe sepsis with acute organ dysfunction ([place]) 03/22/2014  . Status post PICC central line placement 04/14/2014  . Urinary tract infection 03/22/2014    Patient Active Problem List   Diagnosis Date Noted  . Prediabetes 08/19/2019  . Mixed hyperlipidemia 08/19/2019  . 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 h/o Osteomyelitis ([place])- R leg-  07/16/2019  . Class 3 severe obesity with serious comorbidity in adult [facility]) 07/16/2019  . 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, nodules and fibrosis 07/16/2019  . 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 07/16/2019  . Family history of colon cancer in mother- died age 58; patient refuses colonoscopy 07/16/2019  . Colonoscopy refused 07/16/2019  . Vaccine refused by patient- flu and others 07/16/2019  . Medically noncompliant 07/16/2019  . Hypertensive crisis 07/16/2019  . Hypertensive disorder 12/04/2017  .  Abscess of right thigh 02/22/2017  . Macular degeneration 10/14/2014  . Vitamin D deficiency 10/14/2014  . HTN (hypertension) 10/14/2014  . Anemia 10/14/2014  . Arthritis 10/14/2014    Past Surgical History:  Procedure Laterality Date  . ABDOMINAL HYSTERECTOMY     partial  . APPENDECTOMY    . APPLICATION OF WOUND VAC Right 09/18/2016   Procedure: APPLICATION OF WOUND VAC;  Surgeon: Rod Can, MD;  Location: [place];  Service: Orthopedics;  Laterality: Right;  . EXCISIONAL TOTAL KNEE ARTHROPLASTY WITH ANTIBIOTIC SPACERS Right 09/18/2016   Procedure: RESECTION OF RIGHT TOTAL KNEE ARTHROPLASTY PLACEMENT OF ANTIBIOTIC SPACER;  Surgeon: Rod Can, MD;  Location: [place];  Service: Orthopedics;  Laterality: Right;  . EYE SURGERY    . JOINT REPLACEMENT Right    KNEE  . KNEE HARDWARE REMOVAL Right 11/24/2016  . RECTOCELE REPAIR    . TEE WITHOUT CARDIOVERSION N/A 04/09/2014   Procedure: TRANSESOPHAGEAL ECHOCARDIOGRAM (TEE);  Surgeon: Lelon Perla, MD;  Location: [facility] ENDOSCOPY;  Service: Cardiovascular;  Laterality: N/A;  . TOTAL KNEE ARTHROPLASTY WITH REVISION COMPONENTS Right 11/24/2016   Procedure: Removal of right knee spacer, Right knee arthrodesis;  Surgeon: Rod Can, MD;  Location: [place];  Service: Orthopedics;  Laterality: Right;     OB History   No obstetric history on file.     Family History  Problem Relation Age of Onset  . Heart disease Mother   . Cancer Mother        colon  . Heart  attack Father     Social History   Tobacco Use  . Smoking status: Former Smoker    Packs/day: 1.00    Years: 2.00    Pack years: 2.00    Types: Cigarettes    Quit date: 10/23/1976    Years since quitting: 43.6  . Smokeless tobacco: Never Used  Vaping Use  . Vaping Use: Never used  Substance Use Topics  . Alcohol use: No    Alcohol/week: 0.0 standard drinks  . Drug use: No    Home Medications Prior to Admission medications   Medication Sig Start Date End Date  Taking? Authorizing Provider  acetaminophen (TYLENOL) 500 MG tablet Take 500-1,000 mg by mouth every 6 (six) hours as needed for moderate pain.    [provider]  aspirin EC 81 MG tablet Take 81 mg by mouth 2 (two) times daily.     [provider]  bisoprolol-hydrochlorothiazide (ZIAC) 5-6.25 MG tablet Take 1 tablet by mouth daily. 05/13/20   Lorrene Reid, PA-C  cephALEXin (KEFLEX) 500 MG capsule Take 1 capsule (500 mg total) by mouth 2 (two) times daily. 11/27/16   Swinteck, Aaron Edelman, MD  Dextran 70-Hypromellose (ARTIFICIAL TEARS PF OP) Place 1 drop into both eyes as needed (for dry eyes).    [provider]  losartan (COZAAR) 50 MG tablet TAKE 1 TABLET BY MOUTH DAILY 04/12/20   Lorrene Reid, PA-C  Multiple Vitamins-Minerals (MULTIVITAMIN PO) Take 1 tablet by mouth daily.    [provider]  Multiple Vitamins-Minerals (PRESERVISION AREDS PO) Take 1 capsule by mouth 2 (two) times daily.    [provider]  rosuvastatin (CRESTOR) 5 MG tablet 1 tab q hs every mon, wed and fri night 05/13/20   Lorrene Reid, PA-C  Vitamin D, Ergocalciferol, (DRISDOL) 1.25 MG (50000 UNIT) CAPS capsule Take one tablet wed and 1 tab Sun 01/05/20   Mellody Dance, DO    Allergies    Penicillins, Chocolate, Tape, and Ampicillin  Review of Systems   Review of Systems  HENT: Positive for nosebleeds.   All other systems reviewed and are negative.   Physical Exam Updated Vital Signs BP (!) 206/64   Pulse 78   Temp 99.2 F (37.3 C) (Oral)   Resp 16   Ht 5\' 1"  (1.549 m)   Wt 81.6 kg   SpO2 98%   BMI 34.01 kg/m   Physical Exam Vitals and nursing note reviewed.   86 year old female, resting comfortably and in no acute distress. Vital signs are significant for elevated blood pressure. Oxygen saturation is 98%, which is normal. Head is normocephalic and atraumatic. PERRLA, EOMI. Oropharynx is clear.  No active bleeding is seen from the left nostril, but site of  recent bleeding is noted on the left side of the nasal septum and Kiesselbach's plexus. Neck is nontender and supple without adenopathy or JVD. Back is nontender and there is no CVA tenderness. Lungs are clear without rales, wheezes, or rhonchi. Chest is nontender. Heart has regular rate and rhythm without murmur. Abdomen is soft, flat, nontender without masses or hepatosplenomegaly and peristalsis is normoactive. Extremities have 1+ edema, full range of motion is present. Skin is warm and dry without rash. Neurologic: Mental status is normal, cranial nerves are intact, there are no motor or sensory deficits.  ED Results / Procedures / Treatments   Labs (all labs ordered are listed, but only abnormal results are displayed) Labs Reviewed  CBC - Abnormal; Notable for the following components:  Result Value   Hemoglobin 11.6 (*)    All other components within normal limits  BASIC METABOLIC PANEL - Abnormal; Notable for the following components:   Glucose, Bld 125 (*)    BUN 27 (*)    GFR calc non Af Amer 51 (*)    GFR calc Af Amer 59 (*)    All other components within normal limits    EKG None  Radiology No results found.  Procedures .Epistaxis Management  Date/Time: 06/18/2020 6:12 AM Performed by: Delora Fuel, MD Authorized by: Delora Fuel, MD   Consent:    Consent obtained:  Verbal   Consent given by:  Patient   Risks discussed:  Bleeding and pain   Alternatives discussed:  Alternative treatment Anesthesia (see MAR for exact dosages):    Anesthesia method:  None Procedure details:    Treatment site:  L anterior   Treatment method:  Anterior pack (Cotton soaked with tranexamic acid)   Treatment complexity:  Limited   Treatment episode: initial   Post-procedure details:    Assessment:  Bleeding stopped   Patient tolerance of procedure:  Tolerated well, no immediate complications     Medications Ordered in ED Medications - No data to display  ED Course    I have reviewed the triage vital signs and the nursing notes.  Pertinent lab results that were available during my care of the patient were reviewed by me and considered in my medical decision making (see chart for details).  MDM Rules/Calculators/A&P Left-sided epistaxis which has stopped, at least for the moment.  Elevated blood pressure, which may be related to her nosebleed.  Old records are reviewed confirming outpatient management of elevated blood pressure, prior ED visit for hypertension.  Labs obtained at triage showed mild anemia which is unchanged from baseline.  Topical tranexamic acid is applied.  Following application of tranexamic acid, there was no further bleeding.  She was observed for 45 minutes after removal of the tranexamic acid without recurrence of bleeding.  She is discharged with instructions to follow-up with ENT, monitor her blood pressure at home.  Final Clinical Impression(s) / ED Diagnoses Final diagnoses:  Left-sided epistaxis  Elevated blood pressure reading with diagnosis of hypertension  Normochromic normocytic anemia    Rx / DC Orders ED Discharge Orders    None       Delora Fuel, MD 62/83/15 [phone]

## 2020-07-13 ENCOUNTER — Other Ambulatory Visit: Payer: Self-pay

## 2020-07-13 ENCOUNTER — Encounter (HOSPITAL_COMMUNITY): Payer: Self-pay | Admitting: Emergency Medicine

## 2020-07-13 ENCOUNTER — Emergency Department (HOSPITAL_COMMUNITY)
Admission: EM | Admit: 2020-07-13 | Discharge: 2020-07-13 | Disposition: A | Discharge status: Home / Self Care | Payer: Medicare Other | Attending: Emergency Medicine | Admitting: Emergency Medicine

## 2020-07-13 DIAGNOSIS — Z87891 Personal history of nicotine dependence: Secondary | ICD-10-CM | POA: Insufficient documentation

## 2020-07-13 DIAGNOSIS — Z7982 Long term (current) use of aspirin: Secondary | ICD-10-CM | POA: Insufficient documentation

## 2020-07-13 DIAGNOSIS — R040 Epistaxis: Secondary | ICD-10-CM

## 2020-07-13 DIAGNOSIS — R7303 Prediabetes: Secondary | ICD-10-CM | POA: Insufficient documentation

## 2020-07-13 DIAGNOSIS — Z96651 Presence of right artificial knee joint: Secondary | ICD-10-CM | POA: Diagnosis not present

## 2020-07-13 DIAGNOSIS — Z79899 Other long term (current) drug therapy: Secondary | ICD-10-CM | POA: Insufficient documentation

## 2020-07-13 DIAGNOSIS — I10 Essential (primary) hypertension: Secondary | ICD-10-CM | POA: Insufficient documentation

## 2020-07-13 MED ORDER — SILVER NITRATE-POT NITRATE 75-25 % EX MISC
1.0000 "application " | Freq: Once | CUTANEOUS | Status: AC
  Administered 2020-07-13: 1 via TOPICAL
  Filled 2020-07-13: qty 10

## 2020-07-13 MED ORDER — OXYMETAZOLINE HCL 0.05 % NA SOLN
1.0000 | Freq: Once | NASAL | Status: AC
  Administered 2020-07-13: 1 via NASAL
  Filled 2020-07-13: qty 30

## 2020-07-13 MED ORDER — TRANEXAMIC ACID FOR EPISTAXIS
500.0000 mg | Freq: Once | TOPICAL | Status: AC
  Administered 2020-07-13: 500 mg via TOPICAL
  Filled 2020-07-13: qty 10

## 2020-07-13 NOTE — ED Notes (Signed)
Signature pad not working, pt verbalized understanding of discharge instructions

## 2020-07-13 NOTE — Discharge Instructions (Signed)
If this recurs please hold pressure for 15 minutes without peaking.  Attempted x2 if it does not work please come back to the ED.

## 2020-07-13 NOTE — ED Provider Notes (Signed)
[facility] DEPT Provider Note   CSN: [id number] Arrival date & time: 07/13/20  0004     History Chief Complaint  Patient presents with  . Epistaxis    Dawn Lawson is a 86 y.o. female.  86 yo F with a chief complaints of epistaxis. This started about 3 hours ago. Her son had tried to hold pressure for 15 minutes with what sounds like combat gauze in her nose. Had some transient improvement but then recurrence. She denies trauma to the area. Has had recurrent episodes of this. Was seen about a month ago and was given TXA with some improvement.  The history is provided by the patient and a relative.  Epistaxis Location:  L nare Severity:  Moderate Duration:  3 hours Timing:  Constant Progression:  Unchanged Chronicity:  New Relieved by:  Nothing Worsened by:  Nothing Ineffective treatments:  None tried Associated symptoms: no congestion, no dizziness, no fever and no headaches        Past Medical History:  Diagnosis Date  . AKI (acute kidney injury) ([place]) 03/22/2014  . Anemia   . Arthritis   . ATN (acute tubular necrosis) ([place]) 03/22/2014  . Cataract   . Heart murmur   . Hypertension   . Lactic acidosis 03/22/2014  . PONV (postoperative nausea and vomiting)   . Pre-diabetes   . Sepsis due to group B Streptococcus ([place]) 04/14/2014  . Severe sepsis with acute organ dysfunction ([place]) 03/22/2014  . Status post PICC central line placement 04/14/2014  . Urinary tract infection 03/22/2014    Patient Active Problem List   Diagnosis Date Noted  . Prediabetes 08/19/2019  . Mixed hyperlipidemia 08/19/2019  . 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 h/o Osteomyelitis ([place])- R leg-  07/16/2019  . Class 3 severe obesity with serious comorbidity in adult [facility]) 07/16/2019  . 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, nodules and fibrosis 07/16/2019  . 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 07/16/2019  . Family history of colon cancer  in mother- died age 52; patient refuses colonoscopy 07/16/2019  . Colonoscopy refused 07/16/2019  . Vaccine refused by patient- flu and others 07/16/2019  . Medically noncompliant 07/16/2019  . Hypertensive crisis 07/16/2019  . Hypertensive disorder 12/04/2017  . Abscess of right thigh 02/22/2017  . Macular degeneration 10/14/2014  . Vitamin D deficiency 10/14/2014  . HTN (hypertension) 10/14/2014  . Anemia 10/14/2014  . Arthritis 10/14/2014    Past Surgical History:  Procedure Laterality Date  . ABDOMINAL HYSTERECTOMY     partial  . APPENDECTOMY    . APPLICATION OF WOUND VAC Right 09/18/2016   Procedure: APPLICATION OF WOUND VAC;  Surgeon: Rod Can, MD;  Location: [facility];  Service: Orthopedics;  Laterality: Right;  . EXCISIONAL TOTAL KNEE ARTHROPLASTY WITH ANTIBIOTIC SPACERS Right 09/18/2016   Procedure: RESECTION OF RIGHT TOTAL KNEE ARTHROPLASTY PLACEMENT OF ANTIBIOTIC SPACER;  Surgeon: Rod Can, MD;  Location: [facility];  Service: Orthopedics;  Laterality: Right;  . EYE SURGERY    . JOINT REPLACEMENT Right    KNEE  . KNEE HARDWARE REMOVAL Right 11/24/2016  . RECTOCELE REPAIR    . TEE WITHOUT CARDIOVERSION N/A 04/09/2014   Procedure: TRANSESOPHAGEAL ECHOCARDIOGRAM (TEE);  Surgeon: Lelon Perla, MD;  Location: [facility] ENDOSCOPY;  Service: Cardiovascular;  Laterality: N/A;  . TOTAL KNEE ARTHROPLASTY WITH REVISION COMPONENTS Right 11/24/2016   Procedure: Removal of right knee spacer, Right knee arthrodesis;  Surgeon: Rod Can, MD;  Location: [facility];  Service: Orthopedics;  Laterality: Right;     OB History   No obstetric history on file.     Family History  Problem Relation Age of Onset  . Heart disease Mother   . Cancer Mother        colon  . Heart attack Father     Social History   Tobacco Use  . Smoking status: Former Smoker    Packs/day: 1.00    Years: 2.00    Pack years: 2.00    Types: Cigarettes    Quit date: 10/23/1976    Years since quitting: 43.7    . Smokeless tobacco: Never Used  Vaping Use  . Vaping Use: Never used  Substance Use Topics  . Alcohol use: No    Alcohol/week: 0.0 standard drinks  . Drug use: No    Home Medications Prior to Admission medications   Medication Sig Start Date End Date Taking? Authorizing Provider  acetaminophen (TYLENOL) 500 MG tablet Take 500-1,000 mg by mouth every 6 (six) hours as needed for moderate pain.    [provider]  aspirin EC 81 MG tablet Take 81 mg by mouth 2 (two) times daily.     [provider]  bisoprolol-hydrochlorothiazide (ZIAC) 5-6.25 MG tablet Take 1 tablet by mouth daily. 05/13/20   Lorrene Reid, PA-C  cephALEXin (KEFLEX) 500 MG capsule Take 1 capsule (500 mg total) by mouth 2 (two) times daily. 11/27/16   Swinteck, Aaron Edelman, MD  Dextran 70-Hypromellose (ARTIFICIAL TEARS PF OP) Place 1 drop into both eyes as needed (for dry eyes).    [provider]  losartan (COZAAR) 50 MG tablet TAKE 1 TABLET BY MOUTH DAILY 04/12/20   Lorrene Reid, PA-C  Multiple Vitamins-Minerals (MULTIVITAMIN PO) Take 1 tablet by mouth daily.    [provider]  Multiple Vitamins-Minerals (PRESERVISION AREDS PO) Take 1 capsule by mouth 2 (two) times daily.    [provider]  rosuvastatin (CRESTOR) 5 MG tablet 1 tab q hs every mon, wed and fri night 05/13/20   Lorrene Reid, PA-C  Vitamin D, Ergocalciferol, (DRISDOL) 1.25 MG (50000 UNIT) CAPS capsule Take one tablet wed and 1 tab Sun 01/05/20   Mellody Dance, DO    Allergies    Penicillins, Chocolate, Tape, and Ampicillin  Review of Systems   Review of Systems  Constitutional: Negative for chills and fever.  HENT: Positive for nosebleeds. Negative for congestion and rhinorrhea.   Eyes: Negative for redness and visual disturbance.  Respiratory: Negative for shortness of breath and wheezing.   Cardiovascular: Negative for chest pain and palpitations.  Gastrointestinal: Negative for nausea and vomiting.   Genitourinary: Negative for dysuria and urgency.  Musculoskeletal: Negative for arthralgias and myalgias.  Skin: Negative for pallor and wound.  Neurological: Negative for dizziness and headaches.    Physical Exam Updated Vital Signs BP (!) 177/42   Pulse (!) 47   Temp (!) 97.5 F (36.4 C) (Oral)   Resp 18   Ht 5\' 1"  (1.549 m)   Wt 82 kg   SpO2 93%   BMI 34.16 kg/m   Physical Exam Vitals and nursing note reviewed.  Constitutional:      General: She is not in acute distress.    Appearance: She is well-developed. She is not diaphoretic.  HENT:     Head: Normocephalic and atraumatic.     Nose:     Comments: Blood from the left naris. Eyes:     Pupils: Pupils are equal, round, and reactive to light.  Cardiovascular:     Rate and Rhythm: Normal rate and regular rhythm.     Heart sounds: No murmur heard.  No friction rub. No gallop.   Pulmonary:     Effort: Pulmonary effort is normal.     Breath sounds: No wheezing or rales.  Abdominal:     General: There is no distension.     Palpations: Abdomen is soft.     Tenderness: There is no abdominal tenderness.  Musculoskeletal:        General: No tenderness.     Cervical back: Normal range of motion and neck supple.  Skin:    General: Skin is warm and dry.  Neurological:     Mental Status: She is alert and oriented to person, place, and time.  Psychiatric:        Behavior: Behavior normal.     ED Results / Procedures / Treatments   Labs (all labs ordered are listed, but only abnormal results are displayed) Labs Reviewed - No data to display  EKG None  Radiology No results found.  Procedures .Epistaxis Management  Date/Time: 07/13/2020 12:55 AM Performed by: Deno Etienne, DO Authorized by: Deno Etienne, DO   Consent:    Consent obtained:  Verbal   Consent given by:  Patient   Risks discussed:  Bleeding, infection, nasal injury and pain   Alternatives discussed:  No treatment, delayed treatment and  alternative treatment Anesthesia (see MAR for exact dosages):    Anesthesia method:  None Procedure details:    Treatment site:  L anterior   Treatment method:  Silver nitrate (txa)   Treatment complexity:  Limited   Treatment episode: recurring   Post-procedure details:    Assessment:  Bleeding stopped   Patient tolerance of procedure:  Tolerated well, no immediate complications Comments:     Attempted packing with TXA with some mild residual bleeding seen at Kiesselbach's plexus.  Silver nitrate applied to the affected area.   (including critical care time)  Medications Ordered in ED Medications  oxymetazoline (AFRIN) 0.05 % nasal spray 1 spray (1 spray Each Nare Given 07/13/20 0024)  tranexamic acid (CYKLOKAPRON) 1000 MG/10ML topical solution 500 mg (500 mg Topical Given 07/13/20 0049)  silver nitrate applicators applicator 1 application (1 application Topical Given 07/13/20 0111)    ED Course  I have reviewed the triage vital signs and the nursing notes.  Pertinent labs & imaging results that were available during my care of the patient were reviewed by me and considered in my medical decision making (see chart for details).    MDM Rules/Calculators/A&P                          86 yo F with a chief complaints of epistaxis. Patient was here a month ago and had TXA applied with improvement. Will reattempt today. Reassess.  Patient had some signs of hypervascularity at [facility] plexus on the left.  These were cauterized with silver nitrate.  Was observed without any continued bleeding.  We will have her follow-up with ENT as needed.  1:45 AM:  I have discussed the diagnosis/risks/treatment options with the patient and family and believe the pt to be eligible for discharge home to follow-up with ENT. We also discussed returning to the ED immediately if new or worsening sx occur. We discussed the sx which are most concerning (e.g., bleeding that doesn't improve with direct  pressure) that necessitate immediate return. Medications administered to the patient  during their visit and any new prescriptions provided to the patient are listed below.  Medications given during this visit Medications  oxymetazoline (AFRIN) 0.05 % nasal spray 1 spray (1 spray Each Nare Given 07/13/20 0024)  tranexamic acid (CYKLOKAPRON) 1000 MG/10ML topical solution 500 mg (500 mg Topical Given 07/13/20 0049)  silver nitrate applicators applicator 1 application (1 application Topical Given 07/13/20 0111)     The patient appears reasonably screen and/or stabilized for discharge and I doubt any other medical condition or other [facility] requiring further screening, evaluation, or treatment in the ED at this time prior to discharge.   Final Clinical Impression(s) / ED Diagnoses Final diagnoses:  Epistaxis    Rx / DC Orders ED Discharge Orders    None       Deno Etienne, DO 07/13/20 0145

## 2020-07-13 NOTE — ED Triage Notes (Signed)
Pt arrived via EMS from home. Pt has a nosebleed that began about 3 hours ago. Pt has chronic hypertension and chronic nosebleeds. Pt is compliant with her blood pressure medication. Pt's original BP on scene for EMS was 220/130, pt's BP came down after taking her medicine about an hour ago. Pt denies dizziness.

## 2020-08-02 ENCOUNTER — Other Ambulatory Visit: Payer: Self-pay | Admitting: Physician Assistant

## 2020-08-02 DIAGNOSIS — E785 Hyperlipidemia, unspecified: Secondary | ICD-10-CM

## 2020-08-02 DIAGNOSIS — I119 Hypertensive heart disease without heart failure: Secondary | ICD-10-CM

## 2020-08-06 ENCOUNTER — Ambulatory Visit (INDEPENDENT_AMBULATORY_CARE_PROVIDER_SITE_OTHER): Payer: Medicare Other | Admitting: Physician Assistant

## 2020-08-06 ENCOUNTER — Other Ambulatory Visit: Payer: Self-pay

## 2020-08-06 ENCOUNTER — Encounter: Payer: Self-pay | Admitting: Physician Assistant

## 2020-08-06 VITALS — BP 164/56 | HR 51

## 2020-08-06 DIAGNOSIS — I10 Essential (primary) hypertension: Secondary | ICD-10-CM

## 2020-08-06 DIAGNOSIS — R040 Epistaxis: Secondary | ICD-10-CM

## 2020-08-06 DIAGNOSIS — I119 Hypertensive heart disease without heart failure: Secondary | ICD-10-CM

## 2020-08-06 DIAGNOSIS — R011 Cardiac murmur, unspecified: Secondary | ICD-10-CM | POA: Diagnosis not present

## 2020-08-06 NOTE — Patient Instructions (Signed)
Nosebleed  A nosebleed is when blood comes out of the nose. Nosebleeds are common. They are usually not a sign of a serious medical problem. Follow these instructions at home: When you have a nosebleed:  Sit down.  Tilt your head a little forward.  Follow these steps: 1. Pinch your nose with a clean towel or tissue. 2. Keep pinching your nose for 10 minutes. Do not let go. 3. After 10 minutes, let go of your nose. 4. If there is still bleeding, do these steps again. Keep doing these steps until the bleeding stops.  Do not put things in your nose to stop the bleeding.  Try not to lie down or put your head back.  Use a nose spray decongestant as told by your doctor.  Do not use petroleum jelly or mineral oil in your nose. These things can get into your lungs. After a nosebleed:  Try not to blow your nose or sniffle for several hours.  Try not to strain, lift, or bend at the waist for several days.  Use saline spray or a humidifier as told by your doctor.  Aspirin and blood-thinning medicines make bleeding more likely. If you take these medicines, ask your doctor if you should stop taking them, or if you should change how much you take. Do not stop taking the medicine unless your doctor tells you to. Contact a doctor if:  You have a fever.  You get nosebleeds often.  You are getting nosebleeds more often than usual.  You bruise very easily.  You have something stuck in your nose.  You have bleeding in your mouth.  You throw up (vomit) or cough up brown material.  You get a nosebleed after you start a new medicine. Get help right away if:  You have a nosebleed after you fall or hurt your head.  Your nosebleed does not go away after 20 minutes.  You feel dizzy or weak.  You have unusual bleeding from other parts of your body.  You have unusual bruising on other parts of your body.  You get sweaty.  You throw up blood. Summary  Nosebleeds are common. They  are usually not a sign of a serious medical problem.  When you have a nosebleed, sit down and tilt your head a little forward. Pinch your nose with a clean tissue.  After the bleeding stops, try not to blow your nose or sniffle for several hours. This information is not intended to replace advice given to you by your health care provider. Make sure you discuss any questions you have with your health care provider. Document Revised: 09/21/2017 Document Reviewed: 01/19/2017 Elsevier Patient Education  2020 Elsevier Inc.  

## 2020-08-06 NOTE — Progress Notes (Signed)
Established Patient Office Visit  Subjective:  Patient ID: Dawn Lawson, female    DOB: January 05, 1929  Age: 86 y.o. MRN: [id number]  CC:  Chief Complaint  Patient presents with  . Epistaxis    HPI [name] presents for recurrent epistaxis. Patient is accompanied by her son. Patient denies new episodes. No prior history of nosebleeds. No family history of bleeding disorders. Denies nose trauma or injury, seasonal allergies, dizziness, presyncope, chest pain, palpitations, or shortness of breath. Patient's son states she was mistakenly referred to Plastic Surgery instead of ENT.  HTN: Patient has seen cardiology in the past.  Patient and son were unhappy with cardiology evaluation and report nothing differently was done for hypertension.  Patient's son continues to check blood pressure at home and recently bought a new machine.  BP readings are usually 140s/60s and states diastolic is sometimes in the 40s.  Patient denies cardiac symptoms, dizziness, dyspnea or increased edema from baseline.   Past Medical History:  Diagnosis Date  . AKI (acute kidney injury) ([place]) 03/22/2014  . Anemia   . Arthritis   . ATN (acute tubular necrosis) ([place]) 03/22/2014  . Cataract   . Heart murmur   . Hypertension   . Lactic acidosis 03/22/2014  . PONV (postoperative nausea and vomiting)   . Pre-diabetes   . Sepsis due to group B Streptococcus ([place]) 04/14/2014  . Severe sepsis with acute organ dysfunction ([place]) 03/22/2014  . Status post PICC central line placement 04/14/2014  . Urinary tract infection 03/22/2014    Past Surgical History:  Procedure Laterality Date  . ABDOMINAL HYSTERECTOMY     partial  . APPENDECTOMY    . APPLICATION OF WOUND VAC Right 09/18/2016   Procedure: APPLICATION OF WOUND VAC;  Surgeon: Rod Can, MD;  Location: [facility];  Service: Orthopedics;  Laterality: Right;  . EXCISIONAL TOTAL KNEE ARTHROPLASTY WITH ANTIBIOTIC SPACERS Right 09/18/2016   Procedure: RESECTION OF  RIGHT TOTAL KNEE ARTHROPLASTY PLACEMENT OF ANTIBIOTIC SPACER;  Surgeon: Rod Can, MD;  Location: [facility];  Service: Orthopedics;  Laterality: Right;  . EYE SURGERY    . JOINT REPLACEMENT Right    KNEE  . KNEE HARDWARE REMOVAL Right 11/24/2016  . RECTOCELE REPAIR    . TEE WITHOUT CARDIOVERSION N/A 04/09/2014   Procedure: TRANSESOPHAGEAL ECHOCARDIOGRAM (TEE);  Surgeon: Lelon Perla, MD;  Location: [facility] ENDOSCOPY;  Service: Cardiovascular;  Laterality: N/A;  . TOTAL KNEE ARTHROPLASTY WITH REVISION COMPONENTS Right 11/24/2016   Procedure: Removal of right knee spacer, Right knee arthrodesis;  Surgeon: Rod Can, MD;  Location: [facility];  Service: Orthopedics;  Laterality: Right;    Family History  Problem Relation Age of Onset  . Heart disease Mother   . Cancer Mother        colon  . Heart attack Father     Social History   Socioeconomic History  . Marital status: Widowed    Spouse name: n/a  . Number of children: 3  . Years of education: Not on file  . Highest education level: Not on file  Occupational History  . Occupation: retired Biomedical scientist  Tobacco Use  . Smoking status: Former Smoker    Packs/day: 1.00    Years: 2.00    Pack years: 2.00    Types: Cigarettes    Quit date: 10/23/1976    Years since quitting: 43.8  . Smokeless tobacco: Never Used  Vaping Use  . Vaping Use: Never used  Substance and Sexual Activity  .  Alcohol use: No    Alcohol/week: 0.0 standard drinks  . Drug use: No  . Sexual activity: Not Currently    Birth control/protection: None  Other Topics Concern  . Not on file  Social History Narrative   Lives alone ("with the ghosts and the spiders.").   Her son lives nearby and helps provide care.   One daughter lives in [place], the other in [place].   Social Determinants of Health   Financial Resource Strain:   . Difficulty of Paying Living Expenses: Not on file  Food Insecurity:   . Worried About Charity fundraiser in the Last Year:  Not on file  . Ran Out of Food in the Last Year: Not on file  Transportation Needs:   . Lack of Transportation (Medical): Not on file  . Lack of Transportation (Non-Medical): Not on file  Physical Activity:   . Days of Exercise per Week: Not on file  . Minutes of Exercise per Session: Not on file  Stress:   . Feeling of Stress : Not on file  Social Connections:   . Frequency of Communication with Friends and Family: Not on file  . Frequency of Social Gatherings with Friends and Family: Not on file  . Attends Religious Services: Not on file  . Active Member of Clubs or Organizations: Not on file  . Attends Archivist Meetings: Not on file  . Marital Status: Not on file  Intimate Partner Violence:   . Fear of Current or Ex-Partner: Not on file  . Emotionally Abused: Not on file  . Physically Abused: Not on file  . Sexually Abused: Not on file    Outpatient Medications Prior to Visit  Medication Sig Dispense Refill  . acetaminophen (TYLENOL) 500 MG tablet Take 500-1,000 mg by mouth every 6 (six) hours as needed for moderate pain.    Marland Kitchen aspirin EC 81 MG tablet Take 81 mg by mouth 2 (two) times daily.     . bisoprolol-hydrochlorothiazide (ZIAC) 5-6.25 MG tablet TAKE 1 TABLET BY MOUTH DAILY. 90 tablet 0  . cephALEXin (KEFLEX) 500 MG capsule Take 1 capsule (500 mg total) by mouth 2 (two) times daily. 60 capsule 11  . Dextran 70-Hypromellose (ARTIFICIAL TEARS PF OP) Place 1 drop into both eyes as needed (for dry eyes).    Marland Kitchen losartan (COZAAR) 50 MG tablet TAKE 1 TABLET BY MOUTH DAILY 90 tablet 0  . Multiple Vitamins-Minerals (MULTIVITAMIN PO) Take 1 tablet by mouth daily.    . Multiple Vitamins-Minerals (PRESERVISION AREDS PO) Take 1 capsule by mouth 2 (two) times daily.    . rosuvastatin (CRESTOR) 5 MG tablet TAKE 1 TABLET BY MOUTH AT BEDTIME EVERY MONDAY, WEDNESDAY, AND FRIDAY NIGHT. 36 tablet 0  . Vitamin D, Ergocalciferol, (DRISDOL) 1.25 MG (50000 UNIT) CAPS capsule Take one  tablet wed and 1 tab Sun 24 capsule 3   No facility-administered medications prior to visit.    Allergies  Allergen Reactions  . Penicillins Hives and Itching    Tolerated Ancef 11/17 and cefepime 02/2017  Has patient had a PCN reaction causing immediate rash, facial/tongue/throat swelling, SOB or lightheadedness with hypotension: Yes Has patient had a PCN reaction causing severe rash involving mucus membranes or skin necrosis: No Has patient had a PCN reaction that required hospitalization: No Has patient had a PCN reaction occurring within the last 10 years:  # # YES # #  If all of the above answers are "NO", then may proceed with Cephalosporin  use.  . Chocolate Other (See Comments)    Migraines   . Tape Itching  . Ampicillin Hives and Itching    ROS Review of Systems A fourteen system review of systems was performed and found to be positive as per HPI.    Objective:    Physical Exam General:  Well nourished, appropriate for stated age, in no acute distress Neuro:  Alert and oriented,  extra-ocular muscles intact  HEENT:  Normocephalic, atraumatic, neck supple Skin:  no gross rash, warm, pink. Cardiac:  RRR, S1 S2, + murmur, no R/G Respiratory:  ECTA B/L and A/P, Not using accessory muscles, speaking in full sentences- unlabored. Vascular:  Ext warm, no cyanosis apprec.; gross edema noted Psych:  No HI/SI, judgement and insight good, Euthymic mood. Full Affect.   BP (!) 164/56   Pulse (!) 51   SpO2 98%  Wt Readings from Last 3 Encounters:  07/13/20 180 lb 12.4 oz (82 kg)  06/17/20 180 lb (81.6 kg)  01/05/20 190 lb (86.2 kg)     Health Maintenance Due  Topic Date Due  . COVID-19 Vaccine (1) Never done  . TETANUS/TDAP  Never done  . DEXA SCAN  Never done  . PNA vac Low Risk Adult (1 of 2 - PCV13) Never done  . INFLUENZA VACCINE  Never done    There are no preventive care reminders to display for this patient.  Lab Results  Component Value Date   TSH 2.180  08/15/2019   Lab Results  Component Value Date   WBC 9.0 06/17/2020   HGB 11.6 (L) 06/17/2020   HCT 37.5 06/17/2020   MCV 94.9 06/17/2020   PLT 208 06/17/2020   Lab Results  Component Value Date   NA 138 06/17/2020   K 4.8 06/17/2020   CO2 24 06/17/2020   GLUCOSE 125 (H) 06/17/2020   BUN 27 (H) 06/17/2020   CREATININE 0.99 06/17/2020   BILITOT 0.5 05/31/2020   ALKPHOS 70 05/31/2020   AST 17 05/31/2020   ALT 15 05/31/2020   PROT 7.1 05/31/2020   ALBUMIN 4.3 05/31/2020   CALCIUM 9.4 06/17/2020   ANIONGAP 12 06/17/2020   Lab Results  Component Value Date   CHOL 161 05/31/2020   Lab Results  Component Value Date   HDL 39 (L) 05/31/2020   Lab Results  Component Value Date   LDLCALC 88 05/31/2020   Lab Results  Component Value Date   TRIG 202 (H) 05/31/2020   Lab Results  Component Value Date   CHOLHDL 4.1 05/31/2020   Lab Results  Component Value Date   HGBA1C 6.1 (H) 12/29/2019      Assessment & Plan:   Problem List Items Addressed This Visit      Cardiovascular and Mediastinum   HTN (hypertension) (Chronic)   Relevant Orders   ECHOCARDIOGRAM COMPLETE   Hypertensive cardiomegaly   Relevant Orders   ECHOCARDIOGRAM COMPLETE    Other Visit Diagnoses    Recurrent epistaxis    -  Primary   Relevant Orders   CBC w/Diff   Ambulatory referral to ENT   Heart murmur       Relevant Orders   ECHOCARDIOGRAM COMPLETE     Hypertension, hypertensive cardiomegaly, heart murmur: -Systolic blood pressure elevated with diastolic in the 78H. Discussed with patient and son referral to cardiology and patient declined. Last echocardiogram performed in 2015 so will place order for new echocardiogram to evaluate for any changes from prior. -Continue current medication regimen. -Continue ambulatory  BP and pulse monitoring. -Recommend to stay well-hydrated. -Will continue to monitor.  Recurrent epistaxis: -Discussed with patient risk factors for nosebleeds including  hypertension and recommend to reduce aspirin to once daily. -Will place ENT referral. -Recommend to avoid nose trauma such as forceful nose blowing or nose picking. -Will collect CBC w/ diff to evaluate plate count for possible bleeding disorder.  No orders of the defined types were placed in this encounter.   Follow-up: Return for as scheduled.   Note:  This note was prepared with assistance of Dragon voice recognition software. Occasional wrong-word or sound-a-like substitutions may have occurred due to the inherent limitations of voice recognition software.   Lorrene Reid, PA-C

## 2020-08-07 LAB — CBC WITH DIFFERENTIAL/PLATELET
Basophils Absolute: 0.1 10*3/uL (ref 0.0–0.2)
Basos: 2 %
EOS (ABSOLUTE): 0.4 10*3/uL (ref 0.0–0.4)
Eos: 6 %
Hematocrit: 31.7 % — ABNORMAL LOW (ref 34.0–46.6)
Hemoglobin: 10.2 g/dL — ABNORMAL LOW (ref 11.1–15.9)
Immature Grans (Abs): 0 10*3/uL (ref 0.0–0.1)
Immature Granulocytes: 0 %
Lymphocytes Absolute: 1.1 10*3/uL (ref 0.7–3.1)
Lymphs: 17 %
MCH: 29.6 pg (ref 26.6–33.0)
MCHC: 32.2 g/dL (ref 31.5–35.7)
MCV: 92 fL (ref 79–97)
Monocytes Absolute: 0.7 10*3/uL (ref 0.1–0.9)
Monocytes: 11 %
Neutrophils Absolute: 3.9 10*3/uL (ref 1.4–7.0)
Neutrophils: 64 %
Platelets: 211 10*3/uL (ref 150–450)
RBC: 3.45 x10E6/uL — ABNORMAL LOW (ref 3.77–5.28)
RDW: 12.6 % (ref 11.7–15.4)
WBC: 6.1 10*3/uL (ref 3.4–10.8)

## 2020-08-27 ENCOUNTER — Other Ambulatory Visit: Payer: Self-pay

## 2020-08-27 ENCOUNTER — Other Ambulatory Visit: Payer: Medicare Other

## 2020-08-27 DIAGNOSIS — D508 Other iron deficiency anemias: Secondary | ICD-10-CM

## 2020-08-28 LAB — CBC WITH DIFFERENTIAL/PLATELET
Basophils Absolute: 0.1 10*3/uL (ref 0.0–0.2)
Basos: 2 %
EOS (ABSOLUTE): 0.4 10*3/uL (ref 0.0–0.4)
Eos: 6 %
Hematocrit: 28.2 % — ABNORMAL LOW (ref 34.0–46.6)
Hemoglobin: 9.2 g/dL — ABNORMAL LOW (ref 11.1–15.9)
Immature Grans (Abs): 0 10*3/uL (ref 0.0–0.1)
Immature Granulocytes: 0 %
Lymphocytes Absolute: 1.1 10*3/uL (ref 0.7–3.1)
Lymphs: 18 %
MCH: 30.2 pg (ref 26.6–33.0)
MCHC: 32.6 g/dL (ref 31.5–35.7)
MCV: 93 fL (ref 79–97)
Monocytes Absolute: 0.6 10*3/uL (ref 0.1–0.9)
Monocytes: 10 %
Neutrophils Absolute: 4 10*3/uL (ref 1.4–7.0)
Neutrophils: 64 %
Platelets: 249 10*3/uL (ref 150–450)
RBC: 3.05 x10E6/uL — ABNORMAL LOW (ref 3.77–5.28)
RDW: 12.5 % (ref 11.7–15.4)
WBC: 6.2 10*3/uL (ref 3.4–10.8)

## 2020-08-30 ENCOUNTER — Other Ambulatory Visit: Payer: Self-pay

## 2020-08-30 ENCOUNTER — Ambulatory Visit (HOSPITAL_COMMUNITY): Payer: Medicare Other | Attending: Physician Assistant

## 2020-08-30 DIAGNOSIS — I10 Essential (primary) hypertension: Secondary | ICD-10-CM

## 2020-08-30 DIAGNOSIS — R011 Cardiac murmur, unspecified: Secondary | ICD-10-CM | POA: Insufficient documentation

## 2020-08-30 DIAGNOSIS — I119 Hypertensive heart disease without heart failure: Secondary | ICD-10-CM | POA: Insufficient documentation

## 2020-08-30 LAB — ECHOCARDIOGRAM COMPLETE
Area-P 1/2: 4.96 cm2
P 1/2 time: 358 msec
S' Lateral: 2.8 cm

## 2020-08-31 ENCOUNTER — Other Ambulatory Visit: Payer: Self-pay | Admitting: Physician Assistant

## 2020-08-31 ENCOUNTER — Other Ambulatory Visit (INDEPENDENT_AMBULATORY_CARE_PROVIDER_SITE_OTHER): Payer: Medicare Other

## 2020-08-31 DIAGNOSIS — D649 Anemia, unspecified: Secondary | ICD-10-CM

## 2020-08-31 LAB — IFOBT (OCCULT BLOOD): IFOBT: NEGATIVE

## 2020-08-31 NOTE — Addendum Note (Signed)
Addended by: Mickel Crow on: 08/31/2020 11:07 AM   Modules accepted: Orders

## 2020-09-07 ENCOUNTER — Other Ambulatory Visit: Payer: Medicare Other

## 2020-09-07 ENCOUNTER — Other Ambulatory Visit: Payer: Self-pay

## 2020-09-07 DIAGNOSIS — D649 Anemia, unspecified: Secondary | ICD-10-CM

## 2020-09-08 LAB — IRON AND TIBC
Iron Saturation: 15 % (ref 15–55)
Iron: 43 ug/dL (ref 27–139)
Total Iron Binding Capacity: 289 ug/dL (ref 250–450)
UIBC: 246 ug/dL (ref 118–369)

## 2020-09-08 LAB — RETICULOCYTES: Retic Ct Pct: 2 % (ref 0.6–2.6)

## 2020-09-08 LAB — FERRITIN: Ferritin: 65 ng/mL (ref 15–150)

## 2020-09-08 LAB — TRANSFERRIN: Transferrin: 241 mg/dL (ref 149–313)

## 2020-10-04 ENCOUNTER — Other Ambulatory Visit: Payer: Self-pay

## 2020-10-04 ENCOUNTER — Encounter: Payer: Self-pay | Admitting: Physician Assistant

## 2020-10-04 ENCOUNTER — Ambulatory Visit (INDEPENDENT_AMBULATORY_CARE_PROVIDER_SITE_OTHER): Payer: Medicare Other | Admitting: Physician Assistant

## 2020-10-04 VITALS — BP 149/50 | HR 51 | Temp 96.6°F

## 2020-10-04 DIAGNOSIS — Z0000 Encounter for general adult medical examination without abnormal findings: Secondary | ICD-10-CM

## 2020-10-04 DIAGNOSIS — D649 Anemia, unspecified: Secondary | ICD-10-CM

## 2020-10-04 NOTE — Progress Notes (Signed)
Virtual Visit via Telephone Note:  I connected with Charlann Noss by telephone and verified that I am speaking with the correct person using two identifiers.    I discussed the limitations, risks, security and privacy concerns for performing an evaluation and management service by telephone and the availability of in person appointments. The staff discussed with patient that there may be a patient responsible charge related to this service. The patient expressed understanding and agreed to proceed.   Location of Patient- Home Location of Provider- Office     Subjective:   Dawn Lawson is a 86 y.o. female who presents for Medicare Annual (Subsequent) preventive examination.  Review of Systems    General:   No F/C, wt loss Pulm:   No DIB, SOB, pleuritic chest pain Card:  No CP, palpitations Abd:  No n/v/d or pain Ext:  No inc edema from baseline    Objective:    Today's Vitals   10/04/20 1007  BP: (!) 149/50  Pulse: (!) 51  Temp: (!) 96.6 F (35.9 C)  TempSrc: Oral   There is no height or weight on file to calculate BMI.  Advanced Directives 07/13/2020 06/17/2020 07/16/2019 07/16/2019 11/24/2016 09/15/2016 04/09/2014  Does Patient Have a Medical Advance Directive? No No Yes Yes Yes Yes Patient has advance directive, copy not in chart  Type of Advance Directive - - [place];Living will [place];Living will [place];Living will - [place];Living will  Does patient want to make changes to medical advance directive? - - - - No - Patient declined - -  Copy of [place] in Chart? - - No - copy available, Physician notified - No - copy requested - -  Would patient like information on creating a medical advance directive? No - Patient declined - - - - - -  Pre-existing out of facility DNR order (yellow form or pink MOST form) - - - - - - -    Current Medications (verified) Outpatient  Encounter Medications as of 10/04/2020  Medication Sig  . acetaminophen (TYLENOL) 500 MG tablet Take 500-1,000 mg by mouth every 6 (six) hours as needed for moderate pain.  . bisoprolol-hydrochlorothiazide (ZIAC) 5-6.25 MG tablet TAKE 1 TABLET BY MOUTH DAILY.  . cephALEXin (KEFLEX) 500 MG capsule Take 1 capsule (500 mg total) by mouth 2 (two) times daily.  Marland Kitchen Dextran 70-Hypromellose (ARTIFICIAL TEARS PF OP) Place 1 drop into both eyes as needed (for dry eyes).  Marland Kitchen losartan (COZAAR) 50 MG tablet TAKE 1 TABLET BY MOUTH DAILY  . Multiple Vitamins-Minerals (MULTIVITAMIN PO) Take 1 tablet by mouth daily.  . Multiple Vitamins-Minerals (PRESERVISION AREDS PO) Take 1 capsule by mouth 2 (two) times daily.  . rosuvastatin (CRESTOR) 5 MG tablet TAKE 1 TABLET BY MOUTH AT BEDTIME EVERY MONDAY, WEDNESDAY, AND FRIDAY NIGHT.  Marland Kitchen Vitamin D, Ergocalciferol, (DRISDOL) 1.25 MG (50000 UNIT) CAPS capsule Take one tablet wed and 1 tab Sun  . [DISCONTINUED] aspirin EC 81 MG tablet Take 81 mg by mouth 2 (two) times daily.    No facility-administered encounter medications on file as of 10/04/2020.    Allergies (verified) Penicillins, Chocolate, Tape, and Ampicillin   History: Past Medical History:  Diagnosis Date  . AKI (acute kidney injury) ([place]) 03/22/2014  . Anemia   . Arthritis   . ATN (acute tubular necrosis) ([place]) 03/22/2014  . Cataract   . Heart murmur   . Hypertension   .  Lactic acidosis 03/22/2014  . PONV (postoperative nausea and vomiting)   . Pre-diabetes   . Sepsis due to group B Streptococcus ([place]) 04/14/2014  . Severe sepsis with acute organ dysfunction ([place]) 03/22/2014  . Status post PICC central line placement 04/14/2014  . Urinary tract infection 03/22/2014   Past Surgical History:  Procedure Laterality Date  . ABDOMINAL HYSTERECTOMY     partial  . APPENDECTOMY    . APPLICATION OF WOUND VAC Right 09/18/2016   Procedure: APPLICATION OF WOUND VAC;  Surgeon: Rod Can, MD;  Location: [facility];   Service: Orthopedics;  Laterality: Right;  . EXCISIONAL TOTAL KNEE ARTHROPLASTY WITH ANTIBIOTIC SPACERS Right 09/18/2016   Procedure: RESECTION OF RIGHT TOTAL KNEE ARTHROPLASTY PLACEMENT OF ANTIBIOTIC SPACER;  Surgeon: Rod Can, MD;  Location: [facility];  Service: Orthopedics;  Laterality: Right;  . EYE SURGERY     Lens implants  . JOINT REPLACEMENT Right    KNEE  . KNEE HARDWARE REMOVAL Right 11/24/2016  . RECTOCELE REPAIR    . TEE WITHOUT CARDIOVERSION N/A 04/09/2014   Procedure: TRANSESOPHAGEAL ECHOCARDIOGRAM (TEE);  Surgeon: Lelon Perla, MD;  Location: [facility] ENDOSCOPY;  Service: Cardiovascular;  Laterality: N/A;  . TOTAL KNEE ARTHROPLASTY WITH REVISION COMPONENTS Right 11/24/2016   Procedure: Removal of right knee spacer, Right knee arthrodesis;  Surgeon: Rod Can, MD;  Location: [facility];  Service: Orthopedics;  Laterality: Right;   Family History  Problem Relation Age of Onset  . Heart disease Mother   . Cancer Mother        colon  . Heart attack Father    Social History   Socioeconomic History  . Marital status: Widowed    Spouse name: n/a  . Number of children: 3  . Years of education: Not on file  . Highest education level: Not on file  Occupational History  . Occupation: retired Biomedical scientist  Tobacco Use  . Smoking status: Former Smoker    Packs/day: 1.00    Years: 2.00    Pack years: 2.00    Types: Cigarettes    Quit date: 10/23/1976    Years since quitting: 43.9  . Smokeless tobacco: Never Used  Vaping Use  . Vaping Use: Never used  Substance and Sexual Activity  . Alcohol use: No    Alcohol/week: 0.0 standard drinks  . Drug use: No  . Sexual activity: Not Currently    Birth control/protection: None  Other Topics Concern  . Not on file  Social History Narrative   Lives alone ("with the ghosts and the spiders.").   Her son lives nearby and helps provide care.   One daughter lives in [place], the other in [place].   Social Determinants of Health    Financial Resource Strain: Not on file  Food Insecurity: Not on file  Transportation Needs: Not on file  Physical Activity: Not on file  Stress: Not on file  Social Connections: Not on file    Tobacco Counseling Counseling given: Not Answered    Diabetic? No         Activities of Daily Living In your present state of health, do you have any difficulty performing the following activities: 10/04/2020 08/06/2020  Hearing? N N  Vision? N N  Difficulty concentrating or making decisions? N N  Walking or climbing stairs? Y Y  Dressing or bathing? N N  Doing errands, shopping? Y N  Some recent data might be hidden    Patient Care Team: Lorrene Reid, PA-C as  PCP - General Milus Height, MD as Referring Physician (Ophthalmology) Rod Can, MD as Consulting Physician (Orthopedic Surgery)  Indicate any recent Medical Services you may have received from other than [facility] providers in the past year (date may be approximate).     Assessment:   This is a routine wellness examination for [place].  Hearing/Vision screen No exam data present  Dietary issues and exercise activities discussed: -Follow a heart healthy diet and stay well hydrated.  Goals   None    Depression Screen PHQ 2/9 Scores 10/04/2020 08/06/2020 05/31/2020 01/05/2020 10/20/2019 07/21/2019 07/16/2019  PHQ - 2 Score 0 0 0 0 0 0 0  PHQ- 9 Score 0 0 0 0 0 0 0    Fall Risk Fall Risk  10/04/2020 08/06/2020 05/31/2020 07/16/2019 10/31/2016  Falls in the past year? 0 0 0 1 No  Comment - - - - -  Number falls in past yr: - - - 0 -  Injury with Fall? - - - 0 -  Risk for fall due to : No Fall Risks - History of fall(s);Impaired balance/gait History of fall(s);Impaired balance/gait Impaired balance/gait;Impaired mobility  Follow up Falls evaluation completed Falls evaluation completed Falls evaluation completed Falls evaluation completed -    FALL RISK PREVENTION PERTAINING TO THE HOME:  Any stairs in or  around the home? Yes  If so, are there any without handrails? No  Home free of loose throw rugs in walkways, pet beds, electrical cords, etc? Yes  Adequate lighting in your home to reduce risk of falls? Yes   ASSISTIVE DEVICES UTILIZED TO PREVENT FALLS:  Life alert? Yes  Use of a cane, walker or w/c? Yes  Grab bars in the bathroom? Yes  Shower chair or bench in shower? No  Elevated toilet seat or a handicapped toilet? Yes   TIMED UP AND GO:  Was the test performed? No .  Length of time to ambulate  Pt not seen in office     Cognitive Function:   6CIT Screen 10/04/2020  What Year? 0 points  What month? 0 points  What time? 0 points  Count back from 20 0 points  Months in reverse 0 points  Repeat phrase 10 points  Total Score 10    Immunizations Immunization History  Administered Date(s) Administered  . Tdap 11/24/2016    TDAP status: Up to date  Flu Vaccine status: Declined, Education has been provided regarding the importance of this vaccine but patient still declined. Advised may receive this vaccine at local pharmacy or Health Dept. Aware to provide a copy of the vaccination record if obtained from local pharmacy or Health Dept. Verbalized acceptance and understanding.  Pneumococcal vaccine status: Declined,  Education has been provided regarding the importance of this vaccine but patient still declined. Advised may receive this vaccine at local pharmacy or Health Dept. Aware to provide a copy of the vaccination record if obtained from local pharmacy or Health Dept. Verbalized acceptance and understanding.   Covid-19 vaccine status: Declined, Education has been provided regarding the importance of this vaccine but patient still declined. Advised may receive this vaccine at local pharmacy or Health Dept.or vaccine clinic. Aware to provide a copy of the vaccination record if obtained from local pharmacy or Health Dept. Verbalized acceptance and understanding.  Qualifies  for Shingles Vaccine? Yes   Zostavax completed No   Shingrix Completed?: No.    Education has been provided regarding the importance of this vaccine. Patient has been advised to call  insurance company to determine out of pocket expense if they have not yet received this vaccine. Advised may also receive vaccine at local pharmacy or Health Dept. Verbalized acceptance and understanding.  Screening Tests Health Maintenance  Topic Date Due  . COVID-19 Vaccine (1) Never done  . DEXA SCAN  Never done  . PNA vac Low Risk Adult (1 of 2 - PCV13) Never done  . INFLUENZA VACCINE  Never done  . TETANUS/TDAP  11/24/2026    Health Maintenance  Health Maintenance Due  Topic Date Due  . COVID-19 Vaccine (1) Never done  . DEXA SCAN  Never done  . PNA vac Low Risk Adult (1 of 2 - PCV13) Never done  . INFLUENZA VACCINE  Never done    Colorectal cancer screening: No longer required.   Mammogram status: No longer required due to age.  Bone density Pt refused  Lung Cancer Screening: (Low Dose CT Chest recommended if Age 61-80 years, 30 pack-year currently smoking OR have quit w/in 15years.) does not qualify.   Additional Screening:  Hepatitis C Screening: does qualify; Pt declined  Vision Screening: Recommended annual ophthalmology exams for early detection of glaucoma and other disorders of the eye. Is the patient up to date with their annual eye exam?  No  Who is the provider or what is the name of the office in which the patient attends annual eye exams?  If pt is not established with a provider, would they like to be referred to a provider to establish care? No . Reports no issues.  Dental Screening: Recommended annual dental exams for proper oral hygiene  Community Resource Referral / Chronic Care Management: CRR required this visit?  No   CCM required this visit?  No      Plan:  -Continue current medication regimen. -Patient prefers to obtain blood work at next visit vs lab  appointment.  -Patient denies cognition or memory changes. No FHx of dementia. Will continue to monitor. -Follow up in 3 months for HTN, anemia and blood work (cmp, cbc, a1c)  I have personally reviewed and noted the following in the patient's chart:   . Medical and social history . Use of alcohol, tobacco or illicit drugs  . Current medications and supplements . Functional ability and status . Nutritional status . Physical activity . Advanced directives . List of other physicians . Hospitalizations, surgeries, and ER visits in previous 12 months . Vitals . Screenings to include cognitive, depression, and falls . Referrals and appointments  In addition, I have reviewed and discussed with patient certain preventive protocols, quality metrics, and best practice recommendations. A written personalized care plan for preventive services as well as general preventive health recommendations were provided to patient.

## 2020-10-11 ENCOUNTER — Other Ambulatory Visit: Payer: Self-pay | Admitting: Family Medicine

## 2020-10-11 DIAGNOSIS — I119 Hypertensive heart disease without heart failure: Secondary | ICD-10-CM

## 2020-10-13 NOTE — Telephone Encounter (Signed)
Patient would like the refill on Losartan. She uses [facility]. Thank you

## 2020-10-25 ENCOUNTER — Other Ambulatory Visit: Payer: Self-pay | Admitting: Physician Assistant

## 2020-10-25 DIAGNOSIS — E785 Hyperlipidemia, unspecified: Secondary | ICD-10-CM

## 2020-11-01 ENCOUNTER — Other Ambulatory Visit: Payer: Self-pay | Admitting: Physician Assistant

## 2020-11-01 DIAGNOSIS — I119 Hypertensive heart disease without heart failure: Secondary | ICD-10-CM

## 2020-11-03 ENCOUNTER — Telehealth: Payer: Self-pay | Admitting: Physician Assistant

## 2020-11-03 NOTE — Telephone Encounter (Signed)
This medication was refilled on 11/01/2020 with receipt with confirmation of being received from pharmacy. Patient son should contact pharmacy. AS, CM

## 2020-12-11 ENCOUNTER — Other Ambulatory Visit: Payer: Self-pay | Admitting: Family Medicine

## 2020-12-11 DIAGNOSIS — E559 Vitamin D deficiency, unspecified: Secondary | ICD-10-CM

## 2020-12-15 ENCOUNTER — Telehealth: Payer: Self-pay | Admitting: Physician Assistant

## 2020-12-15 DIAGNOSIS — E559 Vitamin D deficiency, unspecified: Secondary | ICD-10-CM

## 2020-12-15 MED ORDER — VITAMIN D (ERGOCALCIFEROL) 1.25 MG (50000 UNIT) PO CAPS: ORAL_CAPSULE | ORAL | 0 refills | Status: DC

## 2020-12-15 NOTE — Telephone Encounter (Signed)
Patient's son Kasandra Knudsen called in to request a refill for his Mother's Vitamin D prescription and uses [facility]. Thanks

## 2020-12-15 NOTE — Addendum Note (Signed)
Addended by: Mickel Crow on: 12/15/2020 11:51 AM   Modules accepted: Orders

## 2021-01-03 ENCOUNTER — Ambulatory Visit (INDEPENDENT_AMBULATORY_CARE_PROVIDER_SITE_OTHER): Payer: Medicare Other | Admitting: Physician Assistant

## 2021-01-03 ENCOUNTER — Other Ambulatory Visit: Payer: Self-pay

## 2021-01-03 ENCOUNTER — Other Ambulatory Visit: Payer: Self-pay | Admitting: Physician Assistant

## 2021-01-03 ENCOUNTER — Encounter: Payer: Self-pay | Admitting: Physician Assistant

## 2021-01-03 VITALS — BP 173/63 | HR 49 | Temp 97.9°F | Ht 61.0 in | Wt 190.0 lb

## 2021-01-03 DIAGNOSIS — E559 Vitamin D deficiency, unspecified: Secondary | ICD-10-CM

## 2021-01-03 DIAGNOSIS — D649 Anemia, unspecified: Secondary | ICD-10-CM | POA: Diagnosis not present

## 2021-01-03 DIAGNOSIS — I119 Hypertensive heart disease without heart failure: Secondary | ICD-10-CM | POA: Diagnosis not present

## 2021-01-03 DIAGNOSIS — R7303 Prediabetes: Secondary | ICD-10-CM | POA: Diagnosis not present

## 2021-01-03 DIAGNOSIS — E785 Hyperlipidemia, unspecified: Secondary | ICD-10-CM | POA: Diagnosis not present

## 2021-01-03 DIAGNOSIS — E782 Mixed hyperlipidemia: Secondary | ICD-10-CM

## 2021-01-03 MED ORDER — ROSUVASTATIN CALCIUM 5 MG PO TABS: ORAL_TABLET | ORAL | 1 refills | Status: DC

## 2021-01-03 NOTE — Progress Notes (Signed)
Established Patient Office Visit  Subjective:  Patient ID: Dawn Lawson, female    DOB: September 01, 1929  Age: 87 y.o. MRN: [id number]  CC:  Chief Complaint  Patient presents with  . Follow-up    HPI Dawn Lawson presents for follow up on hypertension, anemia and hyperlipidemia.  HTN: Pt denies chest pain, palpitations, dizziness or headache. Lower extremity edema has been stable. Taking medication as directed without side effects. Continues to check BP at home and readings range 130-140s/40-50s. Pt continues to monitor sodium.  HLD: Pt taking medication as directed without issues.    Anemia: Denies fatigue, melena or hematochezia.  Vitamin D deficiency: Reports compliance with Vitamin D supplement.  Past Medical History:  Diagnosis Date  . AKI (acute kidney injury) ([place]) 03/22/2014  . Anemia   . Arthritis   . ATN (acute tubular necrosis) ([place]) 03/22/2014  . Cataract   . Heart murmur   . Hypertension   . Lactic acidosis 03/22/2014  . PONV (postoperative nausea and vomiting)   . Pre-diabetes   . Sepsis due to group B Streptococcus ([place]) 04/14/2014  . Severe sepsis with acute organ dysfunction ([place]) 03/22/2014  . Status post PICC central line placement 04/14/2014  . Urinary tract infection 03/22/2014    Past Surgical History:  Procedure Laterality Date  . ABDOMINAL HYSTERECTOMY     partial  . APPENDECTOMY    . APPLICATION OF WOUND VAC Right 09/18/2016   Procedure: APPLICATION OF WOUND VAC;  Surgeon: Rod Can, MD;  Location: [facility];  Service: Orthopedics;  Laterality: Right;  . EXCISIONAL TOTAL KNEE ARTHROPLASTY WITH ANTIBIOTIC SPACERS Right 09/18/2016   Procedure: RESECTION OF RIGHT TOTAL KNEE ARTHROPLASTY PLACEMENT OF ANTIBIOTIC SPACER;  Surgeon: Rod Can, MD;  Location: [facility];  Service: Orthopedics;  Laterality: Right;  . EYE SURGERY     Lens implants  . JOINT REPLACEMENT Right    KNEE  . KNEE HARDWARE REMOVAL Right 11/24/2016  . RECTOCELE REPAIR    . TEE  WITHOUT CARDIOVERSION N/A 04/09/2014   Procedure: TRANSESOPHAGEAL ECHOCARDIOGRAM (TEE);  Surgeon: Lelon Perla, MD;  Location: [facility] ENDOSCOPY;  Service: Cardiovascular;  Laterality: N/A;  . TOTAL KNEE ARTHROPLASTY WITH REVISION COMPONENTS Right 11/24/2016   Procedure: Removal of right knee spacer, Right knee arthrodesis;  Surgeon: Rod Can, MD;  Location: [facility];  Service: Orthopedics;  Laterality: Right;    Family History  Problem Relation Age of Onset  . Heart disease Mother   . Cancer Mother        colon  . Heart attack Father     Social History   Socioeconomic History  . Marital status: Widowed    Spouse name: n/a  . Number of children: 3  . Years of education: Not on file  . Highest education level: Not on file  Occupational History  . Occupation: retired Biomedical scientist  Tobacco Use  . Smoking status: Former Smoker    Packs/day: 1.00    Years: 2.00    Pack years: 2.00    Types: Cigarettes    Quit date: 10/23/1976    Years since quitting: 44.2  . Smokeless tobacco: Never Used  Vaping Use  . Vaping Use: Never used  Substance and Sexual Activity  . Alcohol use: No    Alcohol/week: 0.0 standard drinks  . Drug use: No  . Sexual activity: Not Currently    Birth control/protection: None  Other Topics Concern  . Not on file  Social History Narrative  Lives alone ("with the ghosts and the spiders.").   Her son lives nearby and helps provide care.   One daughter lives in [place], the other in [place].   Social Determinants of Health   Financial Resource Strain: Not on file  Food Insecurity: Not on file  Transportation Needs: Not on file  Physical Activity: Not on file  Stress: Not on file  Social Connections: Not on file  Intimate Partner Violence: Not on file    Outpatient Medications Prior to Visit  Medication Sig Dispense Refill  . acetaminophen (TYLENOL) 500 MG tablet Take 500-1,000 mg by mouth every 6 (six) hours as needed for moderate pain.    .  bisoprolol-hydrochlorothiazide (ZIAC) 5-6.25 MG tablet TAKE 1 TABLET BY MOUTH DAILY. 90 tablet 0  . cephALEXin (KEFLEX) 500 MG capsule Take 1 capsule (500 mg total) by mouth 2 (two) times daily. 60 capsule 11  . Dextran 70-Hypromellose (ARTIFICIAL TEARS PF OP) Place 1 drop into both eyes as needed (for dry eyes).    Marland Kitchen losartan (COZAAR) 50 MG tablet TAKE 1/2 TAB BY MOUTH DAILY FOR 8 DAYS, THEN 1 TAB DAILY FOR 8 DAYS, INCREASE TO 2 TABLETS DAILY ONLY IF BLOOD PRESSURE HIGHER THAN 140/90 CONSISTENTLY 90 tablet 0  . Multiple Vitamins-Minerals (MULTIVITAMIN PO) Take 1 tablet by mouth daily.    . Multiple Vitamins-Minerals (PRESERVISION AREDS PO) Take 1 capsule by mouth 2 (two) times daily.    . Vitamin D, Ergocalciferol, (DRISDOL) 1.25 MG (50000 UNIT) CAPS capsule Take one tablet wed and 1 tab Sun 24 capsule 0  . rosuvastatin (CRESTOR) 5 MG tablet TAKE 1 TABLET BY MOUTH AT BEDTIME EVERY MONDAY, WEDNESDAY, AND FRIDAY NIGHT. 36 tablet 0   No facility-administered medications prior to visit.    Allergies  Allergen Reactions  . Penicillins Hives and Itching    Tolerated Ancef 11/17 and cefepime 02/2017  Has patient had a PCN reaction causing immediate rash, facial/tongue/throat swelling, SOB or lightheadedness with hypotension: Yes Has patient had a PCN reaction causing severe rash involving mucus membranes or skin necrosis: No Has patient had a PCN reaction that required hospitalization: No Has patient had a PCN reaction occurring within the last 10 years:  # # YES # #  If all of the above answers are "NO", then may proceed with Cephalosporin use.  . Chocolate Other (See Comments)    Migraines   . Tape Itching  . Ampicillin Hives and Itching    ROS Review of Systems A fourteen system review of systems was performed and found to be positive as per HPI.    Objective:    Physical Exam General:  Pleasant and cooperative, in no acute distress  Neuro:  Alert and oriented,  extra-ocular muscles  intact  HEENT:  Normocephalic, atraumatic, neck supple Skin:  no gross rash, warm, pink. Cardiac:  RRR, S1 S2 wnl's, +murmur  Respiratory:  ECTA, Not using accessory muscles, speaking in full sentences- unlabored. Vascular:  Ext warm, no cyanosis apprec.; cap RF less 2 sec. + pitting edema b/l Psych:  No HI/SI, judgement and insight good, Euthymic mood. Full Affect.   BP (!) 173/63   Pulse (!) 49   Temp 97.9 F (36.6 C)   Ht '5\' 1"'  (1.549 m)   Wt 190 lb (86.2 kg)   SpO2 98%   BMI 35.90 kg/m  Wt Readings from Last 3 Encounters:  01/03/21 190 lb (86.2 kg)  07/13/20 180 lb 12.4 oz (82 kg)  06/17/20 180 lb (81.6  kg)     Health Maintenance Due  Topic Date Due  . COVID-19 Vaccine (1) Never done  . DEXA SCAN  Never done  . PNA vac Low Risk Adult (1 of 2 - PCV13) Never done  . INFLUENZA VACCINE  Never done    There are no preventive care reminders to display for this patient.  Lab Results  Component Value Date   TSH 2.180 08/15/2019   Lab Results  Component Value Date   WBC 6.2 08/27/2020   HGB 9.2 (L) 08/27/2020   HCT 28.2 (L) 08/27/2020   MCV 93 08/27/2020   PLT 249 08/27/2020   Lab Results  Component Value Date   NA 138 06/17/2020   K 4.8 06/17/2020   CO2 24 06/17/2020   GLUCOSE 125 (H) 06/17/2020   BUN 27 (H) 06/17/2020   CREATININE 0.99 06/17/2020   BILITOT 0.5 05/31/2020   ALKPHOS 70 05/31/2020   AST 17 05/31/2020   ALT 15 05/31/2020   PROT 7.1 05/31/2020   ALBUMIN 4.3 05/31/2020   CALCIUM 9.4 06/17/2020   ANIONGAP 12 06/17/2020   Lab Results  Component Value Date   CHOL 161 05/31/2020   Lab Results  Component Value Date   HDL 39 (L) 05/31/2020   Lab Results  Component Value Date   LDLCALC 88 05/31/2020   Lab Results  Component Value Date   TRIG 202 (H) 05/31/2020   Lab Results  Component Value Date   CHOLHDL 4.1 05/31/2020   Lab Results  Component Value Date   HGBA1C 6.1 (H) 12/29/2019      Assessment & Plan:   Problem List  Items Addressed This Visit      Cardiovascular and Mediastinum   Hypertensive cardiomegaly - Primary    -Stable. Patient's BP usually elevated in office, ambulatory BP readings stable. -Continue current medication regimen. -Continue ambulatory BP monitoring. -Will collect CMP today for medication monitoring.      Relevant Medications   rosuvastatin (CRESTOR) 5 MG tablet   Other Relevant Orders   Comp Met (CMET)     Other   Anemia (Chronic)    -Asymptomatic. -Will repeat CBC today.      Relevant Orders   CBC w/Diff   Vitamin D deficiency    -Last Vitamin D 51.7, will repeat Vitamin D today. Pending results will make treatment adjustments if indicated. On Vit D 50,000 units twice weekly.       Relevant Orders   Vitamin D (25 hydroxy)   Prediabetes    -Last A1c 6.1, stable. -Will repeat A1c today.      Relevant Orders   HgB A1c   Mixed hyperlipidemia    -Last lipid panel: total cholesterol 161, triglycerides 202, HDL 39, LDL 88 -Continue current medication regimen. -Follow a heart healthy diet. Patient is wheelchair bound so activity is limited. -Will repeat lipid panel and hepatic function today.      Relevant Medications   rosuvastatin (CRESTOR) 5 MG tablet   Other Relevant Orders   Lipid Profile    Other Visit Diagnoses    Dyslipidemia (high LDL; low HDL)       Relevant Medications   rosuvastatin (CRESTOR) 5 MG tablet      Meds ordered this encounter  Medications  . rosuvastatin (CRESTOR) 5 MG tablet    Sig: TAKE 1 TABLET BY MOUTH AT BEDTIME EVERY MONDAY, WEDNESDAY, AND FRIDAY NIGHT.    Dispense:  36 tablet    Refill:  1    Order  Specific Question:   Supervising Provider    Answer:   Beatrice Lecher D [[id number]    Follow-up: Return in about 6 months (around 07/06/2021) for HTN, HLD, PreDM.    Lorrene Reid, PA-C

## 2021-01-03 NOTE — Assessment & Plan Note (Signed)
-  Last A1c 6.1, stable. -Will repeat A1c today.

## 2021-01-03 NOTE — Assessment & Plan Note (Signed)
-  Asymptomatic. -Will repeat CBC today.

## 2021-01-03 NOTE — Assessment & Plan Note (Signed)
-  Last Vitamin D 51.7, will repeat Vitamin D today. Pending results will make treatment adjustments if indicated. On Vit D 50,000 units twice weekly.

## 2021-01-03 NOTE — Assessment & Plan Note (Signed)
-  Last lipid panel: total cholesterol 161, triglycerides 202, HDL 39, LDL 88 -Continue current medication regimen. -Follow a heart healthy diet. Patient is wheelchair bound so activity is limited. -Will repeat lipid panel and hepatic function today.

## 2021-01-03 NOTE — Patient Instructions (Signed)

## 2021-01-03 NOTE — Assessment & Plan Note (Signed)
-  Stable. Patient's BP usually elevated in office, ambulatory BP readings stable. -Continue current medication regimen. -Continue ambulatory BP monitoring. -Will collect CMP today for medication monitoring.

## 2021-01-04 LAB — COMPREHENSIVE METABOLIC PANEL
ALT: 11 IU/L (ref 0–32)
AST: 16 IU/L (ref 0–40)
Albumin/Globulin Ratio: 1.4 (ref 1.2–2.2)
Albumin: 4.2 g/dL (ref 3.6–4.6)
Alkaline Phosphatase: 73 IU/L (ref 44–121)
BUN/Creatinine Ratio: 27 (ref 12–28)
BUN: 30 mg/dL — ABNORMAL HIGH (ref 8–27)
Bilirubin Total: 0.3 mg/dL (ref 0.0–1.2)
CO2: 21 mmol/L (ref 20–29)
Calcium: 9.4 mg/dL (ref 8.7–10.3)
Chloride: 103 mmol/L (ref 96–106)
Creatinine, Ser: 1.12 mg/dL — ABNORMAL HIGH (ref 0.57–1.00)
Globulin, Total: 3 g/dL (ref 1.5–4.5)
Glucose: 116 mg/dL — ABNORMAL HIGH (ref 65–99)
Potassium: 4.9 mmol/L (ref 3.5–5.2)
Sodium: 142 mmol/L (ref 134–144)
Total Protein: 7.2 g/dL (ref 6.0–8.5)
eGFR: 47 mL/min/{1.73_m2} — ABNORMAL LOW (ref >59)

## 2021-01-04 LAB — CBC WITH DIFFERENTIAL/PLATELET
Basophils Absolute: 0.1 10*3/uL (ref 0.0–0.2)
Basos: 1 %
EOS (ABSOLUTE): 0.3 10*3/uL (ref 0.0–0.4)
Eos: 5 %
Hematocrit: 35.7 % (ref 34.0–46.6)
Hemoglobin: 11.5 g/dL (ref 11.1–15.9)
Immature Grans (Abs): 0 10*3/uL (ref 0.0–0.1)
Immature Granulocytes: 0 %
Lymphocytes Absolute: 1.5 10*3/uL (ref 0.7–3.1)
Lymphs: 23 %
MCH: 28.2 pg (ref 26.6–33.0)
MCHC: 32.2 g/dL (ref 31.5–35.7)
MCV: 88 fL (ref 79–97)
Monocytes Absolute: 0.8 10*3/uL (ref 0.1–0.9)
Monocytes: 12 %
Neutrophils Absolute: 3.8 10*3/uL (ref 1.4–7.0)
Neutrophils: 59 %
Platelets: 226 10*3/uL (ref 150–450)
RBC: 4.08 x10E6/uL (ref 3.77–5.28)
RDW: 14.3 % (ref 11.7–15.4)
WBC: 6.6 10*3/uL (ref 3.4–10.8)

## 2021-01-04 LAB — LIPID PANEL
Chol/HDL Ratio: 4 ratio (ref 0.0–4.4)
Cholesterol, Total: 157 mg/dL (ref 100–199)
HDL: 39 mg/dL — ABNORMAL LOW (ref >39)
LDL Chol Calc (NIH): 90 mg/dL (ref 0–99)
Triglycerides: 159 mg/dL — ABNORMAL HIGH (ref 0–149)
VLDL Cholesterol Cal: 28 mg/dL (ref 5–40)

## 2021-01-04 LAB — HEMOGLOBIN A1C
Est. average glucose Bld gHb Est-mCnc: 143 mg/dL
Hgb A1c MFr Bld: 6.6 % — ABNORMAL HIGH (ref 4.8–5.6)

## 2021-01-04 LAB — VITAMIN D 25 HYDROXY (VIT D DEFICIENCY, FRACTURES): Vit D, 25-Hydroxy: 64.5 ng/mL (ref 30.0–100.0)

## 2021-01-08 ENCOUNTER — Other Ambulatory Visit: Payer: Self-pay | Admitting: Physician Assistant

## 2021-01-08 DIAGNOSIS — I119 Hypertensive heart disease without heart failure: Secondary | ICD-10-CM

## 2021-01-24 ENCOUNTER — Other Ambulatory Visit: Payer: Self-pay | Admitting: Physician Assistant

## 2021-01-24 DIAGNOSIS — I119 Hypertensive heart disease without heart failure: Secondary | ICD-10-CM

## 2021-04-09 ENCOUNTER — Other Ambulatory Visit: Payer: Self-pay | Admitting: Physician Assistant

## 2021-04-09 DIAGNOSIS — I119 Hypertensive heart disease without heart failure: Secondary | ICD-10-CM

## 2021-04-23 ENCOUNTER — Other Ambulatory Visit: Payer: Self-pay | Admitting: Physician Assistant

## 2021-04-23 DIAGNOSIS — I119 Hypertensive heart disease without heart failure: Secondary | ICD-10-CM

## 2021-04-23 DIAGNOSIS — E559 Vitamin D deficiency, unspecified: Secondary | ICD-10-CM

## 2021-07-02 ENCOUNTER — Other Ambulatory Visit: Payer: Self-pay | Admitting: Physician Assistant

## 2021-07-02 DIAGNOSIS — E785 Hyperlipidemia, unspecified: Secondary | ICD-10-CM

## 2021-07-02 DIAGNOSIS — I119 Hypertensive heart disease without heart failure: Secondary | ICD-10-CM

## 2021-07-06 ENCOUNTER — Ambulatory Visit (INDEPENDENT_AMBULATORY_CARE_PROVIDER_SITE_OTHER): Payer: Medicare Other | Admitting: Physician Assistant

## 2021-07-06 ENCOUNTER — Encounter: Payer: Self-pay | Admitting: Physician Assistant

## 2021-07-06 ENCOUNTER — Other Ambulatory Visit: Payer: Self-pay

## 2021-07-06 VITALS — BP 150/76 | HR 70 | Temp 98.5°F

## 2021-07-06 DIAGNOSIS — E782 Mixed hyperlipidemia: Secondary | ICD-10-CM | POA: Diagnosis not present

## 2021-07-06 DIAGNOSIS — R7303 Prediabetes: Secondary | ICD-10-CM | POA: Diagnosis not present

## 2021-07-06 DIAGNOSIS — I119 Hypertensive heart disease without heart failure: Secondary | ICD-10-CM

## 2021-07-06 NOTE — Assessment & Plan Note (Signed)
-  Last lipid panel: total cholesterol 157, triglycerides 159, HDL 39, LDL 90 -Continue current medication regimen. Follow low fat diet. -Will repeat lipid panel and hepatic function today.

## 2021-07-06 NOTE — Assessment & Plan Note (Signed)
-  Last A1c 6.6, will repeat today. -Recommend to continue with low glucose intake and monitor carbohydrates.

## 2021-07-06 NOTE — Progress Notes (Signed)
Established Patient Office Visit  Subjective:  Patient ID: Dawn Lawson, female    DOB: [birth year]  Age: 87 y.o. MRN: [id number]  CC:  Chief Complaint  Patient presents with   Follow-up    PreDM   Hypertension   Hyperlipidemia    HPI Dawn Lawson presents for follow up on hypertension, hyperlipidemia and prediabetes. Patient has no acute concerns.  HTN: Pt denies chest pain, palpitations, dizziness or worsening lower extremity swelling. Taking medication as directed without side effects. Checks BP at home and readings range <140/90. Pt follows a low salt diet.  HLD: Pt taking medication as directed without issues. Follows a balanced diet.  Prediabetes: Denies increased thirst or urination. Reports limits sugar intake.   Past Medical History:  Diagnosis Date   AKI (acute kidney injury) ([place]) 03/22/2014   Anemia    Arthritis    ATN (acute tubular necrosis) ([place]) 03/22/2014   Cataract    Heart murmur    Hypertension    Lactic acidosis 03/22/2014   PONV (postoperative nausea and vomiting)    Pre-diabetes    Sepsis due to group B Streptococcus ([place]) 04/14/2014   Severe sepsis with acute organ dysfunction ([place]) 03/22/2014   Status post PICC central line placement 04/14/2014   Urinary tract infection 03/22/2014    Past Surgical History:  Procedure Laterality Date   ABDOMINAL HYSTERECTOMY     partial   APPENDECTOMY     APPLICATION OF WOUND VAC Right 09/18/2016   Procedure: APPLICATION OF WOUND VAC;  Surgeon: Rod Can, MD;  Location: [facility];  Service: Orthopedics;  Laterality: Right;   EXCISIONAL TOTAL KNEE ARTHROPLASTY WITH ANTIBIOTIC SPACERS Right 09/18/2016   Procedure: RESECTION OF RIGHT TOTAL KNEE ARTHROPLASTY PLACEMENT OF ANTIBIOTIC SPACER;  Surgeon: Rod Can, MD;  Location: [facility];  Service: Orthopedics;  Laterality: Right;   EYE SURGERY     Lens implants   JOINT REPLACEMENT Right    KNEE   KNEE HARDWARE REMOVAL Right 11/24/2016   RECTOCELE REPAIR      TEE WITHOUT CARDIOVERSION N/A 04/09/2014   Procedure: TRANSESOPHAGEAL ECHOCARDIOGRAM (TEE);  Surgeon: Lelon Perla, MD;  Location: [facility] ENDOSCOPY;  Service: Cardiovascular;  Laterality: N/A;   TOTAL KNEE ARTHROPLASTY WITH REVISION COMPONENTS Right 11/24/2016   Procedure: Removal of right knee spacer, Right knee arthrodesis;  Surgeon: Rod Can, MD;  Location: [facility];  Service: Orthopedics;  Laterality: Right;    Family History  Problem Relation Age of Onset   Heart disease Mother    Cancer Mother        colon   Heart attack Father     Social History   Socioeconomic History   Marital status: Widowed    Spouse name: n/a   Number of children: 3   Years of education: Not on file   Highest education level: Not on file  Occupational History   Occupation: retired Biomedical scientist  Tobacco Use   Smoking status: Former    Packs/day: 1.00    Years: 2.00    Pack years: 2.00    Types: Cigarettes    Quit date: 10/23/1976    Years since quitting: 44.7   Smokeless tobacco: Never  Vaping Use   Vaping Use: Never used  Substance and Sexual Activity   Alcohol use: No    Alcohol/week: 0.0 standard drinks   Drug use: No   Sexual activity: Not Currently    Birth control/protection: None  Other Topics Concern   Not on file  Social History Narrative   Lives alone ("with the ghosts and the spiders.").   Her son lives nearby and helps provide care.   One daughter lives in [place], the other in [place].   Social Determinants of Health   Financial Resource Strain: Not on file  Food Insecurity: Not on file  Transportation Needs: Not on file  Physical Activity: Not on file  Stress: Not on file  Social Connections: Not on file  Intimate Partner Violence: Not on file    Outpatient Medications Prior to Visit  Medication Sig Dispense Refill   acetaminophen (TYLENOL) 500 MG tablet Take 500-1,000 mg by mouth every 6 (six) hours as needed for moderate pain.      bisoprolol-hydrochlorothiazide (ZIAC) 5-6.25 MG tablet TAKE 1 TABLET BY MOUTH DAILY. 90 tablet 0   cephALEXin (KEFLEX) 500 MG capsule Take 1 capsule (500 mg total) by mouth 2 (two) times daily. 60 capsule 11   Dextran 70-Hypromellose (ARTIFICIAL TEARS PF OP) Place 1 drop into both eyes as needed (for dry eyes).     losartan (COZAAR) 50 MG tablet TAKE 1/2 TAB BY MOUTH DAILY FOR 8 DAYS, 1 TAB DAILY FOR 8 DAYS, INCREASE TO 2 TABS DAILY ONLY IF BLOOD PRESSURE HIGHER THAN 140/90 CONSISTENTLY 90 tablet 0   Multiple Vitamins-Minerals (MULTIVITAMIN PO) Take 1 tablet by mouth daily.     Multiple Vitamins-Minerals (PRESERVISION AREDS PO) Take 1 capsule by mouth 2 (two) times daily.     rosuvastatin (CRESTOR) 5 MG tablet TAKE 1 TABLET BY MOUTH AT BEDTIME EVERY MONDAY, WEDNESDAY, AND FRIDAY NIGHT. 36 tablet 1   Vitamin D, Ergocalciferol, (DRISDOL) 1.25 MG (50000 UNIT) CAPS capsule TAKE 1 CAPSULE BY MOUTH ON WEDNESDAYS AND SUNDAYS (Patient taking differently: TAKE 1 CAPSULE BY MOUTH ON WEDNESDAYS) 24 capsule 0   No facility-administered medications prior to visit.    Allergies  Allergen Reactions   Penicillins Hives and Itching    Tolerated Ancef 11/17 and cefepime 02/2017  Has patient had a PCN reaction causing immediate rash, facial/tongue/throat swelling, SOB or lightheadedness with hypotension: Yes Has patient had a PCN reaction causing severe rash involving mucus membranes or skin necrosis: No Has patient had a PCN reaction that required hospitalization: No Has patient had a PCN reaction occurring within the last 10 years:  # # YES # #  If all of the above answers are "NO", then may proceed with Cephalosporin use.   Chocolate Other (See Comments)    Migraines    Tape Itching   Ampicillin Hives and Itching    ROS Review of Systems Review of Systems:  A fourteen system review of systems was performed and found to be positive as per HPI.   Objective:    Physical Exam General:  Well  Developed, well nourished, wheelchair bound Neuro:  Alert and oriented,  extra-ocular muscles intact  HEENT:  Normocephalic, atraumatic, neck supple, no carotid bruits appreciated  Skin:  no gross rash, warm, pink. Cardiac:  RRR, S1 S2, +murmur Respiratory:  CTA B/L, Not using accessory muscles, speaking in full sentences- unlabored. Vascular:  Ext warm, no cyanosis apprec.; cap RF less 2 sec. Trace of edema Psych:  No HI/SI, judgement and insight good, Euthymic mood. Full Affect.  BP (!) 150/76   Pulse 70   Temp 98.5 F (36.9 C)   SpO2 97%  Wt Readings from Last 3 Encounters:  01/03/21 190 lb (86.2 kg)  07/13/20 180 lb 12.4 oz (82 kg)  06/17/20 180 lb (81.6 kg)  Health Maintenance Due  Topic Date Due   COVID-19 Vaccine (1) Never done   Zoster Vaccines- Shingrix (1 of 2) Never done   DEXA SCAN  Never done   PNA vac Low Risk Adult (1 of 2 - PCV13) Never done   INFLUENZA VACCINE  Never done    There are no preventive care reminders to display for this patient.  Lab Results  Component Value Date   TSH 2.180 08/15/2019   Lab Results  Component Value Date   WBC 6.6 01/03/2021   HGB 11.5 01/03/2021   HCT 35.7 01/03/2021   MCV 88 01/03/2021   PLT 226 01/03/2021   Lab Results  Component Value Date   NA 142 01/03/2021   K 4.9 01/03/2021   CO2 21 01/03/2021   GLUCOSE 116 (H) 01/03/2021   BUN 30 (H) 01/03/2021   CREATININE 1.12 (H) 01/03/2021   BILITOT 0.3 01/03/2021   ALKPHOS 73 01/03/2021   AST 16 01/03/2021   ALT 11 01/03/2021   PROT 7.2 01/03/2021   ALBUMIN 4.2 01/03/2021   CALCIUM 9.4 01/03/2021   ANIONGAP 12 06/17/2020   EGFR 47 (L) 01/03/2021   Lab Results  Component Value Date   CHOL 157 01/03/2021   Lab Results  Component Value Date   HDL 39 (L) 01/03/2021   Lab Results  Component Value Date   LDLCALC 90 01/03/2021   Lab Results  Component Value Date   TRIG 159 (H) 01/03/2021   Lab Results  Component Value Date   CHOLHDL 4.0 01/03/2021    Lab Results  Component Value Date   HGBA1C 6.6 (H) 01/03/2021      Assessment & Plan:   Problem List Items Addressed This Visit       Cardiovascular and Mediastinum   Hypertensive cardiomegaly - Primary    -Stable, BP in office elevated likely related to white coat syndrome. Ambulatory BP readings at goal. -Continue current medication regimen. -Will continue to monitor.      Relevant Orders   CBC w/Diff   Comp Met (CMET)     Other   Prediabetes    -Last A1c 6.6, will repeat today. -Recommend to continue with low glucose intake and monitor carbohydrates.      Relevant Orders   CBC w/Diff   Comp Met (CMET)   HgB A1c   Mixed hyperlipidemia    -Last lipid panel: total cholesterol 157, triglycerides 159, HDL 39, LDL 90 -Continue current medication regimen. Follow low fat diet. -Will repeat lipid panel and hepatic function today.      Relevant Orders   CBC w/Diff   Comp Met (CMET)   Lipid Profile    No orders of the defined types were placed in this encounter.   Follow-up: Return in about 3 months (around 10/05/2021) for MCW.    Maritza Abonza, PA-C 

## 2021-07-06 NOTE — Assessment & Plan Note (Signed)
-  Stable, BP in office elevated likely related to white coat syndrome. Ambulatory BP readings at goal. -Continue current medication regimen. -Will continue to monitor.

## 2021-07-07 LAB — COMPREHENSIVE METABOLIC PANEL
ALT: 13 IU/L (ref 0–32)
AST: 17 IU/L (ref 0–40)
Albumin/Globulin Ratio: 1.5 (ref 1.2–2.2)
Albumin: 4.4 g/dL (ref 3.6–4.6)
Alkaline Phosphatase: 75 IU/L (ref 44–121)
BUN/Creatinine Ratio: 25 (ref 12–28)
BUN: 25 mg/dL (ref 8–27)
Bilirubin Total: 0.6 mg/dL (ref 0.0–1.2)
CO2: 23 mmol/L (ref 20–29)
Calcium: 9.1 mg/dL (ref 8.7–10.3)
Chloride: 101 mmol/L (ref 96–106)
Creatinine, Ser: 1.01 mg/dL — ABNORMAL HIGH (ref 0.57–1.00)
Globulin, Total: 3 g/dL (ref 1.5–4.5)
Glucose: 104 mg/dL — ABNORMAL HIGH (ref 65–99)
Potassium: 4.5 mmol/L (ref 3.5–5.2)
Sodium: 141 mmol/L (ref 134–144)
Total Protein: 7.4 g/dL (ref 6.0–8.5)
eGFR: 53 mL/min/{1.73_m2} — ABNORMAL LOW (ref >59)

## 2021-07-07 LAB — CBC WITH DIFFERENTIAL/PLATELET
Basophils Absolute: 0.1 10*3/uL (ref 0.0–0.2)
Basos: 1 %
EOS (ABSOLUTE): 0.3 10*3/uL (ref 0.0–0.4)
Eos: 4 %
Hematocrit: 37.6 % (ref 34.0–46.6)
Hemoglobin: 12.2 g/dL (ref 11.1–15.9)
Immature Grans (Abs): 0 10*3/uL (ref 0.0–0.1)
Immature Granulocytes: 0 %
Lymphocytes Absolute: 1.2 10*3/uL (ref 0.7–3.1)
Lymphs: 15 %
MCH: 29.4 pg (ref 26.6–33.0)
MCHC: 32.4 g/dL (ref 31.5–35.7)
MCV: 91 fL (ref 79–97)
Monocytes Absolute: 0.9 10*3/uL (ref 0.1–0.9)
Monocytes: 11 %
Neutrophils Absolute: 5.7 10*3/uL (ref 1.4–7.0)
Neutrophils: 69 %
Platelets: 204 10*3/uL (ref 150–450)
RBC: 4.15 x10E6/uL (ref 3.77–5.28)
RDW: 12.8 % (ref 11.7–15.4)
WBC: 8.2 10*3/uL (ref 3.4–10.8)

## 2021-07-07 LAB — LIPID PANEL
Chol/HDL Ratio: 3.4 ratio (ref 0.0–4.4)
Cholesterol, Total: 135 mg/dL (ref 100–199)
HDL: 40 mg/dL (ref >39)
LDL Chol Calc (NIH): 74 mg/dL (ref 0–99)
Triglycerides: 116 mg/dL (ref 0–149)
VLDL Cholesterol Cal: 21 mg/dL (ref 5–40)

## 2021-07-07 LAB — HEMOGLOBIN A1C
Est. average glucose Bld gHb Est-mCnc: 140 mg/dL
Hgb A1c MFr Bld: 6.5 % — ABNORMAL HIGH (ref 4.8–5.6)

## 2021-07-16 ENCOUNTER — Other Ambulatory Visit: Payer: Self-pay | Admitting: Physician Assistant

## 2021-07-16 DIAGNOSIS — I119 Hypertensive heart disease without heart failure: Secondary | ICD-10-CM

## 2021-10-06 ENCOUNTER — Other Ambulatory Visit: Payer: Self-pay

## 2021-10-06 ENCOUNTER — Encounter: Payer: Self-pay | Admitting: Physician Assistant

## 2021-10-06 ENCOUNTER — Ambulatory Visit (INDEPENDENT_AMBULATORY_CARE_PROVIDER_SITE_OTHER): Payer: Medicare Other | Admitting: Physician Assistant

## 2021-10-06 VITALS — Ht 59.0 in | Wt 190.0 lb

## 2021-10-06 DIAGNOSIS — I119 Hypertensive heart disease without heart failure: Secondary | ICD-10-CM | POA: Diagnosis not present

## 2021-10-06 DIAGNOSIS — E559 Vitamin D deficiency, unspecified: Secondary | ICD-10-CM | POA: Diagnosis not present

## 2021-10-06 DIAGNOSIS — Z0000 Encounter for general adult medical examination without abnormal findings: Secondary | ICD-10-CM | POA: Diagnosis not present

## 2021-10-06 MED ORDER — VITAMIN D (ERGOCALCIFEROL) 1.25 MG (50000 UNIT) PO CAPS: ORAL_CAPSULE | ORAL | 0 refills | Status: DC

## 2021-10-06 MED ORDER — BISOPROLOL-HYDROCHLOROTHIAZIDE 5-6.25 MG PO TABS
1.0000 | ORAL_TABLET | Freq: Every day | ORAL | 0 refills | Status: DC
Start: 2021-10-06 — End: 2022-01-04

## 2021-10-06 MED ORDER — LOSARTAN POTASSIUM 50 MG PO TABS
50.0000 mg | ORAL_TABLET | Freq: Every day | ORAL | 0 refills | Status: DC
Start: 2021-10-06 — End: 2022-01-04

## 2021-10-06 NOTE — Patient Instructions (Signed)
Preventive Care 65 Years and Older, Female °Preventive care refers to lifestyle choices and visits with your health care provider that can promote health and wellness. Preventive care visits are also called wellness exams. °What can I expect for my preventive care visit? °Counseling °Your health care provider may ask you questions about your: °Medical history, including: °Past medical problems. °Family medical history. °Pregnancy and menstrual history. °History of falls. °Current health, including: °Memory and ability to understand (cognition). °Emotional well-being. °Home life and relationship well-being. °Sexual activity and sexual health. °Lifestyle, including: °Alcohol, nicotine or tobacco, and drug use. °Access to firearms. °Diet, exercise, and sleep habits. °Work and work environment. °Sunscreen use. °Safety issues such as seatbelt and bike helmet use. °Physical exam °Your health care provider will check your: °Height and weight. These may be used to calculate your BMI (body mass index). BMI is a measurement that tells if you are at a healthy weight. °Waist circumference. This measures the distance around your waistline. This measurement also tells if you are at a healthy weight and may help predict your risk of certain diseases, such as type 2 diabetes and high blood pressure. °Heart rate and blood pressure. °Body temperature. °Skin for abnormal spots. °What immunizations do I need? °Vaccines are usually given at various ages, according to a schedule. Your health care provider will recommend vaccines for you based on your age, medical history, and lifestyle or other factors, such as travel or where you work. °What tests do I need? °Screening °Your health care provider may recommend screening tests for certain conditions. This may include: °Lipid and cholesterol levels. °Hepatitis C test. °Hepatitis B test. °HIV (human immunodeficiency virus) test. °STI (sexually transmitted infection) testing, if you are at  risk. °Lung cancer screening. °Colorectal cancer screening. °Diabetes screening. This is done by checking your blood sugar (glucose) after you have not eaten for a while (fasting). °Mammogram. Talk with your health care provider about how often you should have regular mammograms. °BRCA-related cancer screening. This may be done if you have a family history of breast, ovarian, tubal, or peritoneal cancers. °Bone density scan. This is done to screen for osteoporosis. °Talk with your health care provider about your test results, treatment options, and if necessary, the need for more tests. °Follow these instructions at home: °Eating and drinking ° °Eat a diet that includes fresh fruits and vegetables, whole grains, lean protein, and low-fat dairy products. Limit your intake of foods with high amounts of sugar, saturated fats, and salt. °Take vitamin and mineral supplements as recommended by your health care provider. °Do not drink alcohol if your health care provider tells you not to drink. °If you drink alcohol: °Limit how much you have to 0-1 drink a day. °Know how much alcohol is in your drink. In the [place] one drink equals one 12 oz bottle of beer (355 mL), one 5 oz glass of wine (148 mL), or one 1½ oz glass of hard liquor (44 mL). °Lifestyle °Brush your teeth every morning and night with fluoride toothpaste. Floss one time each day. °Exercise for at least 30 minutes 5 or more days each week. °Do not use any products that contain nicotine or tobacco. These products include cigarettes, chewing tobacco, and vaping devices, such as e-cigarettes. If you need help quitting, ask your health care provider. °Do not use drugs. °If you are sexually active, practice safe sex. Use a condom or other form of protection in order to prevent STIs. °Take aspirin only as told by your   health care provider. Make sure that you understand how much to take and what form to take. Work with your health care provider to find out whether it  is safe and beneficial for you to take aspirin daily. Ask your health care provider if you need to take a cholesterol-lowering medicine (statin). Find healthy ways to manage stress, such as: Meditation, yoga, or listening to music. Journaling. Talking to a trusted person. Spending time with friends and family. Minimize exposure to UV radiation to reduce your risk of skin cancer. Safety Always wear your seat belt while driving or riding in a vehicle. Do not drive: If you have been drinking alcohol. Do not ride with someone who has been drinking. When you are tired or distracted. While texting. If you have been using any mind-altering substances or drugs. Wear a helmet and other protective equipment during sports activities. If you have firearms in your house, make sure you follow all gun safety procedures. What's next? Visit your health care provider once a year for an annual wellness visit. Ask your health care provider how often you should have your eyes and teeth checked. Stay up to date on all vaccines. This information is not intended to replace advice given to you by your health care provider. Make sure you discuss any questions you have with your health care provider. Document Revised: 04/06/2021 Document Reviewed: 04/06/2021 Elsevier Patient Education  [place].

## 2021-10-06 NOTE — Progress Notes (Addendum)
Subjective:   Dawn Lawson is a 87 y.o. female who presents for Medicare Annual (Subsequent) preventive examination.  Review of Systems    Refer to PCP  I connected with  Charlann Noss on 10/06/21 by an audio only telemedicine application and verified that I am speaking with the correct person using two identifiers.   I discussed the limitations, risks, security and privacy concerns of performing an evaluation and management service by telephone and the availability of in person appointments. I also discussed with the patient that there may be a patient responsible charge related to this service. The patient expressed understanding and verbally consented to this telephonic visit.  Location of Patient: Home Location of Provider: Office   List any persons and their role that are participating in the visit with the patient.    Dewana Ammirati, CMA, Hosie Poisson and Riki Rusk (patients son)       Objective:    Today's Vitals   10/06/21 1012  Weight: 190 lb (86.2 kg)  Height: 4\' 11"  (1.499 m)   Body mass index is 38.38 kg/m.  Advanced Directives 07/13/2020 06/17/2020 07/16/2019 07/16/2019 11/24/2016 09/15/2016 04/09/2014  Does Patient Have a Medical Advance Directive? No No Yes Yes Yes Yes Patient has advance directive, copy not in chart  Type of Advance Directive - - [place];Living will Power;Living will [place];Living will - [place];Living will  Does patient want to make changes to medical advance directive? - - - - No - Patient declined - -  Copy of [place] in Chart? - - No - copy available, Physician notified - No - copy requested - -  Would patient like information on creating a medical advance directive? No - Patient declined - - - - - -  Pre-existing out of facility DNR order (yellow form or pink MOST form) - - - - - - -    Current Medications (verified) Outpatient  Encounter Medications as of 10/06/2021  Medication Sig   acetaminophen (TYLENOL) 500 MG tablet Take 500-1,000 mg by mouth every 6 (six) hours as needed for moderate pain.   bisoprolol-hydrochlorothiazide (ZIAC) 5-6.25 MG tablet TAKE 1 TABLET BY MOUTH DAILY.   cephALEXin (KEFLEX) 500 MG capsule Take 1 capsule (500 mg total) by mouth 2 (two) times daily.   Dextran 70-Hypromellose (ARTIFICIAL TEARS PF OP) Place 1 drop into both eyes as needed (for dry eyes).   losartan (COZAAR) 50 MG tablet TAKE 1/2 TAB BY MOUTH DAILY FOR 8 DAYS, 1 TAB DAILY FOR 8 DAYS, INCREASE TO 2 TABS DAILY ONLY IF BLOOD PRESSURE HIGHER THAN 140/90 CONSISTENTLY   Multiple Vitamins-Minerals (MULTIVITAMIN PO) Take 1 tablet by mouth daily.   Multiple Vitamins-Minerals (PRESERVISION AREDS PO) Take 1 capsule by mouth 2 (two) times daily.   rosuvastatin (CRESTOR) 5 MG tablet TAKE 1 TABLET BY MOUTH AT BEDTIME EVERY MONDAY, WEDNESDAY, AND FRIDAY NIGHT.   Vitamin D, Ergocalciferol, (DRISDOL) 1.25 MG (50000 UNIT) CAPS capsule TAKE 1 CAPSULE BY MOUTH ON WEDNESDAYS AND SUNDAYS (Patient taking differently: TAKE 1 CAPSULE BY MOUTH ON WEDNESDAYS)   No facility-administered encounter medications on file as of 10/06/2021.    Allergies (verified) Penicillins, Chocolate, Tape, and Ampicillin   History: Past Medical History:  Diagnosis Date   AKI (acute kidney injury) ([place]) 03/22/2014   Anemia    Arthritis    ATN (acute tubular necrosis) ([facility]) 03/22/2014   Cataract    Heart murmur  Hypertension    Lactic acidosis 03/22/2014   PONV (postoperative nausea and vomiting)    Pre-diabetes    Sepsis due to group B Streptococcus ([place]) 04/14/2014   Severe sepsis with acute organ dysfunction ([facility]) 03/22/2014   Status post PICC central line placement 04/14/2014   Urinary tract infection 03/22/2014   Past Surgical History:  Procedure Laterality Date   ABDOMINAL HYSTERECTOMY     partial   APPENDECTOMY     APPLICATION OF WOUND VAC Right 09/18/2016    Procedure: APPLICATION OF WOUND VAC;  Surgeon: Rod Can, MD;  Location: [facility];  Service: Orthopedics;  Laterality: Right;   EXCISIONAL TOTAL KNEE ARTHROPLASTY WITH ANTIBIOTIC SPACERS Right 09/18/2016   Procedure: RESECTION OF RIGHT TOTAL KNEE ARTHROPLASTY PLACEMENT OF ANTIBIOTIC SPACER;  Surgeon: Rod Can, MD;  Location: [facility];  Service: Orthopedics;  Laterality: Right;   EYE SURGERY     Lens implants   JOINT REPLACEMENT Right    KNEE   KNEE HARDWARE REMOVAL Right 11/24/2016   RECTOCELE REPAIR     TEE WITHOUT CARDIOVERSION N/A 04/09/2014   Procedure: TRANSESOPHAGEAL ECHOCARDIOGRAM (TEE);  Surgeon: Lelon Perla, MD;  [facility] ENDOSCOPY;  Service: Cardiovascular;  Laterality: N/A;   TOTAL KNEE ARTHROPLASTY WITH REVISION COMPONENTS Right 11/24/2016   Procedure: Removal of right knee spacer, Right knee arthrodesis;  Surgeon: Rod Can, MD;  Location: [facility];  Service: Orthopedics;  Laterality: Right;   Family History  Problem Relation Age of Onset   Heart disease Mother    Cancer Mother        colon   Heart attack Father    Social History   Socioeconomic History   Marital status: Widowed    Spouse name: n/a   Number of children: 3   Years of education: Not on file   Highest education level: Not on file  Occupational History   Occupation: retired Biomedical scientist  Tobacco Use   Smoking status: Former    Packs/day: 1.00    Years: 2.00    Pack years: 2.00    Types: Cigarettes    Quit date: 10/23/1976    Years since quitting: 44.9   Smokeless tobacco: Never  Vaping Use   Vaping Use: Never used  Substance and Sexual Activity   Alcohol use: No    Alcohol/week: 0.0 standard drinks   Drug use: No   Sexual activity: Not Currently    Birth control/protection: None  Other Topics Concern   Not on file  Social History Narrative   Lives alone ("with the ghosts and the spiders.").   Her son lives nearby and helps provide care.   One daughter lives in [place],  the other in [place].   Social Determinants of Health   Financial Resource Strain: Not on file  Food Insecurity: Not on file  Transportation Needs: Not on file  Physical Activity: Not on file  Stress: Not on file  Social Connections: Not on file    Tobacco Counseling Counseling given: Not Answered             Diabetic?No         Activities of Daily Living In your present state of health, do you have any difficulty performing the following activities: 10/06/2021 07/06/2021  Hearing? N N  Vision? N N  Difficulty concentrating or making decisions? N N  Walking or climbing stairs? Y Y  Dressing or bathing? N N  Doing errands, shopping? Y Y  Some recent data might be hidden  Patient Care Team: Lorrene Reid, PA-C as PCP - General Milus Height, MD as Referring Physician (Ophthalmology) Rod Can, MD as Consulting Physician (Orthopedic Surgery)  Indicate any recent Medical Services you may have received from other than [facility] providers in the past year (date may be approximate).     Assessment:   This is a routine wellness examination for [place].  Hearing/Vision screen No results found.  Dietary issues and exercise activities discussed:     Goals Addressed   None   Depression Screen PHQ 2/9 Scores 10/06/2021 07/06/2021 01/03/2021 10/04/2020 08/06/2020 05/31/2020 01/05/2020  PHQ - 2 Score 0 0 0 0 0 0 0  PHQ- 9 Score - 0 0 0 0 0 0    Fall Risk Fall Risk  10/06/2021 07/06/2021 01/03/2021 10/04/2020 08/06/2020  Falls in the past year? 0 0 0 0 0  Comment - - - - -  Number falls in past yr: 0 0 - - -  Injury with Fall? 0 0 - - -  Risk for fall due to : Impaired balance/gait;Impaired mobility No Fall Risks - No Fall Risks -  Follow up Falls evaluation completed Falls evaluation completed Falls evaluation completed Falls evaluation completed Falls evaluation completed    [place]:  Any stairs in or around the home?  Yes  If so, are there any without handrails? Yes  Home free of loose throw rugs in walkways, pet beds, electrical cords, etc? Yes  Adequate lighting in your home to reduce risk of falls? Yes   ASSISTIVE DEVICES UTILIZED TO PREVENT FALLS:  Life alert? Yes  Use of a cane, walker or w/c? Yes  Grab bars in the bathroom? Yes  Shower chair or bench in shower? Yes  Elevated toilet seat or a handicapped toilet? Yes   TIMED UP AND GO:  Was the test performed? No .  NOT IN OFFICE Length of time to ambulate 10 feet:  sec.     Cognitive Function:     6CIT Screen 10/06/2021 10/04/2020  What Year? 0 points 0 points  What month? 0 points 0 points  What time? 0 points 0 points  Count back from 20 2 points 0 points  Months in reverse 2 points 0 points  Repeat phrase 4 points 10 points  Total Score 8 10    Immunizations Immunization History  Administered Date(s) Administered   Tdap 11/24/2016    TDAP status: Up to date  Flu Vaccine status: Due, Education has been provided regarding the importance of this vaccine. Advised may receive this vaccine at local pharmacy or Health Dept. Aware to provide a copy of the vaccination record if obtained from local pharmacy or Health Dept. Verbalized acceptance and understanding.  Pneumococcal vaccine status: Declined,  Education has been provided regarding the importance of this vaccine but patient still declined. Advised may receive this vaccine at local pharmacy or Health Dept. Aware to provide a copy of the vaccination record if obtained from local pharmacy or Health Dept. Verbalized acceptance and understanding.   Covid-19 vaccine status: Declined, Education has been provided regarding the importance of this vaccine but patient still declined. Advised may receive this vaccine at local pharmacy or Health Dept.or vaccine clinic. Aware to provide a copy of the vaccination record if obtained from local pharmacy or Health Dept. Verbalized acceptance and  understanding.  Qualifies for Shingles Vaccine? Yes   Zostavax completed No   Shingrix Completed?: No.    Education has been provided regarding  the importance of this vaccine. Patient has been advised to call insurance company to determine out of pocket expense if they have not yet received this vaccine. Advised may also receive vaccine at local pharmacy or Health Dept. Verbalized acceptance and understanding.  Screening Tests Health Maintenance  Topic Date Due   COVID-19 Vaccine (1) Never done   Pneumonia Vaccine 52+ Years old (1 - PCV) Never done   Zoster Vaccines- Shingrix (1 of 2) Never done   DEXA SCAN  Never done   INFLUENZA VACCINE  Never done   TETANUS/TDAP  11/24/2026   HPV VACCINES  Aged Out    Health Maintenance  Health Maintenance Due  Topic Date Due   COVID-19 Vaccine (1) Never done   Pneumonia Vaccine 59+ Years old (1 - PCV) Never done   Zoster Vaccines- Shingrix (1 of 2) Never done   DEXA SCAN  Never done   INFLUENZA VACCINE  Never done    Colorectal cancer screening: No longer required.   Mammogram status: No longer required due to age.  Dexa: Patient declined  Lung Cancer Screening: (Low Dose CT Chest recommended if Age 44-80 years, 30 pack-year currently smoking OR have quit w/in 15years.) does not qualify.   Lung Cancer Screening Referral:   Additional Screening:  Hepatitis C Screening: does qualify; Patient declined   Vision Screening: Recommended annual ophthalmology exams for early detection of glaucoma and other disorders of the eye. Is the patient up to date with their annual eye exam?  Yes  Who is the provider or what is the name of the office in which the patient attends annual eye exams?  If pt is not established with a provider, would they like to be referred to a provider to establish care? No .   Dental Screening: Recommended annual dental exams for proper oral hygiene  Community Resource Referral / Chronic Care Management: CRR  required this visit?  No   CCM required this visit?  No      Plan:     I have personally reviewed and noted the following in the patients chart:   Medical and social history Use of alcohol, tobacco or illicit drugs  Current medications and supplements including opioid prescriptions.  Functional ability and status Nutritional status Physical activity Advanced directives List of other physicians Hospitalizations, surgeries, and ER visits in previous 12 months Vitals Screenings to include cognitive, depression, and falls Referrals and appointments  In addition, I have reviewed and discussed with patient certain preventive protocols, quality metrics, and best practice recommendations. A written personalized care plan for preventive services as well as general preventive health recommendations were provided to patient.     [place], CMA   10/06/2021   Medicare Wellness done with CMA. Patient and Son declined to speak with provider today and were both happy to have visit completed with me.  I spent 20 Minutes on this Medicare Wellness.

## 2021-11-24 NOTE — Addendum Note (Signed)
Addended by: Mickel Crow on: 11/24/2021 03:40 PM   Modules accepted: Level of Service

## 2021-12-10 ENCOUNTER — Other Ambulatory Visit: Payer: Self-pay | Admitting: Physician Assistant

## 2021-12-10 DIAGNOSIS — E785 Hyperlipidemia, unspecified: Secondary | ICD-10-CM

## 2022-01-03 NOTE — Progress Notes (Signed)
?Established patient visit ? ? ?Patient: Dawn Lawson   DOB: 06-08-1929   88 y.o. Female  MRN: [id number] ?Visit Date: 01/04/2022 ? ?Chief Complaint  ?Patient presents with  ? Follow-up  ?  PreDM  ? Hypertension  ? Hyperlipidemia  ? ?Subjective  ?  ?HPI ?HPI   ? ? Follow-up   ? Additional comments: PreDM ? ?  ?  ?Last edited by Aron Baba, CMA on 01/04/2022 10:00 AM.  ?  ?  ?Patient presents for follow-up on prediabetes, hypertension and hyperlipidemia. Patient and son have no acute concerns.  ? ?Prediabetes: Pt denies increased urination or thirst. No changes with diet, no increased intake of carbohydrates or sugar.  ? ?HTN: Pt denies chest pain, palpitations, dizziness or shortness of breath. Lower extremity edema has been stable. Taking medication as directed without side effects. Patient's son continues to check BP at home and readings range 120-130/70-80. ? ?HLD: Pt taking medication as directed without issues. 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  ? ? ?Medications: ?Outpatient Medications Prior to Visit  ?Medication Sig  ? acetaminophen (TYLENOL) 500 MG tablet Take 500-1,000 mg by mouth every 6 (six) hours as needed for moderate pain.  ? cephALEXin (KEFLEX) 500 MG capsule Take 1 capsule (500 mg total) by mouth 2 (two) times daily.  ? Dextran 70-Hypromellose (ARTIFICIAL TEARS PF OP) Place 1 drop into both eyes as needed (for dry eyes).  ? Multiple Vitamins-Minerals (MULTIVITAMIN PO) Take 1 tablet by mouth daily.  ? Multiple Vitamins-Minerals (PRESERVISION AREDS PO) Take 1 capsule by mouth 2 (two) times daily.  ? rosuvastatin (CRESTOR) 5 MG tablet TAKE 1 TABLET BY MOUTH AT BEDTIME EVERY MONDAY, WEDNESDAY, AND FRIDAY NIGHT.  ? Vitamin D, Ergocalciferol, (DRISDOL) 1.25 MG (50000 UNIT) CAPS capsule TAKE 1 CAPSULE BY MOUTH ON WEDNESDAYS  ? [DISCONTINUED] bisoprolol-hydrochlorothiazide (ZIAC) 5-6.25 MG tablet Take 1 tablet by mouth daily.  ? [DISCONTINUED] losartan (COZAAR) 50 MG tablet Take 1 tablet (50  mg total) by mouth daily.  ? ?No facility-administered medications prior to visit.  ? ? ?Review of Systems ?Review of Systems:  ?A fourteen system review of systems was performed and found to be positive as per HPI. ? ? ?  Objective  ?  ?BP 118/70   Pulse 74   Temp 97.6 ?F (36.4 ?C)   Wt 190 lb (86.2 kg)   SpO2 98%   BMI 38.38 kg/m?  ?BP Readings from Last 3 Encounters:  ?01/04/22 118/70  ?07/06/21 (!) 150/76  ?01/03/21 (!) 173/63  ? ?Wt Readings from Last 3 Encounters:  ?01/04/22 190 lb (86.2 kg)  ?10/06/21 190 lb (86.2 kg)  ?01/03/21 190 lb (86.2 kg)  ? ? ?Physical Exam  ?General:  Pleasant and cooperative, in no acute distress  ?Neuro:  Alert and oriented,  extra-ocular muscles intact  ?HEENT:  Normocephalic, atraumatic, neck supple  ?Skin:  no gross rash, warm, pink. ?Cardiac:  RRR, S1 S2, +murmur  ?Respiratory: CTA B/L  ?Vascular:  Ext warm, no cyanosis apprec.; cap RF less 2 sec. Chronic edema b/l ?Psych:  No HI/SI, judgement and insight good, Euthymic mood. Full Affect. ? ? ?No results found for any visits on 01/04/22. ? Assessment & Plan  ?  ? ? ?Problem List Items Addressed This Visit   ? ?  ? Cardiovascular and Mediastinum  ? Hypertensive cardiomegaly - Primary  ?  -BP elevated on intake, BP repeated and improved. Stable. Continue current medication regimen. Will collect CMP for medication monitoring. Continue ambulatory  BP monitoring. ?  ?  ? Relevant Medications  ? bisoprolol-hydrochlorothiazide (ZIAC) 5-6.25 MG tablet  ? losartan (COZAAR) 50 MG tablet  ? Other Relevant Orders  ? Comp Met (CMET)  ?  ? Other  ? Anemia (Chronic)  ?  -Last CBC wnl's. Will repeat CBC to continue to monitor. Pt asymptomatic.  ?  ?  ? Relevant Orders  ? CBC w/Diff  ? Comp Met (CMET)  ? Vitamin D deficiency  ?  -Last Vitamin D 64.5, will repeat Vitamin D. Continue Vit D supplement. Pending results will make treatment adjustments.  ?  ?  ? Relevant Orders  ? Vitamin D (25 hydroxy)  ? Prediabetes  ?  -Last A1c 6.5, will  repeat A1c today. Will continue to monitor. ?  ?  ? Relevant Orders  ? HgB A1c  ? Mixed hyperlipidemia  ?  -Last lipid panel wnl's, LDL 74. ?-Continue current medication regimen. See med list. ?-Will repeat lipid panel and hepatic function. ?-Will continue to monitor. ?  ?  ? Relevant Medications  ? bisoprolol-hydrochlorothiazide (ZIAC) 5-6.25 MG tablet  ? losartan (COZAAR) 50 MG tablet  ? Other Relevant Orders  ? Lipid Profile  ? ? ?Return in about 6 months (around 07/07/2022) for HTN, HLD, prediabetes (ok for telehealth).  ?   ? ? ? ?Lorrene Reid, PA-C  ?[facility] ?[phone] (phone) ?[phone] (fax) ? ?[facility] ?

## 2022-01-04 ENCOUNTER — Other Ambulatory Visit: Payer: Self-pay

## 2022-01-04 ENCOUNTER — Ambulatory Visit (INDEPENDENT_AMBULATORY_CARE_PROVIDER_SITE_OTHER): Payer: Medicare Other | Admitting: Physician Assistant

## 2022-01-04 ENCOUNTER — Encounter: Payer: Self-pay | Admitting: Physician Assistant

## 2022-01-04 VITALS — BP 118/70 | HR 74 | Temp 97.6°F | Wt 190.0 lb

## 2022-01-04 DIAGNOSIS — E782 Mixed hyperlipidemia: Secondary | ICD-10-CM

## 2022-01-04 DIAGNOSIS — E559 Vitamin D deficiency, unspecified: Secondary | ICD-10-CM

## 2022-01-04 DIAGNOSIS — I119 Hypertensive heart disease without heart failure: Secondary | ICD-10-CM

## 2022-01-04 DIAGNOSIS — D649 Anemia, unspecified: Secondary | ICD-10-CM

## 2022-01-04 DIAGNOSIS — E785 Hyperlipidemia, unspecified: Secondary | ICD-10-CM | POA: Diagnosis not present

## 2022-01-04 DIAGNOSIS — R7303 Prediabetes: Secondary | ICD-10-CM

## 2022-01-04 MED ORDER — LOSARTAN POTASSIUM 50 MG PO TABS: 50.0000 mg | ORAL_TABLET | Freq: Every day | ORAL | 1 refills | Status: DC

## 2022-01-04 MED ORDER — BISOPROLOL-HYDROCHLOROTHIAZIDE 5-6.25 MG PO TABS: 1.0000 | ORAL_TABLET | Freq: Every day | ORAL | 1 refills | Status: DC

## 2022-01-04 NOTE — Assessment & Plan Note (Signed)
-  Last CBC wnl's. Will repeat CBC to continue to monitor. Pt asymptomatic.  ?

## 2022-01-04 NOTE — Assessment & Plan Note (Signed)
-  Last Vitamin D 64.5, will repeat Vitamin D. Continue Vit D supplement. Pending results will make treatment adjustments.  ?

## 2022-01-04 NOTE — Assessment & Plan Note (Signed)
-  BP elevated on intake, BP repeated and improved. Stable. Continue current medication regimen. Will collect CMP for medication monitoring. Continue ambulatory BP monitoring. ?

## 2022-01-04 NOTE — Assessment & Plan Note (Signed)
-  Last lipid panel wnl's, LDL 74. ?-Continue current medication regimen. See med list. ?-Will repeat lipid panel and hepatic function. ?-Will continue to monitor. ?

## 2022-01-04 NOTE — Patient Instructions (Signed)
Vitamin D Deficiency ?Vitamin D deficiency is when your body does not have enough vitamin D. Vitamin D is important to your body for many reasons: ?It helps the body absorb two important minerals--calcium and phosphorus. ?It plays a role in bone health. ?It may help to prevent some diseases, such as diabetes and multiple sclerosis. ?It plays a role in muscle function, including heart function. ?If vitamin D deficiency is severe, it can cause a condition in which your bones become soft. In adults, this condition is called osteomalacia. In children, this condition is called rickets. ?What are the causes? ?This condition may be caused by: ?Not eating enough foods that contain vitamin D. ?Not getting enough natural sun exposure. ?Having certain digestive system diseases that make it difficult for your body to absorb vitamin D. These diseases include Crohn's disease, chronic pancreatitis, and cystic fibrosis. ?Having a surgery in which a part of the stomach or a part of the small intestine is removed. ?Having chronic kidney disease or liver disease. ?What increases the risk? ?You are more likely to develop this condition if you: ?Are older. ?Do not spend much time outdoors. ?Live in a long-term care facility. ?Have had broken bones. ?Have weak or thin bones (osteoporosis). ?Have a disease or condition that changes how the body absorbs vitamin D. ?Have dark skin. ?Take certain medicines, such as steroid medicines or certain seizure medicines. ?Are overweight or obese. ?What are the signs or symptoms? ?In mild cases of vitamin D deficiency, there may not be any symptoms. If the condition is severe, symptoms may include: ?Bone pain. ?Muscle pain. ?Falling often. ?Broken bones caused by a minor injury. ?How is this diagnosed? ?This condition may be diagnosed with blood tests. Imaging tests such as X-rays may also be done to look for changes in the bone. ?How is this treated? ?Treatment for this condition may depend on what  caused the condition. Treatment options include: ?Taking vitamin D supplements. Your health care provider will suggest what dose is best for you. ?Taking a calcium supplement. Your health care provider will suggest what dose is best for you. ?Follow these instructions at home: ?Eating and drinking ? ?Eat foods that contain vitamin D. Choices include: ?Fortified dairy products, cereals, or juices. Fortified means that vitamin D has been added to the food. Check the label on the package to see if the food is fortified. ?Fatty fish, such as salmon or trout. ?Eggs. ?Oysters. ?Mushrooms. ?The items listed above may not be a complete list of recommended foods and beverages. Contact a dietitian for more information. ?General instructions ?Take medicines and supplements only as told by your health care provider. ?Get regular, safe exposure to natural sunlight. ?Do not use a tanning bed. ?Maintain a healthy weight. Lose weight if needed. ?Keep all follow-up visits as told by your health care provider. This is important. ?How is this prevented? ?You can get vitamin D by: ?Eating foods that naturally contain vitamin D. ?Eating or drinking products that have been fortified with vitamin D, such as cereals, juices, and dairy products (including milk). ?Taking a vitamin D supplement or a multivitamin supplement that contains vitamin D. ?Being in the sun. Your body naturally makes vitamin D when your skin is exposed to sunlight. Your body changes the sunlight into a form of the vitamin that it can use. ?Contact a health care provider if: ?Your symptoms do not go away. ?You feel nauseous or you vomit. ?You have fewer bowel movements than usual or are constipated. ?Summary ?Vitamin  D deficiency is when your body does not have enough vitamin D. ?Vitamin D is important to your body for good bone health and muscle function, and it may help prevent some diseases. ?Vitamin D deficiency is primarily treated through supplementation. Your  health care provider will suggest what dose is best for you. ?You can get vitamin D by eating foods that contain vitamin D, by being in the sun, and by taking a vitamin D supplement or a multivitamin supplement that contains vitamin D. ?This information is not intended to replace advice given to you by your health care provider. Make sure you discuss any questions you have with your health care provider. ?Document Revised: 06/17/2018 Document Reviewed: 06/17/2018 ?Elsevier Patient Education ? [place]. ? ?

## 2022-01-04 NOTE — Assessment & Plan Note (Signed)
-  Last A1c 6.5, will repeat A1c today. Will continue to monitor. ?

## 2022-01-05 LAB — CBC WITH DIFFERENTIAL/PLATELET
Basophils Absolute: 0.1 10*3/uL (ref 0.0–0.2)
Basos: 1 %
EOS (ABSOLUTE): 0.3 10*3/uL (ref 0.0–0.4)
Eos: 4 %
Hematocrit: 37.7 % (ref 34.0–46.6)
Hemoglobin: 12.9 g/dL (ref 11.1–15.9)
Immature Grans (Abs): 0 10*3/uL (ref 0.0–0.1)
Immature Granulocytes: 0 %
Lymphocytes Absolute: 1 10*3/uL (ref 0.7–3.1)
Lymphs: 13 %
MCH: 30.1 pg (ref 26.6–33.0)
MCHC: 34.2 g/dL (ref 31.5–35.7)
MCV: 88 fL (ref 79–97)
Monocytes Absolute: 0.7 10*3/uL (ref 0.1–0.9)
Monocytes: 9 %
Neutrophils Absolute: 5.7 10*3/uL (ref 1.4–7.0)
Neutrophils: 73 %
Platelets: 209 10*3/uL (ref 150–450)
RBC: 4.29 x10E6/uL (ref 3.77–5.28)
RDW: 13.5 % (ref 11.7–15.4)
WBC: 7.7 10*3/uL (ref 3.4–10.8)

## 2022-01-05 LAB — COMPREHENSIVE METABOLIC PANEL
ALT: 14 IU/L (ref 0–32)
AST: 20 IU/L (ref 0–40)
Albumin/Globulin Ratio: 1.5 (ref 1.2–2.2)
Albumin: 4.1 g/dL (ref 3.5–4.6)
Alkaline Phosphatase: 78 IU/L (ref 44–121)
BUN/Creatinine Ratio: 26 (ref 12–28)
BUN: 24 mg/dL (ref 10–36)
Bilirubin Total: 0.6 mg/dL (ref 0.0–1.2)
CO2: 23 mmol/L (ref 20–29)
Calcium: 9.5 mg/dL (ref 8.7–10.3)
Chloride: 101 mmol/L (ref 96–106)
Creatinine, Ser: 0.92 mg/dL (ref 0.57–1.00)
Globulin, Total: 2.8 g/dL (ref 1.5–4.5)
Glucose: 112 mg/dL — ABNORMAL HIGH (ref 70–99)
Potassium: 4.4 mmol/L (ref 3.5–5.2)
Sodium: 141 mmol/L (ref 134–144)
Total Protein: 6.9 g/dL (ref 6.0–8.5)
eGFR: 59 mL/min/{1.73_m2} — ABNORMAL LOW (ref >59)

## 2022-01-05 LAB — HEMOGLOBIN A1C
Est. average glucose Bld gHb Est-mCnc: 134 mg/dL
Hgb A1c MFr Bld: 6.3 % — ABNORMAL HIGH (ref 4.8–5.6)

## 2022-01-05 LAB — LIPID PANEL
Chol/HDL Ratio: 3.6 ratio (ref 0.0–4.4)
Cholesterol, Total: 137 mg/dL (ref 100–199)
HDL: 38 mg/dL — ABNORMAL LOW (ref >39)
LDL Chol Calc (NIH): 81 mg/dL (ref 0–99)
Triglycerides: 94 mg/dL (ref 0–149)
VLDL Cholesterol Cal: 18 mg/dL (ref 5–40)

## 2022-01-05 LAB — VITAMIN D 25 HYDROXY (VIT D DEFICIENCY, FRACTURES): Vit D, 25-Hydroxy: 58.4 ng/mL (ref 30.0–100.0)

## 2022-04-03 ENCOUNTER — Other Ambulatory Visit: Payer: Self-pay | Admitting: Physician Assistant

## 2022-04-03 DIAGNOSIS — E559 Vitamin D deficiency, unspecified: Secondary | ICD-10-CM

## 2022-06-17 ENCOUNTER — Other Ambulatory Visit: Payer: Self-pay | Admitting: Physician Assistant

## 2022-06-17 DIAGNOSIS — E785 Hyperlipidemia, unspecified: Secondary | ICD-10-CM

## 2022-07-11 ENCOUNTER — Ambulatory Visit (INDEPENDENT_AMBULATORY_CARE_PROVIDER_SITE_OTHER): Payer: Medicare Other | Admitting: Physician Assistant

## 2022-07-11 ENCOUNTER — Encounter: Payer: Self-pay | Admitting: Physician Assistant

## 2022-07-11 DIAGNOSIS — E782 Mixed hyperlipidemia: Secondary | ICD-10-CM

## 2022-07-11 DIAGNOSIS — R7303 Prediabetes: Secondary | ICD-10-CM | POA: Diagnosis not present

## 2022-07-11 DIAGNOSIS — Z981 Arthrodesis status: Secondary | ICD-10-CM

## 2022-07-11 DIAGNOSIS — I119 Hypertensive heart disease without heart failure: Secondary | ICD-10-CM | POA: Diagnosis not present

## 2022-07-11 MED ORDER — LOSARTAN POTASSIUM 50 MG PO TABS: 50.0000 mg | ORAL_TABLET | Freq: Every day | ORAL | 1 refills | Status: DC

## 2022-07-11 MED ORDER — BISOPROLOL-HYDROCHLOROTHIAZIDE 5-6.25 MG PO TABS: 1.0000 | ORAL_TABLET | Freq: Every day | ORAL | 1 refills | Status: DC

## 2022-07-11 NOTE — Patient Instructions (Signed)

## 2022-07-11 NOTE — Progress Notes (Signed)
Telehealth office visit note for Dawn Reid, PA-C- at Primary Care at [facility]   I connected with current patient today by telephone and verified that I am speaking with the correct person    Location of the patient: Home  Location of the provider: Office - This visit type was conducted due to national recommendations for restrictions regarding the COVID-19 Pandemic (e.g. social distancing) in an effort to limit this patient's exposure and mitigate transmission in our community.    - No physical exam could be performed with this format, beyond that communicated to [place] by the patient/ family members as noted.   - Additionally my office staff/ schedulers were to discuss with the patient that there may be a monetary charge related to this service, depending on their medical insurance.  My understanding is that patient understood and consented to proceed.     _________________________________________________________________________________   History of Present Illness: Patient calls in for a chronic follow-up visit. Patient' son is her caregiver and is also on the line. Patient is inquiring about managing Keflex which is currently managed by Orthopedics- Dr. Lyla Glassing for hx of arthrodesis with prior infection and inflammation. Patient is wheelchair bound and transportation can be challenging.  HTN: Pt denies chest pain, palpitations, dizziness or syncope. Reports lower extremity swelling is better. Taking medication as directed without side effects. Checks BP at home and average readings range 125/54 with heart reate 72 bpm.   HLD: Pt taking medication as directed without issues. No myalgias.   Prediabetes: No increased thirst, hunger or urination. Denies increased intake of carbohydrates or sugar.       01/04/2022   10:02 AM 07/06/2021    8:15 AM  GAD 7 : Generalized Anxiety Score  Nervous, Anxious, on Edge 0 0  Control/stop worrying 0 0  Worry too much - different things 0  0  Trouble relaxing 0 0  Restless 0 0  Easily annoyed or irritable 0 0  Afraid - awful might happen 0 0  Total GAD 7 Score 0 0  Anxiety Difficulty Not difficult at all        01/04/2022   10:02 AM 10/06/2021   10:23 AM 10/06/2021   10:14 AM 07/06/2021    8:15 AM 01/03/2021    8:21 AM  Depression screen PHQ 2/9  Decreased Interest 0 0 0 0 0  Down, Depressed, Hopeless 0 0 0 0   PHQ - 2 Score 0 0 0 0 0  Altered sleeping 0   0 0  Tired, decreased energy 0   0 0  Change in appetite 0   0 0  Feeling bad or failure about yourself  0   0 0  Trouble concentrating 0   0 0  Moving slowly or fidgety/restless 0   0 0  Suicidal thoughts 0   0 0  PHQ-9 Score 0   0 0  Difficult doing work/chores Not difficult at all          Impression and Recommendations:     1. Hypertensive cardiomegaly   2. Prediabetes   3. Mixed hyperlipidemia   4. History of arthrodesis     Hypertensive cardiomegaly: -Stable. Continue Ziac 5-6.25 mg and Losartan 50 mg daily. Provided refills. Continue ambulatory BP monitoring.   Prediabetes: -Last A1c stable at 6.3. Recommend to follow a low carbohydrate and glucose diet. Recommend repeating A1c with lab visit for Annual Medicare Wellness.   Mixed hyperlipidemia: -Last lipid panel:  HDL 38, LDL 81 -Continue current medication regimen, see med list. Recommend to follow a heart healthy diet and repeating lipid panel/hepatic function with annual medicare wellness.   History of arthrodesis: -Discussed with family as long as patient remains stable with medication therapy PCP can manage prophylactic antibiotic therapy. If there are any changes then recommend to follow-up with [facility].    - As part of my medical decision making, I reviewed the following data within the [place] History obtained from pt /family, CMA notes reviewed and incorporated if applicable, Labs reviewed, Radiograph/ tests reviewed if applicable and OV notes from prior  OV's with me, as well as any other specialists she/he has seen since seeing me last, were all reviewed and used in my medical decision making process today.    - Additionally, when appropriate, discussion had with patient regarding our treatment plan, and their biases/concerns about that plan were used in my medical decision making today.    - The patient agreed with the plan and demonstrated an understanding of the instructions.   No barriers to understanding were identified.     - The patient was advised to call back or seek an in-person evaluation if the symptoms worsen or if the condition fails to improve as anticipated.   Return in about 3 months (around 10/10/2022) for Medicare Wellness and FBW including Vit D few days prior.    No orders of the defined types were placed in this encounter.   Meds ordered this encounter  Medications   bisoprolol-hydrochlorothiazide (ZIAC) 5-6.25 MG tablet    Sig: Take 1 tablet by mouth daily.    Dispense:  90 tablet    Refill:  1    Order Specific Question:   Supervising Provider    Answer:   Beatrice Lecher D [[id number]]   losartan (COZAAR) 50 MG tablet    Sig: Take 1 tablet (50 mg total) by mouth daily.    Dispense:  90 tablet    Refill:  1    Order Specific Question:   Supervising Provider    Answer:   Beatrice Lecher D [[id number]]    Medications Discontinued During This Encounter  Medication Reason   bisoprolol-hydrochlorothiazide (ZIAC) 5-6.25 MG tablet Reorder   losartan (COZAAR) 50 MG tablet Reorder       Time spent on telephone encounter was 6 minutes.      The [place] was signed into law in 2016 which includes the topic of electronic health records.  This provides immediate access to information in MyChart.  This includes consultation notes, operative notes, office notes, lab results and pathology reports.  If you have any questions about what you read please let us know at your next visit or call us at the  office.  We are right here with you.   __________________________________________________________________________________     Patient Care Team    Relationship Specialty Notifications Start End  Dawn Lawson, [place] PCP - General   02/22/20   Milus Height, MD Referring Physician Ophthalmology  10/14/14   Rod Can, MD Consulting Physician Orthopedic Surgery  07/16/19      -Vitals obtained; medications/ allergies reconciled;  personal medical, social, Sx etc.histories were updated by CMA, reviewed by me and are reflected in chart   Patient Active Problem List   Diagnosis Date Noted   Prediabetes 08/19/2019   Mixed hyperlipidemia 08/19/2019   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   h/o Osteomyelitis ([place])- R leg-  07/16/2019   Class  3 severe obesity with serious comorbidity in adult [facility]) 07/16/2019   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 07/16/2019   Family history of colon cancer in mother- died age 43; patient refuses colonoscopy 07/16/2019   Colonoscopy refused 07/16/2019   Vaccine refused by patient- flu and others 07/16/2019   Medically noncompliant 07/16/2019   Hypertensive crisis 07/16/2019   Hypertensive disorder 12/04/2017   Abscess of right thigh 02/22/2017   Macular degeneration 10/14/2014   Vitamin D deficiency 10/14/2014   HTN (hypertension) 10/14/2014   Anemia 10/14/2014   Arthritis 10/14/2014     Current Meds  Medication Sig   acetaminophen (TYLENOL) 500 MG tablet Take 500-1,000 mg by mouth every 6 (six) hours as needed for moderate pain.   cephALEXin (KEFLEX) 500 MG capsule Take 1 capsule (500 mg total) by mouth 2 (two) times daily.   Dextran 70-Hypromellose (ARTIFICIAL TEARS PF OP) Place 1 drop into both eyes as needed (for dry eyes).   Multiple Vitamins-Minerals (MULTIVITAMIN PO) Take 1 tablet by mouth daily.   Multiple Vitamins-Minerals (PRESERVISION  AREDS PO) Take 1 capsule by mouth 2 (two) times daily.   rosuvastatin (CRESTOR) 5 MG tablet TAKE 1 TABLET BY MOUTH AT BEDTIME EVERY MONDAY, WEDNESDAY, AND FRIDAY NIGHT.   Vitamin D, Ergocalciferol, (DRISDOL) 1.25 MG (50000 UNIT) CAPS capsule TAKE 1 CAPSULE BY MOUTH ON WEDNESDAYS   [DISCONTINUED] bisoprolol-hydrochlorothiazide (ZIAC) 5-6.25 MG tablet Take 1 tablet by mouth daily.   [DISCONTINUED] losartan (COZAAR) 50 MG tablet Take 1 tablet (50 mg total) by mouth daily.     Allergies:  Allergies  Allergen Reactions   Penicillins Hives and Itching    Tolerated Ancef 11/17 and cefepime 02/2017  Has patient had a PCN reaction causing immediate rash, facial/tongue/throat swelling, SOB or lightheadedness with hypotension: Yes Has patient had a PCN reaction causing severe rash involving mucus membranes or skin necrosis: No Has patient had a PCN reaction that required hospitalization: No Has patient had a PCN reaction occurring within the last 10 years:  # # YES # #  If all of the above answers are "NO", then may proceed with Cephalosporin use.   Chocolate Other (See Comments)    Migraines    Tape Itching   Ampicillin Hives and Itching     ROS:  See above HPI for pertinent positives and negatives   Objective:   There were no vitals taken for this visit.  (if some vitals are omitted, this means that patient was UNABLE to obtain them. ) General: A & O * 3; sounds in no acute distress Respiratory: speaking in full sentences, no conversational dyspnea Psych: insight appears good, mood- appears full

## 2022-07-25 ENCOUNTER — Other Ambulatory Visit: Payer: Self-pay

## 2022-07-25 ENCOUNTER — Telehealth: Payer: Self-pay

## 2022-07-25 DIAGNOSIS — M868X6 Other osteomyelitis, lower leg: Secondary | ICD-10-CM

## 2022-07-25 MED ORDER — CEPHALEXIN 500 MG PO CAPS: 500.0000 mg | ORAL_CAPSULE | Freq: Two times a day (BID) | ORAL | 11 refills | Status: DC

## 2022-07-25 NOTE — Telephone Encounter (Signed)
Refill request sent notified patient's son Kasandra Knudsen.

## 2022-07-25 NOTE — Telephone Encounter (Signed)
Patient's son called office to get patient a refill on her cephALEXin, patient will be out by the end of the week, thanks!

## 2022-09-02 ENCOUNTER — Other Ambulatory Visit: Payer: Self-pay | Admitting: Physician Assistant

## 2022-09-02 DIAGNOSIS — E559 Vitamin D deficiency, unspecified: Secondary | ICD-10-CM

## 2022-09-04 NOTE — Telephone Encounter (Signed)
Vitamin D levels will be rechecked during next office visit.

## 2022-09-26 ENCOUNTER — Other Ambulatory Visit: Payer: Self-pay

## 2022-09-26 DIAGNOSIS — E559 Vitamin D deficiency, unspecified: Secondary | ICD-10-CM

## 2022-09-26 DIAGNOSIS — I119 Hypertensive heart disease without heart failure: Secondary | ICD-10-CM

## 2022-09-26 DIAGNOSIS — E782 Mixed hyperlipidemia: Secondary | ICD-10-CM

## 2022-09-26 DIAGNOSIS — R7303 Prediabetes: Secondary | ICD-10-CM

## 2022-09-26 DIAGNOSIS — D649 Anemia, unspecified: Secondary | ICD-10-CM

## 2022-10-02 ENCOUNTER — Other Ambulatory Visit: Payer: Medicare Other

## 2022-10-02 DIAGNOSIS — E782 Mixed hyperlipidemia: Secondary | ICD-10-CM

## 2022-10-02 DIAGNOSIS — D649 Anemia, unspecified: Secondary | ICD-10-CM | POA: Diagnosis not present

## 2022-10-02 DIAGNOSIS — R7303 Prediabetes: Secondary | ICD-10-CM | POA: Diagnosis not present

## 2022-10-02 DIAGNOSIS — I119 Hypertensive heart disease without heart failure: Secondary | ICD-10-CM

## 2022-10-02 DIAGNOSIS — E559 Vitamin D deficiency, unspecified: Secondary | ICD-10-CM | POA: Diagnosis not present

## 2022-10-03 LAB — COMPREHENSIVE METABOLIC PANEL
ALT: 15 IU/L (ref 0–32)
AST: 19 IU/L (ref 0–40)
Albumin/Globulin Ratio: 1.4 (ref 1.2–2.2)
Albumin: 3.9 g/dL (ref 3.6–4.6)
Alkaline Phosphatase: 71 IU/L (ref 44–121)
BUN/Creatinine Ratio: 23 (ref 12–28)
BUN: 25 mg/dL (ref 10–36)
Bilirubin Total: 0.6 mg/dL (ref 0.0–1.2)
CO2: 23 mmol/L (ref 20–29)
Calcium: 9 mg/dL (ref 8.7–10.3)
Chloride: 101 mmol/L (ref 96–106)
Creatinine, Ser: 1.1 mg/dL — ABNORMAL HIGH (ref 0.57–1.00)
Globulin, Total: 2.8 g/dL (ref 1.5–4.5)
Glucose: 130 mg/dL — ABNORMAL HIGH (ref 70–99)
Potassium: 4.2 mmol/L (ref 3.5–5.2)
Sodium: 137 mmol/L (ref 134–144)
Total Protein: 6.7 g/dL (ref 6.0–8.5)
eGFR: 47 mL/min/{1.73_m2} — ABNORMAL LOW (ref >59)

## 2022-10-03 LAB — LIPID PANEL
Chol/HDL Ratio: 3.1 ratio (ref 0.0–4.4)
Cholesterol, Total: 99 mg/dL — ABNORMAL LOW (ref 100–199)
HDL: 32 mg/dL — ABNORMAL LOW (ref >39)
LDL Chol Calc (NIH): 51 mg/dL (ref 0–99)
Triglycerides: 78 mg/dL (ref 0–149)
VLDL Cholesterol Cal: 16 mg/dL (ref 5–40)

## 2022-10-03 LAB — CBC
Hematocrit: 33.2 % — ABNORMAL LOW (ref 34.0–46.6)
Hemoglobin: 11.1 g/dL (ref 11.1–15.9)
MCH: 29.5 pg (ref 26.6–33.0)
MCHC: 33.4 g/dL (ref 31.5–35.7)
MCV: 88 fL (ref 79–97)
Platelets: 219 10*3/uL (ref 150–450)
RBC: 3.76 x10E6/uL — ABNORMAL LOW (ref 3.77–5.28)
RDW: 13 % (ref 11.7–15.4)
WBC: 7.2 10*3/uL (ref 3.4–10.8)

## 2022-10-03 LAB — HEMOGLOBIN A1C
Est. average glucose Bld gHb Est-mCnc: 128 mg/dL
Hgb A1c MFr Bld: 6.1 % — ABNORMAL HIGH (ref 4.8–5.6)

## 2022-10-03 LAB — VITAMIN D 25 HYDROXY (VIT D DEFICIENCY, FRACTURES): Vit D, 25-Hydroxy: 64.1 ng/mL (ref 30.0–100.0)

## 2022-10-12 ENCOUNTER — Encounter: Payer: Self-pay | Admitting: Nurse Practitioner

## 2022-10-12 ENCOUNTER — Ambulatory Visit (INDEPENDENT_AMBULATORY_CARE_PROVIDER_SITE_OTHER): Payer: Medicare Other | Admitting: Nurse Practitioner

## 2022-10-12 VITALS — BP 125/57 | HR 79 | Ht 60.0 in | Wt 190.0 lb

## 2022-10-12 DIAGNOSIS — M868X6 Other osteomyelitis, lower leg: Secondary | ICD-10-CM | POA: Diagnosis not present

## 2022-10-12 DIAGNOSIS — Z7409 Other reduced mobility: Secondary | ICD-10-CM

## 2022-10-12 DIAGNOSIS — I119 Hypertensive heart disease without heart failure: Secondary | ICD-10-CM | POA: Diagnosis not present

## 2022-10-12 DIAGNOSIS — Z0000 Encounter for general adult medical examination without abnormal findings: Secondary | ICD-10-CM

## 2022-10-12 NOTE — Progress Notes (Signed)
Subjective:   Dawn Lawson is a 88 y.o. female who presents for Medicare Annual (Subsequent) preventive examination. -history of hypertension and cardiomegaly  -recent fasting labs drawn  --mildly decreased HDL. Remaining lipid panel good  --HgbA1c 6.1 with glucose 130 --mildly decreased renal functions  --normal vitamin d  -she does have chronic skin infection to right foot and lower leg. She is followed by infectious disease for this. She does take keflex everyday,   -she declines bone density test and flu shot.   denies chest pain, chest pressure, or shortness of breath. She denies headaches or visual disturbances. She denies abdominal pain, nausea, vomiting, or changes in bowel or bladder habits.    Review of Systems    See HPI       Objective:    Today's Vitals   10/12/22 0817  BP: (Abnormal) 125/57  Pulse: 79  SpO2: 99%  Weight: 190 lb (86.2 kg)  Height: 5' (1.524 m)   Body mass index is 37.11 kg/m.    Row Labels 07/13/2020   12:18 AM 06/17/2020    7:03 PM 07/16/2019    7:08 PM 07/16/2019    3:24 PM 11/24/2016    2:01 PM 09/15/2016   11:04 AM 04/09/2014    8:34 AM  Advanced Directives   Section Header. No data exists in this row.         Does Patient Have a Medical Advance Directive?   No No Yes Yes Yes Yes Patient has advance directive, copy not in chart  Type of Advance Directive     [place];Living will [place];Living will [place];Living will  [place];Living will  Does patient want to make changes to medical advance directive?       No - Patient declined    Copy of [place] in Chart?     No - copy available, Physician notified  No - copy requested    Would patient like information on creating a medical advance directive?   No - Patient declined          Current Medications (verified) Outpatient Encounter Medications as of 10/12/2022  Medication Sig    acetaminophen (TYLENOL) 500 MG tablet Take 500-1,000 mg by mouth every 6 (six) hours as needed for moderate pain.   bisoprolol-hydrochlorothiazide (ZIAC) 5-6.25 MG tablet Take 1 tablet by mouth daily.   cephALEXin (KEFLEX) 500 MG capsule Take 1 capsule (500 mg total) by mouth 2 (two) times daily.   Dextran 70-Hypromellose (ARTIFICIAL TEARS PF OP) Place 1 drop into both eyes as needed (for dry eyes).   losartan (COZAAR) 50 MG tablet Take 1 tablet (50 mg total) by mouth daily.   Multiple Vitamins-Minerals (MULTIVITAMIN PO) Take 1 tablet by mouth daily.   Multiple Vitamins-Minerals (PRESERVISION AREDS PO) Take 1 capsule by mouth 2 (two) times daily.   rosuvastatin (CRESTOR) 5 MG tablet TAKE 1 TABLET BY MOUTH AT BEDTIME EVERY MONDAY, WEDNESDAY, AND FRIDAY NIGHT.   Vitamin D, Ergocalciferol, (DRISDOL) 1.25 MG (50000 UNIT) CAPS capsule TAKE 1 CAPSULE BY MOUTH ON WEDNESDAYS (Patient not taking: Reported on 10/12/2022)   No facility-administered encounter medications on file as of 10/12/2022.    Allergies (verified) Penicillins, Chocolate, Tape, and Ampicillin   History: Past Medical History:  Diagnosis Date   AKI (acute kidney injury) ([place]) 03/22/2014   Anemia    Arthritis    ATN (acute tubular necrosis) ([place]) 03/22/2014   Cataract  Heart murmur    Hypertension    Lactic acidosis 03/22/2014   PONV (postoperative nausea and vomiting)    Pre-diabetes    Sepsis due to group B Streptococcus ([place]) 04/14/2014   Severe sepsis with acute organ dysfunction ([place]) 03/22/2014   Status post PICC central line placement 04/14/2014   Urinary tract infection 03/22/2014   Past Surgical History:  Procedure Laterality Date   ABDOMINAL HYSTERECTOMY     partial   APPENDECTOMY     APPLICATION OF WOUND VAC Right 09/18/2016   Procedure: APPLICATION OF WOUND VAC;  Surgeon: Rod Can, MD;  Location: [facility];  Service: Orthopedics;  Laterality: Right;   EXCISIONAL TOTAL KNEE ARTHROPLASTY WITH ANTIBIOTIC SPACERS  Right 09/18/2016   Procedure: RESECTION OF RIGHT TOTAL KNEE ARTHROPLASTY PLACEMENT OF ANTIBIOTIC SPACER;  Surgeon: Rod Can, MD;  Location: [place] [facility];  Service: Orthopedics;  Laterality: Right;   EYE SURGERY     Lens implants   JOINT REPLACEMENT Right    KNEE   KNEE HARDWARE REMOVAL Right 11/24/2016   RECTOCELE REPAIR     TEE WITHOUT CARDIOVERSION N/A 04/09/2014   Procedure: TRANSESOPHAGEAL ECHOCARDIOGRAM (TEE);  Surgeon: Lelon Perla, MD;  Location: [facility] ENDOSCOPY;  Service: Cardiovascular;  Laterality: N/A;   TOTAL KNEE ARTHROPLASTY WITH REVISION COMPONENTS Right 11/24/2016   Procedure: Removal of right knee spacer, Right knee arthrodesis;  Surgeon: Rod Can, MD;  Location: [facility];  Service: Orthopedics;  Laterality: Right;   Family History  Problem Relation Age of Onset   Heart disease Mother    Cancer Mother        colon   Heart attack Father    Social History   Socioeconomic History   Marital status: Widowed    Spouse name: n/a   Number of children: 3   Years of education: Not on file   Highest education level: Not on file  Occupational History   Occupation: retired Biomedical scientist  Tobacco Use   Smoking status: Former    Packs/day: 1.00    Years: 2.00    Total pack years: 2.00    Types: Cigarettes    Quit date: 10/23/1976    Years since quitting: 46.0   Smokeless tobacco: Never  Vaping Use   Vaping Use: Never used  Substance and Sexual Activity   Alcohol use: No    Alcohol/week: 0.0 standard drinks of alcohol   Drug use: No   Sexual activity: Not Currently    Birth control/protection: None  Other Topics Concern   Not on file  Social History Narrative   Lives alone ("with the ghosts and the spiders.").   Her son lives nearby and helps provide care.   One daughter lives in [place], the other in [place].   Social Determinants of Health   Financial Resource Strain: Low Risk  (10/12/2022)   Overall Financial Resource Strain (CARDIA)    Difficulty of  Paying Living Expenses: Not hard at all  Food Insecurity: No Food Insecurity (10/12/2022)   Hunger Vital Sign    Worried About Running Out of Food in the Last Year: Never true    Ran Out of Food in the Last Year: Never true  Transportation Needs: No Transportation Needs (10/12/2022)   PRAPARE - Hydrologist (Medical): No    Lack of Transportation (Non-Medical): No  Physical Activity: Sufficiently Active (10/12/2022)   Exercise Vital Sign    Days of Exercise per Week: 5 days    Minutes of  Exercise per Session: 50 min  Stress: No Stress Concern Present (10/12/2022)   [place]    Feeling of Stress : Not at all  Social Connections: Socially Isolated (10/12/2022)   Social Connection and Isolation Panel [NHANES]    Frequency of Communication with Friends and Family: More than three times a week    Frequency of Social Gatherings with Friends and Family: More than three times a week    Attends Religious Services: Never    Marine scientist or Organizations: No    Attends Archivist Meetings: Never    Marital Status: Widowed   Physical Exam Vitals and nursing note reviewed.  Constitutional:      Appearance: Normal appearance. She is well-developed.     Comments: Patient is wheel chair bound.   HENT:     Head: Normocephalic and atraumatic.     Nose: Nose normal.     Mouth/Throat:     Mouth: Mucous membranes are moist.     Pharynx: Oropharynx is clear.  Eyes:     Extraocular Movements: Extraocular movements intact.     Conjunctiva/sclera: Conjunctivae normal.     Pupils: Pupils are equal, round, and reactive to light.  Cardiovascular:     Rate and Rhythm: Normal rate. Rhythm irregular.     Pulses: Normal pulses.     Heart sounds: Normal heart sounds.  Pulmonary:     Effort: Pulmonary effort is normal.     Breath sounds: Normal breath sounds.  Abdominal:     General:  Bowel sounds are normal. There is no distension.     Palpations: Abdomen is soft. There is no mass.     Tenderness: There is no abdominal tenderness. There is no right CVA tenderness, left CVA tenderness, guarding or rebound.     Hernia: No hernia is present.  Musculoskeletal:        General: Normal range of motion.     Cervical back: Normal range of motion and neck supple.  Lymphadenopathy:     Cervical: No cervical adenopathy.  Skin:    General: Skin is warm and dry.     Capillary Refill: Capillary refill takes less than 2 seconds.     Coloration: Skin is ashen and mottled.     Findings: Rash present. Rash is scaling.     Comments: Severe scaling noted on bilateral lower extremities. This is most severe on the right foot. This is non tender. Skin is intact with no evidence of draining at this time.   Neurological:     General: No focal deficit present.     Mental Status: She is alert and oriented to person, place, and time.  Psychiatric:        Mood and Affect: Mood normal.        Behavior: Behavior normal.        Thought Content: Thought content normal.        Judgment: Judgment normal.      Tobacco Counseling Counseling given: Not Answered   Clinical Intake:  Pre-visit preparation completed: Yes  Pain : No/denies pain     BMI - recorded: 37.11 Nutritional Status: BMI > 30  Obese Nutritional Risks: None Diabetes: No  How often do you need to have someone help you when you read instructions, pamphlets, or other written materials from your doctor or pharmacy?: 2 - Rarely  Diabetic?no  Interpreter Needed?: No      Activities of Daily  Living   Row Labels 10/12/2022    8:35 AM  In your present state of health, do you have any difficulty performing the following activities:   Section Header. No data exists in this row.   Hearing?   0  Vision?   1  Difficulty concentrating or making decisions?   0  Walking or climbing stairs?   1  Dressing or bathing?   0   Doing errands, shopping?   1    Patient Care Team: Lorrene Reid, PA-C as PCP - General Milus Height, MD as Referring Physician (Ophthalmology) Rod Can, MD as Consulting Physician (Orthopedic Surgery)  Indicate any recent Medical Services you may have received from other than [facility] providers in the past year (date may be approximate).     Assessment:  1. Encounter for Medicare annual wellness exam Annual medicare wellness visit today   2. Hypertensive cardiomegaly Stable. Continue medication as prescribed. Lipid panel good. Continue to monitor yearly.   3. Other osteomyelitis of right tibia ([place]) Continue keflex as prescribed. Regular visits with wound care/infectious disease as scheduled.   4. 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 Patient is wheelchair bound.    Hearing/Vision screen No results found.  Depression Screen   Row Labels 10/12/2022    8:35 AM 01/04/2022   10:02 AM 10/06/2021   10:23 AM 10/06/2021   10:14 AM 07/06/2021    8:15 AM 01/03/2021    8:21 AM 10/04/2020   10:05 AM  PHQ 2/9 Scores   Section Header. No data exists in this row.         PHQ - 2 Score   0 0 0 0 0 0 0  PHQ- 9 Score   0 0   0 0 0    Fall Risk   Row Labels 07/11/2022   10:25 AM 01/04/2022   10:01 AM 10/06/2021   10:13 AM 07/06/2021    8:15 AM 01/03/2021    8:20 AM  Fall Risk    Section Header. No data exists in this row.       Falls in the past year?   0 0 0 0 0  Number falls in past yr:   0 0 0 0   Injury with Fall?   0 0 0 0   Risk for fall due to :   No Fall Risks No Fall Risks Impaired balance/gait;Impaired mobility No Fall Risks   Follow up   Falls evaluation completed;Education provided Falls evaluation completed Falls evaluation completed Falls evaluation completed Falls evaluation completed    [place]:  Any stairs in or around the home? Yes  If so, are there any without handrails? No  Home free of loose throw rugs in walkways,  pet beds, electrical cords, etc? Yes  Adequate lighting in your home to reduce risk of falls? Yes   ASSISTIVE DEVICES UTILIZED TO PREVENT FALLS:  Life alert? Yes  Use of a cane, walker or w/c? Yes  Grab bars in the bathroom? Yes  Shower chair or bench in shower? Yes  Elevated toilet seat or a handicapped toilet? Yes   TIMED UP AND GO:  Was the test performed? No .  Length of time to ambulate 10 feet: 0 sec.   Gait slow and steady with assistive device  Cognitive Function:       Row Labels 10/12/2022    8:20 AM 10/06/2021   10:15 AM 10/04/2020   10:00 AM  6CIT Screen  Section Header. No data exists in this row.     What Year?   0 points 0 points 0 points  What month?   0 points 0 points 0 points  What time?   3 points 0 points 0 points  Count back from 20   0 points 2 points 0 points  Months in reverse   2 points 2 points 0 points  Repeat phrase   2 points 4 points 10 points  Total Score   7 points 8 points 10 points    Immunizations Immunization History  Administered Date(s) Administered   Tdap 11/24/2016    TDAP status: Up to date  Flu Vaccine status: Declined, Education has been provided regarding the importance of this vaccine but patient still declined. Advised may receive this vaccine at local pharmacy or Health Dept. Aware to provide a copy of the vaccination record if obtained from local pharmacy or Health Dept. Verbalized acceptance and understanding.  Pneumococcal vaccine status: Declined,  Education has been provided regarding the importance of this vaccine but patient still declined. Advised may receive this vaccine at local pharmacy or Health Dept. Aware to provide a copy of the vaccination record if obtained from local pharmacy or Health Dept. Verbalized acceptance and understanding.   Covid-19 vaccine status: Declined, Education has been provided regarding the importance of this vaccine but patient still declined. Advised may receive this vaccine at  local pharmacy or Health Dept.or vaccine clinic. Aware to provide a copy of the vaccination record if obtained from local pharmacy or Health Dept. Verbalized acceptance and understanding.  Qualifies for Shingles Vaccine? No   Zostavax completed No   Shingrix Completed?: No.    Education has been provided regarding the importance of this vaccine. Patient has been advised to call insurance company to determine out of pocket expense if they have not yet received this vaccine. Advised may also receive vaccine at local pharmacy or Health Dept. Verbalized acceptance and understanding.  Screening Tests Health Maintenance  Topic Date Due   COVID-19 Vaccine (1) 10/28/2022 (Originally 07/29/1934)   Zoster Vaccines- Shingrix (1 of 2) 01/11/2023 (Originally 07/29/1948)   INFLUENZA VACCINE  01/21/2023 (Originally 05/23/2022)   Pneumonia Vaccine 64+ Years old (1 - PCV) 10/13/2023 (Originally 07/29/1996)   DEXA SCAN  10/13/2023 (Originally [birth year]/29/1996)   Medicare Annual Wellness (AWV)  10/13/2023   DTaP/Tdap/Td (2 - Td or Tdap) 11/24/2026   HPV VACCINES  Aged Out    Health Maintenance  There are no preventive care reminders to display for this patient.   Colorectal cancer screening: No longer required.   Mammogram status: No longer required due to age out.  Patient declined bond density test   Lung Cancer Screening: (Low Dose CT Chest recommended if Age 34-80 years, 30 pack-year currently smoking OR have quit w/in 15years.) does not qualify.   Lung Cancer Screening Referral: n/a  Additional Screening:  Hepatitis C Screening: does not qualify; Completed no  Vision Screening: Recommended annual ophthalmology exams for early detection of glaucoma and other disorders of the eye. Is the patient up to date with their annual eye exam?  No  Who is the provider or what is the name of the office in which the patient attends annual eye exams? N/a If pt is not established with a provider, would they like to  be referred to a provider to establish care? Yes .   Dental Screening: Recommended annual dental exams for proper oral hygiene  Community Resource Referral / Chronic Care  Management: CRR required this visit?  No   CCM required this visit?  No      Plan:     I have personally reviewed and noted the following in the patient's chart:   Medical and social history Use of alcohol, tobacco or illicit drugs  Current medications and supplements including opioid prescriptions. Patient is not currently taking opioid prescriptions. Functional ability and status Nutritional status Physical activity Advanced directives List of other physicians Hospitalizations, surgeries, and ER visits in previous 12 months Vitals Screenings to include cognitive, depression, and falls Referrals and appointments  In addition, I have reviewed and discussed with patient certain preventive protocols, quality metrics, and best practice recommendations. A written personalized care plan for preventive services as well as general preventive health recommendations were provided to patient.      [name], NP   10/12/2022   Nurse Notes: face to face 25 min

## 2022-11-27 ENCOUNTER — Ambulatory Visit (INDEPENDENT_AMBULATORY_CARE_PROVIDER_SITE_OTHER): Payer: Medicare Other | Admitting: Nurse Practitioner

## 2022-11-27 ENCOUNTER — Encounter: Payer: Self-pay | Admitting: Nurse Practitioner

## 2022-11-27 VITALS — BP 130/49 | HR 66 | Ht 60.0 in | Wt 190.0 lb

## 2022-11-27 DIAGNOSIS — Z7409 Other reduced mobility: Secondary | ICD-10-CM | POA: Diagnosis not present

## 2022-11-27 DIAGNOSIS — R531 Weakness: Secondary | ICD-10-CM

## 2022-11-27 NOTE — Progress Notes (Signed)
Established patient visit   Patient: Dawn Lawson   DOB: Nov 18, 1928   89 y.o. Female  MRN: [id number] Visit Date: 11/27/2022   Chief Complaint  Patient presents with   Referral   Subjective    HPI  The patient is In the office with her son and daughter-in-law.  -patient lives alone.  -son states that patient is having a difficult tome getting up and participating In activities of daily living.  -she is having to use her life alert button several times. Son states that she has used the life alert 4 times in 5 days.  -twice she used it due to a fall. Twice she used it because she couldn't get up.  -the patient does have a lift chair In the living room. There she is able to get up without difficulty.  -she has trouble getting out of bed. She also has trouble getting on and, especially, off the toilet.  -daughter-in-law helps patient to shower. Patient does have a walk-in shower. Has grab bars and uses a 4-pronged cane which helps her manage balance.  -she is in wheelchair. Unable to bend the right leg at all.  -does have grab rails on her bed.  Has lifted seat on the toilet along with handles.  -having trouble with getting up has been a problem for about a month.     Medications: Outpatient Medications Prior to Visit  Medication Sig   acetaminophen (TYLENOL) 500 MG tablet Take 500-1,000 mg by mouth every 6 (six) hours as needed for moderate pain.   bisoprolol-hydrochlorothiazide (ZIAC) 5-6.25 MG tablet Take 1 tablet by mouth daily.   cephALEXin (KEFLEX) 500 MG capsule Take 1 capsule (500 mg total) by mouth 2 (two) times daily.   Dextran 70-Hypromellose (ARTIFICIAL TEARS PF OP) Place 1 drop into both eyes as needed (for dry eyes).   losartan (COZAAR) 50 MG tablet Take 1 tablet (50 mg total) by mouth daily.   Multiple Vitamins-Minerals (MULTIVITAMIN PO) Take 1 tablet by mouth daily.   Multiple Vitamins-Minerals (PRESERVISION AREDS PO) Take 1 capsule by mouth 2 (two) times daily.    rosuvastatin (CRESTOR) 5 MG tablet TAKE 1 TABLET BY MOUTH AT BEDTIME EVERY MONDAY, WEDNESDAY, AND FRIDAY NIGHT.   [DISCONTINUED] Vitamin D, Ergocalciferol, (DRISDOL) 1.25 MG (50000 UNIT) CAPS capsule TAKE 1 CAPSULE BY MOUTH ON WEDNESDAYS (Patient not taking: Reported on 10/12/2022)   No facility-administered medications prior to visit.    Review of Systems  Constitutional:  Positive for activity change and fatigue. Negative for appetite change, chills and fever.  HENT:  Negative for congestion, postnasal drip, rhinorrhea, sinus pressure, sinus pain, sneezing and sore throat.   Eyes: Negative.   Respiratory:  Positive for wheezing. Negative for cough, chest tightness and shortness of breath.   Cardiovascular:  Negative for chest pain and palpitations.       Elevated blood pressure   Gastrointestinal:  Negative for abdominal pain, constipation, diarrhea, nausea and vomiting.  Endocrine: Negative for cold intolerance, heat intolerance, polydipsia and polyuria.  Genitourinary:  Negative for dyspareunia, dysuria, flank pain, frequency and urgency.  Musculoskeletal:  Positive for gait problem. Negative for arthralgias, back pain and myalgias.  Skin:  Negative for rash.  Allergic/Immunologic: Negative for environmental allergies.  Neurological:  Positive for weakness. Negative for dizziness and headaches.  Hematological:  Negative for adenopathy.  Psychiatric/Behavioral:  The patient is not nervous/anxious.     Last CBC Lab Results  Component Value Date   WBC 7.2 10/02/2022   HGB  11.1 10/02/2022   HCT 33.2 (L) 10/02/2022   MCV 88 10/02/2022   MCH 29.5 10/02/2022   RDW 13.0 10/02/2022   PLT 219 [id number]   Last metabolic panel Lab Results  Component Value Date   GLUCOSE 130 (H) 10/02/2022   NA 137 10/02/2022   K 4.2 10/02/2022   CL 101 10/02/2022   CO2 23 10/02/2022   BUN 25 10/02/2022   CREATININE 1.10 (H) 10/02/2022   EGFR 47 (L) 10/02/2022   CALCIUM 9.0 10/02/2022   PHOS  4.3 08/15/2019   PROT 6.7 10/02/2022   ALBUMIN 3.9 10/02/2022   LABGLOB 2.8 10/02/2022   AGRATIO 1.4 10/02/2022   BILITOT 0.6 10/02/2022   ALKPHOS 71 10/02/2022   AST 19 10/02/2022   ALT 15 10/02/2022   ANIONGAP 12 06/17/2020   Last lipids Lab Results  Component Value Date   CHOL 99 (L) 10/02/2022   HDL 32 (L) 10/02/2022   LDLCALC 51 10/02/2022   TRIG 78 10/02/2022   CHOLHDL 3.1 10/02/2022   Last hemoglobin A1c Lab Results  Component Value Date   HGBA1C 6.1 (H) 10/02/2022   Last thyroid functions Lab Results  Component Value Date   TSH 2.180 08/15/2019   T3TOTAL 114 08/15/2019   Last vitamin D Lab Results  Component Value Date   VD25OH 64.1 10/02/2022       Objective     Today's Vitals   11/27/22 1038  BP: (Abnormal) 130/49  Pulse: 66  SpO2: 95%  Weight: 190 lb (86.2 kg)  Height: 5' (1.524 m)   Body mass index is 37.11 kg/m.  BP Readings from Last 3 Encounters:  11/27/22 (Abnormal) 130/49  10/12/22 (Abnormal) 125/57  01/04/22 118/70    Wt Readings from Last 3 Encounters:  11/27/22 190 lb (86.2 kg)  10/12/22 190 lb (86.2 kg)  01/04/22 190 lb (86.2 kg)    Physical Exam Vitals and nursing note reviewed.  Constitutional:      Appearance: Normal appearance. She is well-developed.  HENT:     Head: Normocephalic and atraumatic.     Nose: Nose normal.     Mouth/Throat:     Mouth: Mucous membranes are moist.     Pharynx: Oropharynx is clear.  Eyes:     Extraocular Movements: Extraocular movements intact.     Conjunctiva/sclera: Conjunctivae normal.     Pupils: Pupils are equal, round, and reactive to light.  Neck:     Vascular: No carotid bruit.  Cardiovascular:     Rate and Rhythm: Normal rate and regular rhythm.     Pulses: Normal pulses.     Heart sounds: Murmur heard.  Pulmonary:     Effort: Pulmonary effort is normal.     Breath sounds: Normal breath sounds.  Abdominal:     Palpations: Abdomen is soft.  Musculoskeletal:         General: Normal range of motion.     Cervical back: Normal range of motion and neck supple.  Lymphadenopathy:     Cervical: No cervical adenopathy.  Skin:    General: Skin is warm and dry.     Capillary Refill: Capillary refill takes less than 2 seconds.  Neurological:     General: No focal deficit present.     Mental Status: She is alert and oriented to person, place, and time. Mental status is at baseline.     Motor: Weakness present.  Psychiatric:        Mood and Affect: Mood normal.  Behavior: Behavior normal.        Thought Content: Thought content normal.        Judgment: Judgment normal.      Assessment & Plan    1. 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 Worsening mobility problems. Having difficulty transferring from her chair to bed or toilet and then back. Has fallen several times and is unable to get up. Refer to home health for further evaluation.  - Ambulatory referral to [place]  2. Weakness Fallen several times and has not had assistance readily available. Refer to home health for further evaluation.  - Ambulatory referral to [place]  3. Difficulty transferring from bed to chair Ability to transfer out of chair getting more difficult. Refer to home health for further evaluation.  - Ambulatory referral to [place]  4. Difficulty transferring from chair to toilet Ability to transfer out of chair getting more difficult. Refer to home health for further evaluation.  - Ambulatory referral to [place] List Items Addressed This Visit       Other   Difficulty transferring from chair to toilet - Primary   Relevant Orders   Ambulatory referral to Home Health   Weakness   Relevant Orders   Ambulatory referral to [place]     Return for as scheduled.         [name], NP  [facility] at [facility] [phone] (phone) [phone] (fax)  [place]

## 2022-12-11 ENCOUNTER — Telehealth: Payer: Self-pay

## 2022-12-11 NOTE — Telephone Encounter (Signed)
Checked and did not see this referral placed. Please place order and I will get this to the right people. Amayra Kiedrowski Zimmerman Rumple, CMA

## 2022-12-11 NOTE — Telephone Encounter (Signed)
Pt son is calling checking the status of [facility] referral.  [phone]

## 2022-12-12 DIAGNOSIS — R531 Weakness: Secondary | ICD-10-CM | POA: Insufficient documentation

## 2022-12-12 NOTE — Telephone Encounter (Signed)
Per Nira Conn she is advised of the referral

## 2022-12-12 NOTE — Telephone Encounter (Signed)
I have finished this note and made referral for her. Sorry for the delay.

## 2022-12-15 NOTE — Telephone Encounter (Signed)
Referral has been sent and they will contact pt to have this started. Dawn Lawson, CMA

## 2022-12-29 ENCOUNTER — Other Ambulatory Visit: Payer: Self-pay | Admitting: Nurse Practitioner

## 2022-12-29 DIAGNOSIS — E785 Hyperlipidemia, unspecified: Secondary | ICD-10-CM

## 2023-01-01 ENCOUNTER — Encounter (HOSPITAL_COMMUNITY): Payer: Self-pay

## 2023-01-01 ENCOUNTER — Inpatient Hospital Stay (HOSPITAL_COMMUNITY)
Admission: EM | Admit: 2023-01-01 | Discharge: 2023-01-08 | DRG: 280 | Disposition: A | Discharge status: Skilled Nursing Facility | Payer: Medicare Other | Attending: Internal Medicine | Admitting: Internal Medicine

## 2023-01-01 ENCOUNTER — Emergency Department (HOSPITAL_COMMUNITY): Payer: Medicare Other

## 2023-01-01 ENCOUNTER — Other Ambulatory Visit: Payer: Self-pay

## 2023-01-01 DIAGNOSIS — E782 Mixed hyperlipidemia: Secondary | ICD-10-CM | POA: Diagnosis not present

## 2023-01-01 DIAGNOSIS — I959 Hypotension, unspecified: Secondary | ICD-10-CM | POA: Diagnosis not present

## 2023-01-01 DIAGNOSIS — Z8249 Family history of ischemic heart disease and other diseases of the circulatory system: Secondary | ICD-10-CM

## 2023-01-01 DIAGNOSIS — I10 Essential (primary) hypertension: Secondary | ICD-10-CM | POA: Diagnosis not present

## 2023-01-01 DIAGNOSIS — Z792 Long term (current) use of antibiotics: Secondary | ICD-10-CM | POA: Diagnosis not present

## 2023-01-01 DIAGNOSIS — E785 Hyperlipidemia, unspecified: Secondary | ICD-10-CM | POA: Diagnosis present

## 2023-01-01 DIAGNOSIS — R2243 Localized swelling, mass and lump, lower limb, bilateral: Secondary | ICD-10-CM | POA: Diagnosis not present

## 2023-01-01 DIAGNOSIS — D72829 Elevated white blood cell count, unspecified: Secondary | ICD-10-CM | POA: Diagnosis present

## 2023-01-01 DIAGNOSIS — M7981 Nontraumatic hematoma of soft tissue: Secondary | ICD-10-CM | POA: Diagnosis not present

## 2023-01-01 DIAGNOSIS — Z66 Do not resuscitate: Secondary | ICD-10-CM | POA: Diagnosis present

## 2023-01-01 DIAGNOSIS — R001 Bradycardia, unspecified: Secondary | ICD-10-CM | POA: Diagnosis present

## 2023-01-01 DIAGNOSIS — I21A1 Myocardial infarction type 2: Secondary | ICD-10-CM | POA: Diagnosis present

## 2023-01-01 DIAGNOSIS — I110 Hypertensive heart disease with heart failure: Secondary | ICD-10-CM | POA: Diagnosis not present

## 2023-01-01 DIAGNOSIS — T45515A Adverse effect of anticoagulants, initial encounter: Secondary | ICD-10-CM | POA: Diagnosis not present

## 2023-01-01 DIAGNOSIS — I5033 Acute on chronic diastolic (congestive) heart failure: Secondary | ICD-10-CM

## 2023-01-01 DIAGNOSIS — Z880 Allergy status to penicillin: Secondary | ICD-10-CM | POA: Diagnosis not present

## 2023-01-01 DIAGNOSIS — Z91018 Allergy to other foods: Secondary | ICD-10-CM | POA: Diagnosis not present

## 2023-01-01 DIAGNOSIS — H3530 Unspecified macular degeneration: Secondary | ICD-10-CM | POA: Diagnosis not present

## 2023-01-01 DIAGNOSIS — Z1152 Encounter for screening for COVID-19: Secondary | ICD-10-CM | POA: Diagnosis not present

## 2023-01-01 DIAGNOSIS — R0602 Shortness of breath: Secondary | ICD-10-CM

## 2023-01-01 DIAGNOSIS — I5031 Acute diastolic (congestive) heart failure: Secondary | ICD-10-CM

## 2023-01-01 DIAGNOSIS — I480 Paroxysmal atrial fibrillation: Secondary | ICD-10-CM | POA: Diagnosis present

## 2023-01-01 DIAGNOSIS — R627 Adult failure to thrive: Secondary | ICD-10-CM | POA: Diagnosis present

## 2023-01-01 DIAGNOSIS — Z881 Allergy status to other antibiotic agents status: Secondary | ICD-10-CM

## 2023-01-01 DIAGNOSIS — J810 Acute pulmonary edema: Secondary | ICD-10-CM | POA: Diagnosis not present

## 2023-01-01 DIAGNOSIS — M1990 Unspecified osteoarthritis, unspecified site: Secondary | ICD-10-CM | POA: Diagnosis present

## 2023-01-01 DIAGNOSIS — T148XXA Other injury of unspecified body region, initial encounter: Secondary | ICD-10-CM

## 2023-01-01 DIAGNOSIS — E669 Obesity, unspecified: Secondary | ICD-10-CM | POA: Diagnosis present

## 2023-01-01 DIAGNOSIS — J90 Pleural effusion, not elsewhere classified: Secondary | ICD-10-CM | POA: Diagnosis not present

## 2023-01-01 DIAGNOSIS — R0902 Hypoxemia: Secondary | ICD-10-CM | POA: Diagnosis not present

## 2023-01-01 DIAGNOSIS — R058 Other specified cough: Secondary | ICD-10-CM | POA: Diagnosis not present

## 2023-01-01 DIAGNOSIS — R059 Cough, unspecified: Secondary | ICD-10-CM | POA: Diagnosis not present

## 2023-01-01 DIAGNOSIS — Z96651 Presence of right artificial knee joint: Secondary | ICD-10-CM | POA: Diagnosis present

## 2023-01-01 DIAGNOSIS — Z6832 Body mass index (BMI) 32.0-32.9, adult: Secondary | ICD-10-CM

## 2023-01-01 DIAGNOSIS — R7989 Other specified abnormal findings of blood chemistry: Secondary | ICD-10-CM

## 2023-01-01 DIAGNOSIS — Z90710 Acquired absence of both cervix and uterus: Secondary | ICD-10-CM

## 2023-01-01 DIAGNOSIS — M6281 Muscle weakness (generalized): Secondary | ICD-10-CM | POA: Diagnosis not present

## 2023-01-01 DIAGNOSIS — R41841 Cognitive communication deficit: Secondary | ICD-10-CM | POA: Diagnosis not present

## 2023-01-01 DIAGNOSIS — R531 Weakness: Secondary | ICD-10-CM

## 2023-01-01 DIAGNOSIS — I272 Pulmonary hypertension, unspecified: Secondary | ICD-10-CM | POA: Diagnosis present

## 2023-01-01 DIAGNOSIS — I4891 Unspecified atrial fibrillation: Secondary | ICD-10-CM | POA: Diagnosis not present

## 2023-01-01 DIAGNOSIS — R1312 Dysphagia, oropharyngeal phase: Secondary | ICD-10-CM | POA: Diagnosis not present

## 2023-01-01 DIAGNOSIS — I4819 Other persistent atrial fibrillation: Secondary | ICD-10-CM | POA: Diagnosis not present

## 2023-01-01 DIAGNOSIS — J80 Acute respiratory distress syndrome: Secondary | ICD-10-CM | POA: Diagnosis not present

## 2023-01-01 DIAGNOSIS — I214 Non-ST elevation (NSTEMI) myocardial infarction: Secondary | ICD-10-CM

## 2023-01-01 DIAGNOSIS — Z87891 Personal history of nicotine dependence: Secondary | ICD-10-CM

## 2023-01-01 DIAGNOSIS — R0603 Acute respiratory distress: Secondary | ICD-10-CM | POA: Diagnosis not present

## 2023-01-01 DIAGNOSIS — R7303 Prediabetes: Secondary | ICD-10-CM | POA: Diagnosis present

## 2023-01-01 DIAGNOSIS — Z9181 History of falling: Secondary | ICD-10-CM

## 2023-01-01 DIAGNOSIS — I422 Other hypertrophic cardiomyopathy: Secondary | ICD-10-CM | POA: Diagnosis not present

## 2023-01-01 DIAGNOSIS — Z79899 Other long term (current) drug therapy: Secondary | ICD-10-CM | POA: Diagnosis not present

## 2023-01-01 DIAGNOSIS — R2689 Other abnormalities of gait and mobility: Secondary | ICD-10-CM | POA: Diagnosis not present

## 2023-01-01 DIAGNOSIS — D6869 Other thrombophilia: Secondary | ICD-10-CM | POA: Diagnosis not present

## 2023-01-01 DIAGNOSIS — K5730 Diverticulosis of large intestine without perforation or abscess without bleeding: Secondary | ICD-10-CM | POA: Diagnosis not present

## 2023-01-01 DIAGNOSIS — J811 Chronic pulmonary edema: Secondary | ICD-10-CM | POA: Diagnosis not present

## 2023-01-01 DIAGNOSIS — I509 Heart failure, unspecified: Principal | ICD-10-CM

## 2023-01-01 DIAGNOSIS — S7012XA Contusion of left thigh, initial encounter: Secondary | ICD-10-CM | POA: Diagnosis not present

## 2023-01-01 DIAGNOSIS — M158 Other polyosteoarthritis: Secondary | ICD-10-CM | POA: Diagnosis not present

## 2023-01-01 DIAGNOSIS — J918 Pleural effusion in other conditions classified elsewhere: Secondary | ICD-10-CM | POA: Diagnosis not present

## 2023-01-01 DIAGNOSIS — J9811 Atelectasis: Secondary | ICD-10-CM | POA: Diagnosis not present

## 2023-01-01 DIAGNOSIS — M8668 Other chronic osteomyelitis, other site: Secondary | ICD-10-CM | POA: Diagnosis not present

## 2023-01-01 DIAGNOSIS — M7989 Other specified soft tissue disorders: Secondary | ICD-10-CM | POA: Diagnosis not present

## 2023-01-01 DIAGNOSIS — Z91048 Other nonmedicinal substance allergy status: Secondary | ICD-10-CM

## 2023-01-01 DIAGNOSIS — R54 Age-related physical debility: Secondary | ICD-10-CM | POA: Diagnosis present

## 2023-01-01 DIAGNOSIS — Z7401 Bed confinement status: Secondary | ICD-10-CM | POA: Diagnosis not present

## 2023-01-01 DIAGNOSIS — E8809 Other disorders of plasma-protein metabolism, not elsewhere classified: Secondary | ICD-10-CM | POA: Diagnosis not present

## 2023-01-01 LAB — URINALYSIS, ROUTINE W REFLEX MICROSCOPIC
Bilirubin Urine: NEGATIVE
Glucose, UA: NEGATIVE mg/dL
Hgb urine dipstick: NEGATIVE
Ketones, ur: NEGATIVE mg/dL
Leukocytes,Ua: NEGATIVE
Nitrite: NEGATIVE
Protein, ur: NEGATIVE mg/dL
Specific Gravity, Urine: 1.01 (ref 1.005–1.030)
pH: 5 (ref 5.0–8.0)

## 2023-01-01 LAB — CBC
HCT: 33.8 % — ABNORMAL LOW (ref 36.0–46.0)
Hemoglobin: 10.9 g/dL — ABNORMAL LOW (ref 12.0–15.0)
MCH: 29 pg (ref 26.0–34.0)
MCHC: 32.2 g/dL (ref 30.0–36.0)
MCV: 89.9 fL (ref 80.0–100.0)
Platelets: 247 10*3/uL (ref 150–400)
RBC: 3.76 MIL/uL — ABNORMAL LOW (ref 3.87–5.11)
RDW: 16.1 % — ABNORMAL HIGH (ref 11.5–15.5)
WBC: 10.6 10*3/uL — ABNORMAL HIGH (ref 4.0–10.5)
nRBC: 0 % (ref 0.0–0.2)

## 2023-01-01 LAB — COMPREHENSIVE METABOLIC PANEL
ALT: 11 U/L (ref 0–44)
AST: 16 U/L (ref 15–41)
Albumin: 3.1 g/dL — ABNORMAL LOW (ref 3.5–5.0)
Alkaline Phosphatase: 48 U/L (ref 38–126)
Anion gap: 8 (ref 5–15)
BUN: 36 mg/dL — ABNORMAL HIGH (ref 8–23)
CO2: 25 mmol/L (ref 22–32)
Calcium: 8.2 mg/dL — ABNORMAL LOW (ref 8.9–10.3)
Chloride: 98 mmol/L (ref 98–111)
Creatinine, Ser: 0.98 mg/dL (ref 0.44–1.00)
GFR, Estimated: 54 mL/min — ABNORMAL LOW (ref >60)
Glucose, Bld: 96 mg/dL (ref 70–99)
Potassium: 4.7 mmol/L (ref 3.5–5.1)
Sodium: 131 mmol/L — ABNORMAL LOW (ref 135–145)
Total Bilirubin: 0.7 mg/dL (ref 0.3–1.2)
Total Protein: 5.6 g/dL — ABNORMAL LOW (ref 6.5–8.1)

## 2023-01-01 LAB — ECHOCARDIOGRAM COMPLETE
Area-P 1/2: 4.19 cm2
Calc EF: 65.6 %
Height: 62 in
MV M vel: 4.48 m/s
MV Peak grad: 80.3 mmHg
P 1/2 time: 426 msec
S' Lateral: 2.3 cm
Single Plane A2C EF: 65.6 %
Single Plane A4C EF: 65.2 %

## 2023-01-01 LAB — TROPONIN I (HIGH SENSITIVITY)
Troponin I (High Sensitivity): 554 ng/L (ref <18)
Troponin I (High Sensitivity): 506 ng/L (ref <18)

## 2023-01-01 LAB — RESP PANEL BY RT-PCR (RSV, FLU A&B, COVID)  RVPGX2
Influenza A by PCR: NEGATIVE
Influenza B by PCR: NEGATIVE
Resp Syncytial Virus by PCR: NEGATIVE
SARS Coronavirus 2 by RT PCR: NEGATIVE

## 2023-01-01 LAB — TSH: TSH: 1.448 u[IU]/mL (ref 0.350–4.500)

## 2023-01-01 LAB — BRAIN NATRIURETIC PEPTIDE: B Natriuretic Peptide: 1209.2 pg/mL — ABNORMAL HIGH (ref 0.0–100.0)

## 2023-01-01 LAB — GLUCOSE, CAPILLARY
Glucose-Capillary: 85 mg/dL (ref 70–99)
Glucose-Capillary: 104 mg/dL — ABNORMAL HIGH (ref 70–99)

## 2023-01-01 MED ORDER — ALBUTEROL SULFATE (2.5 MG/3ML) 0.083% IN NEBU
2.5000 mg | INHALATION_SOLUTION | RESPIRATORY_TRACT | Status: DC | PRN
  Administered 2023-01-01 – 2023-01-02 (×2): 2.5 mg via RESPIRATORY_TRACT
  Filled 2023-01-01 (×2): qty 3

## 2023-01-01 MED ORDER — ONDANSETRON HCL 4 MG/2ML IJ SOLN: 4.0000 mg | Freq: Four times a day (QID) | INTRAMUSCULAR | Status: DC | PRN

## 2023-01-01 MED ORDER — ASPIRIN 81 MG PO TBEC
81.0000 mg | DELAYED_RELEASE_TABLET | Freq: Every day | ORAL | Status: DC
  Administered 2023-01-01 – 2023-01-04 (×4): 81 mg via ORAL
  Filled 2023-01-01 (×4): qty 1

## 2023-01-01 MED ORDER — INSULIN ASPART 100 UNIT/ML IJ SOLN
0.0000 [IU] | Freq: Three times a day (TID) | INTRAMUSCULAR | Status: DC
  Administered 2023-01-03 (×2): 1 [IU] via SUBCUTANEOUS
  Administered 2023-01-04 (×2): 2 [IU] via SUBCUTANEOUS
  Administered 2023-01-04: 1 [IU] via SUBCUTANEOUS
  Administered 2023-01-05: 3 [IU] via SUBCUTANEOUS
  Administered 2023-01-05 – 2023-01-06 (×2): 2 [IU] via SUBCUTANEOUS
  Administered 2023-01-06: 1 [IU] via SUBCUTANEOUS
  Administered 2023-01-07: 3 [IU] via SUBCUTANEOUS
  Administered 2023-01-07: 2 [IU] via SUBCUTANEOUS
  Administered 2023-01-08 (×2): 1 [IU] via SUBCUTANEOUS

## 2023-01-01 MED ORDER — HEPARIN BOLUS VIA INFUSION
3000.0000 [IU] | Freq: Once | INTRAVENOUS | Status: AC
  Administered 2023-01-01: 3000 [IU] via INTRAVENOUS
  Filled 2023-01-01: qty 3000

## 2023-01-01 MED ORDER — FUROSEMIDE 10 MG/ML IJ SOLN
40.0000 mg | Freq: Two times a day (BID) | INTRAMUSCULAR | Status: DC
  Administered 2023-01-01: 40 mg via INTRAVENOUS
  Filled 2023-01-01: qty 4

## 2023-01-01 MED ORDER — HEPARIN (PORCINE) 25000 UT/250ML-% IV SOLN
1100.0000 [IU]/h | INTRAVENOUS | Status: DC
  Administered 2023-01-01: 1000 [IU]/h via INTRAVENOUS
  Administered 2023-01-03: 1100 [IU]/h via INTRAVENOUS
  Filled 2023-01-01 (×3): qty 250

## 2023-01-01 MED ORDER — ACETAMINOPHEN 325 MG PO TABS
650.0000 mg | ORAL_TABLET | Freq: Four times a day (QID) | ORAL | Status: DC | PRN
  Administered 2023-01-02 – 2023-01-05 (×5): 650 mg via ORAL
  Filled 2023-01-01 (×5): qty 2

## 2023-01-01 MED ORDER — ASPIRIN 81 MG PO CHEW
324.0000 mg | CHEWABLE_TABLET | Freq: Once | ORAL | Status: AC
  Administered 2023-01-01: 324 mg via ORAL
  Filled 2023-01-01: qty 4

## 2023-01-01 MED ORDER — LACTATED RINGERS IV BOLUS
500.0000 mL | Freq: Once | INTRAVENOUS | Status: AC
  Administered 2023-01-01: 500 mL via INTRAVENOUS

## 2023-01-01 MED ORDER — EMPAGLIFLOZIN 10 MG PO TABS: 10.0000 mg | ORAL_TABLET | Freq: Every day | ORAL | Status: DC

## 2023-01-01 MED ORDER — ONDANSETRON HCL 4 MG PO TABS: 4.0000 mg | ORAL_TABLET | Freq: Four times a day (QID) | ORAL | Status: DC | PRN

## 2023-01-01 MED ORDER — FUROSEMIDE 10 MG/ML IJ SOLN
20.0000 mg | Freq: Once | INTRAMUSCULAR | Status: AC
  Administered 2023-01-01: 20 mg via INTRAVENOUS
  Filled 2023-01-01: qty 4

## 2023-01-01 MED ORDER — ACETAMINOPHEN 650 MG RE SUPP: 650.0000 mg | Freq: Four times a day (QID) | RECTAL | Status: DC | PRN

## 2023-01-01 NOTE — Progress Notes (Signed)
Echocardiogram 2D Echocardiogram has been performed.  Dawn Lawson 01/01/2023, 1:45 PM

## 2023-01-01 NOTE — ED Provider Notes (Addendum)
[place] EMERGENCY DEPARTMENT AT [facility] Provider Note   CSN: [id number] Arrival date & time: 01/01/23  0805     History  Chief Complaint  Patient presents with   Failure To Thrive   Shortness of Breath         Dawn Lawson is a 89 y.o. female.  Pt with general weakness, non prod cough and sob. Symptoms present in past 3-4 days, moderate, persistent. No sore throat or runny nose. No specific known ill contacts. Denies chest pain. No headache. No abd pain or nvd. No dysuria or gu c/o. No new or worsening leg swelling or pain. No fever or chills.   The history is provided by the patient, medical records, the EMS personnel and a relative. The history is limited by the condition of the patient.  Shortness of Breath Associated symptoms: cough   Associated symptoms: no abdominal pain, no chest pain, no fever, no headaches, no neck pain, no rash, no sore throat and no vomiting        Home Medications Prior to Admission medications   Medication Sig Start Date End Date Taking? Authorizing Provider  acetaminophen (TYLENOL) 500 MG tablet Take 500-1,000 mg by mouth every 6 (six) hours as needed for moderate pain.    [provider]  bisoprolol-hydrochlorothiazide (ZIAC) 5-6.25 MG tablet Take 1 tablet by mouth daily. 07/11/22   Lorrene Reid, PA-C  cephALEXin (KEFLEX) 500 MG capsule Take 1 capsule (500 mg total) by mouth 2 (two) times daily. 07/25/22   Abonza, Herb Grays, PA-C  Dextran 70-Hypromellose (ARTIFICIAL TEARS PF OP) Place 1 drop into both eyes as needed (for dry eyes).    [provider]  losartan (COZAAR) 50 MG tablet Take 1 tablet (50 mg total) by mouth daily. 07/11/22   Lorrene Reid, PA-C  Multiple Vitamins-Minerals (MULTIVITAMIN PO) Take 1 tablet by mouth daily.    [provider]  Multiple Vitamins-Minerals (PRESERVISION AREDS PO) Take 1 capsule by mouth 2 (two) times daily.    [provider]  rosuvastatin (CRESTOR) 5 MG  tablet TAKE 1 TABLET BY MOUTH AT BEDTIME EVERY MONDAY, WEDNESDAY, AND FRIDAY NIGHT. 01/01/23   Velva Harman, PA      Allergies    Penicillins, Chocolate, Tape, and Ampicillin    Review of Systems   Review of Systems  Constitutional:  Negative for chills and fever.  HENT:  Negative for sore throat.   Eyes:  Negative for redness.  Respiratory:  Positive for cough and shortness of breath.   Cardiovascular:  Negative for chest pain.  Gastrointestinal:  Negative for abdominal pain, blood in stool, diarrhea and vomiting.  Genitourinary:  Negative for dysuria and flank pain.  Musculoskeletal:  Negative for back pain and neck pain.  Skin:  Negative for rash.  Neurological:  Negative for headaches.  Hematological:  Does not bruise/bleed easily.  Psychiatric/Behavioral:  Negative for confusion.     Physical Exam Updated Vital Signs BP (!) 129/50   Pulse 69   Temp (!) 97.5 F (36.4 C) (Oral)   Resp (!) 22   Ht 1.575 m ('5\' 2"'$ )   SpO2 98%   BMI 34.75 kg/m  Physical Exam Vitals and nursing note reviewed.  Constitutional:      Appearance: Normal appearance. She is well-developed.  HENT:     Head: Atraumatic.     Nose: Nose normal.     Mouth/Throat:     Mouth: Mucous membranes are dry.     Pharynx: Oropharynx  is clear.  Eyes:     General: No scleral icterus.    Conjunctiva/sclera: Conjunctivae normal.     Pupils: Pupils are equal, round, and reactive to light.  Neck:     Vascular: No carotid bruit.     Trachea: No tracheal deviation.     Comments: No stiffness or rigidity Cardiovascular:     Rate and Rhythm: Normal rate and regular rhythm.     Pulses: Normal pulses.     Heart sounds: Normal heart sounds. No murmur heard.    No friction rub. No gallop.  Pulmonary:     Effort: Pulmonary effort is normal. No respiratory distress.     Breath sounds: Normal breath sounds.  Abdominal:     General: Bowel sounds are normal. There is no distension.     Palpations: Abdomen is  soft.     Tenderness: There is no abdominal tenderness.  Genitourinary:    Comments: No cva tenderness.  Musculoskeletal:     Cervical back: Normal range of motion and neck supple. No rigidity or tenderness. No muscular tenderness.     Comments: Pt/fam note chronic symmetric swelling to bilateral legs.   Skin:    General: Skin is warm and dry.     Findings: No rash.  Neurological:     Mental Status: She is alert.     Comments: Alert, speech normal. Oriented. Motor/sens grossly intact bil.   Psychiatric:        Mood and Affect: Mood normal.     ED Results / Procedures / Treatments   Labs (all labs ordered are listed, but only abnormal results are displayed) Results for orders placed or performed during the hospital encounter of 01/01/23  Resp panel by RT-PCR (RSV, Flu A&B, Covid) Anterior Nasal Swab   Specimen: Anterior Nasal Swab  Result Value Ref Range   SARS Coronavirus 2 by RT PCR NEGATIVE NEGATIVE   Influenza A by PCR NEGATIVE NEGATIVE   Influenza B by PCR NEGATIVE NEGATIVE   Resp Syncytial Virus by PCR NEGATIVE NEGATIVE  CBC  Result Value Ref Range   WBC 10.6 (H) 4.0 - 10.5 K/uL   RBC 3.76 (L) 3.87 - 5.11 MIL/uL   Hemoglobin 10.9 (L) 12.0 - 15.0 g/dL   HCT 33.8 (L) 36.0 - 46.0 %   MCV 89.9 80.0 - 100.0 fL   MCH 29.0 26.0 - 34.0 pg   MCHC 32.2 30.0 - 36.0 g/dL   RDW 16.1 (H) 11.5 - 15.5 %   Platelets 247 150 - 400 K/uL   nRBC 0.0 0.0 - 0.2 %  Brain natriuretic peptide  Result Value Ref Range   B Natriuretic Peptide 1,209.2 (H) 0.0 - 100.0 pg/mL  Comprehensive metabolic panel  Result Value Ref Range   Sodium 131 (L) 135 - 145 mmol/L   Potassium 4.7 3.5 - 5.1 mmol/L   Chloride 98 98 - 111 mmol/L   CO2 25 22 - 32 mmol/L   Glucose, Bld 96 70 - 99 mg/dL   BUN 36 (H) 8 - 23 mg/dL   Creatinine, Ser 0.98 0.44 - 1.00 mg/dL   Calcium 8.2 (L) 8.9 - 10.3 mg/dL   Total Protein 5.6 (L) 6.5 - 8.1 g/dL   Albumin 3.1 (L) 3.5 - 5.0 g/dL   AST 16 15 - 41 U/L   ALT 11 0 - 44  U/L   Alkaline Phosphatase 48 38 - 126 U/L   Total Bilirubin 0.7 0.3 - 1.2 mg/dL   GFR, Estimated 54 (  L) >60 mL/min   Anion gap 8 5 - 15  Troponin I (High Sensitivity)  Result Value Ref Range   Troponin I (High Sensitivity) 554 (HH) <18 ng/L     EKG EKG Interpretation  Date/Time:  Monday January 01 2023 11:39:34 EDT Ventricular Rate:  54 PR Interval:    QRS Duration: 92 QT Interval:  452 QTC Calculation: 433 R Axis:   93 Text Interpretation: Atrial fibrillation with slow ventricular response Right axis deviation Non-specific ST-t changes Confirmed by Lajean Saver [phone]) on 01/01/2023 12:05:16 PM  Radiology DG Chest Port 1 View  Result Date: 01/01/2023 CLINICAL DATA:  Weakness, losing strength and mobility slowly over several weeks now, sharper decline last week EXAM: PORTABLE CHEST 1 VIEW COMPARISON:  Portable exam 0904 hours compared to 07/16/2019 FINDINGS: Enlargement of cardiac silhouette with pulmonary vascular congestion. Atherosclerotic calcification aorta. BILATERAL interstitial infiltrates consistent with pulmonary edema and CHF. LEFT pleural effusion and basilar atelectasis. No pneumothorax. Bones demineralized with advanced BILATERAL glenohumeral degenerative changes and associated calcified loose bodies. IMPRESSION: CHF with small LEFT pleural effusion. Electronically Signed   By: Lavonia Dana M.D.   On: 01/01/2023 09:49    Procedures Procedures    Medications Ordered in ED Medications  lactated ringers bolus 500 mL (500 mLs Intravenous New Bag/Given 01/01/23 [id number])    ED Course/ Medical Decision Making/ A&P                             Medical Decision Making Problems Addressed: Acute congestive heart failure, unspecified heart failure type [facility]): acute illness or injury with systemic symptoms that poses a threat to life or bodily functions Atrial fibrillation with slow ventricular response ([place]): acute illness or injury with systemic symptoms that poses a threat to  life or bodily functions Elevated troponin: acute illness or injury Generalized weakness: acute illness or injury with systemic symptoms that poses a threat to life or bodily functions Non-productive cough: acute illness or injury NSTEMI (non-ST elevated myocardial infarction) [facility]): acute illness or injury with systemic symptoms that poses a threat to life or bodily functions Shortness of breath: acute illness or injury  Amount and/or Complexity of Data Reviewed Independent Historian: EMS    Details: Ems/fam, hx External Data Reviewed: notes. Labs: ordered. Decision-making details documented in ED Course. Radiology: ordered and independent interpretation performed. Decision-making details documented in ED Course. ECG/medicine tests: ordered and independent interpretation performed. Decision-making details documented in ED Course. Discussion of management or test interpretation with external provider(s): Hosp, card  Risk OTC drugs. Prescription drug management. Decision regarding hospitalization.   Iv ns. Continuous pulse ox and cardiac monitoring. Labs ordered/sent. Imaging ordered.   Differential diagnosis includes dehydration, aki, uti, covid/flu, etc. Dispo decision including potential need for admission considered - will get labs and imaging and reassess.   Reviewed nursing notes and prior charts for additional history. External reports reviewed. Additional history from: ems/family.   Cardiac monitor: bradycardic, ?afib, rate 50.  LR bolus.   Labs reviewed/interpreted by me - k normal. Bun elev. Bnp and trop both high. Pt denies chest pain.   Xrays reviewed/interpreted by me - vascular congestion.  Given bradycardia, afib, elev trop and bnp - will admit.   CRITICAL CARE RE: general weakness with sob, nstemi/chf, afib/bradycardia Performed by: Mirna Mires Total critical care time: 40 minutes Critical care time was exclusive of separately billable procedures and  treating other patients. Critical care was necessary to treat  or prevent imminent or life-threatening deterioration. Critical care was time spent personally by me on the following activities: development of treatment plan with patient and/or surrogate as well as nursing, discussions with consultants, evaluation of patient's response to treatment, examination of patient, obtaining history from patient or surrogate, ordering and performing treatments and interventions, ordering and review of laboratory studies, ordering and review of radiographic studies, pulse oximetry and re-evaluation of patient's condition.  Discussed with cardiology -  they indicate agree w echo, trend trop, and they will plan to see in AM 3/12.             Final Clinical Impression(s) / ED Diagnoses Final diagnoses:  None    Rx / DC Orders ED Discharge Orders     None             Lajean Saver, MD 01/01/23 1435

## 2023-01-01 NOTE — ED Notes (Signed)
ED TO INPATIENT HANDOFF REPORT  Name/Age/Gender Dawn Lawson 89 y.o. female  Code Status Code Status History     Date Active Date Inactive Code Status Order ID Comments User Context   02/22/2017 1720 02/23/2017 1624 DNR [id number]  Collier Salina, MD Inpatient   11/24/2016 1322 11/28/2016 1801 Full Code [id number]  Rod Can, MD Inpatient   09/18/2016 2142 09/22/2016 1719 Full Code [id number]  Rod Can, MD Inpatient   03/22/2014 2110 03/26/2014 1704 Full Code [id number]  Etta Quill, DO ED    Questions for Most Recent Historical Code Status (Order [id number])     Question Answer   In the event of cardiac or respiratory ARREST Do not call a "code blue"   In the event of cardiac or respiratory ARREST Do not perform Intubation, CPR, defibrillation or ACLS   In the event of cardiac or respiratory ARREST Use medication by any route, position, wound care, and other measures to relive pain and suffering. May use oxygen, suction and manual treatment of airway obstruction as needed for comfort.                Advance Directive Documentation    Flowsheet Row Most Recent Value  Type of Advance Directive Healthcare Power of Attorney  Pre-existing out of facility DNR order (yellow form or pink MOST form) --  "MOST" Form in Place? --       Home/SNF/Other Home  Chief Complaint CHF (congestive heart failure) ([place]) [I50.9]  Level of Care/Admitting Diagnosis ED Disposition     ED Disposition  Admit   Condition  --   [place]: [facility] [[id number]]  Level of Care: Progressive [102]  Admit to Progressive based on following criteria: CARDIOVASCULAR & THORACIC of moderate stability with acute coronary syndrome symptoms/low risk myocardial infarction/hypertensive urgency/arrhythmias/heart failure potentially compromising stability and stable post cardiovascular intervention patients.  May admit patient to [facility] or [facility] if  equivalent level of care is available:: Yes  Covid Evaluation: Confirmed COVID Negative  Diagnosis: CHF (congestive heart failure) [facility]) [id number]  Admitting Physician: Clance Boll [id number]  Attending Physician: Clance Boll [id number]  Certification:: I certify this patient will need inpatient services for at least 2 midnights  Estimated Length of Stay: 3          Medical History Past Medical History:  Diagnosis Date   AKI (acute kidney injury) ([place]) 03/22/2014   Anemia    Arthritis    ATN (acute tubular necrosis) ([place]) 03/22/2014   Cataract    Heart murmur    Hypertension    Lactic acidosis 03/22/2014   PONV (postoperative nausea and vomiting)    Pre-diabetes    Sepsis due to group B Streptococcus ([place]) 04/14/2014   Severe sepsis with acute organ dysfunction ([place]) 03/22/2014   Status post PICC central line placement 04/14/2014   Urinary tract infection 03/22/2014    Allergies Allergies  Allergen Reactions   Penicillins Hives and Itching    Tolerated Ancef 11/17 and cefepime 02/2017  Has patient had a PCN reaction causing immediate rash, facial/tongue/throat swelling, SOB or lightheadedness with hypotension: Yes Has patient had a PCN reaction causing severe rash involving mucus membranes or skin necrosis: No Has patient had a PCN reaction that required hospitalization: No Has patient had a PCN reaction occurring within the last 10 years:  # # YES # #  If all of the above answers are "NO", then may proceed with Cephalosporin use.  Chocolate Other (See Comments)    Migraines    Tape Itching   Ampicillin Hives and Itching    IV Location/Drains/Wounds Patient Lines/Drains/Airways Status     Active Line/Drains/Airways     Name Placement date Placement time Site Days   Peripheral IV 01/01/23 22 G 1" Anterior;Distal;Left Forearm 01/01/23  0908  Forearm  less than 1   Negative Pressure Wound Therapy Knee Right 09/18/16  1845  --  2296   Negative Pressure  Wound Therapy Knee Right 11/24/16  --  --  2229   Incision (Closed) 09/18/16 Knee Right 09/18/16  1653  -- 2296   Incision (Closed) 11/24/16 Leg Right 11/24/16  1123  -- 2229   Wound / Incision (Open or Dehisced) 02/22/17 Other (Comment) Thigh Right;Upper 02/22/17  --  Thigh  2139            Labs/Imaging Results for orders placed or performed during the hospital encounter of 01/01/23 (from the past 48 hour(s))  Resp panel by RT-PCR (RSV, Flu A&B, Covid) Anterior Nasal Swab     Status: None   Collection Time: 01/01/23  9:23 AM   Specimen: Anterior Nasal Swab  Result Value Ref Range   SARS Coronavirus 2 by RT PCR NEGATIVE NEGATIVE    Comment: (NOTE) SARS-CoV-2 target nucleic acids are NOT DETECTED.  The SARS-CoV-2 RNA is generally detectable in upper respiratory specimens during the acute phase of infection. The lowest concentration of SARS-CoV-2 viral copies this assay can detect is 138 copies/mL. A negative result does not preclude SARS-Cov-2 infection and should not be used as the sole basis for treatment or other patient management decisions. A negative result may occur with  improper specimen collection/handling, submission of specimen other than nasopharyngeal swab, presence of viral mutation(s) within the areas targeted by this assay, and inadequate number of viral copies(<138 copies/mL). A negative result must be combined with clinical observations, patient history, and epidemiological information. The expected result is Negative.  Fact Sheet for Patients:  EntrepreneurPulse.com.au  Fact Sheet for Healthcare Providers:  IncredibleEmployment.be  This test is no t yet approved or cleared by the [place] FDA and  has been authorized for detection and/or diagnosis of SARS-CoV-2 by FDA under an Emergency Use Authorization (EUA). This EUA will remain  in effect (meaning this test can be used) for the duration of the COVID-19  declaration under Section 564(b)(1) of the Act, 21 U.S.C.section 360bbb-3(b)(1), unless the authorization is terminated  or revoked sooner.       Influenza A by PCR NEGATIVE NEGATIVE   Influenza B by PCR NEGATIVE NEGATIVE    Comment: (NOTE) The Xpert Xpress SARS-CoV-2/FLU/RSV plus assay is intended as an aid in the diagnosis of influenza from Nasopharyngeal swab specimens and should not be used as a sole basis for treatment. Nasal washings and aspirates are unacceptable for Xpert Xpress SARS-CoV-2/FLU/RSV testing.  Fact Sheet for Patients: EntrepreneurPulse.com.au  Fact Sheet for Healthcare Providers: IncredibleEmployment.be  This test is not yet approved or cleared by the [place] FDA and has been authorized for detection and/or diagnosis of SARS-CoV-2 by FDA under an Emergency Use Authorization (EUA). This EUA will remain in effect (meaning this test can be used) for the duration of the COVID-19 declaration under Section 564(b)(1) of the Act, 21 U.S.C. section 360bbb-3(b)(1), unless the authorization is terminated or revoked.     Resp Syncytial Virus by PCR NEGATIVE NEGATIVE    Comment: (NOTE) Fact Sheet for Patients: EntrepreneurPulse.com.au  Fact Sheet for Healthcare Providers:  IncredibleEmployment.be  This test is not yet approved or cleared by the [place] and has been authorized for detection and/or diagnosis of SARS-CoV-2 by FDA under an Emergency Use Authorization (EUA). This EUA will remain in effect (meaning this test can be used) for the duration of the COVID-19 declaration under Section 564(b)(1) of the Act, 21 U.S.C. section 360bbb-3(b)(1), unless the authorization is terminated or revoked.  Performed at [facility], [place] [address], [place], [place] [address]   CBC     Status: Abnormal   Collection Time: 01/01/23  9:55 AM  Result Value Ref Range    WBC 10.6 (H) 4.0 - 10.5 K/uL   RBC 3.76 (L) 3.87 - 5.11 MIL/uL   Hemoglobin 10.9 (L) 12.0 - 15.0 g/dL   HCT 33.8 (L) 36.0 - 46.0 %   MCV 89.9 80.0 - 100.0 fL   MCH 29.0 26.0 - 34.0 pg   MCHC 32.2 30.0 - 36.0 g/dL   RDW 16.1 (H) 11.5 - 15.5 %   Platelets 247 150 - 400 K/uL   nRBC 0.0 0.0 - 0.2 %    Comment: Performed at [facility], [place] [address] [place], [place] [address]  Brain natriuretic peptide     Status: Abnormal   Collection Time: 01/01/23  9:55 AM  Result Value Ref Range   B Natriuretic Peptide 1,209.2 (H) 0.0 - 100.0 pg/mL    Comment: Performed at [facility], [place] [address], [place], [place] [address]  Comprehensive metabolic panel     Status: Abnormal   Collection Time: 01/01/23  9:55 AM  Result Value Ref Range   Sodium 131 (L) 135 - 145 mmol/L   Potassium 4.7 3.5 - 5.1 mmol/L   Chloride 98 98 - 111 mmol/L   CO2 25 22 - 32 mmol/L   Glucose, Bld 96 70 - 99 mg/dL    Comment: Glucose reference range applies only to samples taken after fasting for at least 8 hours.   BUN 36 (H) 8 - 23 mg/dL   Creatinine, Ser 0.98 0.44 - 1.00 mg/dL   Calcium 8.2 (L) 8.9 - 10.3 mg/dL   Total Protein 5.6 (L) 6.5 - 8.1 g/dL   Albumin 3.1 (L) 3.5 - 5.0 g/dL   AST 16 15 - 41 U/L   ALT 11 0 - 44 U/L   Alkaline Phosphatase 48 38 - 126 U/L   Total Bilirubin 0.7 0.3 - 1.2 mg/dL   GFR, Estimated 54 (L) >60 mL/min    Comment: (NOTE) Calculated using the CKD-EPI Creatinine Equation (2021)    Anion gap 8 5 - 15    Comment: Performed at [facility], [place] [address], [place], [place] [address]  Troponin I (High Sensitivity)     Status: Abnormal   Collection Time: 01/01/23  9:55 AM  Result Value Ref Range   Troponin I (High Sensitivity) 554 (HH) <18 ng/L    Comment: CRITICAL RESULT CALLED TO, READ BACK BY AND VERIFIED WITH DOWD, P RN @ 1112 ON 01/01/2023 BY Jason Fila, K (NOTE) Elevated high sensitivity troponin I (hsTnI) values and  significant  changes across serial measurements may suggest ACS but many other  chronic and acute conditions are known to elevate hsTnI results.  Refer to the "Links" section for chest pain algorithms and additional  guidance. Performed at [facility], [place] [address] [place], [place] [address]   Troponin I (High Sensitivity)     Status: Abnormal  Collection Time: 01/01/23 12:31 PM  Result Value Ref Range   Troponin I (High Sensitivity) 506 (HH) <18 ng/L    Comment: CRITICAL RESULT CALLED TO, READ BACK BY AND VERIFIED WITH TEETER, Z @ 1329 ON 01/01/2023 BY XIONG, K (NOTE) Elevated high sensitivity troponin I (hsTnI) values and significant  changes across serial measurements may suggest ACS but many other  chronic and acute conditions are known to elevate hsTnI results.  Refer to the "Links" section for chest pain algorithms and additional  guidance. Performed at [facility], [place] [address] [place], [place] [address]    ECHOCARDIOGRAM COMPLETE  Result Date: 01/01/2023    ECHOCARDIOGRAM REPORT   Patient Name:   Dawn Lawson Date of Exam: 01/01/2023 Medical Rec #:  [id number]      Height:       62.0 in Accession #:    [id number]     Weight:       190.0 lb Date of Birth:  06-21-1929      BSA:          1.870 m Patient Age:    68 years       BP:           117/41 mmHg Patient Gender: F              HR:           48 bpm. Exam Location:  Inpatient Procedure: 2D Echo, Cardiac Doppler and Color Doppler Indications:    elevated troponin  History:        Patient has prior history of Echocardiogram examinations. Risk                 Factors:Hypertension.  Sonographer:    Phineas Douglas Referring Phys: [place]  1. Left ventricular ejection fraction, by estimation, is 60 to 65%. The left ventricle has normal function. The left ventricle has no regional wall motion abnormalities. There is mild concentric left ventricular hypertrophy. Left  ventricular diastolic function could not be evaluated.  2. Right ventricular systolic function is mildly reduced. The right ventricular size is moderately enlarged. There is moderately elevated pulmonary artery systolic pressure. The estimated right ventricular systolic pressure is 0000000 mmHg.  3. Left atrial size was severely dilated.  4. Right atrial size was severely dilated.  5. The mitral valve is normal in structure. Mild to moderate mitral valve regurgitation. No evidence of mitral stenosis.  6. Tricuspid valve regurgitation is moderate.  7. The aortic valve is tricuspid. There is mild calcification of the aortic valve. Aortic valve regurgitation is mild to moderate. Aortic valve sclerosis/calcification is present, without any evidence of aortic stenosis. Aortic regurgitation PHT measures 426 msec.  8. Aortic dilatation noted. There is borderline dilatation of the ascending aorta, measuring 37 mm.  9. The inferior vena cava is dilated in size with <50% respiratory variability, suggesting right atrial pressure of 15 mmHg. FINDINGS  Left Ventricle: Left ventricular ejection fraction, by estimation, is 60 to 65%. The left ventricle has normal function. The left ventricle has no regional wall motion abnormalities. The left ventricular internal cavity size was normal in size. There is  mild concentric left ventricular hypertrophy. Left ventricular diastolic function could not be evaluated due to atrial fibrillation. Left ventricular diastolic function could not be evaluated. Right Ventricle: The right ventricular size is moderately enlarged. No increase in right ventricular wall thickness. Right ventricular systolic function is mildly reduced. There is moderately elevated pulmonary artery  systolic pressure. The tricuspid regurgitant velocity is 2.74 m/s, and with an assumed right atrial pressure of 15 mmHg, the estimated right ventricular systolic pressure is 0000000 mmHg. Left Atrium: Left atrial size was severely  dilated. Right Atrium: Right atrial size was severely dilated. Pericardium: There is no evidence of pericardial effusion. Mitral Valve: The mitral valve is normal in structure. Mild to moderate mitral valve regurgitation. No evidence of mitral valve stenosis. Tricuspid Valve: The tricuspid valve is normal in structure. Tricuspid valve regurgitation is moderate . No evidence of tricuspid stenosis. Aortic Valve: The aortic valve is tricuspid. There is mild calcification of the aortic valve. Aortic valve regurgitation is mild to moderate. Aortic regurgitation PHT measures 426 msec. Aortic valve sclerosis/calcification is present, without any evidence of aortic stenosis. Pulmonic Valve: The pulmonic valve was normal in structure. Pulmonic valve regurgitation is not visualized. No evidence of pulmonic stenosis. Aorta: Aortic dilatation noted. There is borderline dilatation of the ascending aorta, measuring 37 mm. Venous: The inferior vena cava is dilated in size with less than 50% respiratory variability, suggesting right atrial pressure of 15 mmHg. IAS/Shunts: No atrial level shunt detected by color flow Doppler. Additional Comments: There is pleural effusion in the left lateral region.  LEFT VENTRICLE PLAX 2D LVIDd:         3.80 cm     Diastology LVIDs:         2.30 cm     LV e' medial:    7.29 cm/s LV PW:         1.20 cm     LV E/e' medial:  16.3 LV IVS:        1.20 cm     LV e' lateral:   8.27 cm/s LVOT diam:     1.80 cm     LV E/e' lateral: 14.4 LV SV:         54 LV SV Index:   29 LVOT Area:     2.54 cm  LV Volumes (MOD) LV vol d, MOD A2C: 84.5 ml LV vol d, MOD A4C: 96.2 ml LV vol s, MOD A2C: 29.1 ml LV vol s, MOD A4C: 33.5 ml LV SV MOD A2C:     55.4 ml LV SV MOD A4C:     96.2 ml LV SV MOD BP:      61.8 ml RIGHT VENTRICLE             IVC RV Basal diam:  5.50 cm     IVC diam: 2.70 cm RV S prime:     10.70 cm/s TAPSE (M-mode): 2.1 cm LEFT ATRIUM              Index        RIGHT ATRIUM           Index LA diam:         4.10 cm  2.19 cm/m   RA Area:     34.20 cm LA Vol (A2C):   88.6 ml  47.37 ml/m  RA Volume:   119.00 ml 63.62 ml/m LA Vol (A4C):   101.0 ml 54.00 ml/m LA Biplane Vol: 96.5 ml  51.59 ml/m  AORTIC VALVE LVOT Vmax:   84.50 cm/s LVOT Vmean:  57.800 cm/s LVOT VTI:    0.213 m AI PHT:      426 msec  AORTA Ao Root diam: 3.40 cm Ao Asc diam:  3.70 cm MITRAL VALVE  TRICUSPID VALVE MV Area (PHT): 4.19 cm     TR Peak grad:   30.0 mmHg MV Decel Time: 181 msec     TR Vmax:        274.00 cm/s MR Peak grad: 80.3 mmHg MR Mean grad: 53.0 mmHg     SHUNTS MR Vmax:      448.00 cm/s   Systemic VTI:  0.21 m MR Vmean:     349.0 cm/s    Systemic Diam: 1.80 cm MV E velocity: 119.00 cm/s Glori Bickers MD Electronically signed by Glori Bickers MD Signature Date/Time: 01/01/2023/2:05:56 PM    Final    DG Chest Port 1 View  Result Date: 01/01/2023 CLINICAL DATA:  Weakness, losing strength and mobility slowly over several weeks now, sharper decline last week EXAM: PORTABLE CHEST 1 VIEW COMPARISON:  Portable exam 0904 hours compared to 07/16/2019 FINDINGS: Enlargement of cardiac silhouette with pulmonary vascular congestion. Atherosclerotic calcification aorta. BILATERAL interstitial infiltrates consistent with pulmonary edema and CHF. LEFT pleural effusion and basilar atelectasis. No pneumothorax. Bones demineralized with advanced BILATERAL glenohumeral degenerative changes and associated calcified loose bodies. IMPRESSION: CHF with small LEFT pleural effusion. Electronically Signed   By: Lavonia Dana M.D.   On: 01/01/2023 09:49    Pending Labs Unresulted Labs (From admission, onward)     Start     Ordered   01/01/23 0923  Urinalysis, Routine w reflex microscopic -Urine, Clean Catch  ONCE - STAT,   URGENT       Question:  Specimen Source  Answer:  Urine, Clean Catch   01/01/23 0923            Vitals/Pain Today's Vitals   01/01/23 0819 01/01/23 0820 01/01/23 0821 01/01/23 1233  BP:  (!) 129/50  (!)  103/47  Pulse:  69  (!) 58  Resp:  (!) 22  (!) 23  Temp:  (!) 97.5 F (36.4 C)  97.8 F (36.6 C)  TempSrc:  Oral  Oral  SpO2: 98% 98%  96%  Height:   '5\' 2"'$  (1.575 m)   PainSc:   0-No pain     Isolation Precautions No active isolations  Medications Medications  lactated ringers bolus 500 mL (500 mLs Intravenous New Bag/Given 01/01/23 0953)  aspirin chewable tablet 324 mg (324 mg Oral Given 01/01/23 1410)  furosemide (LASIX) injection 20 mg (20 mg Intravenous Given 01/01/23 1412)    Mobility non-ambulatory

## 2023-01-01 NOTE — Progress Notes (Signed)
ANTICOAGULATION CONSULT NOTE - Initial Consult  Pharmacy Consult for heparin Indication: atrial fibrillation  Allergies  Allergen Reactions   Penicillins Hives and Itching    Tolerated Ancef 11/17 and cefepime 02/2017  Has patient had a PCN reaction causing immediate rash, facial/tongue/throat swelling, SOB or lightheadedness with hypotension: Yes Has patient had a PCN reaction causing severe rash involving mucus membranes or skin necrosis: No Has patient had a PCN reaction that required hospitalization: No Has patient had a PCN reaction occurring within the last 10 years:  # # YES # #  If all of the above answers are "NO", then may proceed with Cephalosporin use.   Chocolate Other (See Comments)    Migraines    Tape Itching   Ampicillin Hives and Itching    Patient Measurements: Height: '5\' 2"'$  (157.5 cm) IBW/kg (Calculated) : 50.1 Total Body Weight: 80.5 kg Heparin Dosing Weight: 68 kg  Vital Signs: Temp: 97.8 F (36.6 C) (03/11 1233) Temp Source: Oral (03/11 1233) BP: 103/47 (03/11 1233) Pulse Rate: 58 (03/11 1233)  Labs: Recent Labs    01/01/23 0955  HGB 10.9*  HCT 33.8*  PLT 247  CREATININE 0.98  TROPONINIHS 554*    CrCl cannot be calculated (Unknown ideal weight.).   Medical History: Past Medical History:  Diagnosis Date   AKI (acute kidney injury) ([place]) 03/22/2014   Anemia    Arthritis    ATN (acute tubular necrosis) ([facility]) 03/22/2014   Cataract    Heart murmur    Hypertension    Lactic acidosis 03/22/2014   PONV (postoperative nausea and vomiting)    Pre-diabetes    Sepsis due to group B Streptococcus ([place]) 04/14/2014   Severe sepsis with acute organ dysfunction ([place]) 03/22/2014   Status post PICC central line placement 04/14/2014   Urinary tract infection 03/22/2014    Medications: Not prescribed anticoagulants PTA  Assessment: Pt is a 74 yoF presenting with SOB. EKG obtained - pharmacy consulted to dose heparin for atrial fibrillation.   Today,  01/01/23 CBC: Hgb slightly low, Plt WNL SCr WNL  Goal of Therapy:  Heparin level 0.3-0.7 units/ml Monitor platelets by anticoagulation protocol: Yes   Plan:  Heparin bolus of 3000 units IV once Initiate heparin infusion at 1000 units/hr Check 8 hour HL CBC, heparin level daily while on heparin Monitor for signs of bleeding  Lenis Noon, PharmD 01/01/2023,12:38 PM

## 2023-01-01 NOTE — H&P (Addendum)
History and Physical    Dawn [facility] [id number] DOB: 1929/09/19 DOA: 01/01/2023  PCP: [name], NP  Patient coming from: home  I have personally briefly reviewed patient's old medical records in [facility]  Chief Complaint: weakness/fatigue / sob cough x 3-4 days progressive  HPI: Dawn [facility] is a 89 y.o. female with medical history significant of Pre-diabetes, arthritis, Hypertensive heart disease, Grade II diastolic dysfunction in setting of moderate asymmetric left ventricular hypertrophy of the basal-septal segment with EF of 65% noted on echo 2021, with in history of chronic b/l lower extremity swelling and interim history of one month of  progressive weakness . Patient presents to ED brought in by family  due to progression of symptoms and now new symptoms of sob/cough x 3-4 days which has progressed in severity. Patient denies any chest pain , n/v/diarrhea/dysuria/ abdominal pain.    ED Course:  Afeb, bp 129/50, r22, sat 98%  Resp pane: neg  UA neg  CXR: IMPRESSION: CHF with small LEFT pleural effusion.   Wbc 10.6, hgb 10.9,plt 247  N A 131( 137), K 4.7, cr0.98 Cr 554,506 BNP 1209 Echo ef 65, right and left atria severe dilation Increase right sided pressures   Tx asa , lasix, heparin drip Review of Systems: As per HPI otherwise 10 point review of systems negative.   Past Medical History:  Diagnosis Date   AKI (acute kidney injury) ([place]) 03/22/2014   Anemia    Arthritis    ATN (acute tubular necrosis) ([facility]) 03/22/2014   Cataract    Heart murmur    Hypertension    Lactic acidosis 03/22/2014   PONV (postoperative nausea and vomiting)    Pre-diabetes    Sepsis due to group B Streptococcus ([facility]) 04/14/2014   Severe sepsis with acute organ dysfunction ([facility]) 03/22/2014   Status post PICC central line placement 04/14/2014   Urinary tract infection 03/22/2014    Past Surgical History:  Procedure Laterality Date   ABDOMINAL HYSTERECTOMY     partial    APPENDECTOMY     APPLICATION OF WOUND VAC Right 09/18/2016   Procedure: APPLICATION OF WOUND VAC;  Surgeon: Rod Can, MD;  Location: [place];  Service: Orthopedics;  Laterality: Right;   EXCISIONAL TOTAL KNEE ARTHROPLASTY WITH ANTIBIOTIC SPACERS Right 09/18/2016   Procedure: RESECTION OF RIGHT TOTAL KNEE ARTHROPLASTY PLACEMENT OF ANTIBIOTIC SPACER;  Surgeon: Rod Can, MD;  Location: [place];  Service: Orthopedics;  Laterality: Right;   EYE SURGERY     Lens implants   JOINT REPLACEMENT Right    KNEE   KNEE HARDWARE REMOVAL Right 11/24/2016   RECTOCELE REPAIR     TEE WITHOUT CARDIOVERSION N/A 04/09/2014   Procedure: TRANSESOPHAGEAL ECHOCARDIOGRAM (TEE);  Surgeon: Dawn Perla, MD;  Location: [facility];  Service: Cardiovascular;  Laterality: N/A;   TOTAL KNEE ARTHROPLASTY WITH REVISION COMPONENTS Right 11/24/2016   Procedure: Removal of right knee spacer, Right knee arthrodesis;  Surgeon: Rod Can, MD;  Location: [facility];  Service: Orthopedics;  Laterality: Right;     reports that she quit smoking about 46 years ago. Her smoking use included cigarettes. She has a 2.00 pack-year smoking history. She has never used smokeless tobacco. She reports that she does not drink alcohol and does not use drugs.  Allergies  Allergen Reactions   Penicillins Hives and Itching    Tolerated Ancef 11/17 and cefepime 02/2017  Has patient had a PCN reaction causing immediate rash, facial/tongue/throat swelling, SOB or lightheadedness with  hypotension: Yes Has patient had a PCN reaction causing severe rash involving mucus membranes or skin necrosis: No Has patient had a PCN reaction that required hospitalization: No Has patient had a PCN reaction occurring within the last 10 years:  # # YES # #  If all of the above answers are "NO", then may proceed with Cephalosporin use.   Chocolate Other (See Comments)    Migraines    Tape Itching   Ampicillin Hives and Itching    Family History   Problem Relation Age of Onset   Heart disease Mother    Cancer Mother        colon   Heart attack Father     Prior to Admission medications   Medication Sig Start Date End Date Taking? Authorizing Provider  acetaminophen (TYLENOL) 500 MG tablet Take 500-1,000 mg by mouth every 6 (six) hours as needed for moderate pain.   Yes [provider]  bisoprolol-hydrochlorothiazide (ZIAC) 5-6.25 MG tablet Take 1 tablet by mouth daily. 07/11/22  Yes Abonza, Maritza, PA-C  cephALEXin (KEFLEX) 500 MG capsule Take 1 capsule (500 mg total) by mouth 2 (two) times daily. 07/25/22  Yes Abonza, Maritza, PA-C  Dextran 70-Hypromellose (ARTIFICIAL TEARS PF OP) Place 1 drop into both eyes as needed (for dry eyes).   Yes [provider]  losartan (COZAAR) 50 MG tablet Take 1 tablet (50 mg total) by mouth daily. 07/11/22  Yes Abonza, Maritza, PA-C  Multiple Vitamins-Minerals (MULTIVITAMIN PO) Take 1 tablet by mouth daily.   Yes [provider]  Multiple Vitamins-Minerals (PRESERVISION AREDS PO) Take 1 capsule by mouth 2 (two) times daily.   Yes [provider]  rosuvastatin (CRESTOR) 5 MG tablet TAKE 1 TABLET BY MOUTH AT BEDTIME EVERY MONDAY, WEDNESDAY, AND FRIDAY NIGHT. Patient taking differently: Take 5 mg by mouth every Monday, Wednesday, and Friday. 01/01/23  Yes Dawn Harman, PA    Physical Exam: Vitals:   01/01/23 0819 01/01/23 0820 01/01/23 0821 01/01/23 1233  BP:  (!) 129/50  (!) 103/47  Pulse:  69  (!) 58  Resp:  (!) 22  (!) 23  Temp:  (!) 97.5 F (36.4 C)  97.8 F (36.6 C)  TempSrc:  Oral  Oral  SpO2: 98% 98%  96%  Height:   '5\' 2"'$  (1.575 m)     Constitutional: NAD, calm, comfortable Vitals:   01/01/23 0819 01/01/23 0820 01/01/23 0821 01/01/23 1233  BP:  (!) 129/50  (!) 103/47  Pulse:  69  (!) 58  Resp:  (!) 22  (!) 23  Temp:  (!) 97.5 F (36.4 C)  97.8 F (36.6 C)  TempSrc:  Oral  Oral  SpO2: 98% 98%  96%  Height:   '5\' 2"'$  (1.575 m)    Eyes:  PERRL, lids and conjunctivae normal ENMT: Mucous membranes are moist. Posterior pharynx clear of any exudate or lesions.Normal dentition.  Neck: normal, supple, no masses, no thyromegaly Respiratory: clear to auscultation bilaterally, no wheezing, + crackles. Normal respiratory effort. No accessory muscle use.  Cardiovascular: Regular rate and rhythm, no murmurs / rubs / gallops. + extremity edema. Warm extremities,  Abdomen: no tenderness, no masses palpated. No hepatosplenomegaly. Bowel sounds positive.  Musculoskeletal: no clubbing / cyanosis. No joint deformity upper and lower extremities. Good ROM, no contractures. Normal muscle tone.  Skin: no rashes, lesions, ulcers. No induration Neurologic: CN 2-12 grossly intact. Sensation intact, DTR normal.  MAE x 4 , 4/5  in all ext except right lower  ext Psychiatric: Normal judgment and insight. Alert and oriented x 2. Normal mood.    Labs on Admission: I have personally reviewed following labs and imaging studies  CBC: Recent Labs  Lab 01/01/23 0955  WBC 10.6*  HGB 10.9*  HCT 33.8*  MCV 89.9  PLT [id number]   Basic Metabolic Panel: Recent Labs  Lab 01/01/23 0955  NA 131*  K 4.7  CL 98  CO2 25  GLUCOSE 96  BUN 36*  CREATININE 0.98  CALCIUM 8.2*   GFR: CrCl cannot be calculated (Unknown ideal weight.). Liver Function Tests: Recent Labs  Lab 01/01/23 0955  AST 16  ALT 11  ALKPHOS 48  BILITOT 0.7  PROT 5.6*  ALBUMIN 3.1*   No results for input(s): "LIPASE", "AMYLASE" in the last 168 hours. No results for input(s): "AMMONIA" in the last 168 hours. Coagulation Profile: No results for input(s): "INR", "PROTIME" in the last 168 hours. Cardiac Enzymes: No results for input(s): "CKTOTAL", "CKMB", "CKMBINDEX", "TROPONINI" in the last 168 hours. BNP (last 3 results) No results for input(s): "PROBNP" in the last 8760 hours. HbA1C: No results for input(s): "HGBA1C" in the last 72 hours. CBG: No results for input(s): "GLUCAP" in  the last 168 hours. Lipid Profile: No results for input(s): "CHOL", "HDL", "LDLCALC", "TRIG", "CHOLHDL", "LDLDIRECT" in the last 72 hours. Thyroid Function Tests: No results for input(s): "TSH", "T4TOTAL", "FREET4", "T3FREE", "THYROIDAB" in the last 72 hours. Anemia Panel: No results for input(s): "VITAMINB12", "FOLATE", "FERRITIN", "TIBC", "IRON", "RETICCTPCT" in the last 72 hours. Urine analysis:    Component Value Date/Time   COLORURINE STRAW (A) 02/22/2017 1315   APPEARANCEUR CLEAR 02/22/2017 1315   LABSPEC 1.004 (L) 02/22/2017 1315   PHURINE 7.0 02/22/2017 1315   GLUCOSEU NEGATIVE 02/22/2017 1315   HGBUR MODERATE (A) 02/22/2017 1315   BILIRUBINUR NEGATIVE 02/22/2017 1315   KETONESUR NEGATIVE 02/22/2017 1315   PROTEINUR NEGATIVE 02/22/2017 1315   UROBILINOGEN 0.2 03/22/2014 1944   NITRITE NEGATIVE 02/22/2017 1315   LEUKOCYTESUR NEGATIVE 02/22/2017 1315    Radiological Exams on Admission: DG Chest Port 1 View  Result Date: 01/01/2023 CLINICAL DATA:  Weakness, losing strength and mobility slowly over several weeks now, sharper decline last week EXAM: PORTABLE CHEST 1 VIEW COMPARISON:  Portable exam 0904 hours compared to 07/16/2019 FINDINGS: Enlargement of cardiac silhouette with pulmonary vascular congestion. Atherosclerotic calcification aorta. BILATERAL interstitial infiltrates consistent with pulmonary edema and CHF. LEFT pleural effusion and basilar atelectasis. No pneumothorax. Bones demineralized with advanced BILATERAL glenohumeral degenerative changes and associated calcified loose bodies. IMPRESSION: CHF with small LEFT pleural effusion. Electronically Signed   By: Lavonia Dana M.D.   On: 01/01/2023 09:49    EKG: Independently reviewed.  Assessment/Plan Acute on chronic diastolic heart failure exacerbation -lasix 40 mg iv bid  -start jardiance -cycle ce, check tsh,  -echo completed results notes elevated right sided pressures ,  -ekg without hyperacute st -twave  changes  -cardiology consult in am    New Onset Afib - with slow ventricular response  -heparin drip for now  - await cardiology recs   NSTEMI , presumed type II  -elevated ce 500's -in setting oft chf/ and new onset afib  - echo without wall motion abnormality  -f/u with cardiology  -most likely can d/c heparin in am   Pre-diabetes - check A1c  - iss/fs ,poc per protocol   Hypertension -start low dose carvedilol as bp tolerates  -holding arb due to lower bp  -hold hctz as pt is currently  on lasix   Debility  - age related as well as acute illness - PT/OT when appropriate  -patient uses walker at home , hx of falls   DVT prophylaxis: heparin  Code Status: DNR full/ as discussed per patient wishes in event of cardiac arrest  Family Communication:  Alternate Contact Person   +1 more     Janeka, Monohan (Son) [phone] [facility])   Disposition Plan: patient  expected to be admitted greater than 2 midnights  Consults called: cardiology [facility] Admission status: progressive    Clance Boll MD [facility]  If 7PM-7AM, please contact night-coverage [email or web address] Password [email or web address]  01/01/2023, 1:00 PM

## 2023-01-01 NOTE — ED Notes (Signed)
Multiple attempts made to acquire pts weight in ER. Equipment malfunction make it impossible to get pts weight at this time. Charge made aware of issue.

## 2023-01-01 NOTE — ED Triage Notes (Signed)
Per EMS, pt is coming from home. Family states that the pt has been slowly losing strength and mobility for several weeks now, noticed a sharper decline over the last week. Today pt was unable to stand but did not have any falls. Family also states that the pt has been SOB, EMS found the pt to have RA saturation of 88%. Pt denies any SOB, but does endorse a cough. Pt also denies she has been getting weaker, but does admit being unable to get up today, and unsure why.

## 2023-01-02 ENCOUNTER — Other Ambulatory Visit (HOSPITAL_COMMUNITY): Payer: Medicare Other

## 2023-01-02 DIAGNOSIS — I509 Heart failure, unspecified: Secondary | ICD-10-CM

## 2023-01-02 DIAGNOSIS — D6869 Other thrombophilia: Secondary | ICD-10-CM | POA: Diagnosis not present

## 2023-01-02 DIAGNOSIS — I214 Non-ST elevation (NSTEMI) myocardial infarction: Secondary | ICD-10-CM

## 2023-01-02 DIAGNOSIS — I4819 Other persistent atrial fibrillation: Secondary | ICD-10-CM

## 2023-01-02 DIAGNOSIS — I5031 Acute diastolic (congestive) heart failure: Secondary | ICD-10-CM | POA: Diagnosis not present

## 2023-01-02 DIAGNOSIS — I4891 Unspecified atrial fibrillation: Secondary | ICD-10-CM | POA: Diagnosis not present

## 2023-01-02 DIAGNOSIS — R531 Weakness: Secondary | ICD-10-CM

## 2023-01-02 DIAGNOSIS — R7989 Other specified abnormal findings of blood chemistry: Secondary | ICD-10-CM | POA: Diagnosis not present

## 2023-01-02 LAB — COMPREHENSIVE METABOLIC PANEL
ALT: 13 U/L (ref 0–44)
AST: 14 U/L — ABNORMAL LOW (ref 15–41)
Albumin: 3.4 g/dL — ABNORMAL LOW (ref 3.5–5.0)
Alkaline Phosphatase: 47 U/L (ref 38–126)
Anion gap: 8 (ref 5–15)
BUN: 46 mg/dL — ABNORMAL HIGH (ref 8–23)
CO2: 25 mmol/L (ref 22–32)
Calcium: 8.3 mg/dL — ABNORMAL LOW (ref 8.9–10.3)
Chloride: 95 mmol/L — ABNORMAL LOW (ref 98–111)
Creatinine, Ser: 1.2 mg/dL — ABNORMAL HIGH (ref 0.44–1.00)
GFR, Estimated: 43 mL/min — ABNORMAL LOW (ref >60)
Glucose, Bld: 94 mg/dL (ref 70–99)
Potassium: 4.4 mmol/L (ref 3.5–5.1)
Sodium: 128 mmol/L — ABNORMAL LOW (ref 135–145)
Total Bilirubin: 0.7 mg/dL (ref 0.3–1.2)
Total Protein: 5.6 g/dL — ABNORMAL LOW (ref 6.5–8.1)

## 2023-01-02 LAB — PROTIME-INR
INR: 1.2 (ref 0.8–1.2)
Prothrombin Time: 15.3 seconds — ABNORMAL HIGH (ref 11.4–15.2)

## 2023-01-02 LAB — SEDIMENTATION RATE: Sed Rate: 6 mm/hr (ref 0–22)

## 2023-01-02 LAB — LACTIC ACID, PLASMA
Lactic Acid, Venous: 1.5 mmol/L (ref 0.5–1.9)
Lactic Acid, Venous: 1.9 mmol/L (ref 0.5–1.9)

## 2023-01-02 LAB — HEPARIN LEVEL (UNFRACTIONATED)
Heparin Unfractionated: 0.29 IU/mL — ABNORMAL LOW (ref 0.30–0.70)
Heparin Unfractionated: 0.56 IU/mL (ref 0.30–0.70)
Heparin Unfractionated: 0.54 IU/mL (ref 0.30–0.70)

## 2023-01-02 LAB — GLUCOSE, CAPILLARY
Glucose-Capillary: 62 mg/dL — ABNORMAL LOW (ref 70–99)
Glucose-Capillary: 116 mg/dL — ABNORMAL HIGH (ref 70–99)
Glucose-Capillary: 112 mg/dL — ABNORMAL HIGH (ref 70–99)
Glucose-Capillary: 130 mg/dL — ABNORMAL HIGH (ref 70–99)

## 2023-01-02 LAB — CBC
HCT: 31.7 % — ABNORMAL LOW (ref 36.0–46.0)
Hemoglobin: 10.3 g/dL — ABNORMAL LOW (ref 12.0–15.0)
MCH: 28.8 pg (ref 26.0–34.0)
MCHC: 32.5 g/dL (ref 30.0–36.0)
MCV: 88.5 fL (ref 80.0–100.0)
Platelets: 247 10*3/uL (ref 150–400)
RBC: 3.58 MIL/uL — ABNORMAL LOW (ref 3.87–5.11)
RDW: 15.9 % — ABNORMAL HIGH (ref 11.5–15.5)
WBC: 13.1 10*3/uL — ABNORMAL HIGH (ref 4.0–10.5)
nRBC: 0 % (ref 0.0–0.2)

## 2023-01-02 LAB — C-REACTIVE PROTEIN: CRP: <0.5 mg/dL (ref <1.0)

## 2023-01-02 MED ORDER — FUROSEMIDE 10 MG/ML IJ SOLN
40.0000 mg | Freq: Once | INTRAMUSCULAR | Status: AC
  Administered 2023-01-02: 40 mg via INTRAVENOUS
  Filled 2023-01-02: qty 4

## 2023-01-02 MED ORDER — FUROSEMIDE 10 MG/ML IJ SOLN
80.0000 mg | Freq: Once | INTRAMUSCULAR | Status: AC
  Administered 2023-01-02: 80 mg via INTRAVENOUS
  Filled 2023-01-02: qty 8

## 2023-01-02 MED ORDER — FUROSEMIDE 10 MG/ML IJ SOLN
20.0000 mg | Freq: Every day | INTRAMUSCULAR | Status: DC
  Administered 2023-01-02: 20 mg via INTRAVENOUS
  Filled 2023-01-02: qty 2

## 2023-01-02 MED ORDER — FUROSEMIDE 10 MG/ML IJ SOLN
80.0000 mg | Freq: Two times a day (BID) | INTRAMUSCULAR | Status: DC
  Administered 2023-01-03 – 2023-01-04 (×3): 80 mg via INTRAVENOUS
  Filled 2023-01-02 (×3): qty 8

## 2023-01-02 MED ORDER — CEPHALEXIN 500 MG PO CAPS
500.0000 mg | ORAL_CAPSULE | Freq: Two times a day (BID) | ORAL | Status: DC
  Administered 2023-01-02 – 2023-01-08 (×12): 500 mg via ORAL
  Filled 2023-01-02 (×12): qty 1

## 2023-01-02 NOTE — Progress Notes (Signed)
ANTICOAGULATION CONSULT NOTE - Follow Up Consult  Pharmacy Consult for Heparin Indication: atrial fibrillation  Allergies  Allergen Reactions   Penicillins Hives and Itching    Tolerated Ancef 11/17 and cefepime 02/2017  Has patient had a PCN reaction causing immediate rash, facial/tongue/throat swelling, SOB or lightheadedness with hypotension: Yes Has patient had a PCN reaction causing severe rash involving mucus membranes or skin necrosis: No Has patient had a PCN reaction that required hospitalization: No Has patient had a PCN reaction occurring within the last 10 years:  # # YES # #  If all of the above answers are "NO", then may proceed with Cephalosporin use.   Chocolate Other (See Comments)    Migraines    Tape Itching   Ampicillin Hives and Itching    Patient Measurements: Height: '5\' 2"'$  (157.5 cm) Weight: 80.5 kg (177 lb 6.4 oz) IBW/kg (Calculated) : 50.1 Heparin Dosing Weight:  68 kg  Vital Signs: BP: 108/37 (03/12 1405) Pulse Rate: 62 (03/12 1405)  Labs: Recent Labs    01/01/23 0955 01/01/23 1231 01/02/23 0058 01/02/23 1107 01/02/23 1909  HGB 10.9*  --  10.3*  --   --   HCT 33.8*  --  31.7*  --   --   PLT 247  --  247  --   --   LABPROT  --   --  15.3*  --   --   INR  --   --  1.2  --   --   HEPARINUNFRC  --   --  0.29* 0.56 0.54  CREATININE 0.98  --  1.20*  --   --   TROPONINIHS 554* 506*  --   --   --     Estimated Creatinine Clearance: 30 mL/min (A) (by C-G formula based on SCr of 1.2 mg/dL (H)).  Assessment: Afib with CHADS2VASC 5. Hep level 0.54 remains in goal range.  Goal of Therapy:  Heparin level 0.3-0.7 units/ml Monitor platelets by anticoagulation protocol: Yes   Plan:  Con't IV heparin at 1100 units/hr Daily HL and CBC Decision pending regarding [place]. Alford Highland, PharmD, BCPS Clinical Staff Pharmacist [email or web address] Alford Highland, Delancey Moraes Stillinger 01/02/2023,7:39 PM

## 2023-01-02 NOTE — Progress Notes (Signed)
[place] for heparin Indication: atrial fibrillation  Allergies  Allergen Reactions   Penicillins Hives and Itching    Tolerated Ancef 11/17 and cefepime 02/2017  Has patient had a PCN reaction causing immediate rash, facial/tongue/throat swelling, SOB or lightheadedness with hypotension: Yes Has patient had a PCN reaction causing severe rash involving mucus membranes or skin necrosis: No Has patient had a PCN reaction that required hospitalization: No Has patient had a PCN reaction occurring within the last 10 years:  # # YES # #  If all of the above answers are "NO", then may proceed with Cephalosporin use.   Chocolate Other (See Comments)    Migraines    Tape Itching   Ampicillin Hives and Itching    Patient Measurements: Height: '5\' 2"'$  (157.5 cm) Weight: 80.5 kg (177 lb 6.4 oz) IBW/kg (Calculated) : 50.1 Total Body Weight: 80.5 kg Heparin Dosing Weight: 68 kg  Vital Signs: Temp: 98.2 F (36.8 C) (03/11 2241) Temp Source: Oral (03/11 2241) BP: 130/55 (03/11 2241) Pulse Rate: 69 (03/11 2241)  Labs: Recent Labs    01/01/23 0955 01/01/23 1231 01/02/23 0058  HGB 10.9*  --  10.3*  HCT 33.8*  --  31.7*  PLT 247  --  247  LABPROT  --   --  15.3*  INR  --   --  1.2  HEPARINUNFRC  --   --  0.29*  CREATININE 0.98  --  1.20*  TROPONINIHS 554* 506*  --      Estimated Creatinine Clearance: 30 mL/min (A) (by C-G formula based on SCr of 1.2 mg/dL (H)).   Medical History: Past Medical History:  Diagnosis Date   AKI (acute kidney injury) ([place]) 03/22/2014   Anemia    Arthritis    ATN (acute tubular necrosis) ([facility]) 03/22/2014   Cataract    Heart murmur    Hypertension    Lactic acidosis 03/22/2014   PONV (postoperative nausea and vomiting)    Pre-diabetes    Sepsis due to group B Streptococcus ([place]) 04/14/2014   Severe sepsis with acute organ dysfunction ([place]) 03/22/2014   Status post PICC central line placement 04/14/2014    Urinary tract infection 03/22/2014    Medications: Not prescribed anticoagulants PTA  Assessment: Pt is a 20 yoF presenting with SOB. EKG obtained - pharmacy consulted to dose heparin for atrial fibrillation.  Baseline labs: CBC: Hgb slightly low, Plt WNL; SCr WNL  01/02/2023: Initial heparin level 0.29- slightly below desired goal range on IV heparin 1000 units/hr CBC: Hg low/stable 10.3; pltc WNL No bleeding or infusion related concerns per RN  Goal of Therapy:  Heparin level 0.3-0.7 units/ml Monitor platelets by anticoagulation protocol: Yes   Plan:  Increase heparin infusion to 1100 units/hr Recheck 8 hour HL after rate increase CBC, heparin level daily while on heparin Monitor for signs of bleeding  Netta Cedars, PharmD 01/02/2023,3:01 AM

## 2023-01-02 NOTE — Progress Notes (Signed)
[place] for heparin Indication: atrial fibrillation  Allergies  Allergen Reactions   Penicillins Hives and Itching    Tolerated Ancef 11/17 and cefepime 02/2017  Has patient had a PCN reaction causing immediate rash, facial/tongue/throat swelling, SOB or lightheadedness with hypotension: Yes Has patient had a PCN reaction causing severe rash involving mucus membranes or skin necrosis: No Has patient had a PCN reaction that required hospitalization: No Has patient had a PCN reaction occurring within the last 10 years:  # # YES # #  If all of the above answers are "NO", then may proceed with Cephalosporin use.   Chocolate Other (See Comments)    Migraines    Tape Itching   Ampicillin Hives and Itching    Patient Measurements: Height: '5\' 2"'$  (157.5 cm) Weight: 80.5 kg (177 lb 6.4 oz) IBW/kg (Calculated) : 50.1 Total Body Weight: 80.5 kg Heparin Dosing Weight: 68 kg  Vital Signs: Temp: 97.9 F (36.6 C) (03/12 0318) Temp Source: Oral (03/12 0318) BP: 117/46 (03/12 0318) Pulse Rate: 75 (03/12 0318)  Labs: Recent Labs    01/01/23 0955 01/01/23 1231 01/02/23 0058 01/02/23 1107  HGB 10.9*  --  10.3*  --   HCT 33.8*  --  31.7*  --   PLT 247  --  247  --   LABPROT  --   --  15.3*  --   INR  --   --  1.2  --   HEPARINUNFRC  --   --  0.29* 0.56  CREATININE 0.98  --  1.20*  --   TROPONINIHS 554* 506*  --   --      Estimated Creatinine Clearance: 30 mL/min (A) (by C-G formula based on SCr of 1.2 mg/dL (H)).   Medical History: Past Medical History:  Diagnosis Date   AKI (acute kidney injury) ([place]) 03/22/2014   Anemia    Arthritis    ATN (acute tubular necrosis) ([facility]) 03/22/2014   Cataract    Heart murmur    Hypertension    Lactic acidosis 03/22/2014   PONV (postoperative nausea and vomiting)    Pre-diabetes    Sepsis due to group B Streptococcus ([place]) 04/14/2014   Severe sepsis with acute organ dysfunction ([place]) 03/22/2014    Status post PICC central line placement 04/14/2014   Urinary tract infection 03/22/2014    Medications: Not prescribed anticoagulants PTA  Assessment: Pt is a 13 yoF presenting with SOB. EKG obtained - pharmacy consulted to dose heparin for atrial fibrillation.  Baseline labs: CBC: Hgb slightly low, Plt WNL; SCr WNL  01/02/2023: Heparin level now therapeutic after increase in heparin IV rate from 1000 to 1100 units/hr early this AM CBC: Hg low/stable 10.3; pltc WNL No bleeding or infusion related concerns per RN  Goal of Therapy:  Heparin level 0.3-0.7 units/ml Monitor platelets by anticoagulation protocol: Yes   Plan:  Continue IV heparin infusion at current rate of 1100 units/hr Check confirmatory 8 hour HL this evening CBC, heparin level daily while on heparin Monitor for signs of bleeding  Kara Mead, PharmD 01/02/2023,12:08 PM

## 2023-01-02 NOTE — Progress Notes (Signed)
Heart Failure Navigator Progress Note  Assessed for Heart & Vascular TOC clinic readiness.  Patient EF 60-65%, per MD note patient with decrease mobility and secondary lifestyle.No HF TOC recommended at this time. .   Navigator will sign off at this time.    Earnestine Leys, BSN, Clinical cytogeneticist Only

## 2023-01-02 NOTE — Consult Note (Addendum)
Cardiology Consultation   Patient ID: Dawn Lawson MRN: [id number]; DOB: 08-27-1929  Admit date: 01/01/2023 Date of Consult: 01/02/2023  PCP:  [name], NP   [place] Prov[facility] Cardiologist:  Freada Bergeron, MD   Patient Profile:   Dawn Lawson is a 89 y.o. female with a hx of pre-diabetes, HTN, grade II DD, and prior bacteremia without endocarditis who is being seen 01/02/2023 for the evaluation of newly recognized Afib at the request of Dr. Eliseo Squires.  History of Present Illness:   Dawn Lawson was seen by Dr. Stanford Breed 2015 for TEE in the setting of E coli UTI and group B strep bacteremia. She had moderate AI and moderate MR, PFO present and no vegetation noted. She has known grade 2 DD with preserved EF (echo 2021). She has not followed with cardiology.   She was BIB EMS from home for weakness and reduced mobility of the last several weeks with a sharp decline over the last week. She was hypoxic and brought to the [facility].   ER workup: Afebrile, 98% on room air, BP 129/50, HR 62  BNP 1210 HS troponin: 554 --> 506 Albumin 3.1 sCr 0.98 --> 1.2 Lactic acid 1.5 Sed rate 13 WNL  EKG with newly recognized atrial fibrillation with slow ventricular rate of 56.   Echo today with LVEF 60-65%, no RWMA, mild LVH, mildly reduced RV, moderately elevated PASP 45 mmHg, severe biatrial enlargement, mild to moderate MR, moderate TR, mild to moderate AI  Son and DIL at bedside and helps with history. She lives alone, but uses a lift chair and does not ambulate unless someone is with her at home. She has a steel rod in her left leg and can't bend her knee. As above, she was unable to ambulate at her baseline prompting ER evaluation. She has been lying flat in bed and spends her days in a recliner. Pt states she currently has chest pain on her left side, but has not been complaining of pain leading up to admission. Patient answers "yes" to all questions.   Family recounts a  fall in January 2024, details and LOC are unknown and patient does not recall. Family denies history of dementia, although her age is recognized.   Past Medical History:  Diagnosis Date   AKI (acute kidney injury) ([place]) 03/22/2014   Anemia    Arthritis    ATN (acute tubular necrosis) ([place]) 03/22/2014   Cataract    Heart murmur    Hypertension    Lactic acidosis 03/22/2014   PONV (postoperative nausea and vomiting)    Pre-diabetes    Sepsis due to group B Streptococcus ([place]) 04/14/2014   Severe sepsis with acute organ dysfunction ([place]) 03/22/2014   Status post PICC central line placement 04/14/2014   Urinary tract infection 03/22/2014    Past Surgical History:  Procedure Laterality Date   ABDOMINAL HYSTERECTOMY     partial   APPENDECTOMY     APPLICATION OF WOUND VAC Right 09/18/2016   Procedure: APPLICATION OF WOUND VAC;  Surgeon: Rod Can, MD;  Location: [facility];  Service: Orthopedics;  Laterality: Right;   EXCISIONAL TOTAL KNEE ARTHROPLASTY WITH ANTIBIOTIC SPACERS Right 09/18/2016   Procedure: RESECTION OF RIGHT TOTAL KNEE ARTHROPLASTY PLACEMENT OF ANTIBIOTIC SPACER;  Surgeon: Rod Can, MD;  Location: [facility];  Service: Orthopedics;  Laterality: Right;   EYE SURGERY     Lens implants   JOINT REPLACEMENT Right    KNEE   KNEE HARDWARE  REMOVAL Right 11/24/2016   RECTOCELE REPAIR     TEE WITHOUT CARDIOVERSION N/A 04/09/2014   Procedure: TRANSESOPHAGEAL ECHOCARDIOGRAM (TEE);  Surgeon: Lelon Perla, MD;  Location: [facility] ENDOSCOPY;  Service: Cardiovascular;  Laterality: N/A;   TOTAL KNEE ARTHROPLASTY WITH REVISION COMPONENTS Right 11/24/2016   Procedure: Removal of right knee spacer, Right knee arthrodesis;  Surgeon: Rod Can, MD;  Location: [facility];  Service: Orthopedics;  Laterality: Right;     Home Medications:  Prior to Admission medications   Medication Sig Start Date End Date Taking? Authorizing Provider  acetaminophen (TYLENOL) 500 MG tablet Take 500-1,000 mg by mouth  every 6 (six) hours as needed for moderate pain.   Yes [provider]  bisoprolol-hydrochlorothiazide (ZIAC) 5-6.25 MG tablet Take 1 tablet by mouth daily. 07/11/22  Yes Abonza, Maritza, PA-C  cephALEXin (KEFLEX) 500 MG capsule Take 1 capsule (500 mg total) by mouth 2 (two) times daily. 07/25/22  Yes Abonza, Maritza, PA-C  Dextran 70-Hypromellose (ARTIFICIAL TEARS PF OP) Place 1 drop into both eyes as needed (for dry eyes).   Yes [provider]  losartan (COZAAR) 50 MG tablet Take 1 tablet (50 mg total) by mouth daily. 07/11/22  Yes Abonza, Maritza, PA-C  Multiple Vitamins-Minerals (MULTIVITAMIN PO) Take 1 tablet by mouth daily.   Yes [provider]  Multiple Vitamins-Minerals (PRESERVISION AREDS PO) Take 1 capsule by mouth 2 (two) times daily.   Yes [provider]  rosuvastatin (CRESTOR) 5 MG tablet TAKE 1 TABLET BY MOUTH AT BEDTIME EVERY MONDAY, WEDNESDAY, AND FRIDAY NIGHT. Patient taking differently: Take 5 mg by mouth every Monday, Wednesday, and Friday. 01/01/23  Yes Velva Harman, PA    Inpatient Medications: Scheduled Meds:  aspirin EC  81 mg Oral Daily   furosemide  20 mg Intravenous Daily   insulin aspart  0-9 Units Subcutaneous TID WC   Continuous Infusions:  heparin 1,100 Units/hr (01/02/23 0308)   PRN Meds: acetaminophen **OR** acetaminophen, albuterol, ondansetron **OR** ondansetron (ZOFRAN) IV  Allergies:    Allergies  Allergen Reactions   Penicillins Hives and Itching    Tolerated Ancef 11/17 and cefepime 02/2017  Has patient had a PCN reaction causing immediate rash, facial/tongue/throat swelling, SOB or lightheadedness with hypotension: Yes Has patient had a PCN reaction causing severe rash involving mucus membranes or skin necrosis: No Has patient had a PCN reaction that required hospitalization: No Has patient had a PCN reaction occurring within the last 10 years:  # # YES # #  If all of the above answers are "NO", then may  proceed with Cephalosporin use.   Chocolate Other (See Comments)    Migraines    Tape Itching   Ampicillin Hives and Itching    Social History:   Social History   Socioeconomic History   Marital status: Widowed    Spouse name: n/a   Number of children: 3   Years of education: Not on file   Highest education level: Not on file  Occupational History   Occupation: retired Biomedical scientist  Tobacco Use   Smoking status: Former    Packs/day: 1.00    Years: 2.00    Total pack years: 2.00    Types: Cigarettes    Quit date: 10/23/1976    Years since quitting: 46.2   Smokeless tobacco: Never  Vaping Use   Vaping Use: Never used  Substance and Sexual Activity   Alcohol use: No    Alcohol/week: 0.0 standard drinks of alcohol  Drug use: No   Sexual activity: Not Currently    Birth control/protection: None  Other Topics Concern   Not on file  Social History Narrative   Lives alone ("with the ghosts and the spiders.").   Her son lives nearby and helps provide care.   One daughter lives in [place], the other in [place].   Social Determinants of Health   Financial Resource Strain: Low Risk  (10/12/2022)   Overall Financial Resource Strain (CARDIA)    Difficulty of Paying Living Expenses: Not hard at all  Food Insecurity: No Food Insecurity (01/01/2023)   Hunger Vital Sign    Worried About Running Out of Food in the Last Year: Never true    Ran Out of Food in the Last Year: Never true  Transportation Needs: No Transportation Needs (01/01/2023)   PRAPARE - Hydrologist (Medical): No    Lack of Transportation (Non-Medical): No  Physical Activity: Sufficiently Active (10/12/2022)   Exercise Vital Sign    Days of Exercise per Week: 5 days    Minutes of Exercise per Session: 50 min  Stress: No Stress Concern Present (10/12/2022)   [place]    Feeling of Stress : Not at all  Social  Connections: Socially Isolated (10/12/2022)   Social Connection and Isolation Panel [NHANES]    Frequency of Communication with Friends and Family: More than three times a week    Frequency of Social Gatherings with Friends and Family: More than three times a week    Attends Religious Services: Never    Marine scientist or Organizations: No    Attends Archivist Meetings: Never    Marital Status: Widowed  Intimate Partner Violence: Not At Risk (01/01/2023)   Humiliation, Afraid, Rape, and Kick questionnaire    Fear of Current or Ex-Partner: No    Emotionally Abused: No    Physically Abused: No    Sexually Abused: No    Family History:    Family History  Problem Relation Age of Onset   Heart disease Mother    Cancer Mother        colon   Heart attack Father      ROS:  Please see the history of present illness.   All other ROS reviewed and negative.     Physical Exam/Data:   Vitals:   01/01/23 2306 01/02/23 0228 01/02/23 0318 01/02/23 0536  BP:   (!) 117/46   Pulse:   75   Resp:   (!) 22   Temp:   97.9 F (36.6 C)   TempSrc:   Oral   SpO2: 100% 98% 100%   Weight:    80.5 kg  Height:        Intake/Output Summary (Last 24 hours) at 01/02/2023 1052 Last data filed at 01/02/2023 0500 Gross per 24 hour  Intake 498.04 ml  Output 850 ml  Net -351.96 ml      01/02/2023    5:36 AM 01/01/2023    3:11 PM 11/27/2022   10:38 AM  Last 3 Weights  Weight (lbs) 177 lb 6.4 oz 177 lb 6.4 oz 190 lb  Weight (kg) 80.468 kg 80.468 kg 86.183 kg     Body mass index is 32.45 kg/m.  General:  elderly female, NAD, [place] in place HEENT: normal Neck: + JVD Cardiac:  irregular rhythm, regular rate, soft murmur Lungs:  diminished in bases, crackles Abd: soft, nontender, no  hepatomegaly  Ext: significant B LE edema, leg pain Skin: warm and dry  Neuro:  CNs 2-12 intact, no focal abnormalities noted Psych:  Normal affect   EKG:  The EKG was personally reviewed and  demonstrates:  atrial fibrillation with VR 54 Telemetry:  Telemetry was personally reviewed and demonstrates:  atrial fibrillation with VR 50-60s  Relevant CV Studies:  Echo 01/01/23: 1. Left ventricular ejection fraction, by estimation, is 60 to 65%. The  left ventricle has normal function. The left ventricle has no regional  wall motion abnormalities. There is mild concentric left ventricular  hypertrophy. Left ventricular diastolic  function could not be evaluated.   2. Right ventricular systolic function is mildly reduced. The right  ventricular size is moderately enlarged. There is moderately elevated  pulmonary artery systolic pressure. The estimated right ventricular  systolic pressure is 0000000 mmHg.   3. Left atrial size was severely dilated.   4. Right atrial size was severely dilated.   5. The mitral valve is normal in structure. Mild to moderate mitral valve  regurgitation. No evidence of mitral stenosis.   6. Tricuspid valve regurgitation is moderate.   7. The aortic valve is tricuspid. There is mild calcification of the  aortic valve. Aortic valve regurgitation is mild to moderate. Aortic valve  sclerosis/calcification is present, without any evidence of aortic  stenosis. Aortic regurgitation PHT  measures 426 msec.   8. Aortic dilatation noted. There is borderline dilatation of the  ascending aorta, measuring 37 mm.   9. The inferior vena cava is dilated in size with <50% respiratory  variability, suggesting right atrial pressure of 15 mmHg.   Laboratory Data:  High Sensitivity Troponin:   Recent Labs  Lab 01/01/23 0955 01/01/23 1231  TROPONINIHS 554* 506*     Chemistry Recent Labs  Lab 01/01/23 0955 01/02/23 0058  NA 131* 128*  K 4.7 4.4  CL 98 95*  CO2 25 25  GLUCOSE 96 94  BUN 36* 46*  CREATININE 0.98 1.20*  CALCIUM 8.2* 8.3*  GFRNONAA 54* 43*  ANIONGAP 8 8    Recent Labs  Lab 01/01/23 0955 01/02/23 0058  PROT 5.6* 5.6*  ALBUMIN 3.1* 3.4*   AST 16 14*  ALT 11 13  ALKPHOS 48 47  BILITOT 0.7 0.7   Lipids No results for input(s): "CHOL", "TRIG", "HDL", "LABVLDL", "LDLCALC", "CHOLHDL" in the last 168 hours.  Hematology Recent Labs  Lab 01/01/23 0955 01/02/23 0058  WBC 10.6* 13.1*  RBC 3.76* 3.58*  HGB 10.9* 10.3*  HCT 33.8* 31.7*  MCV 89.9 88.5  MCH 29.0 28.8  MCHC 32.2 32.5  RDW 16.1* 15.9*  PLT 247 247   Thyroid  Recent Labs  Lab 01/01/23 1645  TSH 1.448    BNP Recent Labs  Lab 01/01/23 0955  BNP 1,209.2*    DDimer No results for input(s): "DDIMER" in the last 168 hours.   Radiology/Studies:  ECHOCARDIOGRAM COMPLETE  Result Date: 01/01/2023    ECHOCARDIOGRAM REPORT   Patient Name:   Dawn Lawson Date of Exam: 01/01/2023 Medical Rec #:  [id number]      Height:       62.0 in Accession #:    [id number]     Weight:       190.0 lb Date of Birth:  Jan 12, 1929      BSA:          1.870 m Patient Age:    81 years       BP:  117/41 mmHg Patient Gender: F              HR:           48 bpm. Exam Location:  Inpatient Procedure: 2D Echo, Cardiac Doppler and Color Doppler Indications:    elevated troponin  History:        Patient has prior history of Echocardiogram examinations. Risk                 Factors:Hypertension.  Sonographer:    Phineas Douglas Referring Phys: [place]  1. Left ventricular ejection fraction, by estimation, is 60 to 65%. The left ventricle has normal function. The left ventricle has no regional wall motion abnormalities. There is mild concentric left ventricular hypertrophy. Left ventricular diastolic function could not be evaluated.  2. Right ventricular systolic function is mildly reduced. The right ventricular size is moderately enlarged. There is moderately elevated pulmonary artery systolic pressure. The estimated right ventricular systolic pressure is 0000000 mmHg.  3. Left atrial size was severely dilated.  4. Right atrial size was severely dilated.  5. The mitral valve is  normal in structure. Mild to moderate mitral valve regurgitation. No evidence of mitral stenosis.  6. Tricuspid valve regurgitation is moderate.  7. The aortic valve is tricuspid. There is mild calcification of the aortic valve. Aortic valve regurgitation is mild to moderate. Aortic valve sclerosis/calcification is present, without any evidence of aortic stenosis. Aortic regurgitation PHT measures 426 msec.  8. Aortic dilatation noted. There is borderline dilatation of the ascending aorta, measuring 37 mm.  9. The inferior vena cava is dilated in size with <50% respiratory variability, suggesting right atrial pressure of 15 mmHg. FINDINGS  Left Ventricle: Left ventricular ejection fraction, by estimation, is 60 to 65%. The left ventricle has normal function. The left ventricle has no regional wall motion abnormalities. The left ventricular internal cavity size was normal in size. There is  mild concentric left ventricular hypertrophy. Left ventricular diastolic function could not be evaluated due to atrial fibrillation. Left ventricular diastolic function could not be evaluated. Right Ventricle: The right ventricular size is moderately enlarged. No increase in right ventricular wall thickness. Right ventricular systolic function is mildly reduced. There is moderately elevated pulmonary artery systolic pressure. The tricuspid regurgitant velocity is 2.74 m/s, and with an assumed right atrial pressure of 15 mmHg, the estimated right ventricular systolic pressure is 0000000 mmHg. Left Atrium: Left atrial size was severely dilated. Right Atrium: Right atrial size was severely dilated. Pericardium: There is no evidence of pericardial effusion. Mitral Valve: The mitral valve is normal in structure. Mild to moderate mitral valve regurgitation. No evidence of mitral valve stenosis. Tricuspid Valve: The tricuspid valve is normal in structure. Tricuspid valve regurgitation is moderate . No evidence of tricuspid stenosis. Aortic  Valve: The aortic valve is tricuspid. There is mild calcification of the aortic valve. Aortic valve regurgitation is mild to moderate. Aortic regurgitation PHT measures 426 msec. Aortic valve sclerosis/calcification is present, without any evidence of aortic stenosis. Pulmonic Valve: The pulmonic valve was normal in structure. Pulmonic valve regurgitation is not visualized. No evidence of pulmonic stenosis. Aorta: Aortic dilatation noted. There is borderline dilatation of the ascending aorta, measuring 37 mm. Venous: The inferior vena cava is dilated in size with less than 50% respiratory variability, suggesting right atrial pressure of 15 mmHg. IAS/Shunts: No atrial level shunt detected by color flow Doppler. Additional Comments: There is pleural effusion in the left lateral region.  LEFT  VENTRICLE PLAX 2D LVIDd:         3.80 cm     Diastology LVIDs:         2.30 cm     LV e' medial:    7.29 cm/s LV PW:         1.20 cm     LV E/e' medial:  16.3 LV IVS:        1.20 cm     LV e' lateral:   8.27 cm/s LVOT diam:     1.80 cm     LV E/e' lateral: 14.4 LV SV:         54 LV SV Index:   29 LVOT Area:     2.54 cm  LV Volumes (MOD) LV vol d, MOD A2C: 84.5 ml LV vol d, MOD A4C: 96.2 ml LV vol s, MOD A2C: 29.1 ml LV vol s, MOD A4C: 33.5 ml LV SV MOD A2C:     55.4 ml LV SV MOD A4C:     96.2 ml LV SV MOD BP:      61.8 ml RIGHT VENTRICLE             IVC RV Basal diam:  5.50 cm     IVC diam: 2.70 cm RV S prime:     10.70 cm/s TAPSE (M-mode): 2.1 cm LEFT ATRIUM              Index        RIGHT ATRIUM           Index LA diam:        4.10 cm  2.19 cm/m   RA Area:     34.20 cm LA Vol (A2C):   88.6 ml  47.37 ml/m  RA Volume:   119.00 ml 63.62 ml/m LA Vol (A4C):   101.0 ml 54.00 ml/m LA Biplane Vol: 96.5 ml  51.59 ml/m  AORTIC VALVE LVOT Vmax:   84.50 cm/s LVOT Vmean:  57.800 cm/s LVOT VTI:    0.213 m AI PHT:      426 msec  AORTA Ao Root diam: 3.40 cm Ao Asc diam:  3.70 cm MITRAL VALVE                TRICUSPID VALVE MV Area (PHT):  4.19 cm     TR Peak grad:   30.0 mmHg MV Decel Time: 181 msec     TR Vmax:        274.00 cm/s MR Peak grad: 80.3 mmHg MR Mean grad: 53.0 mmHg     SHUNTS MR Vmax:      448.00 cm/s   Systemic VTI:  0.21 m MR Vmean:     349.0 cm/s    Systemic Diam: 1.80 cm MV E velocity: 119.00 cm/s Glori Bickers MD Electronically signed by Glori Bickers MD Signature Date/Time: 01/01/2023/2:05:56 PM    Final    DG Chest Port 1 View  Result Date: 01/01/2023 CLINICAL DATA:  Weakness, losing strength and mobility slowly over several weeks now, sharper decline last week EXAM: PORTABLE CHEST 1 VIEW COMPARISON:  Portable exam 0904 hours compared to 07/16/2019 FINDINGS: Enlargement of cardiac silhouette with pulmonary vascular congestion. Atherosclerotic calcification aorta. BILATERAL interstitial infiltrates consistent with pulmonary edema and CHF. LEFT pleural effusion and basilar atelectasis. No pneumothorax. Bones demineralized with advanced BILATERAL glenohumeral degenerative changes and associated calcified loose bodies. IMPRESSION: CHF with small LEFT pleural effusion. Electronically Signed   By: Lavonia Dana M.D.   On: 01/01/2023 09:49  Assessment and Plan:   Atrial fibrillation - rate controlled, newly recognized Given severe biatrial enlargement, suspect we may not be able to maintain rhythm control - she is rate controlled without AV nodal blockade - given frailty and bradycardia, would hold BB - thankfully she seems unaware of her rhythm   Acute on Chronic diastolic heart failure Respiratory failure Pulmonary hypertension Valvular disease BNP 1210 CXR with edema and small left pleural effusion Question if this is related to her Afib Diuresing on gentle IV lasix  with bump in sCr 1.20 / CrCl 30 - 850 cc urine output yesterday - has received total of 60 mg IV lasix x 2 days in divided doses - follow renal function and urine output, may need bladder scan to confirm no urinary retention if urine OP  doesn't pick up with diuresis - will increase lasix today to 80 mg BID - would not be a good candidate for SGLT2i - if renal function stable, could resume ARB on discharge vs spironolactone    Elevated troponin 554 --> 506 Suspect demand mismatch in the setting of CHF Consider heparin gtt x 48 hr if no contraindication She would not be a good invasive/cath candidate Treat medically   Need for anticoagulation This patients CHA2DS2-VASc Score and unadjusted Ischemic Stroke Rate (% per year) is equal to 7.2 % stroke rate/year from a score of 5 (CHF, 4age, female, HTN) Running heparin gtt She is very sedentary, but did have a fall in jan 2024 with details unknown May not be a good long-term [place] candidate, will follow along and continue discussions with family - also note she does not have part D medicare - may explore patient assistance if she is placed on long term [place]    Hypertension PTA: bisoprolol-HCTZ 5-6.25 mg, 50 mg losartan Hold bisoprolol an dHCTZ - currently holding losartan - would add back on if abel to tolerate for GDMT vs spironolactone   Hyperlipidemia  Maintained on 5 mg crestor 10/02/2022: Cholesterol, Total 99; HDL 32; LDL Chol Calc (NIH) 51; Triglycerides 78   Leukocytosis WBC 13.1 UA unremarkable Per primary   Risk Assessment/Risk Scores:       New York Heart Association (NYHA) Functional Class NYHA Class III        For questions or updates, please contact [place] Please consult [email or web address] for contact info under    Signed, Ledora Bottcher, [place]  01/02/2023 10:52 AM  Patient seen and examined and agree with Fabian Sharp, PA as detailed above.  In brief, the patient is a 89 year old female with history of HTN, pre-diabetes, history of strep bacteremia without endocarditis who presented to [facility] with worsening weakness and generalized malaise found to be in new Afib with significant volume overload on exam for which Cardiology was  consulted.   Patient with no known significant cardiac history. Presented with overall decline in health status found to be markedly volume overloaded on exam with BNP 1210. ECG with rate controlled Afib with HR 56. Trop flat in 500s. TTE with LVEF [id number], mild RV systolic dysfunction, moderate pulmonary HTN, severe biatrial enlargement, mild to mod MR, mod TR, mild to moderate AI. She was started on lasix IV as well as heparin gtt for [facility].  Overall, suspect the patient's primary driver of symptoms is gross volume overload and diastolic dysfunction. While her Afib is newly detected, I suspect she has had Afib at home and may have not had symptoms given the degree of atrial dilation noted  on TTE. Her trop elevation is likely due to demand in the setting of HF and Afib with no plans for ischemic work-up at this time. Currently, the patient is answering "yes" to all posed questions, but per family, she is more alert at home and able to walk small distances with a walker. Had 2 falls in 1 year but does not fall frequently.   Will plan to increase diuresis now and continue heparin gtt for [facility] for Afib. Not on nodal agents and HR are well controlled. As volume status and renal function improve, will add GDMT as able. She is not a good SGLT2i candidate as she lives alone and is minimally mobile and higher risk for UTI. After discussion with family, they are leaning towards a trial of apixaban on discharge to see how she tolerates and may change their mind if the frequency of falls increase. I am concerned about her being able to care for herself at home alone.   GEN: Elderly female, tachypneic, answers "yes" to all questions   Neck: JVD to the earlobe Cardiac: Irregularly irregular, 2/6 systolic murmur Respiratory: Crackles at the bases bilaterally GI: Soft, nontender, non-distended  MS: 3+ pitting edema to the knees. Warm Neuro:  Only answering "yes" to questions. No gross deficits Psych: Normal affect     Plan: -Suspect trop elevation secondary to demand with no plans for ischemic work-up; will manage medically -Increase lasix to '80mg'$  IV BID and monitor response -Holding bisoprolol due to Afib with slow ventricular response -On heparin gtt for now; likely plan to transition to apixaban long-term if affordable -Continue crestor '5mg'$  daily -Consider spiro prior to discharge -Not a good SGLT2i as high risk for UTI  Gwyndolyn Kaufman, MD

## 2023-01-02 NOTE — TOC Initial Note (Signed)
Transition of Care [facility]) - Initial/Assessment Note    Patient Details  Name: Dawn Lawson MRN: [id number] Date of Birth: 1928/12/09  Transition of Care [facility]) CM/SW Contact:    Dessa Phi, RN Phone Number: 01/02/2023, 12:52 PM  Clinical Narrative: From home. Monitor for d/c needs.                  Expected Discharge Plan:  (TBD) Barriers to Discharge: Continued Medical Work up   Patient Goals and CMS Choice            Expected Discharge Plan and Services                                              Prior Living Arrangements/Services                       Activities of Daily Living Home Assistive Devices/Equipment: Cane (specify quad or straight), Walker (specify type) ADL Screening (condition at time of admission) Patient's cognitive ability adequate to safely complete daily activities?: Yes Is the patient deaf or have difficulty hearing?: No Does the patient have difficulty seeing, even when wearing glasses/contacts?: No Does the patient have difficulty concentrating, remembering, or making decisions?: Yes Patient able to express need for assistance with ADLs?: Yes Does the patient have difficulty dressing or bathing?: No Independently performs ADLs?: Yes (appropriate for developmental age) Does the patient have difficulty walking or climbing stairs?: Yes Weakness of Legs: Both Weakness of Arms/Hands: None  Permission Sought/Granted                  Emotional Assessment              Admission diagnosis:  Shortness of breath [R06.02] CHF (congestive heart failure) ([place]) [I50.9] Non-productive cough [R05.8] Elevated troponin [R79.89] NSTEMI (non-ST elevated myocardial infarction) ([place]) [I21.4] Atrial fibrillation with slow ventricular response ([place]) [I48.91] Generalized weakness [R53.1] Acute congestive heart failure, unspecified heart failure type ([place]) [I50.9] Patient Active Problem List   Diagnosis Date Noted   CHF  (congestive heart failure) ([place]) 01/01/2023   Weakness 12/12/2022   Prediabetes 08/19/2019   Mixed hyperlipidemia 08/19/2019   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   h/o Osteomyelitis ([place])- R leg-  07/16/2019   Class 3 severe obesity with serious comorbidity in adult ([place]) 07/16/2019   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, nodules and fibrosis 07/16/2019   Difficulty transferring from chair to toilet 07/16/2019   Family history of colon cancer in mother- died age 53; patient refuses colonoscopy 07/16/2019   Colonoscopy refused 07/16/2019   Vaccine refused by patient- flu and others 07/16/2019   Medically noncompliant 07/16/2019   Hypertensive crisis 07/16/2019   Hypertensive disorder 12/04/2017   Abscess of right thigh 02/22/2017   Macular degeneration 10/14/2014   Vitamin D deficiency 10/14/2014   HTN (hypertension) 10/14/2014   Anemia 10/14/2014   Arthritis 10/14/2014   PCP:  [name], NP Pharmacy:   [facility], [address] [place] [address] Phone: [phone] Fax: [phone]     Social Determinants of Health (SDOH) Social History: SDOH Screenings   Food Insecurity: No Food Insecurity (01/01/2023)  Housing: Low Risk  (01/01/2023)  Transportation Needs: No Transportation Needs (01/01/2023)  Utilities: Not At Risk (01/01/2023)  Alcohol Screen: Low Risk  (  10/12/2022)  Depression (PHQ2-9): Low Risk  (11/27/2022)  Financial Resource Strain: Low Risk  (10/12/2022)  Physical Activity: Sufficiently Active (10/12/2022)  Social Connections: Socially Isolated (10/12/2022)  Stress: No Stress Concern Present (10/12/2022)  Tobacco Use: Medium Risk (01/01/2023)   SDOH Interventions: Housing Interventions: Intervention Not Indicated   Readmission Risk Interventions     No data to display

## 2023-01-02 NOTE — Progress Notes (Addendum)
PROGRESS NOTE    Dawn Lawson  [id number] DOB: 1929-10-03 DOA: 01/01/2023 PCP: [name], NP    Brief Narrative:  Dawn Lawson is a 89 y.o. female with medical history significant of Pre-diabetes, arthritis, Hypertensive heart disease, Grade II diastolic dysfunction in setting of moderate asymmetric left ventricular hypertrophy of the basal-septal segment with EF of 65% noted on echo 2021, with in history of chronic b/l lower extremity swelling and interim history of several months of  progressive weakness with sharp decrease of mobility over last 1 week.  Patient lives alone with help from family as needed.   In the ER, found to have a fib and suspected CHF exacerbation   Assessment and Plan: Acute on chronic diastolic heart failure exacerbation -lasix 40 mg iv bid -- monitor Cr closelu -jardiance started in ER -echo completed results notes elevated right sided pressures ,  -ekg without hyperacute st -twave changes  -cardiology consulted   New Onset Afib - with slow ventricular response  -heparin drip for now  - await cardiology recs    NSTEMI , presumed type II  -elevated ce 500's -in setting oft chf/ and new onset afib  - echo without wall motion abnormality  -per cardiology   Pre-diabetes - A1c in 12/23: 6.1 -blood sugar low this AM as she was NPO   Hypertension -holding arb due to lower bp  -hold hctz as pt is currently on lasix  -defer further management to cards   Debility  - age related as well as acute illness - PT -patient uses walker at home  Obesity Estimated body mass index is 32.45 kg/m as calculated from the following:   Height as of this encounter: '5\' 2"'$  (1.575 m).   Weight as of this encounter: 80.5 kg.   lipedema with h/o tibia osteo -appears to be on keflex chronically    DVT prophylaxis: on heparin gtt    Code Status: DNR Family Communication: son at bedside  Disposition Plan:  Level of care: Progressive Status is:  Inpatient Remains inpatient appropriate because: need further diuresis    Consultants:  cards   Subjective: Eating breakfast, no current complaints  Objective: Vitals:   01/01/23 2306 01/02/23 0228 01/02/23 0318 01/02/23 0536  BP:   (!) 117/46   Pulse:   75   Resp:   (!) 22   Temp:   97.9 F (36.6 C)   TempSrc:   Oral   SpO2: 100% 98% 100%   Weight:    80.5 kg  Height:        Intake/Output Summary (Last 24 hours) at 01/02/2023 1119 Last data filed at 01/02/2023 1104 Gross per 24 hour  Intake 564.33 ml  Output 850 ml  Net -285.67 ml   Filed Weights   01/01/23 1511 01/02/23 0536  Weight: 80.5 kg 80.5 kg    Examination:   General: Appearance:    Obese female in no acute distress     Lungs:     Clear to auscultation bilaterally, respirations unlabored  Heart:    Normal heart rate.    MS:   All extremities are intact. + LE edema with chronic appearing skin changes in shins/feet   Neurologic:   Awake, alert, pleasant and cooperative       Data Reviewed: I have personally reviewed following labs and imaging studies  CBC: Recent Labs  Lab 01/01/23 0955 01/02/23 0058  WBC 10.6* 13.1*  HGB 10.9* 10.3*  HCT 33.8* 31.7*  MCV 89.9  88.5  PLT 247 [id number]   Basic Metabolic Panel: Recent Labs  Lab 01/01/23 0955 01/02/23 0058  NA 131* 128*  K 4.7 4.4  CL 98 95*  CO2 25 25  GLUCOSE 96 94  BUN 36* 46*  CREATININE 0.98 1.20*  CALCIUM 8.2* 8.3*   GFR: Estimated Creatinine Clearance: 30 mL/min (A) (by C-G formula based on SCr of 1.2 mg/dL (H)). Liver Function Tests: Recent Labs  Lab 01/01/23 0955 01/02/23 0058  AST 16 14*  ALT 11 13  ALKPHOS 48 47  BILITOT 0.7 0.7  PROT 5.6* 5.6*  ALBUMIN 3.1* 3.4*   No results for input(s): "LIPASE", "AMYLASE" in the last 168 hours. No results for input(s): "AMMONIA" in the last 168 hours. Coagulation Profile: Recent Labs  Lab 01/02/23 0058  INR 1.2   Cardiac Enzymes: No results for input(s): "CKTOTAL",  "CKMB", "CKMBINDEX", "TROPONINI" in the last 168 hours. BNP (last 3 results) No results for input(s): "PROBNP" in the last 8760 hours. HbA1C: No results for input(s): "HGBA1C" in the last 72 hours. CBG: Recent Labs  Lab 01/01/23 1705 01/01/23 2148 01/02/23 0737  GLUCAP 85 104* 62*   Lipid Profile: No results for input(s): "CHOL", "HDL", "LDLCALC", "TRIG", "CHOLHDL", "LDLDIRECT" in the last 72 hours. Thyroid Function Tests: Recent Labs    01/01/23 1645  TSH 1.448   Anemia Panel: No results for input(s): "VITAMINB12", "FOLATE", "FERRITIN", "TIBC", "IRON", "RETICCTPCT" in the last 72 hours. Sepsis Labs: Recent Labs  Lab 01/02/23 0754  LATICACIDVEN 1.5    Recent Results (from the past 240 hour(s))  Resp panel by RT-PCR (RSV, Flu A&B, Covid) Anterior Nasal Swab     Status: None   Collection Time: 01/01/23  9:23 AM   Specimen: Anterior Nasal Swab  Result Value Ref Range Status   SARS Coronavirus 2 by RT PCR NEGATIVE NEGATIVE Final    Comment: (NOTE) SARS-CoV-2 target nucleic acids are NOT DETECTED.  The SARS-CoV-2 RNA is generally detectable in upper respiratory specimens during the acute phase of infection. The lowest concentration of SARS-CoV-2 viral copies this assay can detect is 138 copies/mL. A negative result does not preclude SARS-Cov-2 infection and should not be used as the sole basis for treatment or other patient management decisions. A negative result may occur with  improper specimen collection/handling, submission of specimen other than nasopharyngeal swab, presence of viral mutation(s) within the areas targeted by this assay, and inadequate number of viral copies(<138 copies/mL). A negative result must be combined with clinical observations, patient history, and epidemiological information. The expected result is Negative.  Fact Sheet for Patients:  EntrepreneurPulse.com.au  Fact Sheet for Healthcare Providers:   IncredibleEmployment.be  This test is no t yet approved or cleared by the [place] FDA and  has been authorized for detection and/or diagnosis of SARS-CoV-2 by FDA under an Emergency Use Authorization (EUA). This EUA will remain  in effect (meaning this test can be used) for the duration of the COVID-19 declaration under Section 564(b)(1) of the Act, 21 U.S.C.section 360bbb-3(b)(1), unless the authorization is terminated  or revoked sooner.       Influenza A by PCR NEGATIVE NEGATIVE Final   Influenza B by PCR NEGATIVE NEGATIVE Final    Comment: (NOTE) The Xpert Xpress SARS-CoV-2/FLU/RSV plus assay is intended as an aid in the diagnosis of influenza from Nasopharyngeal swab specimens and should not be used as a sole basis for treatment. Nasal washings and aspirates are unacceptable for Xpert Xpress SARS-CoV-2/FLU/RSV testing.  Fact Sheet for  Patients: EntrepreneurPulse.com.au  Fact Sheet for Healthcare Providers: IncredibleEmployment.be  This test is not yet approved or cleared by the [place] FDA and has been authorized for detection and/or diagnosis of SARS-CoV-2 by FDA under an Emergency Use Authorization (EUA). This EUA will remain in effect (meaning this test can be used) for the duration of the COVID-19 declaration under Section 564(b)(1) of the Act, 21 U.S.C. section 360bbb-3(b)(1), unless the authorization is terminated or revoked.     Resp Syncytial Virus by PCR NEGATIVE NEGATIVE Final    Comment: (NOTE) Fact Sheet for Patients: EntrepreneurPulse.com.au  Fact Sheet for Healthcare Providers: IncredibleEmployment.be  This test is not yet approved or cleared by the [place] FDA and has been authorized for detection and/or diagnosis of SARS-CoV-2 by FDA under an Emergency Use Authorization (EUA). This EUA will remain in effect (meaning this test can be used) for  the duration of the COVID-19 declaration under Section 564(b)(1) of the Act, 21 U.S.C. section 360bbb-3(b)(1), unless the authorization is terminated or revoked.  Performed at [facility], [place] [address] [place], [place] [address]          Radiology Studies: ECHOCARDIOGRAM COMPLETE  Result Date: 01/01/2023    ECHOCARDIOGRAM REPORT   Patient Name:   Charlann Noss Date of Exam: 01/01/2023 Medical Rec #:  [id number]      Height:       62.0 in Accession #:    [id number]     Weight:       190.0 lb Date of Birth:  1929-06-17      BSA:          1.870 m Patient Age:    76 years       BP:           117/41 mmHg Patient Gender: F              HR:           48 bpm. Exam Location:  Inpatient Procedure: 2D Echo, Cardiac Doppler and Color Doppler Indications:    elevated troponin  History:        Patient has prior history of Echocardiogram examinations. Risk                 Factors:Hypertension.  Sonographer:    Phineas Douglas Referring Phys: [place]  1. Left ventricular ejection fraction, by estimation, is 60 to 65%. The left ventricle has normal function. The left ventricle has no regional wall motion abnormalities. There is mild concentric left ventricular hypertrophy. Left ventricular diastolic function could not be evaluated.  2. Right ventricular systolic function is mildly reduced. The right ventricular size is moderately enlarged. There is moderately elevated pulmonary artery systolic pressure. The estimated right ventricular systolic pressure is 0000000 mmHg.  3. Left atrial size was severely dilated.  4. Right atrial size was severely dilated.  5. The mitral valve is normal in structure. Mild to moderate mitral valve regurgitation. No evidence of mitral stenosis.  6. Tricuspid valve regurgitation is moderate.  7. The aortic valve is tricuspid. There is mild calcification of the aortic valve. Aortic valve regurgitation is mild to moderate. Aortic valve  sclerosis/calcification is present, without any evidence of aortic stenosis. Aortic regurgitation PHT measures 426 msec.  8. Aortic dilatation noted. There is borderline dilatation of the ascending aorta, measuring 37 mm.  9. The inferior vena cava is dilated in size with <50% respiratory variability, suggesting right atrial pressure of 15 mmHg. FINDINGS  Left Ventricle: Left ventricular ejection fraction, by estimation, is 60 to 65%. The left ventricle has normal function. The left ventricle has no regional wall motion abnormalities. The left ventricular internal cavity size was normal in size. There is  mild concentric left ventricular hypertrophy. Left ventricular diastolic function could not be evaluated due to atrial fibrillation. Left ventricular diastolic function could not be evaluated. Right Ventricle: The right ventricular size is moderately enlarged. No increase in right ventricular wall thickness. Right ventricular systolic function is mildly reduced. There is moderately elevated pulmonary artery systolic pressure. The tricuspid regurgitant velocity is 2.74 m/s, and with an assumed right atrial pressure of 15 mmHg, the estimated right ventricular systolic pressure is 0000000 mmHg. Left Atrium: Left atrial size was severely dilated. Right Atrium: Right atrial size was severely dilated. Pericardium: There is no evidence of pericardial effusion. Mitral Valve: The mitral valve is normal in structure. Mild to moderate mitral valve regurgitation. No evidence of mitral valve stenosis. Tricuspid Valve: The tricuspid valve is normal in structure. Tricuspid valve regurgitation is moderate . No evidence of tricuspid stenosis. Aortic Valve: The aortic valve is tricuspid. There is mild calcification of the aortic valve. Aortic valve regurgitation is mild to moderate. Aortic regurgitation PHT measures 426 msec. Aortic valve sclerosis/calcification is present, without any evidence of aortic stenosis. Pulmonic Valve: The  pulmonic valve was normal in structure. Pulmonic valve regurgitation is not visualized. No evidence of pulmonic stenosis. Aorta: Aortic dilatation noted. There is borderline dilatation of the ascending aorta, measuring 37 mm. Venous: The inferior vena cava is dilated in size with less than 50% respiratory variability, suggesting right atrial pressure of 15 mmHg. IAS/Shunts: No atrial level shunt detected by color flow Doppler. Additional Comments: There is pleural effusion in the left lateral region.  LEFT VENTRICLE PLAX 2D LVIDd:         3.80 cm     Diastology LVIDs:         2.30 cm     LV e' medial:    7.29 cm/s LV PW:         1.20 cm     LV E/e' medial:  16.3 LV IVS:        1.20 cm     LV e' lateral:   8.27 cm/s LVOT diam:     1.80 cm     LV E/e' lateral: 14.4 LV SV:         54 LV SV Index:   29 LVOT Area:     2.54 cm  LV Volumes (MOD) LV vol d, MOD A2C: 84.5 ml LV vol d, MOD A4C: 96.2 ml LV vol s, MOD A2C: 29.1 ml LV vol s, MOD A4C: 33.5 ml LV SV MOD A2C:     55.4 ml LV SV MOD A4C:     96.2 ml LV SV MOD BP:      61.8 ml RIGHT VENTRICLE             IVC RV Basal diam:  5.50 cm     IVC diam: 2.70 cm RV S prime:     10.70 cm/s TAPSE (M-mode): 2.1 cm LEFT ATRIUM              Index        RIGHT ATRIUM           Index LA diam:        4.10 cm  2.19 cm/m   RA Area:     34.20 cm LA Vol (  A2C):   88.6 ml  47.37 ml/m  RA Volume:   119.00 ml 63.62 ml/m LA Vol (A4C):   101.0 ml 54.00 ml/m LA Biplane Vol: 96.5 ml  51.59 ml/m  AORTIC VALVE LVOT Vmax:   84.50 cm/s LVOT Vmean:  57.800 cm/s LVOT VTI:    0.213 m AI PHT:      426 msec  AORTA Ao Root diam: 3.40 cm Ao Asc diam:  3.70 cm MITRAL VALVE                TRICUSPID VALVE MV Area (PHT): 4.19 cm     TR Peak grad:   30.0 mmHg MV Decel Time: 181 msec     TR Vmax:        274.00 cm/s MR Peak grad: 80.3 mmHg MR Mean grad: 53.0 mmHg     SHUNTS MR Vmax:      448.00 cm/s   Systemic VTI:  0.21 m MR Vmean:     349.0 cm/s    Systemic Diam: 1.80 cm MV E velocity: 119.00 cm/s Glori Bickers MD Electronically signed by Glori Bickers MD Signature Date/Time: 01/01/2023/2:05:56 PM    Final    DG Chest Port 1 View  Result Date: 01/01/2023 CLINICAL DATA:  Weakness, losing strength and mobility slowly over several weeks now, sharper decline last week EXAM: PORTABLE CHEST 1 VIEW COMPARISON:  Portable exam 0904 hours compared to 07/16/2019 FINDINGS: Enlargement of cardiac silhouette with pulmonary vascular congestion. Atherosclerotic calcification aorta. BILATERAL interstitial infiltrates consistent with pulmonary edema and CHF. LEFT pleural effusion and basilar atelectasis. No pneumothorax. Bones demineralized with advanced BILATERAL glenohumeral degenerative changes and associated calcified loose bodies. IMPRESSION: CHF with small LEFT pleural effusion. Electronically Signed   By: Lavonia Dana M.D.   On: 01/01/2023 09:49        Scheduled Meds:  aspirin EC  81 mg Oral Daily   furosemide  20 mg Intravenous Daily   insulin aspart  0-9 Units Subcutaneous TID WC   Continuous Infusions:  heparin 1,100 Units/hr (01/02/23 1104)     LOS: 1 day    Time spent: 45 minutes spent on chart review, discussion with nursing staff, consultants, updating family and interview/physical exam; more than 50% of that time was spent in counseling and/or coordination of care.    Geradine Girt, DO [facility] Available via Epic secure chat 7am-7pm After these hours, please refer to coverage provider listed on [email or web address] 01/02/2023, 11:19 AM

## 2023-01-03 ENCOUNTER — Other Ambulatory Visit (HOSPITAL_COMMUNITY): Payer: Self-pay

## 2023-01-03 DIAGNOSIS — I5031 Acute diastolic (congestive) heart failure: Secondary | ICD-10-CM | POA: Diagnosis not present

## 2023-01-03 LAB — CBC
HCT: 33.9 % — ABNORMAL LOW (ref 36.0–46.0)
Hemoglobin: 10.9 g/dL — ABNORMAL LOW (ref 12.0–15.0)
MCH: 28.8 pg (ref 26.0–34.0)
MCHC: 32.2 g/dL (ref 30.0–36.0)
MCV: 89.7 fL (ref 80.0–100.0)
Platelets: 231 10*3/uL (ref 150–400)
RBC: 3.78 MIL/uL — ABNORMAL LOW (ref 3.87–5.11)
RDW: 16.3 % — ABNORMAL HIGH (ref 11.5–15.5)
WBC: 13.1 10*3/uL — ABNORMAL HIGH (ref 4.0–10.5)
nRBC: 0 % (ref 0.0–0.2)

## 2023-01-03 LAB — LIPID PANEL
Cholesterol: 86 mg/dL (ref 0–200)
HDL: 32 mg/dL — ABNORMAL LOW (ref >40)
LDL Cholesterol: 44 mg/dL (ref 0–99)
Total CHOL/HDL Ratio: 2.7 RATIO
Triglycerides: 52 mg/dL (ref <150)
VLDL: 10 mg/dL (ref 0–40)

## 2023-01-03 LAB — HEPARIN LEVEL (UNFRACTIONATED): Heparin Unfractionated: 0.56 IU/mL (ref 0.30–0.70)

## 2023-01-03 LAB — BASIC METABOLIC PANEL
Anion gap: 10 (ref 5–15)
BUN: 46 mg/dL — ABNORMAL HIGH (ref 8–23)
CO2: 27 mmol/L (ref 22–32)
Calcium: 8.3 mg/dL — ABNORMAL LOW (ref 8.9–10.3)
Chloride: 94 mmol/L — ABNORMAL LOW (ref 98–111)
Creatinine, Ser: 1.19 mg/dL — ABNORMAL HIGH (ref 0.44–1.00)
GFR, Estimated: 43 mL/min — ABNORMAL LOW (ref >60)
Glucose, Bld: 91 mg/dL (ref 70–99)
Potassium: 4 mmol/L (ref 3.5–5.1)
Sodium: 131 mmol/L — ABNORMAL LOW (ref 135–145)

## 2023-01-03 LAB — GLUCOSE, CAPILLARY
Glucose-Capillary: 80 mg/dL (ref 70–99)
Glucose-Capillary: 139 mg/dL — ABNORMAL HIGH (ref 70–99)
Glucose-Capillary: 146 mg/dL — ABNORMAL HIGH (ref 70–99)
Glucose-Capillary: 219 mg/dL — ABNORMAL HIGH (ref 70–99)

## 2023-01-03 LAB — BRAIN NATRIURETIC PEPTIDE: B Natriuretic Peptide: 518.5 pg/mL — ABNORMAL HIGH (ref 0.0–100.0)

## 2023-01-03 MED ORDER — ORAL CARE MOUTH RINSE: 15.0000 mL | OROMUCOSAL | Status: DC | PRN

## 2023-01-03 MED ORDER — IPRATROPIUM-ALBUTEROL 0.5-2.5 (3) MG/3ML IN SOLN: 3.0000 mL | RESPIRATORY_TRACT | Status: DC | PRN

## 2023-01-03 MED ORDER — SENNOSIDES-DOCUSATE SODIUM 8.6-50 MG PO TABS: 1.0000 | ORAL_TABLET | Freq: Every evening | ORAL | Status: DC | PRN

## 2023-01-03 MED ORDER — INSULIN ASPART 100 UNIT/ML IJ SOLN
1.0000 [IU] | Freq: Once | INTRAMUSCULAR | Status: AC
  Administered 2023-01-03: 1 [IU] via SUBCUTANEOUS

## 2023-01-03 MED ORDER — METOPROLOL TARTRATE 5 MG/5ML IV SOLN: 5.0000 mg | INTRAVENOUS | Status: DC | PRN

## 2023-01-03 MED ORDER — APIXABAN 5 MG PO TABS
5.0000 mg | ORAL_TABLET | Freq: Two times a day (BID) | ORAL | Status: DC
  Administered 2023-01-03 – 2023-01-04 (×4): 5 mg via ORAL
  Filled 2023-01-03 (×5): qty 1

## 2023-01-03 MED ORDER — PREDNISONE 20 MG PO TABS
40.0000 mg | ORAL_TABLET | Freq: Every day | ORAL | Status: AC
  Administered 2023-01-03 – 2023-01-07 (×5): 40 mg via ORAL
  Filled 2023-01-03 (×5): qty 2

## 2023-01-03 MED ORDER — HYDRALAZINE HCL 20 MG/ML IJ SOLN: 10.0000 mg | INTRAMUSCULAR | Status: DC | PRN

## 2023-01-03 MED ORDER — TRAZODONE HCL 50 MG PO TABS
50.0000 mg | ORAL_TABLET | Freq: Every evening | ORAL | Status: DC | PRN
  Administered 2023-01-04 – 2023-01-07 (×4): 50 mg via ORAL
  Filled 2023-01-03 (×4): qty 1

## 2023-01-03 NOTE — Progress Notes (Signed)
PROGRESS NOTE    Dawn Lawson  [id number] DOB: [birth year] DOA: 01/01/2023 PCP: [name], NP   Brief Narrative:    89 y.o. female with medical history significant of Pre-diabetes, arthritis, Hypertensive heart disease, Grade II diastolic dysfunction in setting of moderate asymmetric left ventricular hypertrophy of the basal-septal segment with EF of 65% noted on echo 2021, with in history of chronic b/l lower extremity swelling and interim history of several months of  progressive weakness with sharp decrease of mobility over last 1 week.  Patient lives alone with help from family as needed. In the ER, found to have a fib and suspected CHF exacerbation  Assessment & Plan:  Principal Problem:   CHF (congestive heart failure) (HCC)    Acute on chronic diastolic heart failure exacerbation Acute respiratory distress secondary to CHF exacerbation -Echocardiogram shows ejection fraction 65% with moderately enlarged RV and elevated PASP.  Continue diuretics, monitor urine output.  Replete electrolytes as necessary.  Elevated troponin, NSTEMI - Could be from underlying demand ischemia but currently on heparin drip for total of 48 hours.  Continue aspirin Will check her lipid panel  Abnormal breath sounds - Likely from fluid overload but some rhonchi as well.  Does have previous history of smoking but no formal diagnosis of COPD.  Will add short course steroids and bronchodilators.   New Onset Afib -Currently on heparin drip.  Given her advanced age and comorbidities, will need to discuss if she is a good long-term candidate    Pre-diabetes -A1c 6.22 Sep 2022.  Closely monitor   Hypertension Soft blood pressure while getting aggressively diuresed.  Will slowly resume home meds when appropriate   Debility  PT/OT, uses walker at home   Obesity Estimated body mass index is 32.45 kg/m as calculated from the following:   Height as of this encounter: '5\' 2"'$  (1.575 m).   Weight  as of this encounter: 80.5 kg.    lipedema with h/o tibia osteo -Chronic knee on Keflex at home  PT/OT-SNF  DVT prophylaxis: Hep Drip > Eliquis Code Status: DNR Family Communication:  Called son  Status is: Inpatient Ongoing management for diastolic CHF and abnormal breath sounds.  Continue hospital stay for at least next 2-3 days Nutritional status     Subjective: Seen and examined at bedside.  Feels a little better this morning but still getting very dyspneic with minimal exertion.  Does admit of remote history of smoking.   Examination:  General exam: Appears calm and comfortable  Respiratory system: Diffuse bilateral abnormal breath sounds Cardiovascular system: S1 & S2 heard, RRR. No JVD, murmurs, rubs, gallops or clicks. No pedal edema. Gastrointestinal system: Abdomen is nondistended, soft and nontender. No organomegaly or masses felt. Normal bowel sounds heard. Central nervous system: Alert and oriented. No focal neurological deficits. Extremities: Symmetric 5 x 5 power. Skin: No rashes, lesions or ulcers Psychiatry: Judgement and insight appear normal. Mood & affect appropriate.     Objective: Vitals:   01/02/23 2036 01/03/23 0031 01/03/23 0500 01/03/23 0508  BP: (!) 107/43 (!) 120/43  (!) 109/45  Pulse: 84 (!) 59  71  Resp: (!) 28 (!) 26  (!) 22  Temp: 97.7 F (36.5 C) 98.5 F (36.9 C)  98.1 F (36.7 C)  TempSrc: Oral Oral  Oral  SpO2: 100% 99%  96%  Weight:   79.8 kg   Height:        Intake/Output Summary (Last 24 hours) at 01/03/2023 [id number] Last data filed  at 01/03/2023 0515 Gross per 24 hour  Intake 836.29 ml  Output 1550 ml  Net -713.71 ml   Filed Weights   01/01/23 1511 01/02/23 0536 01/03/23 0500  Weight: 80.5 kg 80.5 kg 79.8 kg     Data Reviewed:   CBC: Recent Labs  Lab 01/01/23 0955 01/02/23 0058 01/03/23 0504  WBC 10.6* 13.1* 13.1*  HGB 10.9* 10.3* 10.9*  HCT 33.8* 31.7* 33.9*  MCV 89.9 88.5 89.7  PLT 247 247 [id number]   Basic  Metabolic Panel: Recent Labs  Lab 01/01/23 0955 01/02/23 0058 01/03/23 0504  NA 131* 128* 131*  K 4.7 4.4 4.0  CL 98 95* 94*  CO2 '25 25 27  '$ GLUCOSE 96 94 91  BUN 36* 46* 46*  CREATININE 0.98 1.20* 1.19*  CALCIUM 8.2* 8.3* 8.3*   GFR: Estimated Creatinine Clearance: 30.1 mL/min (A) (by C-G formula based on SCr of 1.19 mg/dL (H)). Liver Function Tests: Recent Labs  Lab 01/01/23 0955 01/02/23 0058  AST 16 14*  ALT 11 13  ALKPHOS 48 47  BILITOT 0.7 0.7  PROT 5.6* 5.6*  ALBUMIN 3.1* 3.4*   No results for input(s): "LIPASE", "AMYLASE" in the last 168 hours. No results for input(s): "AMMONIA" in the last 168 hours. Coagulation Profile: Recent Labs  Lab 01/02/23 0058  INR 1.2   Cardiac Enzymes: No results for input(s): "CKTOTAL", "CKMB", "CKMBINDEX", "TROPONINI" in the last 168 hours. BNP (last 3 results) No results for input(s): "PROBNP" in the last 8760 hours. HbA1C: No results for input(s): "HGBA1C" in the last 72 hours. CBG: Recent Labs  Lab 01/02/23 0737 01/02/23 1201 01/02/23 1652 01/02/23 2113 01/03/23 0731  GLUCAP 62* 116* 112* 130* 80   Lipid Profile: No results for input(s): "CHOL", "HDL", "LDLCALC", "TRIG", "CHOLHDL", "LDLDIRECT" in the last 72 hours. Thyroid Function Tests: Recent Labs    01/01/23 1645  TSH 1.448   Anemia Panel: No results for input(s): "VITAMINB12", "FOLATE", "FERRITIN", "TIBC", "IRON", "RETICCTPCT" in the last 72 hours. Sepsis Labs: Recent Labs  Lab 01/02/23 0754 01/02/23 1107  LATICACIDVEN 1.5 1.9    Recent Results (from the past 240 hour(s))  Resp panel by RT-PCR (RSV, Flu A&B, Covid) Anterior Nasal Swab     Status: None   Collection Time: 01/01/23  9:23 AM   Specimen: Anterior Nasal Swab  Result Value Ref Range Status   SARS Coronavirus 2 by RT PCR NEGATIVE NEGATIVE Final    Comment: (NOTE) SARS-CoV-2 target nucleic acids are NOT DETECTED.  The SARS-CoV-2 RNA is generally detectable in upper  respiratory specimens during the acute phase of infection. The lowest concentration of SARS-CoV-2 viral copies this assay can detect is 138 copies/mL. A negative result does not preclude SARS-Cov-2 infection and should not be used as the sole basis for treatment or other patient management decisions. A negative result may occur with  improper specimen collection/handling, submission of specimen other than nasopharyngeal swab, presence of viral mutation(s) within the areas targeted by this assay, and inadequate number of viral copies(<138 copies/mL). A negative result must be combined with clinical observations, patient history, and epidemiological information. The expected result is Negative.  Fact Sheet for Patients:  EntrepreneurPulse.com.au  Fact Sheet for Healthcare Providers:  IncredibleEmployment.be  This test is no t yet approved or cleared by the [place] FDA and  has been authorized for detection and/or diagnosis of SARS-CoV-2 by FDA under an Emergency Use Authorization (EUA). This EUA will remain  in effect (meaning this test can be used) for  the duration of the COVID-19 declaration under Section 564(b)(1) of the Act, 21 U.S.C.section 360bbb-3(b)(1), unless the authorization is terminated  or revoked sooner.       Influenza A by PCR NEGATIVE NEGATIVE Final   Influenza B by PCR NEGATIVE NEGATIVE Final    Comment: (NOTE) The Xpert Xpress SARS-CoV-2/FLU/RSV plus assay is intended as an aid in the diagnosis of influenza from Nasopharyngeal swab specimens and should not be used as a sole basis for treatment. Nasal washings and aspirates are unacceptable for Xpert Xpress SARS-CoV-2/FLU/RSV testing.  Fact Sheet for Patients: EntrepreneurPulse.com.au  Fact Sheet for Healthcare Providers: IncredibleEmployment.be  This test is not yet approved or cleared by the [place] FDA and has been  authorized for detection and/or diagnosis of SARS-CoV-2 by FDA under an Emergency Use Authorization (EUA). This EUA will remain in effect (meaning this test can be used) for the duration of the COVID-19 declaration under Section 564(b)(1) of the Act, 21 U.S.C. section 360bbb-3(b)(1), unless the authorization is terminated or revoked.     Resp Syncytial Virus by PCR NEGATIVE NEGATIVE Final    Comment: (NOTE) Fact Sheet for Patients: EntrepreneurPulse.com.au  Fact Sheet for Healthcare Providers: IncredibleEmployment.be  This test is not yet approved or cleared by the [place] FDA and has been authorized for detection and/or diagnosis of SARS-CoV-2 by FDA under an Emergency Use Authorization (EUA). This EUA will remain in effect (meaning this test can be used) for the duration of the COVID-19 declaration under Section 564(b)(1) of the Act, 21 U.S.C. section 360bbb-3(b)(1), unless the authorization is terminated or revoked.  Performed at [facility], [place] [address], [place], [place] [address]          Radiology Studies: ECHOCARDIOGRAM COMPLETE  Result Date: 01/01/2023    ECHOCARDIOGRAM REPORT   Patient Name:   Charlann Noss Date of Exam: 01/01/2023 Medical Rec #:  [id number]      Height:       62.0 in Accession #:    [id number]     Weight:       190.0 lb Date of Birth:  1929-01-31      BSA:          1.870 m Patient Age:    53 years       BP:           117/41 mmHg Patient Gender: F              HR:           48 bpm. Exam Location:  Inpatient Procedure: 2D Echo, Cardiac Doppler and Color Doppler Indications:    elevated troponin  History:        Patient has prior history of Echocardiogram examinations. Risk                 Factors:Hypertension.  Sonographer:    Phineas Douglas Referring Phys: [place]  1. Left ventricular ejection fraction, by estimation, is 60 to 65%. The left ventricle has normal function.  The left ventricle has no regional wall motion abnormalities. There is mild concentric left ventricular hypertrophy. Left ventricular diastolic function could not be evaluated.  2. Right ventricular systolic function is mildly reduced. The right ventricular size is moderately enlarged. There is moderately elevated pulmonary artery systolic pressure. The estimated right ventricular systolic pressure is 0000000 mmHg.  3. Left atrial size was severely dilated.  4. Right atrial size was severely dilated.  5. The mitral valve is normal  in structure. Mild to moderate mitral valve regurgitation. No evidence of mitral stenosis.  6. Tricuspid valve regurgitation is moderate.  7. The aortic valve is tricuspid. There is mild calcification of the aortic valve. Aortic valve regurgitation is mild to moderate. Aortic valve sclerosis/calcification is present, without any evidence of aortic stenosis. Aortic regurgitation PHT measures 426 msec.  8. Aortic dilatation noted. There is borderline dilatation of the ascending aorta, measuring 37 mm.  9. The inferior vena cava is dilated in size with <50% respiratory variability, suggesting right atrial pressure of 15 mmHg. FINDINGS  Left Ventricle: Left ventricular ejection fraction, by estimation, is 60 to 65%. The left ventricle has normal function. The left ventricle has no regional wall motion abnormalities. The left ventricular internal cavity size was normal in size. There is  mild concentric left ventricular hypertrophy. Left ventricular diastolic function could not be evaluated due to atrial fibrillation. Left ventricular diastolic function could not be evaluated. Right Ventricle: The right ventricular size is moderately enlarged. No increase in right ventricular wall thickness. Right ventricular systolic function is mildly reduced. There is moderately elevated pulmonary artery systolic pressure. The tricuspid regurgitant velocity is 2.74 m/s, and with an assumed right atrial pressure  of 15 mmHg, the estimated right ventricular systolic pressure is 0000000 mmHg. Left Atrium: Left atrial size was severely dilated. Right Atrium: Right atrial size was severely dilated. Pericardium: There is no evidence of pericardial effusion. Mitral Valve: The mitral valve is normal in structure. Mild to moderate mitral valve regurgitation. No evidence of mitral valve stenosis. Tricuspid Valve: The tricuspid valve is normal in structure. Tricuspid valve regurgitation is moderate . No evidence of tricuspid stenosis. Aortic Valve: The aortic valve is tricuspid. There is mild calcification of the aortic valve. Aortic valve regurgitation is mild to moderate. Aortic regurgitation PHT measures 426 msec. Aortic valve sclerosis/calcification is present, without any evidence of aortic stenosis. Pulmonic Valve: The pulmonic valve was normal in structure. Pulmonic valve regurgitation is not visualized. No evidence of pulmonic stenosis. Aorta: Aortic dilatation noted. There is borderline dilatation of the ascending aorta, measuring 37 mm. Venous: The inferior vena cava is dilated in size with less than 50% respiratory variability, suggesting right atrial pressure of 15 mmHg. IAS/Shunts: No atrial level shunt detected by color flow Doppler. Additional Comments: There is pleural effusion in the left lateral region.  LEFT VENTRICLE PLAX 2D LVIDd:         3.80 cm     Diastology LVIDs:         2.30 cm     LV e' medial:    7.29 cm/s LV PW:         1.20 cm     LV E/e' medial:  16.3 LV IVS:        1.20 cm     LV e' lateral:   8.27 cm/s LVOT diam:     1.80 cm     LV E/e' lateral: 14.4 LV SV:         54 LV SV Index:   29 LVOT Area:     2.54 cm  LV Volumes (MOD) LV vol d, MOD A2C: 84.5 ml LV vol d, MOD A4C: 96.2 ml LV vol s, MOD A2C: 29.1 ml LV vol s, MOD A4C: 33.5 ml LV SV MOD A2C:     55.4 ml LV SV MOD A4C:     96.2 ml LV SV MOD BP:      61.8 ml RIGHT VENTRICLE  IVC RV Basal diam:  5.50 cm     IVC diam: 2.70 cm RV S prime:      10.70 cm/s TAPSE (M-mode): 2.1 cm LEFT ATRIUM              Index        RIGHT ATRIUM           Index LA diam:        4.10 cm  2.19 cm/m   RA Area:     34.20 cm LA Vol (A2C):   88.6 ml  47.37 ml/m  RA Volume:   119.00 ml 63.62 ml/m LA Vol (A4C):   101.0 ml 54.00 ml/m LA Biplane Vol: 96.5 ml  51.59 ml/m  AORTIC VALVE LVOT Vmax:   84.50 cm/s LVOT Vmean:  57.800 cm/s LVOT VTI:    0.213 m AI PHT:      426 msec  AORTA Ao Root diam: 3.40 cm Ao Asc diam:  3.70 cm MITRAL VALVE                TRICUSPID VALVE MV Area (PHT): 4.19 cm     TR Peak grad:   30.0 mmHg MV Decel Time: 181 msec     TR Vmax:        274.00 cm/s MR Peak grad: 80.3 mmHg MR Mean grad: 53.0 mmHg     SHUNTS MR Vmax:      448.00 cm/s   Systemic VTI:  0.21 m MR Vmean:     349.0 cm/s    Systemic Diam: 1.80 cm MV E velocity: 119.00 cm/s Glori Bickers MD Electronically signed by Glori Bickers MD Signature Date/Time: 01/01/2023/2:05:56 PM    Final    DG Chest Port 1 View  Result Date: 01/01/2023 CLINICAL DATA:  Weakness, losing strength and mobility slowly over several weeks now, sharper decline last week EXAM: PORTABLE CHEST 1 VIEW COMPARISON:  Portable exam [id number] hours compared to 07/16/2019 FINDINGS: Enlargement of cardiac silhouette with pulmonary vascular congestion. Atherosclerotic calcification aorta. BILATERAL interstitial infiltrates consistent with pulmonary edema and CHF. LEFT pleural effusion and basilar atelectasis. No pneumothorax. Bones demineralized with advanced BILATERAL glenohumeral degenerative changes and associated calcified loose bodies. IMPRESSION: CHF with small LEFT pleural effusion. Electronically Signed   By: Lavonia Dana M.D.   On: 01/01/2023 09:49        Scheduled Meds:  aspirin EC  81 mg Oral Daily   cephALEXin  500 mg Oral BID   furosemide  80 mg Intravenous BID   insulin aspart  0-9 Units Subcutaneous TID WC   Continuous Infusions:  heparin 1,100 Units/hr (01/02/23 1104)     LOS: 2 days   Time spent=  35 mins    Briseidy Spark Arsenio Loader, MD [facility]  If 7PM-7AM, please contact night-coverage  01/03/2023, 7:55 AM

## 2023-01-03 NOTE — NC FL2 (Signed)
[place] LEVEL OF CARE FORM     IDENTIFICATION  Patient Name: Dawn Lawson Birthdate: [birth year] Sex: female Admission Date (Current Location): 01/01/2023  [facility] and [place] Number:  Herbalist and Address:  [facility],  [place], [place]      Provider Number: [id number]  Attending Physician Name and Address:  Damita Lack, MD  Relative Name and Phone Number:   Kasandra Knudsen Tamer(Son)336 [phone])    Current Level of Care: Hospital Recommended Level of Care: [place] Prior Approval Number:    Date Approved/Denied:   PASRR Number:  ([id number])  Discharge Plan: SNF    Current Diagnoses: Patient Active Problem List   Diagnosis Date Noted   CHF (congestive heart failure) ([place]) 01/01/2023   Weakness 12/12/2022   Prediabetes 08/19/2019   Mixed hyperlipidemia 08/19/2019   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   h/o Osteomyelitis ([place])- R leg-  07/16/2019   Class 3 severe obesity with serious comorbidity in adult [facility]) 07/16/2019   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, nodules and fibrosis 07/16/2019   Difficulty transferring from chair to toilet 07/16/2019   Family history of colon cancer in mother- died age 24; patient refuses colonoscopy 07/16/2019   Colonoscopy refused 07/16/2019   Vaccine refused by patient- flu and others 07/16/2019   Medically noncompliant 07/16/2019   Hypertensive crisis 07/16/2019   Hypertensive disorder 12/04/2017   Abscess of right thigh 02/22/2017   Macular degeneration 10/14/2014   Vitamin D deficiency 10/14/2014   HTN (hypertension) 10/14/2014   Anemia 10/14/2014   Arthritis 10/14/2014    Orientation RESPIRATION BLADDER Height & Weight     Self, Time, Situation, Place  O2 Continent Weight: 79.8 kg Height:  '5\' 2"'$  (157.5 cm)  BEHAVIORAL SYMPTOMS/MOOD NEUROLOGICAL BOWEL NUTRITION STATUS      Continent Diet (Regular)  AMBULATORY  STATUS COMMUNICATION OF NEEDS Skin   Extensive Assist Verbally Normal                       Personal Care Assistance Level of Assistance  Bathing, Feeding, Dressing Bathing Assistance: Limited assistance Feeding assistance: Limited assistance Dressing Assistance: Maximum assistance     Functional Limitations Info  Sight, Hearing, Speech Sight Info: Impaired (readers) Hearing Info: Adequate Speech Info: Adequate    SPECIAL CARE FACTORS FREQUENCY  PT (By licensed PT), OT (By licensed OT)     PT Frequency:  (5x week) OT Frequency:  (5x week)            Contractures Contractures Info: Not present    Additional Factors Info  Code Status, Allergies Code Status Info:  (DNR) Allergies Info:  (Penicillins)           Current Medications (01/03/2023):  This is the current hospital active medication list Current Facility-Administered Medications  Medication Dose Route Frequency Provider Last Rate Last Admin   acetaminophen (TYLENOL) tablet 650 mg  650 mg Oral Q6H PRN Clance Boll, MD   650 mg at 01/03/23 1243   Or   acetaminophen (TYLENOL) suppository 650 mg  650 mg Rectal Q6H PRN Clance Boll, MD       apixaban Arne Cleveland) tablet 5 mg  5 mg Oral BID Geralynn Rile, MD   5 mg at 01/03/23 1243   aspirin EC tablet 81 mg  81 mg Oral Daily Myles Rosenthal A, MD   81 mg at 01/03/23 1010   cephALEXin (KEFLEX) capsule 500  mg  500 mg Oral BID Eulogio Bear U, DO   500 mg at 01/03/23 1010   furosemide (LASIX) injection 80 mg  80 mg Intravenous BID Ledora Bottcher, PA   80 mg at 01/03/23 1003   hydrALAZINE (APRESOLINE) injection 10 mg  10 mg Intravenous Q4H PRN Amin, Ankit Chirag, MD       insulin aspart (novoLOG) injection 0-9 Units  0-9 Units Subcutaneous TID WC Clance Boll, MD   1 Units at 01/03/23 1244   ipratropium-albuterol (DUONEB) 0.5-2.5 (3) MG/3ML nebulizer solution 3 mL  3 mL Nebulization Q4H PRN Amin, Ankit Chirag, MD       metoprolol  tartrate (LOPRESSOR) injection 5 mg  5 mg Intravenous Q4H PRN Amin, Ankit Chirag, MD       ondansetron (ZOFRAN) tablet 4 mg  4 mg Oral Q6H PRN Clance Boll, MD       Or   ondansetron (ZOFRAN) injection 4 mg  4 mg Intravenous Q6H PRN Clance Boll, MD       Oral care mouth rinse  15 mL Mouth Rinse PRN Vann, Jessica U, DO       predniSONE (DELTASONE) tablet 40 mg  40 mg Oral Q breakfast Amin, Ankit Chirag, MD   40 mg at 01/03/23 1243   senna-docusate (Senokot-S) tablet 1 tablet  1 tablet Oral QHS PRN Amin, Ankit Chirag, MD       traZODone (DESYREL) tablet 50 mg  50 mg Oral QHS PRN Amin, Jeanella Flattery, MD         Discharge Medications: Please see discharge summary for a list of discharge medications.  Relevant Imaging Results:  Relevant Lab Results:   Additional Information SSN: [id number]  Dessa Phi, RN

## 2023-01-03 NOTE — Evaluation (Addendum)
Physical Therapy Evaluation Patient Details Name: Dawn Lawson MRN: [id number] DOB: 26-Mar-1929 Today's Date: 01/03/2023  History of Present Illness  Dawn Lawson is a 89 y.o. female with medical history significant of Pre-diabetes, arthritis, Hypertensive heart disease, Grade II diastolic dysfunction in setting of moderate asymmetric left ventricular hypertrophy of the basal-septal segment with EF of 65% noted on echo 2021,  chronic b/l lower extremity swelling. with sharp decrease of mobility over last 1 week.  Patient lives alone with help from family as needed.    In the ER 01/01/23 , found to have a fib and suspected CHF exacerbation  Clinical Impression  Pt admitted with above diagnosis.  Pt currently with functional limitations due to the deficits listed below (see PT Problem List). Pt will benefit from skilled PT to increase their independence and safety with mobility to allow discharge to the venue listed below.    The patient required total assistance for rolling and to move to sitting  but unable to fully sit upright, Right knee fused and patient at risk to slide forward. Patient has significant edema of legs and trunk.sign   Per son, patient  ambulatory in the her home, limited to BR, uses a lift  chair and standard walker. Patient is alone except when son checks on patient 4 times a day. Info  about meals not clear, patient reports that she  cooks., has a gas stove.  Continue mobility/ OOB efforts as able.     Recommendations for follow up therapy are one component of a multi-disciplinary discharge planning process, led by the attending physician.  Recommendations may be updated based on patient status, additional functional criteria and insurance authorization.  Follow Up Recommendations Skilled nursing-short term rehab (<3 hours/day)  patient will require 24/7 assistance, patient will not have 24/7 per son Can patient physically be transported by private vehicle: No    Assistance  Recommended at Discharge Frequent or constant Supervision/Assistance  Patient can return home with the following  Two people to help with walking and/or transfers;Two people to help with bathing/dressing/bathroom;Help with stairs or ramp for entrance;Assistance with cooking/housework    Equipment Recommendations  (TBD)  Recommendations for Other Services       Functional Status Assessment Patient has had a recent decline in their functional status and demonstrates the ability to make significant improvements in function in a reasonable and predictable amount of time.     Precautions / Restrictions Precautions Precautions: Fall Precaution Comments: right knee fused Restrictions Weight Bearing Restrictions: No      Mobility  Bed Mobility Overal bed mobility: Needs Assistance Bed Mobility: Rolling, Supine to Sit, Sit to Supine Rolling: +2 for safety/equipment, +2 for physical assistance, Total assist   Supine to sit: +2 for physical assistance, Total assist, +2 for safety/equipment, HOB elevated     General bed mobility comments: patient only able to reach for rail, unable to move the legs to assist with rolling and sitting up. Noted right knee fused.    Transfers                        Ambulation/Gait                  Stairs            Wheelchair Mobility    Modified Rankin (Stroke Patients Only)       Balance Overall balance assessment: History of Falls, Needs assistance Sitting-balance support: Feet unsupported Sitting balance-Leahy  Scale: Zero Sitting balance - Comments: patient leanining back and tends to slide forward on bed edge Postural control: Posterior lean                                   Pertinent Vitals/Pain Pain Assessment Pain Assessment: Faces Faces Pain Scale: Hurts even more Pain Location: both legs totouch and move the legs Pain Descriptors / Indicators: Discomfort, Moaning, Guarding, Grimacing Pain  Intervention(s): Limited activity within patient's tolerance, Monitored during session    Home Living Family/patient expects to be discharged to:: Private residence Living Arrangements: Alone Available Help at Discharge: Family;Available PRN/intermittently Type of Home: House Home Access: Stairs to enter   [employer] of Steps: 2   Home Layout: One level Home Equipment: BSC/3in1;Standard Environmental consultant Additional Comments: son comes to see pt 4 x's a day.    Prior Function Prior Level of Function : Independent/Modified Independent             Mobility Comments: has a lift chair, was able to ambulate 3/10 per son, uses SW ADLs Comments: unsure     Hand Dominance   Dominant Hand: Right    Extremity/Trunk Assessment   Upper Extremity Assessment Upper Extremity Assessment:  (limited shoulder elevation)    Lower Extremity Assessment Lower Extremity Assessment: RLE deficits/detail;LLE deficits/detail RLE Deficits / Details: knee fused in extension, noted edema and redness lower leg. LLE Deficits / Details: edema and redness of lower leg.    Cervical / Trunk Assessment Cervical / Trunk Assessment: Other exceptions Cervical / Trunk Exceptions: difficult to assess due to inability to sit up  Communication   Communication: No difficulties  Cognition Arousal/Alertness: Awake/alert Behavior During Therapy: WFL for tasks assessed/performed Overall Cognitive Status: No family/caregiver present to determine baseline cognitive functioning                                 General Comments: oriented to month and  CONE, does not give clear indication of PLOF.        General Comments      Exercises     Assessment/Plan    PT Assessment Patient needs continued PT services  PT Problem List Decreased strength;Decreased activity tolerance;Decreased mobility;Decreased cognition;Decreased knowledge of use of DME;Decreased safety awareness;Decreased knowledge of  precautions;Decreased skin integrity;Obesity;Pain       PT Treatment Interventions DME instruction;Therapeutic exercise;Gait training;Balance training;Functional mobility training;Therapeutic activities;Patient/family education    PT Goals (Current goals can be found in the Care Plan section)  Acute Rehab PT Goals Patient Stated Goal: to go home PT Goal Formulation: With patient/family Time For Goal Achievement: 01/17/23 Potential to Achieve Goals: Fair    Frequency Min 2X/week     Co-evaluation               AM-PAC PT "6 Clicks" Mobility  Outcome Measure Help needed turning from your back to your side while in a flat bed without using bedrails?: Total Help needed moving from lying on your back to sitting on the side of a flat bed without using bedrails?: Total Help needed moving to and from a bed to a chair (including a wheelchair)?: Total Help needed standing up from a chair using your arms (e.g., wheelchair or bedside chair)?: Total Help needed to walk in hospital room?: Total Help needed climbing 3-5 steps with a railing? : Total 6 Click Score: 6  End of Session   Activity Tolerance: Patient limited by pain;Patient limited by fatigue Patient left: in bed;with call bell/phone within reach;with bed alarm set Nurse Communication: Mobility status;Need for lift equipment PT Visit Diagnosis: Unsteadiness on feet (R26.81);Muscle weakness (generalized) (M62.81);Difficulty in walking, not elsewhere classified (R26.2)    Time: [id number] PT Time Calculation (min) (ACUTE ONLY): 31 min   Charges:   PT Evaluation $PT Eval Low Complexity: 1 Low PT Treatments $Therapeutic Activity: 8-22 mins        Tresa Endo PT Acute Rehabilitation Services Office [phone] Weekend [id number]   Claretha Cooper 01/03/2023, 11:32 AM

## 2023-01-03 NOTE — Evaluation (Signed)
Occupational Therapy Evaluation Patient Details Name: Dawn Lawson MRN: [id number] DOB: 1929/08/20 Today's Date: 01/03/2023   History of Present Illness Patient is a 89 year old female who presented with 1 week history of progressive weakness and decreased mobility. Patient was admitted with A fib, and CHF exacerbation. PMH: Pre-diabetes, arthritis, Hypertensive heart disease, Grade II diastolic dysfunction in setting of moderate asymmetric left ventricular hypertrophy of the basal-septal segment with EF of 65% noted on echo 2021,   Clinical Impression   Patient is a 89 year old female who was admitted for above. Patient was living at home with son support during week days and daughter support on weekend with physical A to stand some days. Currently, patient is unable to tolerate touch to BLE or movement with patient calling out for pain in RLE. Nurse aware. Patient was noted to have decreased functional activity tolernace, increased pain, decreased ROM, decreased BUE strength, decreased endurance, decreased sitting balance, decreased standing balanced, decreased safety awareness, and decreased knowledge of AE/AD impacting participation in ADLs. Patient would continue to benefit from skilled OT services at this time while admitted and after d/c to address noted deficits in order to improve overall safety and independence in ADLs.        Recommendations for follow up therapy are one component of a multi-disciplinary discharge planning process, led by the attending physician.  Recommendations may be updated based on patient status, additional functional criteria and insurance authorization.   Follow Up Recommendations  Skilled nursing-short term rehab (<3 hours/day)     Assistance Recommended at Discharge Frequent or constant Supervision/Assistance  Patient can return home with the following Two people to help with walking and/or transfers;Assistance with cooking/housework;Direct  supervision/assist for medications management;Assist for transportation;Direct supervision/assist for financial management;Help with stairs or ramp for entrance;Two people to help with bathing/dressing/bathroom    Functional Status Assessment  Patient has had a recent decline in their functional status and demonstrates the ability to make significant improvements in function in a reasonable and predictable amount of time.  Equipment Recommendations          Precautions / Restrictions Precautions Precautions: Fall Precaution Comments: right knee fused Restrictions Weight Bearing Restrictions: No      Mobility Bed Mobility Overal bed mobility: Needs Assistance             General bed mobility comments: patient was +2 for repositioning in bed. patient declined to get to EOB.      Balance       Sitting balance - Comments: patient declined to attempt sitting EOB with increased pain in BLE         ADL either performed or assessed with clinical judgement   ADL Overall ADL's : Needs assistance/impaired Eating/Feeding: Supervision/ safety;Sitting Eating/Feeding Details (indicate cue type and reason): in chair in bed with report of pain in R hip. patient was educated on importance of doing simple grooming and UB ADL tasks for herself to prevent learned helplessness. patient and daughters verbalized understanding. Grooming: Brushing hair;Min guard Grooming Details (indicate cue type and reason): cues to ensure getting all areas. daughters are very helpful almost too much for patient who is eager to pass off tasks she could complete herself. Upper Body Bathing: Minimal assistance;Bed level   Lower Body Bathing: Bed level;Total assistance   Upper Body Dressing : Minimal assistance;Bed level   Lower Body Dressing: Bed level;Total assistance     Toilet Transfer Details (indicate cue type and reason): patient was unable to tolerate  movement of BLE with +2 for repositioning in  bed. Toileting- Clothing Manipulation and Hygiene: Bed level;Total assistance               Vision   Additional Comments: patient reported having a hard time seeing the incentive spirometer but did not expand. patients daughters indicated she has had difficulty for years. glasses present on table but declined to don them            Pertinent Vitals/Pain Pain Assessment Pain Assessment: Faces Faces Pain Scale: Hurts whole lot Pain Location: both legs totouch and move the legs Pain Descriptors / Indicators: Discomfort, Moaning, Guarding, Grimacing Pain Intervention(s): Limited activity within patient's tolerance, Monitored during session, Repositioned     Hand Dominance Right   Extremity/Trunk Assessment Upper Extremity Assessment Upper Extremity Assessment: LUE deficits/detail;RUE deficits/detail RUE Deficits / Details: patient was able to FF over 110 degrees. patients grip strength was 4/5 LUE Deficits / Details: noted to reach about 90 degreed FF with trunk leaning to attempt to get higher sitting in chair position in bed. grip strength 4/5   Lower Extremity Assessment Lower Extremity Assessment: Defer to PT evaluation (R knee fused)       Communication Communication Communication: No difficulties   Cognition Arousal/Alertness: Awake/alert   Overall Cognitive Status: History of cognitive impairments - at baseline       General Comments: patients two daughters were present in room and son was called and able to provide PLOF. patient was noted to chime into conversation with off topic statements. "my neighbors reported they were not going to have a christmas, so i said yes you are" then telling story of neighbor not being able to afford things and her aiding them while trying to obtain PLOF. daughter did not appear suprised with off topic statements.     General Comments  patients daughter in room called patients son as he is the main caretaker for patient. son comes  to see pt 4 x's a day. son does endorse that patient is unable to leave house in emergency at baseline as she needs physical A to get up. patient's son was made aware of concerns over patients safety in environment incase of emergency from therapy standpoint. patients son and daughters verbalzied understanding and reported that patient did have life alert at home.            Home Living Family/patient expects to be discharged to:: Private residence Living Arrangements: Alone Available Help at Discharge: Family;Available PRN/intermittently Type of Home: House Home Access: Stairs to enter [employer] of Steps: 2   Home Layout: One level     Bathroom Shower/Tub: Teacher, early years/pre: Standard     Home Equipment: BSC/3in1;Standard Environmental consultant   Additional Comments: son comes to see pt 4 x's a day.      Prior Functioning/Environment Prior Level of Function : Independent/Modified Independent             Mobility Comments: has a lift chair, was able to ambulate 3/10 per son, uses SW ADLs Comments: patient had help from family for meals and IADL tasks. patient has lift chair with family helping with sit to stands.        OT Problem List: Decreased strength;Decreased activity tolerance;Impaired balance (sitting and/or standing);Decreased coordination;Decreased safety awareness;Decreased knowledge of precautions;Pain;Impaired UE functional use;Decreased knowledge of use of DME or AE;Cardiopulmonary status limiting activity      OT Treatment/Interventions: Self-care/ADL training;Energy conservation;Therapeutic exercise;DME and/or AE instruction;Therapeutic activities;Patient/family education;Balance training  OT Goals(Current goals can be found in the care plan section) Acute Rehab OT Goals Patient Stated Goal: to get back home OT Goal Formulation: With patient/family Time For Goal Achievement: 01/17/23 Potential to Achieve Goals: Fair  OT Frequency: Min  2X/week       AM-PAC OT "6 Clicks" Daily Activity     Outcome Measure Help from another person eating meals?: A Little Help from another person taking care of personal grooming?: A Little Help from another person toileting, which includes using toliet, bedpan, or urinal?: Total Help from another person bathing (including washing, rinsing, drying)?: Total Help from another person to put on and taking off regular upper body clothing?: A Lot Help from another person to put on and taking off regular lower body clothing?: Total 6 Click Score: 11   End of Session Nurse Communication: Other (comment) (ok to participate in session)  Activity Tolerance: Patient limited by pain Patient left: in bed;with call bell/phone within reach;with family/visitor present  OT Visit Diagnosis: Unsteadiness on feet (R26.81);Other abnormalities of gait and mobility (R26.89);Pain;Muscle weakness (generalized) (M62.81)                Time: [id number] OT Time Calculation (min): 25 min Charges:  OT General Charges $OT Visit: 1 Visit OT Evaluation $OT Eval Low Complexity: 1 Low OT Treatments $Self Care/Home Management : 8-22 mins  Rennie Plowman, MS Acute Rehabilitation Department Office# [phone]   Willa Rough 01/03/2023, 4:58 PM

## 2023-01-03 NOTE — TOC Progression Note (Signed)
Transition of Care [facility]) - Progression Note    Patient Details  Name: Dawn Lawson MRN: [id number] Date of Birth: 13-Aug-1929  [facility]) CM/SW Contact  Makalah Asberry, Juliann Pulse, RN Phone Number: 01/03/2023, 1:13 PM  Clinical Narrative: faxed out per permission await bed offers.      Expected Discharge Plan: [place] Barriers to Discharge: Continued Medical Work up  Expected Discharge Plan and Services                                               Social Determinants of Health (SDOH) Interventions SDOH Screenings   Food Insecurity: No Food Insecurity (01/01/2023)  Housing: Low Risk  (01/01/2023)  Transportation Needs: No Transportation Needs (01/01/2023)  Utilities: Not At Risk (01/01/2023)  Alcohol Screen: Low Risk  (10/12/2022)  Depression (PHQ2-9): Low Risk  (11/27/2022)  Financial Resource Strain: Low Risk  (10/12/2022)  Physical Activity: Sufficiently Active (10/12/2022)  Social Connections: Socially Isolated (10/12/2022)  Stress: No Stress Concern Present (10/12/2022)  Tobacco Use: Medium Risk (01/01/2023)    Readmission Risk Interventions     No data to display

## 2023-01-03 NOTE — Progress Notes (Signed)
Cardiology Progress Note  Patient ID: Dawn Lawson MRN: [id number] DOB: 1929/03/07 Date of Encounter: 01/03/2023  Primary Cardiologist: Freada Bergeron, MD  Subjective   Chief Complaint: SOB  HPI: Remains grossly volume overloaded.  ROS:  All other ROS reviewed and negative. Pertinent positives noted in the HPI.     Inpatient Medications  Scheduled Meds:  apixaban  5 mg Oral BID   aspirin EC  81 mg Oral Daily   cephALEXin  500 mg Oral BID   furosemide  80 mg Intravenous BID   insulin aspart  0-9 Units Subcutaneous TID WC   Continuous Infusions:  PRN Meds: acetaminophen **OR** acetaminophen, hydrALAZINE, ipratropium-albuterol, metoprolol tartrate, ondansetron **OR** ondansetron (ZOFRAN) IV, mouth rinse, senna-docusate, traZODone   Vital Signs   Vitals:   01/02/23 2036 01/03/23 0031 01/03/23 0500 01/03/23 0508  BP: (!) 107/43 (!) 120/43  (!) 109/45  Pulse: 84 (!) 59  71  Resp: (!) 28 (!) 26  (!) 22  Temp: 97.7 F (36.5 C) 98.5 F (36.9 C)  98.1 F (36.7 C)  TempSrc: Oral Oral  Oral  SpO2: 100% 99%  96%  Weight:   79.8 kg   Height:        Intake/Output Summary (Last 24 hours) at 01/03/2023 1000 Last data filed at 01/03/2023 0515 Gross per 24 hour  Intake 386.29 ml  Output 1550 ml  Net -1163.71 ml      01/03/2023    5:00 AM 01/02/2023    5:36 AM 01/01/2023    3:11 PM  Last 3 Weights  Weight (lbs) 176 lb 177 lb 6.4 oz 177 lb 6.4 oz  Weight (kg) 79.833 kg 80.468 kg 80.468 kg      Telemetry  Overnight telemetry shows Afb 40-90 bpm, which I personally reviewed.    Physical Exam   Vitals:   01/02/23 2036 01/03/23 0031 01/03/23 0500 01/03/23 0508  BP: (!) 107/43 (!) 120/43  (!) 109/45  Pulse: 84 (!) 59  71  Resp: (!) 28 (!) 26  (!) 22  Temp: 97.7 F (36.5 C) 98.5 F (36.9 C)  98.1 F (36.7 C)  TempSrc: Oral Oral  Oral  SpO2: 100% 99%  96%  Weight:   79.8 kg   Height:        Intake/Output Summary (Last 24 hours) at 01/03/2023 1000 Last data  filed at 01/03/2023 0515 Gross per 24 hour  Intake 386.29 ml  Output 1550 ml  Net -1163.71 ml       01/03/2023    5:00 AM 01/02/2023    5:36 AM 01/01/2023    3:11 PM  Last 3 Weights  Weight (lbs) 176 lb 177 lb 6.4 oz 177 lb 6.4 oz  Weight (kg) 79.833 kg 80.468 kg 80.468 kg    Body mass index is 32.19 kg/m.  General: Well nourished, well developed, in no acute distress Head: Atraumatic, normal size  Eyes: PEERLA, EOMI  Neck: Supple, JVD 15 to 17 cm of water Endocrine: No thryomegaly Cardiac: Normal S1, S2; irregular rhythm, no murmurs Lungs: Crackles at the lung bases Abd: Soft, nontender, no hepatomegaly  Ext: 2+ pitting edema Musculoskeletal: No deformities, BUE and BLE strength normal and equal Skin: Warm and dry, no rashes   Neuro: Alert and oriented to person, place, time, and situation, CNII-XII grossly intact, no focal deficits  Psych: Normal mood and affect   Labs  High Sensitivity Troponin:   Recent Labs  Lab 01/01/23 0955 01/01/23 1231  TROPONINIHS 554* 506*  Cardiac EnzymesNo results for input(s): "TROPONINI" in the last 168 hours. No results for input(s): "TROPIPOC" in the last 168 hours.  Chemistry Recent Labs  Lab 01/01/23 0955 01/02/23 0058 01/03/23 0504  NA 131* 128* 131*  K 4.7 4.4 4.0  CL 98 95* 94*  CO2 '25 25 27  '$ GLUCOSE 96 94 91  BUN 36* 46* 46*  CREATININE 0.98 1.20* 1.19*  CALCIUM 8.2* 8.3* 8.3*  PROT 5.6* 5.6*  --   ALBUMIN 3.1* 3.4*  --   AST 16 14*  --   ALT 11 13  --   ALKPHOS 48 47  --   BILITOT 0.7 0.7  --   GFRNONAA 54* 43* 43*  ANIONGAP '8 8 10    '$ Hematology Recent Labs  Lab 01/01/23 0955 01/02/23 0058 01/03/23 0504  WBC 10.6* 13.1* 13.1*  RBC 3.76* 3.58* 3.78*  HGB 10.9* 10.3* 10.9*  HCT 33.8* 31.7* 33.9*  MCV 89.9 88.5 89.7  MCH 29.0 28.8 28.8  MCHC 32.2 32.5 32.2  RDW 16.1* 15.9* 16.3*  PLT 247 247 231   BNP Recent Labs  Lab 01/01/23 0955  BNP 1,209.2*    DDimer No results for input(s): "DDIMER" in the  last 168 hours.   Radiology  ECHOCARDIOGRAM COMPLETE  Result Date: 01/01/2023    ECHOCARDIOGRAM REPORT   Patient Name:   Dawn Lawson Date of Exam: 01/01/2023 Medical Rec #:  [id number]      Height:       62.0 in Accession #:    [id number]     Weight:       190.0 lb Date of Birth:  [birth year]      BSA:          1.870 m Patient Age:    67 years       BP:           117/41 mmHg Patient Gender: F              HR:           48 bpm. Exam Location:  Inpatient Procedure: 2D Echo, Cardiac Doppler and Color Doppler Indications:    elevated troponin  History:        Patient has prior history of Echocardiogram examinations. Risk                 Factors:Hypertension.  Sonographer:    Phineas Douglas Referring Phys: [place]  1. Left ventricular ejection fraction, by estimation, is 60 to 65%. The left ventricle has normal function. The left ventricle has no regional wall motion abnormalities. There is mild concentric left ventricular hypertrophy. Left ventricular diastolic function could not be evaluated.  2. Right ventricular systolic function is mildly reduced. The right ventricular size is moderately enlarged. There is moderately elevated pulmonary artery systolic pressure. The estimated right ventricular systolic pressure is 0000000 mmHg.  3. Left atrial size was severely dilated.  4. Right atrial size was severely dilated.  5. The mitral valve is normal in structure. Mild to moderate mitral valve regurgitation. No evidence of mitral stenosis.  6. Tricuspid valve regurgitation is moderate.  7. The aortic valve is tricuspid. There is mild calcification of the aortic valve. Aortic valve regurgitation is mild to moderate. Aortic valve sclerosis/calcification is present, without any evidence of aortic stenosis. Aortic regurgitation PHT measures 426 msec.  8. Aortic dilatation noted. There is borderline dilatation of the ascending aorta, measuring 37 mm.  9. The inferior vena cava is dilated  in size with <50%  respiratory variability, suggesting right atrial pressure of 15 mmHg. FINDINGS  Left Ventricle: Left ventricular ejection fraction, by estimation, is 60 to 65%. The left ventricle has normal function. The left ventricle has no regional wall motion abnormalities. The left ventricular internal cavity size was normal in size. There is  mild concentric left ventricular hypertrophy. Left ventricular diastolic function could not be evaluated due to atrial fibrillation. Left ventricular diastolic function could not be evaluated. Right Ventricle: The right ventricular size is moderately enlarged. No increase in right ventricular wall thickness. Right ventricular systolic function is mildly reduced. There is moderately elevated pulmonary artery systolic pressure. The tricuspid regurgitant velocity is 2.74 m/s, and with an assumed right atrial pressure of 15 mmHg, the estimated right ventricular systolic pressure is 0000000 mmHg. Left Atrium: Left atrial size was severely dilated. Right Atrium: Right atrial size was severely dilated. Pericardium: There is no evidence of pericardial effusion. Mitral Valve: The mitral valve is normal in structure. Mild to moderate mitral valve regurgitation. No evidence of mitral valve stenosis. Tricuspid Valve: The tricuspid valve is normal in structure. Tricuspid valve regurgitation is moderate . No evidence of tricuspid stenosis. Aortic Valve: The aortic valve is tricuspid. There is mild calcification of the aortic valve. Aortic valve regurgitation is mild to moderate. Aortic regurgitation PHT measures 426 msec. Aortic valve sclerosis/calcification is present, without any evidence of aortic stenosis. Pulmonic Valve: The pulmonic valve was normal in structure. Pulmonic valve regurgitation is not visualized. No evidence of pulmonic stenosis. Aorta: Aortic dilatation noted. There is borderline dilatation of the ascending aorta, measuring 37 mm. Venous: The inferior vena cava is dilated in size  with less than 50% respiratory variability, suggesting right atrial pressure of 15 mmHg. IAS/Shunts: No atrial level shunt detected by color flow Doppler. Additional Comments: There is pleural effusion in the left lateral region.  LEFT VENTRICLE PLAX 2D LVIDd:         3.80 cm     Diastology LVIDs:         2.30 cm     LV e' medial:    7.29 cm/s LV PW:         1.20 cm     LV E/e' medial:  16.3 LV IVS:        1.20 cm     LV e' lateral:   8.27 cm/s LVOT diam:     1.80 cm     LV E/e' lateral: 14.4 LV SV:         54 LV SV Index:   29 LVOT Area:     2.54 cm  LV Volumes (MOD) LV vol d, MOD A2C: 84.5 ml LV vol d, MOD A4C: 96.2 ml LV vol s, MOD A2C: 29.1 ml LV vol s, MOD A4C: 33.5 ml LV SV MOD A2C:     55.4 ml LV SV MOD A4C:     96.2 ml LV SV MOD BP:      61.8 ml RIGHT VENTRICLE             IVC RV Basal diam:  5.50 cm     IVC diam: 2.70 cm RV S prime:     10.70 cm/s TAPSE (M-mode): 2.1 cm LEFT ATRIUM              Index        RIGHT ATRIUM           Index LA diam:        4.10  cm  2.19 cm/m   RA Area:     34.20 cm LA Vol (A2C):   88.6 ml  47.37 ml/m  RA Volume:   119.00 ml 63.62 ml/m LA Vol (A4C):   101.0 ml 54.00 ml/m LA Biplane Vol: 96.5 ml  51.59 ml/m  AORTIC VALVE LVOT Vmax:   84.50 cm/s LVOT Vmean:  57.800 cm/s LVOT VTI:    0.213 m AI PHT:      426 msec  AORTA Ao Root diam: 3.40 cm Ao Asc diam:  3.70 cm MITRAL VALVE                TRICUSPID VALVE MV Area (PHT): 4.19 cm     TR Peak grad:   30.0 mmHg MV Decel Time: 181 msec     TR Vmax:        274.00 cm/s MR Peak grad: 80.3 mmHg MR Mean grad: 53.0 mmHg     SHUNTS MR Vmax:      448.00 cm/s   Systemic VTI:  0.21 m MR Vmean:     349.0 cm/s    Systemic Diam: 1.80 cm MV E velocity: 119.00 cm/s Glori Bickers MD Electronically signed by Glori Bickers MD Signature Date/Time: 01/01/2023/2:05:56 PM    Final     Cardiac Studies  TTE 01/01/2023  1. Left ventricular ejection fraction, by estimation, is 60 to 65%. The  left ventricle has normal function. The left  ventricle has no regional  wall motion abnormalities. There is mild concentric left ventricular  hypertrophy. Left ventricular diastolic  function could not be evaluated.   2. Right ventricular systolic function is mildly reduced. The right  ventricular size is moderately enlarged. There is moderately elevated  pulmonary artery systolic pressure. The estimated right ventricular  systolic pressure is 0000000 mmHg.   3. Left atrial size was severely dilated.   4. Right atrial size was severely dilated.   5. The mitral valve is normal in structure. Mild to moderate mitral valve  regurgitation. No evidence of mitral stenosis.   6. Tricuspid valve regurgitation is moderate.   7. The aortic valve is tricuspid. There is mild calcification of the  aortic valve. Aortic valve regurgitation is mild to moderate. Aortic valve  sclerosis/calcification is present, without any evidence of aortic  stenosis. Aortic regurgitation PHT  measures 426 msec.   8. Aortic dilatation noted. There is borderline dilatation of the  ascending aorta, measuring 37 mm.   9. The inferior vena cava is dilated in size with <50% respiratory  variability, suggesting right atrial pressure of 15 mmHg.   Patient Profile  Dawn Lawson is a 89 y.o. female with prediabetes, hypertension who was admitted on 01/01/2023 for acute hypoxic respiratory failure secondary to acute  diastolic heart failure.  Assessment & Plan   # Acute diastolic heart failure # Acute hypoxic respiratory failure # RV dysfunction/pulmonary edema -Remains grossly volume overloaded.  Kidney function is stable.  Will continue 80 mg of IV Lasix twice daily. -Will follow along for initiation of Aldactone.  Would hold given GFR. -Given advanced age unclear if she would benefit from SGLT2 inhibitor. -Will continue with conservative approach given advanced age.  # Elevated troponin # Demand ischemia -Demand.  No chest pain.  This is not acute coronary syndrome  and does not need to be treated as such. -Okay to continue aspirin.  Add back statin.  She takes 5 mg of Crestor Monday Wednesdays and Fridays.  LDL is at goal.  #  Atrial fibrillation -Rate controlled on no medications.  Seems to be a new diagnosis. -Stop heparin.  Add Eliquis 5 mg twice daily.  This is an appropriate dose based on her weight and normal kidney function.    For questions or updates, please contact [place] Please consult [email or web address] for contact info under        Signed, [place] T. Audie Box, MD, [place]  01/03/2023 10:00 AM

## 2023-01-03 NOTE — Progress Notes (Signed)
[place] for heparin Indication: atrial fibrillation  Allergies  Allergen Reactions   Penicillins Hives and Itching    Tolerated Ancef 11/17 and cefepime 02/2017  Has patient had a PCN reaction causing immediate rash, facial/tongue/throat swelling, SOB or lightheadedness with hypotension: Yes Has patient had a PCN reaction causing severe rash involving mucus membranes or skin necrosis: No Has patient had a PCN reaction that required hospitalization: No Has patient had a PCN reaction occurring within the last 10 years:  # # YES # #  If all of the above answers are "NO", then may proceed with Cephalosporin use.   Chocolate Other (See Comments)    Migraines    Tape Itching   Ampicillin Hives and Itching    Patient Measurements: Height: '5\' 2"'$  (157.5 cm) Weight: 79.8 kg (176 lb) IBW/kg (Calculated) : 50.1 Total Body Weight: 80.5 kg Heparin Dosing Weight: 68 kg  Vital Signs: Temp: 98.1 F (36.7 C) (03/13 0508) Temp Source: Oral (03/13 0508) BP: 109/45 (03/13 0508) Pulse Rate: 71 (03/13 0508)  Labs: Recent Labs    01/01/23 0955 01/01/23 0955 01/01/23 1231 01/02/23 0058 01/02/23 1107 01/02/23 1909 01/03/23 0504  HGB 10.9*  --   --  10.3*  --   --  10.9*  HCT 33.8*  --   --  31.7*  --   --  33.9*  PLT 247  --   --  247  --   --  231  LABPROT  --   --   --  15.3*  --   --   --   INR  --   --   --  1.2  --   --   --   HEPARINUNFRC  --    < >  --  0.29* 0.56 0.54 0.56  CREATININE 0.98  --   --  1.20*  --   --  1.19*  TROPONINIHS 554*  --  506*  --   --   --   --    < > = values in this interval not displayed.     Estimated Creatinine Clearance: 30.1 mL/min (A) (by C-G formula based on SCr of 1.19 mg/dL (H)).   Medical History: Past Medical History:  Diagnosis Date   AKI (acute kidney injury) ([facility]) 03/22/2014   Anemia    Arthritis    ATN (acute tubular necrosis) ([facility]) 03/22/2014   Cataract    Heart murmur    Hypertension     Lactic acidosis 03/22/2014   PONV (postoperative nausea and vomiting)    Pre-diabetes    Sepsis due to group B Streptococcus ([place]) 04/14/2014   Severe sepsis with acute organ dysfunction ([facility]) 03/22/2014   Status post PICC central line placement 04/14/2014   Urinary tract infection 03/22/2014    Medications: Not prescribed anticoagulants PTA  Assessment: Pt is a 11 yoF presenting with SOB. EKG obtained - pharmacy consulted to dose heparin for atrial fibrillation.  Baseline labs: CBC: Hgb slightly low, Plt WNL; SCr WNL  01/03/2023: Heparin level 0.56 remains therapeutic on heparin infusion of 1100 units/hr CBC: Hgb slightly low, Plt WNL SCr 1.19, CrCl 30 mL/min Confirmed with RN - no signs of bleeding or line issues.   Goal of Therapy:  Heparin level 0.3-0.7 units/ml Monitor platelets by anticoagulation protocol: Yes   Plan:  Continue heparin infusion at 1100 units/hr CBC, heparin level daily while on heparin Monitor for signs of bleeding Follow along for long term anticoagulation plan  Lenis Noon, PharmD 01/03/2023,7:11 AM

## 2023-01-04 DIAGNOSIS — I5031 Acute diastolic (congestive) heart failure: Secondary | ICD-10-CM

## 2023-01-04 LAB — CBC
HCT: 33.3 % — ABNORMAL LOW (ref 36.0–46.0)
Hemoglobin: 10.9 g/dL — ABNORMAL LOW (ref 12.0–15.0)
MCH: 28.5 pg (ref 26.0–34.0)
MCHC: 32.7 g/dL (ref 30.0–36.0)
MCV: 87.2 fL (ref 80.0–100.0)
Platelets: 248 10*3/uL (ref 150–400)
RBC: 3.82 MIL/uL — ABNORMAL LOW (ref 3.87–5.11)
RDW: 16 % — ABNORMAL HIGH (ref 11.5–15.5)
WBC: 6.6 10*3/uL (ref 4.0–10.5)
nRBC: 0 % (ref 0.0–0.2)

## 2023-01-04 LAB — GLUCOSE, CAPILLARY
Glucose-Capillary: 143 mg/dL — ABNORMAL HIGH (ref 70–99)
Glucose-Capillary: 180 mg/dL — ABNORMAL HIGH (ref 70–99)
Glucose-Capillary: 185 mg/dL — ABNORMAL HIGH (ref 70–99)
Glucose-Capillary: 176 mg/dL — ABNORMAL HIGH (ref 70–99)

## 2023-01-04 LAB — BASIC METABOLIC PANEL
Anion gap: 12 (ref 5–15)
BUN: 43 mg/dL — ABNORMAL HIGH (ref 8–23)
CO2: 30 mmol/L (ref 22–32)
Calcium: 8.4 mg/dL — ABNORMAL LOW (ref 8.9–10.3)
Chloride: 92 mmol/L — ABNORMAL LOW (ref 98–111)
Creatinine, Ser: 1.25 mg/dL — ABNORMAL HIGH (ref 0.44–1.00)
GFR, Estimated: 41 mL/min — ABNORMAL LOW (ref >60)
Glucose, Bld: 133 mg/dL — ABNORMAL HIGH (ref 70–99)
Potassium: 3.9 mmol/L (ref 3.5–5.1)
Sodium: 134 mmol/L — ABNORMAL LOW (ref 135–145)

## 2023-01-04 MED ORDER — ROSUVASTATIN CALCIUM 5 MG PO TABS
5.0000 mg | ORAL_TABLET | ORAL | Status: DC
  Administered 2023-01-05 – 2023-01-08 (×2): 5 mg via ORAL
  Filled 2023-01-04 (×3): qty 1

## 2023-01-04 NOTE — Progress Notes (Addendum)
Central tele called around 0600 and pt had 5 runs of VT, then back to A fib rhythm. RN checked pt right away and pt was resting peacefully, saying pt felt fine. Attending on-call made aware.

## 2023-01-04 NOTE — Plan of Care (Signed)
  Problem: Education: Goal: Knowledge of General Education information will improve Description Including pain rating scale, medication(s)/side effects and non-pharmacologic comfort measures Outcome: Progressing   Problem: Health Behavior/Discharge Planning: Goal: Ability to manage health-related needs will improve Outcome: Progressing   

## 2023-01-04 NOTE — Progress Notes (Addendum)
Rounding Note    Patient Name: Dawn Lawson Date of Encounter: 01/04/2023  [place] Cardiologist: Freada Bergeron, MD   Subjective   Patient reports that her breathing has improved since yesterday. Ankle swelling has improved. No chest pain   Inpatient Medications    Scheduled Meds:  apixaban  5 mg Oral BID   aspirin EC  81 mg Oral Daily   cephALEXin  500 mg Oral BID   furosemide  80 mg Intravenous BID   insulin aspart  0-9 Units Subcutaneous TID WC   predniSONE  40 mg Oral Q breakfast   [START ON 01/05/2023] rosuvastatin  5 mg Oral Q M,W,F   Continuous Infusions:  PRN Meds: acetaminophen **OR** acetaminophen, hydrALAZINE, ipratropium-albuterol, metoprolol tartrate, ondansetron **OR** ondansetron (ZOFRAN) IV, mouth rinse, senna-docusate, traZODone   Vital Signs    Vitals:   01/03/23 2037 01/04/23 0457 01/04/23 0529 01/04/23 0852  BP: (!) 119/41 (!) 131/52  (!) 126/35  Pulse: 77 75  60  Resp: '17 18  20  '$ Temp: 98 F (36.7 C) 98 F (36.7 C)  98.2 F (36.8 C)  TempSrc: Oral Oral  Oral  SpO2: 97% 97%  100%  Weight:   76.8 kg   Height:        Intake/Output Summary (Last 24 hours) at 01/04/2023 1106 Last data filed at 01/04/2023 1000 Gross per 24 hour  Intake 775 ml  Output 3550 ml  Net -2775 ml      01/04/2023    5:29 AM 01/03/2023    5:00 AM 01/02/2023    5:36 AM  Last 3 Weights  Weight (lbs) 169 lb 4.8 oz 176 lb 177 lb 6.4 oz  Weight (kg) 76.794 kg 79.833 kg 80.468 kg      Telemetry    Atrial fibrillation, HR in the 60s-70s - Personally Reviewed  ECG    No new tracings since 3/11 - Personally Reviewed  Physical Exam   GEN: No acute distress. Elderly female, laying in the bed with head elevated. Wearing [place]  Neck: No JVD Cardiac: Irregular rate and rhythm. no murmurs, rubs, or gallops. Radial pulses 2+ bilaterally  Respiratory: Crackles in bilateral lung bases, expiratory wheezing. Normal WOB on 1L via [place]  GI: Soft, nontender,  non-distended  MS: No edema in BLE; No deformity. Neuro:  Nonfocal  Psych: Normal affect   Labs    High Sensitivity Troponin:   Recent Labs  Lab 01/01/23 0955 01/01/23 1231  TROPONINIHS 554* 506*     Chemistry Recent Labs  Lab 01/01/23 0955 01/02/23 0058 01/03/23 0504 01/04/23 0513  NA 131* 128* 131* 134*  K 4.7 4.4 4.0 3.9  CL 98 95* 94* 92*  CO2 '25 25 27 30  '$ GLUCOSE 96 94 91 133*  BUN 36* 46* 46* 43*  CREATININE 0.98 1.20* 1.19* 1.25*  CALCIUM 8.2* 8.3* 8.3* 8.4*  PROT 5.6* 5.6*  --   --   ALBUMIN 3.1* 3.4*  --   --   AST 16 14*  --   --   ALT 11 13  --   --   ALKPHOS 48 47  --   --   BILITOT 0.7 0.7  --   --   GFRNONAA 54* 43* 43* 41*  ANIONGAP '8 8 10 12    '$ Lipids  Recent Labs  Lab 01/03/23 0504  CHOL 86  TRIG 52  HDL 32*  LDLCALC 44  CHOLHDL 2.7    Hematology Recent Labs  Lab  01/02/23 0058 01/03/23 0504 01/04/23 0513  WBC 13.1* 13.1* 6.6  RBC 3.58* 3.78* 3.82*  HGB 10.3* 10.9* 10.9*  HCT 31.7* 33.9* 33.3*  MCV 88.5 89.7 87.2  MCH 28.8 28.8 28.5  MCHC 32.5 32.2 32.7  RDW 15.9* 16.3* 16.0*  PLT 247 231 248   Thyroid  Recent Labs  Lab 01/01/23 1645  TSH 1.448    BNP Recent Labs  Lab 01/01/23 0955 01/03/23 0504  BNP 1,209.2* 518.5*    DDimer No results for input(s): "DDIMER" in the last 168 hours.   Radiology    No results found.  Cardiac Studies   Echocardiogram 01/01/2023  1. Left ventricular ejection fraction, by estimation, is 60 to 65%. The  left ventricle has normal function. The left ventricle has no regional  wall motion abnormalities. There is mild concentric left ventricular  hypertrophy. Left ventricular diastolic  function could not be evaluated.   2. Right ventricular systolic function is mildly reduced. The right  ventricular size is moderately enlarged. There is moderately elevated  pulmonary artery systolic pressure. The estimated right ventricular  systolic pressure is 0000000 mmHg.   3. Left atrial size was  severely dilated.   4. Right atrial size was severely dilated.   5. The mitral valve is normal in structure. Mild to moderate mitral valve  regurgitation. No evidence of mitral stenosis.   6. Tricuspid valve regurgitation is moderate.   7. The aortic valve is tricuspid. There is mild calcification of the  aortic valve. Aortic valve regurgitation is mild to moderate. Aortic valve  sclerosis/calcification is present, without any evidence of aortic  stenosis. Aortic regurgitation PHT  measures 426 msec.   8. Aortic dilatation noted. There is borderline dilatation of the  ascending aorta, measuring 37 mm.   9. The inferior vena cava is dilated in size with <50% respiratory  variability, suggesting right atrial pressure of 15 mmHg.   Patient Profile     89 y.o. female with prediabetes, hypertension who was admitted on 01/01/2023 for acute hypoxic respiratory failure secondary to acute  diastolic heart failure.   Assessment & Plan    Acute on chronic diastolic heart failure RV dysfunction Acute hypoxic respiratory failure -Patient was brought into the ED on 3/11 after the family noticed that she had been having increased weakness and decreased mobility for the past several weeks now.  Weakness had worsened significantly over the past week.  Also complained of shortness of breath - Initial BNP on 3/11 elevated to 1209.  Chest x-ray 3/11 showed CHF with small left pleural effusion - Echocardiogram this admission showed EF 60-65% with no regional wall motion abnormalities, mildly reduced RV systolic function -Patient has been diuresing on IV Lasix.  Dose was increased to 80 mg twice daily yesterday.  She put out 3.0 L urine yesterday and is currently net -3.84 liters since admission.  Weight down from 177.4 pounds on admission to 169.3 pounds.  Creatinine stable - Patient already received IV lasix 80 mg this AM. Hold evening dose. Suspect we can transition to PO lasix tomorrow.  - No SGLT2i given  patient's age. Can consider starting spironolactone prior to DC if renal function improves   Elevated troponin Demand ischemia - High sensitive troponin 554, 506 on admission -Patient is without chest pain. This is demand ischemia in the setting of decompensated heart failure. -Continue 5 mg Crestor on MWF - Stop ASA now that patient is on eliquis   Atrial fibrillation - Appears to be a  new diagnosis this admission - Continue Eliquis 5 mg twice daily-- currently, this is correct dose. Will recheck weight and renal function prior to DC  - HR well controlled, no need for AV nodal blocking medications   For questions or updates, please contact [place] Please consult [email or web address] for contact info under     Signed, Margie Billet, PA-C  01/04/2023, 11:06 AM    Patient seen and examined, note reviewed with the signed Advanced Practice Provider. I personally reviewed laboratory data, imaging studies and relevant notes. I independently examined the patient and formulated the important aspects of the plan. I have personally discussed the plan with the patient and/or family. Comments or changes to the note/plan are indicated below.  Clinically she appears to be euvolemic.  Agree with positioning the patient to p.o. diuretics. Troponin elevated but flat suspected in the setting of her acute decompensated heart failure no need for further ischemic evaluation at this time. Continue her Eliquis for stroke prevention.  Not currently on AV nodal blocking agent due to heart rate being mostly low normal.   Berniece Salines DO, MS [facility] Attending [place]  [address] [place], [place] [address] [phone] Website: [email or web address]

## 2023-01-04 NOTE — TOC Progression Note (Addendum)
Transition of Care [facility]) - Progression Note    Patient Details  Name: Dawn Lawson MRN: [id number] Date of Birth: 13-Feb-1929  Transition of Care [facility]) CM/SW Contact  Jarmel Linhardt, Juliann Pulse, RN Phone Number: 01/04/2023, 10:12 AM  Clinical Narrative: Await bed offers.   -2p bed offers given await choice.   Expected Discharge Plan: [place] Barriers to Discharge: Continued Medical Work up  Expected Discharge Plan and Services                                               Social Determinants of Health (SDOH) Interventions SDOH Screenings   Food Insecurity: No Food Insecurity (01/01/2023)  Housing: Low Risk  (01/01/2023)  Transportation Needs: No Transportation Needs (01/01/2023)  Utilities: Not At Risk (01/01/2023)  Alcohol Screen: Low Risk  (10/12/2022)  Depression (PHQ2-9): Low Risk  (11/27/2022)  Financial Resource Strain: Low Risk  (10/12/2022)  Physical Activity: Sufficiently Active (10/12/2022)  Social Connections: Socially Isolated (10/12/2022)  Stress: No Stress Concern Present (10/12/2022)  Tobacco Use: Medium Risk (01/01/2023)    Readmission Risk Interventions     No data to display

## 2023-01-04 NOTE — Discharge Instructions (Signed)
Information on my medicine - ELIQUIS (apixaban)  This medication education was reviewed with me or my healthcare representative as part of my discharge preparation.  The pharmacist that spoke with me during my hospital stay was:  Molly Maduro, Student-PharmD  Why was Eliquis prescribed for you? Eliquis was prescribed for you to reduce the risk of a blood clot forming that can cause a stroke if you have a medical condition called atrial fibrillation (a type of irregular heartbeat).  What do You need to know about Eliquis ? Take your Eliquis TWICE DAILY - one tablet in the morning and one tablet in the evening with or without food. If you have difficulty swallowing the tablet whole please discuss with your pharmacist how to take the medication safely.  Take Eliquis exactly as prescribed by your doctor and DO NOT stop taking Eliquis without talking to the doctor who prescribed the medication.  Stopping may increase your risk of developing a stroke.  Refill your prescription before you run out.  After discharge, you should have regular check-up appointments with your healthcare provider that is prescribing your Eliquis.  In the future your dose may need to be changed if your kidney function or weight changes by a significant amount or as you get older.  What do you do if you miss a dose? If you miss a dose, take it as soon as you remember on the same day and resume taking twice daily.  Do not take more than one dose of ELIQUIS at the same time to make up a missed dose.  Important Safety Information A possible side effect of Eliquis is bleeding. You should call your healthcare provider right away if you experience any of the following: Bleeding from an injury or your nose that does not stop. Unusual colored urine (red or dark brown) or unusual colored stools (red or black). Unusual bruising for unknown reasons. A serious fall or if you hit your head (even if there is no bleeding).  Some  medicines may interact with Eliquis and might increase your risk of bleeding or clotting while on Eliquis. To help avoid this, consult your healthcare provider or pharmacist prior to using any new prescription or non-prescription medications, including herbals, vitamins, non-steroidal anti-inflammatory drugs (NSAIDs) and supplements.  This website has more information on Eliquis (apixaban): http://www.eliquis.com/eliquis/home

## 2023-01-04 NOTE — Progress Notes (Signed)
PROGRESS NOTE    ERMON SARVIS  [id number] DOB: Dec 01, 1928 DOA: 01/01/2023 PCP: [name], NP   Brief Narrative:    89 y.o. female with medical history significant of Pre-diabetes, arthritis, Hypertensive heart disease, Grade II diastolic dysfunction in setting of moderate asymmetric left ventricular hypertrophy of the basal-septal segment with EF of 65% noted on echo 2021, with in history of chronic b/l lower extremity swelling and interim history of several months of  progressive weakness with sharp decrease of mobility over last 1 week.  Patient lives alone with help from family as needed. In the ER, found to have a fib and suspected CHF exacerbation.  Patient is getting diuresed with direction from cardiology.  Assessment & Plan:  Principal Problem:   CHF (congestive heart failure) (HCC)    Acute on chronic diastolic heart failure exacerbation Acute respiratory distress secondary to CHF exacerbation -Echocardiogram shows ejection fraction 65% with moderately enlarged RV and elevated PASP.  Continue diuretics, monitor urine output.  Plans to transition to p.o. Lasix tomorrow.  Replete electrolytes as necessary.  Elevated troponin, NSTEMI - Currently on Crestor.  Eliquis twice daily started.  Stopping aspirin.  Abnormal breath sounds - Likely from fluid overload but some rhonchi as well.  Does have previous history of smoking but no formal diagnosis of COPD.  Steroids and bronchodilators.   New Onset Afib - Currently on Eliquis per cardiology    Pre-diabetes -A1c 6.22 Sep 2022.  Closely monitor   Hypertension Stable blood pressure   Debility  PT/OT, uses walker at home   Obesity Estimated body mass index is 32.45 kg/m as calculated from the following:   Height as of this encounter: '5\' 2"'$  (1.575 m).   Weight as of this encounter: 80.5 kg.    lipedema with h/o tibia osteo -Chronic knee on Keflex at home  PT/OT-SNF  DVT prophylaxis: Hep Drip > Eliquis Code  Status: DNR Family Communication: Son at bedside  Status is: Inpatient Ongoing management for diastolic CHF and abnormal breath sounds.  Continue hospital stay for at least next 1-2 days Nutritional status     Subjective: Feeling okay no complaints.  Diuresed well in last 24 hours  Examination:  General exam: Appears calm and comfortable  Respiratory system: Diffuse bilateral abnormal breath sounds Cardiovascular system: S1 & S2 heard, RRR. No JVD, murmurs, rubs, gallops or clicks. No pedal edema. Gastrointestinal system: Abdomen is nondistended, soft and nontender. No organomegaly or masses felt. Normal bowel sounds heard. Central nervous system: Alert and oriented. No focal neurological deficits. Extremities: Symmetric 5 x 5 power. Skin: No rashes, lesions or ulcers Psychiatry: Judgement and insight appear normal. Mood & affect appropriate.     Objective: Vitals:   01/03/23 2037 01/04/23 0457 01/04/23 0529 01/04/23 0852  BP: (!) 119/41 (!) 131/52  (!) 126/35  Pulse: 77 75  60  Resp: '17 18  20  '$ Temp: 98 F (36.7 C) 98 F (36.7 C)  98.2 F (36.8 C)  TempSrc: Oral Oral  Oral  SpO2: 97% 97%  100%  Weight:   76.8 kg   Height:        Intake/Output Summary (Last 24 hours) at 01/04/2023 1139 Last data filed at 01/04/2023 1000 Gross per 24 hour  Intake 535 ml  Output 2950 ml  Net -2415 ml   Filed Weights   01/02/23 0536 01/03/23 0500 01/04/23 0529  Weight: 80.5 kg 79.8 kg 76.8 kg     Data Reviewed:   CBC: Recent Labs  Lab 01/01/23 0955 01/02/23 0058 01/03/23 0504 01/04/23 0513  WBC 10.6* 13.1* 13.1* 6.6  HGB 10.9* 10.3* 10.9* 10.9*  HCT 33.8* 31.7* 33.9* 33.3*  MCV 89.9 88.5 89.7 87.2  PLT 247 247 231 [id number]   Basic Metabolic Panel: Recent Labs  Lab 01/01/23 0955 01/02/23 0058 01/03/23 0504 01/04/23 0513  NA 131* 128* 131* 134*  K 4.7 4.4 4.0 3.9  CL 98 95* 94* 92*  CO2 '25 25 27 30  '$ GLUCOSE 96 94 91 133*  BUN 36* 46* 46* 43*  CREATININE 0.98  1.20* 1.19* 1.25*  CALCIUM 8.2* 8.3* 8.3* 8.4*   GFR: Estimated Creatinine Clearance: 28.1 mL/min (A) (by C-G formula based on SCr of 1.25 mg/dL (H)). Liver Function Tests: Recent Labs  Lab 01/01/23 0955 01/02/23 0058  AST 16 14*  ALT 11 13  ALKPHOS 48 47  BILITOT 0.7 0.7  PROT 5.6* 5.6*  ALBUMIN 3.1* 3.4*   No results for input(s): "LIPASE", "AMYLASE" in the last 168 hours. No results for input(s): "AMMONIA" in the last 168 hours. Coagulation Profile: Recent Labs  Lab 01/02/23 0058  INR 1.2   Cardiac Enzymes: No results for input(s): "CKTOTAL", "CKMB", "CKMBINDEX", "TROPONINI" in the last 168 hours. BNP (last 3 results) No results for input(s): "PROBNP" in the last 8760 hours. HbA1C: No results for input(s): "HGBA1C" in the last 72 hours. CBG: Recent Labs  Lab 01/03/23 1138 01/03/23 1653 01/03/23 2041 01/04/23 0720 01/04/23 1113  GLUCAP 139* 146* 219* 143* 180*   Lipid Profile: Recent Labs    01/03/23 0504  CHOL 86  HDL 32*  LDLCALC 44  TRIG 52  CHOLHDL 2.7   Thyroid Function Tests: Recent Labs    01/01/23 1645  TSH 1.448   Anemia Panel: No results for input(s): "VITAMINB12", "FOLATE", "FERRITIN", "TIBC", "IRON", "RETICCTPCT" in the last 72 hours. Sepsis Labs: Recent Labs  Lab 01/02/23 0754 01/02/23 1107  LATICACIDVEN 1.5 1.9    Recent Results (from the past 240 hour(s))  Resp panel by RT-PCR (RSV, Flu A&B, Covid) Anterior Nasal Swab     Status: None   Collection Time: 01/01/23  9:23 AM   Specimen: Anterior Nasal Swab  Result Value Ref Range Status   SARS Coronavirus 2 by RT PCR NEGATIVE NEGATIVE Final    Comment: (NOTE) SARS-CoV-2 target nucleic acids are NOT DETECTED.  The SARS-CoV-2 RNA is generally detectable in upper respiratory specimens during the acute phase of infection. The lowest concentration of SARS-CoV-2 viral copies this assay can detect is 138 copies/mL. A negative result does not preclude SARS-Cov-2 infection and should  not be used as the sole basis for treatment or other patient management decisions. A negative result may occur with  improper specimen collection/handling, submission of specimen other than nasopharyngeal swab, presence of viral mutation(s) within the areas targeted by this assay, and inadequate number of viral copies(<138 copies/mL). A negative result must be combined with clinical observations, patient history, and epidemiological information. The expected result is Negative.  Fact Sheet for Patients:  EntrepreneurPulse.com.au  Fact Sheet for Healthcare Providers:  IncredibleEmployment.be  This test is no t yet approved or cleared by the [place] FDA and  has been authorized for detection and/or diagnosis of SARS-CoV-2 by FDA under an Emergency Use Authorization (EUA). This EUA will remain  in effect (meaning this test can be used) for the duration of the COVID-19 declaration under Section 564(b)(1) of the Act, 21 U.S.C.section 360bbb-3(b)(1), unless the authorization is terminated  or revoked sooner.  Influenza A by PCR NEGATIVE NEGATIVE Final   Influenza B by PCR NEGATIVE NEGATIVE Final    Comment: (NOTE) The Xpert Xpress SARS-CoV-2/FLU/RSV plus assay is intended as an aid in the diagnosis of influenza from Nasopharyngeal swab specimens and should not be used as a sole basis for treatment. Nasal washings and aspirates are unacceptable for Xpert Xpress SARS-CoV-2/FLU/RSV testing.  Fact Sheet for Patients: EntrepreneurPulse.com.au  Fact Sheet for Healthcare Providers: IncredibleEmployment.be  This test is not yet approved or cleared by the [place] FDA and has been authorized for detection and/or diagnosis of SARS-CoV-2 by FDA under an Emergency Use Authorization (EUA). This EUA will remain in effect (meaning this test can be used) for the duration of the COVID-19 declaration under  Section 564(b)(1) of the Act, 21 U.S.C. section 360bbb-3(b)(1), unless the authorization is terminated or revoked.     Resp Syncytial Virus by PCR NEGATIVE NEGATIVE Final    Comment: (NOTE) Fact Sheet for Patients: EntrepreneurPulse.com.au  Fact Sheet for Healthcare Providers: IncredibleEmployment.be  This test is not yet approved or cleared by the [place] FDA and has been authorized for detection and/or diagnosis of SARS-CoV-2 by FDA under an Emergency Use Authorization (EUA). This EUA will remain in effect (meaning this test can be used) for the duration of the COVID-19 declaration under Section 564(b)(1) of the Act, 21 U.S.C. section 360bbb-3(b)(1), unless the authorization is terminated or revoked.  Performed at [facility], [place] [address], [place], [place] [address]          Radiology Studies: No results found.      Scheduled Meds:  apixaban  5 mg Oral BID   cephALEXin  500 mg Oral BID   insulin aspart  0-9 Units Subcutaneous TID WC   predniSONE  40 mg Oral Q breakfast   [START ON 01/05/2023] rosuvastatin  5 mg Oral Q M,W,F   Continuous Infusions:     LOS: 3 days   Time spent= 35 mins    Dorthy Magnussen Arsenio Loader, MD [facility]  If 7PM-7AM, please contact night-coverage  01/04/2023, 11:39 AM

## 2023-01-05 ENCOUNTER — Inpatient Hospital Stay (HOSPITAL_COMMUNITY): Payer: Medicare Other

## 2023-01-05 DIAGNOSIS — I5031 Acute diastolic (congestive) heart failure: Secondary | ICD-10-CM | POA: Diagnosis not present

## 2023-01-05 LAB — GLUCOSE, CAPILLARY
Glucose-Capillary: 119 mg/dL — ABNORMAL HIGH (ref 70–99)
Glucose-Capillary: 151 mg/dL — ABNORMAL HIGH (ref 70–99)
Glucose-Capillary: 212 mg/dL — ABNORMAL HIGH (ref 70–99)
Glucose-Capillary: 177 mg/dL — ABNORMAL HIGH (ref 70–99)

## 2023-01-05 LAB — CBC
HCT: 32.4 % — ABNORMAL LOW (ref 36.0–46.0)
Hemoglobin: 10.6 g/dL — ABNORMAL LOW (ref 12.0–15.0)
MCH: 29 pg (ref 26.0–34.0)
MCHC: 32.7 g/dL (ref 30.0–36.0)
MCV: 88.8 fL (ref 80.0–100.0)
Platelets: 247 10*3/uL (ref 150–400)
RBC: 3.65 MIL/uL — ABNORMAL LOW (ref 3.87–5.11)
RDW: 16.2 % — ABNORMAL HIGH (ref 11.5–15.5)
WBC: 11.5 10*3/uL — ABNORMAL HIGH (ref 4.0–10.5)
nRBC: 0 % (ref 0.0–0.2)

## 2023-01-05 LAB — BASIC METABOLIC PANEL
Anion gap: 13 (ref 5–15)
BUN: 51 mg/dL — ABNORMAL HIGH (ref 8–23)
CO2: 28 mmol/L (ref 22–32)
Calcium: 8.4 mg/dL — ABNORMAL LOW (ref 8.9–10.3)
Chloride: 96 mmol/L — ABNORMAL LOW (ref 98–111)
Creatinine, Ser: 1.22 mg/dL — ABNORMAL HIGH (ref 0.44–1.00)
GFR, Estimated: 42 mL/min — ABNORMAL LOW (ref >60)
Glucose, Bld: 170 mg/dL — ABNORMAL HIGH (ref 70–99)
Potassium: 3.9 mmol/L (ref 3.5–5.1)
Sodium: 137 mmol/L (ref 135–145)

## 2023-01-05 LAB — MAGNESIUM: Magnesium: 2 mg/dL (ref 1.7–2.4)

## 2023-01-05 MED ORDER — FUROSEMIDE 40 MG PO TABS
40.0000 mg | ORAL_TABLET | Freq: Every day | ORAL | Status: DC
  Administered 2023-01-05 – 2023-01-08 (×4): 40 mg via ORAL
  Filled 2023-01-05 (×4): qty 1

## 2023-01-05 NOTE — Progress Notes (Addendum)
Rounding Note    Patient Name: Dawn Lawson Date of Encounter: 01/05/2023  [place] Cardiologist: Freada Bergeron, MD   Subjective   Patient reports that she is feeling good today. No chest pain, breathing has improved, no ankle edema. Family is at bedside, and they are concerned that patient has not been getting out of bed.   Inpatient Medications    Scheduled Meds:  apixaban  5 mg Oral BID   cephALEXin  500 mg Oral BID   insulin aspart  0-9 Units Subcutaneous TID WC   predniSONE  40 mg Oral Q breakfast   rosuvastatin  5 mg Oral Q M,W,F   Continuous Infusions:  PRN Meds: acetaminophen **OR** acetaminophen, hydrALAZINE, ipratropium-albuterol, metoprolol tartrate, ondansetron **OR** ondansetron (ZOFRAN) IV, mouth rinse, senna-docusate, traZODone   Vital Signs    Vitals:   01/04/23 1321 01/04/23 2011 01/05/23 0448 01/05/23 0759  BP: (!) 131/38 (!) 146/47 (!) 148/50   Pulse: 77 71 80   Resp: 14 20 20    Temp: 97.6 F (36.4 C) 98.2 F (36.8 C) 97.7 F (36.5 C)   TempSrc: Oral Oral    SpO2: 100% 100% 98%   Weight:    77.4 kg  Height:        Intake/Output Summary (Last 24 hours) at 01/05/2023 0913 Last data filed at 01/05/2023 0600 Gross per 24 hour  Intake 795 ml  Output 1650 ml  Net -855 ml      01/05/2023    7:59 AM 01/04/2023    5:29 AM 01/03/2023    5:00 AM  Last 3 Weights  Weight (lbs) 170 lb 9.6 oz 169 lb 4.8 oz 176 lb  Weight (kg) 77.384 kg 76.794 kg 79.833 kg      Telemetry    Atrial fibrillation, HR in the 60s-70s - Personally Reviewed  ECG    No new tracings since 3/11 - Personally Reviewed  Physical Exam   GEN: Elderly female, sitting upright in the bed. No acute distress  Neck: No JVD Cardiac: Irregular rate and rhythm, radial pulses 2+ bilaterally  Respiratory: Clear to auscultation bilaterally. Normal WOB on 1 L supplemental oxygen via [place]  GI: Soft, nontender, non-distended  MS: No edema in BLE; No deformity.  Bilateral lower extremities are tender to palpation  Neuro:  Nonfocal  Psych: Normal affect   Labs    High Sensitivity Troponin:   Recent Labs  Lab 01/01/23 0955 01/01/23 1231  TROPONINIHS 554* 506*     Chemistry Recent Labs  Lab 01/01/23 0955 01/02/23 0058 01/03/23 0504 01/04/23 0513 01/05/23 0523  NA 131* 128* 131* 134* 137  K 4.7 4.4 4.0 3.9 3.9  CL 98 95* 94* 92* 96*  CO2 25 25 27 30 28   GLUCOSE 96 94 91 133* 170*  BUN 36* 46* 46* 43* 51*  CREATININE 0.98 1.20* 1.19* 1.25* 1.22*  CALCIUM 8.2* 8.3* 8.3* 8.4* 8.4*  MG  --   --   --   --  2.0  PROT 5.6* 5.6*  --   --   --   ALBUMIN 3.1* 3.4*  --   --   --   AST 16 14*  --   --   --   ALT 11 13  --   --   --   ALKPHOS 48 47  --   --   --   BILITOT 0.7 0.7  --   --   --   GFRNONAA 54* 43* 43* 41*  42*  ANIONGAP 8 8 10 12 13     Lipids  Recent Labs  Lab 01/03/23 0504  CHOL 86  TRIG 52  HDL 32*  LDLCALC 44  CHOLHDL 2.7    Hematology Recent Labs  Lab 01/03/23 0504 01/04/23 0513 01/05/23 0523  WBC 13.1* 6.6 11.5*  RBC 3.78* 3.82* 3.65*  HGB 10.9* 10.9* 10.6*  HCT 33.9* 33.3* 32.4*  MCV 89.7 87.2 88.8  MCH 28.8 28.5 29.0  MCHC 32.2 32.7 32.7  RDW 16.3* 16.0* 16.2*  PLT 231 248 247   Thyroid  Recent Labs  Lab 01/01/23 1645  TSH 1.448    BNP Recent Labs  Lab 01/01/23 0955 01/03/23 0504  BNP 1,209.2* 518.5*    DDimer No results for input(s): "DDIMER" in the last 168 hours.   Radiology    No results found.  Cardiac Studies   Echocardiogram 01/01/2023  1. Left ventricular ejection fraction, by estimation, is 60 to 65%. The  left ventricle has normal function. The left ventricle has no regional  wall motion abnormalities. There is mild concentric left ventricular  hypertrophy. Left ventricular diastolic  function could not be evaluated.   2. Right ventricular systolic function is mildly reduced. The right  ventricular size is moderately enlarged. There is moderately elevated  pulmonary  artery systolic pressure. The estimated right ventricular  systolic pressure is 0000000 mmHg.   3. Left atrial size was severely dilated.   4. Right atrial size was severely dilated.   5. The mitral valve is normal in structure. Mild to moderate mitral valve  regurgitation. No evidence of mitral stenosis.   6. Tricuspid valve regurgitation is moderate.   7. The aortic valve is tricuspid. There is mild calcification of the  aortic valve. Aortic valve regurgitation is mild to moderate. Aortic valve  sclerosis/calcification is present, without any evidence of aortic  stenosis. Aortic regurgitation PHT  measures 426 msec.   8. Aortic dilatation noted. There is borderline dilatation of the  ascending aorta, measuring 37 mm.   9. The inferior vena cava is dilated in size with <50% respiratory  variability, suggesting right atrial pressure of 15 mmHg.   Patient Profile     89 y.o. female  with prediabetes, hypertension who was admitted on 01/01/2023 for acute hypoxic respiratory failure secondary to acute  diastolic heart failure.   Assessment & Plan    Acute on chronic diastolic heart failure RV dysfunction Acute hypoxic respiratory failure - Patient was brought into the ED on 3/11 after the family noticed that she had been having increased weakness and decreased mobility for the past several weeks now.  Weakness had worsened significantly over the past week.  Also complained of shortness of breath - Initial BNP on 3/11 elevated to 1209.  Chest x-ray 3/11 showed CHF with small left pleural effusion. BNP 3/13 down to 518.5 - Echocardiogram this admission showed EF 60-65% with no regional wall motion abnormalities, mildly reduced RV systolic function - Patient has been diuresing on IV Lasix.  Output 2.05 L urine yesterday and is currently net -4.62 L since admission. Weight down 7 lbs. Lungs clear on exam  - Start PO lasix 40 mg daily  - No SGLT2i given patient's age. Can consider starting  spironolactone prior to DC if renal function improves    Elevated troponin Demand ischemia - High sensitive troponin 554, 506 on admission -Patient is without chest pain. Suspect this is demand ischemia in the setting of decompensated heart failure. -Continue 5 mg  Crestor on MWF - Stop ASA now that patient is on eliquis    Atrial fibrillation - Appears to be a new diagnosis this admission - Continue Eliquis 5 mg twice daily-- currently, this is correct dose. Will recheck weight and renal function prior to DC  - HR well controlled, no need for AV nodal blocking medications   For questions or updates, please contact [place] Please consult [email or web address] for contact info under        Signed, Margie Billet, PA-C  01/05/2023, 9:13 AM    Personally seen and examined. Agree with above.  89 year old with acute diastolic heart failure, hypoxic respiratory failure admitted on 01/01/2023.  Increased weakness shortness of breath BNP 1209 small pleural effusion, ejection fraction 65%, diuresing with IV Lasix.  She is now on p.o. Lasix 40 mg a day.  We are avoiding SGLT2 inhibitor given her age.  Had a troponin of 554 suspect this was acute myocardial injury in the setting of decompensated heart failure.  Continuing with Crestor Monday was a Friday and Eliquis.  She is stopping her aspirin.  Atrial fibrillation appears to be a new diagnosis.  We placed her on Eliquis.  Unfortunately she developed a inner thigh hematoma 6 x 12 cm seen on CT scan.  Eliquis has been stopped appropriately.  We will avoid anticoagulation given her easy bleeding properties.  I would not even be opposed to her staying off of low-dose aspirin as well.  Did discuss with the family that without anticoagulation, there is an increased risk for stroke.  However risk of bleeding outweighs the risk at this point.    Candee Furbish, MD

## 2023-01-05 NOTE — TOC Progression Note (Signed)
Transition of Care [facility]) - Progression Note    Patient Details  Name: Dawn Lawson MRN: [id number] Date of Birth: [birth year]  [facility]) CM/SW Contact  Andri Prestia, Juliann Pulse, RN Phone Number: 01/05/2023, 2:22 PM  Clinical Narrative: [facility]/[name](son) chose [facility], bed available Monday.MD updated.      Expected Discharge Plan: [place] Barriers to Discharge: Continued Medical Work up  Expected Discharge Plan and Services                                               Social Determinants of Health (SDOH) Interventions SDOH Screenings   Food Insecurity: No Food Insecurity (01/01/2023)  Housing: Low Risk  (01/01/2023)  Transportation Needs: No Transportation Needs (01/01/2023)  Utilities: Not At Risk (01/01/2023)  Alcohol Screen: Low Risk  (10/12/2022)  Depression (PHQ2-9): Low Risk  (11/27/2022)  Financial Resource Strain: Low Risk  (10/12/2022)  Physical Activity: Sufficiently Active (10/12/2022)  Social Connections: Socially Isolated (10/12/2022)  Stress: No Stress Concern Present (10/12/2022)  Tobacco Use: Medium Risk (01/01/2023)    Readmission Risk Interventions     No data to display

## 2023-01-05 NOTE — Plan of Care (Signed)
  Problem: Clinical Measurements: Goal: Cardiovascular complication will be avoided Outcome: Progressing   Problem: Activity: Goal: Risk for activity intolerance will decrease Outcome: Progressing   Problem: Education: Goal: Knowledge of General Education information will improve Description: Including pain rating scale, medication(s)/side effects and non-pharmacologic comfort measures Outcome: Completed/Met   

## 2023-01-05 NOTE — Progress Notes (Signed)
Physical Therapy Treatment Patient Details Name: Dawn Lawson MRN: [id number] DOB: January 14, 1929 Today's Date: 01/05/2023   History of Present Illness Patient is a 89 year old female who presented with 1 week history of progressive weakness and decreased mobility. Patient was admitted with A fib, and CHF exacerbation. PMH: Pre-diabetes, arthritis, Hypertensive heart disease, Grade II diastolic dysfunction in setting of moderate asymmetric left ventricular hypertrophy of the basal-septal segment with EF of 65% noted on echo 2021,    PT Comments    Pt requiring some encouragement to self assist today.  Pt total assist +2 for coming to EOB however min-mod +2 for standing and transfers.  Follow up d/c recommendation for SNF remains appropriate.  Recommendations for follow up therapy are one component of a multi-disciplinary discharge planning process, led by the attending physician.  Recommendations may be updated based on patient status, additional functional criteria and insurance authorization.  Follow Up Recommendations  Skilled nursing-short term rehab (<3 hours/day) Can patient physically be transported by private vehicle: No   Assistance Recommended at Discharge Frequent or constant Supervision/Assistance  Patient can return home with the following A lot of help with walking and/or transfers;A lot of help with bathing/dressing/bathroom;Assistance with cooking/housework;Help with stairs or ramp for entrance   Equipment Recommendations  None recommended by PT    Recommendations for Other Services       Precautions / Restrictions Precautions Precautions: Fall Precaution Comments: right knee fused     Mobility  Bed Mobility Overal bed mobility: Needs Assistance Bed Mobility: Supine to Sit     Supine to sit: +2 for physical assistance, Total assist, HOB elevated     General bed mobility comments: pt not initiating despite cues for self assist, utilized bed pads for positioning  to EOB    Transfers Overall transfer level: Needs assistance Equipment used: Rolling walker (2 wheels) Transfers: Sit to/from Stand, Bed to chair/wheelchair/BSC Sit to Stand: Mod assist, +2 safety/equipment   Step pivot transfers: Min assist, +2 safety/equipment       General transfer comment: assist to rise, pt held onto RW so RW held down for pt to self assist safely, once pt standing able to remain upright min/guard, min assist for stability with taking a few steps over to recliner, pt remained on 4L O2 [place] however does not appear SOB, RN states pt only on 1L this morning, SPO2 not reading well (RN in room during and after session)    Ambulation/Gait                   Stairs             Wheelchair Mobility    Modified Rankin (Stroke Patients Only)       Balance Overall balance assessment: History of Falls, Needs assistance       Postural control: Posterior lean Standing balance support: Bilateral upper extremity supported, Reliant on assistive device for balance, During functional activity Standing balance-Leahy Scale: Poor Standing balance comment: reliant on UE support                            Cognition Arousal/Alertness: Awake/alert Behavior During Therapy: WFL for tasks assessed/performed Overall Cognitive Status: History of cognitive impairments - at baseline  Exercises      General Comments        Pertinent Vitals/Pain Pain Assessment Pain Assessment: Faces Faces Pain Scale: Hurts whole lot Pain Location: both legs to touch and move the legs Pain Descriptors / Indicators: Discomfort, Moaning, Guarding, Grimacing Pain Intervention(s): Repositioned, Monitored during session    Home Living                          Prior Function            PT Goals (current goals can now be found in the care plan section) Progress towards PT goals: Progressing toward  goals    Frequency    Min 2X/week      PT Plan Current plan remains appropriate    Co-evaluation              AM-PAC PT "6 Clicks" Mobility   Outcome Measure  Help needed turning from your back to your side while in a flat bed without using bedrails?: Total Help needed moving from lying on your back to sitting on the side of a flat bed without using bedrails?: Total Help needed moving to and from a bed to a chair (including a wheelchair)?: A Lot Help needed standing up from a chair using your arms (e.g., wheelchair or bedside chair)?: A Lot Help needed to walk in hospital room?: A Lot Help needed climbing 3-5 steps with a railing? : Total 6 Click Score: 9    End of Session Equipment Utilized During Treatment: Gait belt;Oxygen Activity Tolerance: Patient limited by pain;Patient limited by fatigue Patient left: in chair;with call bell/phone within reach;with chair alarm set;with family/visitor present;with nursing/sitter in room Nurse Communication: Mobility status PT Visit Diagnosis: Muscle weakness (generalized) (M62.81);Difficulty in walking, not elsewhere classified (R26.2)     Time: [id number] PT Time Calculation (min) (ACUTE ONLY): 25 min  Charges:  $Therapeutic Activity: 23-37 mins                    Jannette Spanner PT, DPT Physical Therapist Acute Rehabilitation Services Preferred contact method: Secure Chat Weekend Pager Only: [phone] Office: [place] 01/05/2023, 4:15 PM

## 2023-01-05 NOTE — Progress Notes (Signed)
PROGRESS NOTE    Dawn Lawson  [id number] DOB: 11/12/1928 DOA: 01/01/2023 PCP: [name], NP   Brief Narrative:    89 y.o. female with medical history significant of Pre-diabetes, arthritis, Hypertensive heart disease, Grade II diastolic dysfunction in setting of moderate asymmetric left ventricular hypertrophy of the basal-septal segment with EF of 65% noted on echo 2021, with in history of chronic b/l lower extremity swelling and interim history of several months of  progressive weakness with sharp decrease of mobility over last 1 week.  Patient lives alone with help from family as needed. In the ER, found to have a fib and suspected CHF exacerbation.  Patient is getting diuresed with direction from cardiology.  Assessment & Plan:  Principal Problem:   CHF (congestive heart failure) (HCC) Active Problems:   Acute diastolic heart failure (HCC)    Acute on chronic diastolic heart failure exacerbation Acute respiratory distress secondary to CHF exacerbation -Echocardiogram shows ejection fraction 65% with moderately enlarged RV and elevated PASP.  Continue diuretics, monitor urine output.  Replete electrolytes as needed, p.o. Lasix 40 mg daily.  Left thigh hematoma with ecchymosis - Tender to palpation, erythema and ecchymosis.  Will order CT of the left thigh to rule out any hematoma.  Hold off on Eliquis.  Warm compresses  Elevated troponin, NSTEMI - Currently on Crestor.  I have encouraged patient and family to try and avoid being on long-term anticoagulation given high risk of fall, unsteady gait and age.  Also she has ecchymosis and hematoma from left thigh. Resume aspirin if the CT is negative  Abnormal breath sounds - Likely from fluid overload but some rhonchi as well.  Does have previous history of smoking but no formal diagnosis of COPD.  Steroids and bronchodilators.   New Onset Afib - Avoid anticoagulation given high risk of fall.  Would opt for rate control  strategy and avoid long-term anticoagulation.  Family in agreement.    Pre-diabetes -A1c 6.22 Sep 2022.  Closely monitor   Hypertension Stable blood pressure   Debility  PT/OT, uses walker at home   Obesity Estimated body mass index is 32.45 kg/m as calculated from the following:   Height as of this encounter: 5\' 2"  (1.575 m).   Weight as of this encounter: 80.5 kg.    lipedema with h/o tibia osteo -Chronic knee on Keflex at home  PT/OT-SNF  DVT prophylaxis: Start subcu DVT prophylaxis of CTF left thigh is negative Code Status: DNR Family Communication: Son at bedside  Status is: Inpatient Ongoing management for diastolic CHF and abnormal breath sounds.  Continue hospital stay for at least next 1-2 days Nutritional status     Subjective: Patient with warding of left thigh swelling, pain and ecchymosis.  Patient family are both in agreement that they should not be on long-term anticoagulation given her advanced age, debility issue and high risk of bleeding.  Examination:  Constitutional: Not in acute distress Respiratory: Bibasilar rhonchi Cardiovascular: Normal sinus rhythm, no rubs Abdomen: Nontender nondistended good bowel sounds Musculoskeletal: No edema noted Skin: Left thigh ecchymosis and erythema on the inner side.  Tender to palpation Neurologic: CN 2-12 grossly intact.  And nonfocal Psychiatric: Normal judgment and insight. Alert and oriented x 3. Normal mood.  Objective: Vitals:   01/04/23 1321 01/04/23 2011 01/05/23 0448 01/05/23 0759  BP: (!) 131/38 (!) 146/47 (!) 148/50   Pulse: 77 71 80   Resp: 14 20 20    Temp: 97.6 F (36.4 C) 98.2 F (  36.8 C) 97.7 F (36.5 C)   TempSrc: Oral Oral    SpO2: 100% 100% 98%   Weight:    77.4 kg  Height:        Intake/Output Summary (Last 24 hours) at 01/05/2023 1151 Last data filed at 01/05/2023 1000 Gross per 24 hour  Intake 1080 ml  Output 1500 ml  Net -420 ml   Filed Weights   01/03/23 0500 01/04/23  0529 01/05/23 0759  Weight: 79.8 kg 76.8 kg 77.4 kg     Data Reviewed:   CBC: Recent Labs  Lab 01/01/23 0955 01/02/23 0058 01/03/23 0504 01/04/23 0513 01/05/23 0523  WBC 10.6* 13.1* 13.1* 6.6 11.5*  HGB 10.9* 10.3* 10.9* 10.9* 10.6*  HCT 33.8* 31.7* 33.9* 33.3* 32.4*  MCV 89.9 88.5 89.7 87.2 88.8  PLT 247 247 231 248 [id number]   Basic Metabolic Panel: Recent Labs  Lab 01/01/23 0955 01/02/23 0058 01/03/23 0504 01/04/23 0513 01/05/23 0523  NA 131* 128* 131* 134* 137  K 4.7 4.4 4.0 3.9 3.9  CL 98 95* 94* 92* 96*  CO2 25 25 27 30 28   GLUCOSE 96 94 91 133* 170*  BUN 36* 46* 46* 43* 51*  CREATININE 0.98 1.20* 1.19* 1.25* 1.22*  CALCIUM 8.2* 8.3* 8.3* 8.4* 8.4*  MG  --   --   --   --  2.0   GFR: Estimated Creatinine Clearance: 28.9 mL/min (A) (by C-G formula based on SCr of 1.22 mg/dL (H)). Liver Function Tests: Recent Labs  Lab 01/01/23 0955 01/02/23 0058  AST 16 14*  ALT 11 13  ALKPHOS 48 47  BILITOT 0.7 0.7  PROT 5.6* 5.6*  ALBUMIN 3.1* 3.4*   No results for input(s): "LIPASE", "AMYLASE" in the last 168 hours. No results for input(s): "AMMONIA" in the last 168 hours. Coagulation Profile: Recent Labs  Lab 01/02/23 0058  INR 1.2   Cardiac Enzymes: No results for input(s): "CKTOTAL", "CKMB", "CKMBINDEX", "TROPONINI" in the last 168 hours. BNP (last 3 results) No results for input(s): "PROBNP" in the last 8760 hours. HbA1C: No results for input(s): "HGBA1C" in the last 72 hours. CBG: Recent Labs  Lab 01/04/23 0720 01/04/23 1113 01/04/23 1654 01/04/23 2008 01/05/23 0720  GLUCAP 143* 180* 185* 176* 119*   Lipid Profile: Recent Labs    01/03/23 0504  CHOL 86  HDL 32*  LDLCALC 44  TRIG 52  CHOLHDL 2.7   Thyroid Function Tests: No results for input(s): "TSH", "T4TOTAL", "FREET4", "T3FREE", "THYROIDAB" in the last 72 hours.  Anemia Panel: No results for input(s): "VITAMINB12", "FOLATE", "FERRITIN", "TIBC", "IRON", "RETICCTPCT" in the last 72  hours. Sepsis Labs: Recent Labs  Lab 01/02/23 0754 01/02/23 1107  LATICACIDVEN 1.5 1.9    Recent Results (from the past 240 hour(s))  Resp panel by RT-PCR (RSV, Flu A&B, Covid) Anterior Nasal Swab     Status: None   Collection Time: 01/01/23  9:23 AM   Specimen: Anterior Nasal Swab  Result Value Ref Range Status   SARS Coronavirus 2 by RT PCR NEGATIVE NEGATIVE Final    Comment: (NOTE) SARS-CoV-2 target nucleic acids are NOT DETECTED.  The SARS-CoV-2 RNA is generally detectable in upper respiratory specimens during the acute phase of infection. The lowest concentration of SARS-CoV-2 viral copies this assay can detect is 138 copies/mL. A negative result does not preclude SARS-Cov-2 infection and should not be used as the sole basis for treatment or other patient management decisions. A negative result may occur with  improper specimen collection/handling,  submission of specimen other than nasopharyngeal swab, presence of viral mutation(s) within the areas targeted by this assay, and inadequate number of viral copies(<138 copies/mL). A negative result must be combined with clinical observations, patient history, and epidemiological information. The expected result is Negative.  Fact Sheet for Patients:  EntrepreneurPulse.com.au  Fact Sheet for Healthcare Providers:  IncredibleEmployment.be  This test is no t yet approved or cleared by the [place] FDA and  has been authorized for detection and/or diagnosis of SARS-CoV-2 by FDA under an Emergency Use Authorization (EUA). This EUA will remain  in effect (meaning this test can be used) for the duration of the COVID-19 declaration under Section 564(b)(1) of the Act, 21 U.S.C.section 360bbb-3(b)(1), unless the authorization is terminated  or revoked sooner.       Influenza A by PCR NEGATIVE NEGATIVE Final   Influenza B by PCR NEGATIVE NEGATIVE Final    Comment: (NOTE) The Xpert Xpress  SARS-CoV-2/FLU/RSV plus assay is intended as an aid in the diagnosis of influenza from Nasopharyngeal swab specimens and should not be used as a sole basis for treatment. Nasal washings and aspirates are unacceptable for Xpert Xpress SARS-CoV-2/FLU/RSV testing.  Fact Sheet for Patients: EntrepreneurPulse.com.au  Fact Sheet for Healthcare Providers: IncredibleEmployment.be  This test is not yet approved or cleared by the [place] FDA and has been authorized for detection and/or diagnosis of SARS-CoV-2 by FDA under an Emergency Use Authorization (EUA). This EUA will remain in effect (meaning this test can be used) for the duration of the COVID-19 declaration under Section 564(b)(1) of the Act, 21 U.S.C. section 360bbb-3(b)(1), unless the authorization is terminated or revoked.     Resp Syncytial Virus by PCR NEGATIVE NEGATIVE Final    Comment: (NOTE) Fact Sheet for Patients: EntrepreneurPulse.com.au  Fact Sheet for Healthcare Providers: IncredibleEmployment.be  This test is not yet approved or cleared by the [place] FDA and has been authorized for detection and/or diagnosis of SARS-CoV-2 by FDA under an Emergency Use Authorization (EUA). This EUA will remain in effect (meaning this test can be used) for the duration of the COVID-19 declaration under Section 564(b)(1) of the Act, 21 U.S.C. section 360bbb-3(b)(1), unless the authorization is terminated or revoked.  Performed at [facility], [place] [address] [place], [place] [address]          Radiology Studies: No results found.      Scheduled Meds:  cephALEXin  500 mg Oral BID   furosemide  40 mg Oral Daily   insulin aspart  0-9 Units Subcutaneous TID WC   predniSONE  40 mg Oral Q breakfast   rosuvastatin  5 mg Oral Q M,W,F   Continuous Infusions:     LOS: 4 days   Time spent= 35 mins    Jarrid Lienhard Arsenio Loader, MD [facility]  If 7PM-7AM, please contact night-coverage  01/05/2023, 11:51 AM

## 2023-01-06 DIAGNOSIS — I5031 Acute diastolic (congestive) heart failure: Secondary | ICD-10-CM | POA: Diagnosis not present

## 2023-01-06 LAB — BASIC METABOLIC PANEL
Anion gap: 13 (ref 5–15)
BUN: 54 mg/dL — ABNORMAL HIGH (ref 8–23)
CO2: 27 mmol/L (ref 22–32)
Calcium: 8.2 mg/dL — ABNORMAL LOW (ref 8.9–10.3)
Chloride: 94 mmol/L — ABNORMAL LOW (ref 98–111)
Creatinine, Ser: 1.05 mg/dL — ABNORMAL HIGH (ref 0.44–1.00)
GFR, Estimated: 50 mL/min — ABNORMAL LOW (ref >60)
Glucose, Bld: 146 mg/dL — ABNORMAL HIGH (ref 70–99)
Potassium: 4.1 mmol/L (ref 3.5–5.1)
Sodium: 134 mmol/L — ABNORMAL LOW (ref 135–145)

## 2023-01-06 LAB — CBC
HCT: 27.3 % — ABNORMAL LOW (ref 36.0–46.0)
Hemoglobin: 8.8 g/dL — ABNORMAL LOW (ref 12.0–15.0)
MCH: 29.4 pg (ref 26.0–34.0)
MCHC: 32.2 g/dL (ref 30.0–36.0)
MCV: 91.3 fL (ref 80.0–100.0)
Platelets: 172 10*3/uL (ref 150–400)
RBC: 2.99 MIL/uL — ABNORMAL LOW (ref 3.87–5.11)
RDW: 16.1 % — ABNORMAL HIGH (ref 11.5–15.5)
WBC: 10.9 10*3/uL — ABNORMAL HIGH (ref 4.0–10.5)
nRBC: 0 % (ref 0.0–0.2)

## 2023-01-06 LAB — GLUCOSE, CAPILLARY
Glucose-Capillary: 110 mg/dL — ABNORMAL HIGH (ref 70–99)
Glucose-Capillary: 140 mg/dL — ABNORMAL HIGH (ref 70–99)
Glucose-Capillary: 165 mg/dL — ABNORMAL HIGH (ref 70–99)
Glucose-Capillary: 215 mg/dL — ABNORMAL HIGH (ref 70–99)

## 2023-01-06 LAB — MAGNESIUM: Magnesium: 2 mg/dL (ref 1.7–2.4)

## 2023-01-06 MED ORDER — INSULIN ASPART 100 UNIT/ML IJ SOLN
2.0000 [IU] | Freq: Once | INTRAMUSCULAR | Status: AC
  Administered 2023-01-06: 2 [IU] via SUBCUTANEOUS

## 2023-01-06 MED ORDER — TRAMADOL HCL 50 MG PO TABS
50.0000 mg | ORAL_TABLET | Freq: Once | ORAL | Status: AC
  Administered 2023-01-06: 50 mg via ORAL
  Filled 2023-01-06: qty 1

## 2023-01-06 NOTE — Progress Notes (Signed)
Assumed care of patient at 1500 from Celene Kras, RN. Agree with previously documented assessment. Will continue plan of care.

## 2023-01-06 NOTE — Progress Notes (Signed)
Physical Therapy Treatment Patient Details Name: MCKINZY MINELLI MRN: [id number] DOB: 12-22-1928 Today's Date: 01/06/2023   History of Present Illness Patient is a 89 year old female who presented with 1 week history of progressive weakness and decreased mobility. Patient was admitted with A fib, and CHF exacerbation. PMH: Pre-diabetes, arthritis, Hypertensive heart disease, Grade II diastolic dysfunction in setting of moderate asymmetric left ventricular hypertrophy of the basal-septal segment with EF of 65% noted on echo 2021,    PT Comments    Pt assisted with OOB to recliner today.  RN and tech into room to assist with transfer and pericare.  Pt remains appropriate for SNF recommendation upon d/c.    Recommendations for follow up therapy are one component of a multi-disciplinary discharge planning process, led by the attending physician.  Recommendations may be updated based on patient status, additional functional criteria and insurance authorization.  Follow Up Recommendations  Skilled nursing-short term rehab (<3 hours/day) Can patient physically be transported by private vehicle: No   Assistance Recommended at Discharge Frequent or constant Supervision/Assistance  Patient can return home with the following A lot of help with walking and/or transfers;A lot of help with bathing/dressing/bathroom;Assistance with cooking/housework;Help with stairs or ramp for entrance   Equipment Recommendations  None recommended by PT    Recommendations for Other Services       Precautions / Restrictions Precautions Precautions: Fall Precaution Comments: right knee fused     Mobility  Bed Mobility Overal bed mobility: Needs Assistance Bed Mobility: Supine to Sit     Supine to sit: Total assist, HOB elevated, +2 for physical assistance     General bed mobility comments: provided cues for self assist however pt still requiring physical assist due to weakness and pain;  utilized bed pads  for positioning to EOB    Transfers Overall transfer level: Needs assistance Equipment used: Rolling walker (2 wheels) Transfers: Sit to/from Stand, Bed to chair/wheelchair/BSC Sit to Stand: Mod assist, +2 safety/equipment, From elevated surface   Step pivot transfers: Min assist, +2 safety/equipment       General transfer comment: assist to rise and stabilize from elevated bed; once pt standing able to remain upright min/guard, min assist for stability with taking a few steps over to recliner (pt's bed saturated with urine with pt slipping at EOB so brought pt back onto bed with bed pad and stood from elevated surface so pt's knees could maintain extension (pt does not perform hip flexion well for transfers); requires assist for landing safely in recliner due to lack of hip flexion and pt demonstrating full body extension with descent    Ambulation/Gait                   Stairs             Wheelchair Mobility    Modified Rankin (Stroke Patients Only)       Balance                                            Cognition Arousal/Alertness: Awake/alert Behavior During Therapy: WFL for tasks assessed/performed Overall Cognitive Status: History of cognitive impairments - at baseline  Exercises      General Comments        Pertinent Vitals/Pain Pain Assessment Pain Assessment: Faces Faces Pain Scale: Hurts little more Pain Location: both legs to touch and move the legs Pain Descriptors / Indicators: Discomfort, Guarding, Grimacing, Tender Pain Intervention(s): Repositioned, Monitored during session    Home Living                          Prior Function            PT Goals (current goals can now be found in the care plan section) Progress towards PT goals: Progressing toward goals    Frequency    Min 2X/week      PT Plan Current plan remains appropriate     Co-evaluation              AM-PAC PT "6 Clicks" Mobility   Outcome Measure  Help needed turning from your back to your side while in a flat bed without using bedrails?: Total Help needed moving from lying on your back to sitting on the side of a flat bed without using bedrails?: Total Help needed moving to and from a bed to a chair (including a wheelchair)?: A Lot Help needed standing up from a chair using your arms (e.g., wheelchair or bedside chair)?: A Lot Help needed to walk in hospital room?: A Lot Help needed climbing 3-5 steps with a railing? : Total 6 Click Score: 9    End of Session Equipment Utilized During Treatment: Gait belt;Oxygen Activity Tolerance: Patient limited by fatigue Patient left: in chair;with call bell/phone within reach;with chair alarm set;with nursing/sitter in room Nurse Communication: Mobility status PT Visit Diagnosis: Muscle weakness (generalized) (M62.81);Difficulty in walking, not elsewhere classified (R26.2)     Time: [id number] PT Time Calculation (min) (ACUTE ONLY): 17 min  Charges:  $Therapeutic Activity: 8-22 mins                     Jannette Spanner PT, DPT Physical Therapist Acute Rehabilitation Services Preferred contact method: Secure Chat Weekend Pager Only: [phone] Office: [place] 01/06/2023, 4:32 PM

## 2023-01-06 NOTE — Progress Notes (Signed)
Rounding Note    Patient Name: Dawn Lawson Date of Encounter: 01/06/2023  [place] Cardiologist: Freada Bergeron, MD   Subjective  Slept ok. Her inner thigh is painful on the L. Son at the bedside with his wife  Inpatient Medications    Scheduled Meds:  cephALEXin  500 mg Oral BID   furosemide  40 mg Oral Daily   insulin aspart  0-9 Units Subcutaneous TID WC   predniSONE  40 mg Oral Q breakfast   rosuvastatin  5 mg Oral Q M,W,F   Continuous Infusions:  PRN Meds: acetaminophen **OR** acetaminophen, hydrALAZINE, ipratropium-albuterol, metoprolol tartrate, ondansetron **OR** ondansetron (ZOFRAN) IV, mouth rinse, senna-docusate, traZODone   Vital Signs    Vitals:   01/05/23 2056 01/06/23 0631 01/06/23 0749 01/06/23 0749  BP: (!) 136/56 (!) 141/53  (!) 125/53  Pulse: 80 71  (!) 105  Resp: 19 14 17    Temp: 97.7 F (36.5 C) 97.7 F (36.5 C)    TempSrc: Oral Oral Oral Oral  SpO2: 100% 100%    Weight:  78.1 kg    Height:        Intake/Output Summary (Last 24 hours) at 01/06/2023 0846 Last data filed at 01/06/2023 0100 Gross per 24 hour  Intake 1080 ml  Output 800 ml  Net 280 ml      01/06/2023    6:31 AM 01/05/2023    7:59 AM 01/04/2023    5:29 AM  Last 3 Weights  Weight (lbs) 172 lb 1.6 oz 170 lb 9.6 oz 169 lb 4.8 oz  Weight (kg) 78.064 kg 77.384 kg 76.794 kg      Telemetry    Atrial fibrillation, HR in the 60s-70s - Personally Reviewed  ECG    No new tracings since 3/11 - Personally Reviewed  Physical Exam   GEN: Elderly female, sitting upright in the bed. No acute distress  Neck: No JVD Cardiac: Irregular rate and rhythm, radial pulses 2+ bilaterally  Respiratory: Clear to auscultation bilaterally. Normal WOB on 2 L supplemental oxygen via [place]  GI: Soft, nontender, non-distended  MS: No edema in BLE; No deformity. Bilateral lower extremities are tender to palpation  Neuro:  Nonfocal  Psych: Normal affect   Labs    High  Sensitivity Troponin:   Recent Labs  Lab 01/01/23 0955 01/01/23 1231  TROPONINIHS 554* 506*     Chemistry Recent Labs  Lab 01/01/23 0955 01/02/23 0058 01/03/23 0504 01/04/23 0513 01/05/23 0523 01/06/23 0537  NA 131* 128*   < > 134* 137 134*  K 4.7 4.4   < > 3.9 3.9 4.1  CL 98 95*   < > 92* 96* 94*  CO2 25 25   < > 30 28 27   GLUCOSE 96 94   < > 133* 170* 146*  BUN 36* 46*   < > 43* 51* 54*  CREATININE 0.98 1.20*   < > 1.25* 1.22* 1.05*  CALCIUM 8.2* 8.3*   < > 8.4* 8.4* 8.2*  MG  --   --   --   --  2.0 2.0  PROT 5.6* 5.6*  --   --   --   --   ALBUMIN 3.1* 3.4*  --   --   --   --   AST 16 14*  --   --   --   --   ALT 11 13  --   --   --   --   ALKPHOS 48 47  --   --   --   --  BILITOT 0.7 0.7  --   --   --   --   GFRNONAA 54* 43*   < > 41* 42* 50*  ANIONGAP 8 8   < > 12 13 13    < > = values in this interval not displayed.    Lipids  Recent Labs  Lab 01/03/23 0504  CHOL 86  TRIG 52  HDL 32*  LDLCALC 44  CHOLHDL 2.7    Hematology Recent Labs  Lab 01/04/23 0513 01/05/23 0523 01/06/23 0537  WBC 6.6 11.5* 10.9*  RBC 3.82* 3.65* 2.99*  HGB 10.9* 10.6* 8.8*  HCT 33.3* 32.4* 27.3*  MCV 87.2 88.8 91.3  MCH 28.5 29.0 29.4  MCHC 32.7 32.7 32.2  RDW 16.0* 16.2* 16.1*  PLT 248 247 172   Thyroid  Recent Labs  Lab 01/01/23 1645  TSH 1.448    BNP Recent Labs  Lab 01/01/23 0955 01/03/23 0504  BNP 1,209.2* 518.5*    DDimer No results for input(s): "DDIMER" in the last 168 hours.   Radiology    CT FEMUR LEFT WO CONTRAST  Result Date: 01/05/2023 CLINICAL DATA:  Thigh soft tissue mass, deep. Evaluate for hematoma. EXAM: CT OF THE LOWER LEFT EXTREMITY WITHOUT CONTRAST TECHNIQUE: Multidetector CT imaging of the lower left extremity was performed according to the standard protocol. RADIATION DOSE REDUCTION: This exam was performed according to the departmental dose-optimization program which includes automated exposure control, adjustment of the mA and/or kV  according to patient size and/or use of iterative reconstruction technique. COMPARISON:  None Available. FINDINGS: Bones/Joint/Cartilage There is diffuse decreased bone mineralization. Markedly severe medial greater than lateral knee compartment joint space narrowing bone-on-bone contact and large peripheral osteophytes. There is also severe patellofemoral joint space narrowing and peripheral osteophytosis. Moderate to severe pubic symphysis joint space narrowing and peripheral osteophytosis. Moderate to severe from acetabular joint space narrowing with moderate peripheral acetabular and inferior humeral head-neck junction degenerative osteophytes. Ligaments Suboptimally assessed by CT. Muscles and Tendons Normal density and size of the regional left thigh musculature. Soft tissues There is moderate subcutaneous fat edema and swelling throughout the visualized lower left anterior abdominal wall and the left thigh diffusely, greatest within the lateral greater than medial aspect of the proximal mid left thigh. There are multiple lobular structures of internal density ranging from approximately 31-35 Hounsfield units which may represent a hematoma versus soft tissue masses. The dominant coalescing collection measures up to approximately 4.4 x 6.4 x 12.5 cm (transverse by AP by craniocaudal, as measured on axial series 6, image 229 and coronal series 9, image 93). Given the lobular morphology and smaller, separate minimally contacting oval masses at the superior aspect, a hematoma is favored over contiguous soft tissue masses. Partially visualized markedly high-grade sigmoid diverticulosis without definite inflammatory change. Surgical sutures overlie the midline anterior superior pelvis. There are high-grade atherosclerotic calcifications. IMPRESSION: 1. There is a 4.4 x 6.4 x 12.5 cm lobular soft tissue density mass within the lateral greater than medial aspect of the proximal left thigh. Given the lobular morphology  and the small, only minimally contacting components superiorly, this is favored to represent a hematoma, however contiguous soft tissue masses can have a similar appearance. Recommend close clinical follow-up to ensure this suspected hematoma resolves clinically. 2. There is moderate subcutaneous fat edema and swelling throughout the visualized lower left anterior abdominal wall and the left thigh. 3. Partially visualized high-grade sigmoid diverticulosis without definite inflammatory change. 4. Severe tricompartmental osteoarthritis of the left knee. 5. Moderate  to severe left hip osteoarthritis. Electronically Signed   By: Yvonne Kendall M.D.   On: 01/05/2023 12:54    Cardiac Studies   Echocardiogram 01/01/2023  1. Left ventricular ejection fraction, by estimation, is 60 to 65%. The  left ventricle has normal function. The left ventricle has no regional  wall motion abnormalities. There is mild concentric left ventricular  hypertrophy. Left ventricular diastolic  function could not be evaluated.   2. Right ventricular systolic function is mildly reduced. The right  ventricular size is moderately enlarged. There is moderately elevated  pulmonary artery systolic pressure. The estimated right ventricular  systolic pressure is 0000000 mmHg.   3. Left atrial size was severely dilated.   4. Right atrial size was severely dilated.   5. The mitral valve is normal in structure. Mild to moderate mitral valve  regurgitation. No evidence of mitral stenosis.   6. Tricuspid valve regurgitation is moderate.   7. The aortic valve is tricuspid. There is mild calcification of the  aortic valve. Aortic valve regurgitation is mild to moderate. Aortic valve  sclerosis/calcification is present, without any evidence of aortic  stenosis. Aortic regurgitation PHT  measures 426 msec.   8. Aortic dilatation noted. There is borderline dilatation of the  ascending aorta, measuring 37 mm.   9. The inferior vena cava is  dilated in size with <50% respiratory  variability, suggesting right atrial pressure of 15 mmHg.   Patient Profile     89 y.o. female  with prediabetes, hypertension who was admitted on 01/01/2023 for acute hypoxic respiratory failure secondary to acute  diastolic heart failure, now on oral diuretic; now with ongoing hematoma  Assessment & Plan    Acute on chronic diastolic heart failure RV dysfunction Acute hypoxic respiratory failure - Patient was brought into the ED on 3/11 after the family noticed that she had been having increased weakness and decreased mobility for the past several weeks now.  Weakness had worsened significantly over the past week.  Also complained of shortness of breath - Initial BNP on 3/11 elevated to 1209.  Chest x-ray 3/11 showed CHF with small left pleural effusion. BNP 3/13 down to 518.5 - Echocardiogram this admission showed EF 60-65% with no regional wall motion abnormalities, mildly reduced RV systolic function - Patient has been diuresing on IV Lasix.  Output 2.05 L urine 3/15 and is currently net -4.62 L since admission. Weight down 7 lbs. Lungs clear on exam  - continue PO lasix 40 mg daily  - No SGLT2i given patient's age. Can consider starting spironolactone prior to DC if renal function improves    Elevated troponin Demand ischemia - High sensitive troponin 554, 506 on admission -Patient is without chest pain. Suspect this is demand ischemia in the setting of decompensated heart failure. -Continue 5 mg Crestor on MWF    Paroxysmal Atrial fibrillation - Appears to be a new diagnosis this admission ---> holding eliquis with inner thigh hematoma - Risk benefit of stroke/bleeding was discussed by Dr. Marlou Porch with the family: "Eliquis has been stopped appropriately.  We will avoid anticoagulation given her easy bleeding properties.  I would not even be opposed to her staying off of low-dose aspirin as well.  Did discuss with the family that without  anticoagulation, there is an increased risk for stroke.  However risk of bleeding outweighs the risk at this point. " - HR well controlled, no need for AV nodal blocking medications. - unable to restore sinus rhythm with chemical or  electrical conversion given no AC   For questions or updates, please contact [place] Please consult [email or web address] for contact info under     Signed, Janina Mayo, MD  01/06/2023, 8:46 AM

## 2023-01-06 NOTE — Progress Notes (Signed)
PROGRESS NOTE    Dawn Lawson  [id number] DOB: 1929-01-21 DOA: 01/01/2023 PCP: [name], NP   Brief Narrative:    89 y.o. female with medical history significant of Pre-diabetes, arthritis, Hypertensive heart disease, Grade II diastolic dysfunction in setting of moderate asymmetric left ventricular hypertrophy of the basal-septal segment with EF of 65% noted on echo 2021, with in history of chronic b/l lower extremity swelling and interim history of several months of  progressive weakness with sharp decrease of mobility over last 1 week.  Patient lives alone with help from family as needed. In the ER, found to have a fib and suspected CHF exacerbation.  Patient is getting diuresed with direction from cardiology.  PT recommended SNF, bed to be available on Monday.  Assessment & Plan:  Principal Problem:   CHF (congestive heart failure) (HCC) Active Problems:   Acute diastolic heart failure (HCC)    Acute on chronic diastolic heart failure exacerbation Acute respiratory distress secondary to CHF exacerbation -Echocardiogram shows ejection fraction 65% with moderately enlarged RV and elevated PASP.  Continue diuretics, monitor urine output.  Replete electrolytes as needed, p.o. Lasix 40 mg daily.  Left thigh hematoma with ecchymosis - CT thigh consistent with left inner thigh hematoma.  Anticoagulation discontinued.  Warm compresses, monitor. Hb 10.6 > 8.8  Elevated troponin, NSTEMI - Continue Crestor Monday Wednesday and Friday.  Will discontinue antiplatelets and anticoagulation  Abnormal breath sounds - Likely from fluid overload but some rhonchi as well.  Does have previous history of smoking but no formal diagnosis of COPD.  Steroids and bronchodilators.  Total 5-day course.   New Onset Afib - Avoid anticoagulation given high risk of fall.  Would opt for rate control strategy and avoid long-term anticoagulation.  Family in agreement.  Family understands she will be at  high risk of CVA.  Elevated BUN - Secondary to steroid use.  Creatinine stable.    Pre-diabetes -A1c 6.22 Sep 2022.  Closely monitor   Hypertension Stable blood pressure   Debility  PT/OT, uses walker at home   Obesity Estimated body mass index is 32.45 kg/m as calculated from the following:   Height as of this encounter: 5\' 2"  (1.575 m).   Weight as of this encounter: 80.5 kg.    lipedema with h/o tibia osteo -Chronic knee on Keflex at home  PT/OT-SNF  DVT prophylaxis: Start subcu DVT prophylaxis of CTF left thigh is negative Code Status: DNR Family Communication: Son at bedside  Status is: Inpatient Per [facility] SNF bed available on Monday    Subjective: Left thigh pain getting better.  Somewhat drowsy this morning  Examination: Constitutional: Not in acute distress Respiratory: Clear to auscultation bilaterally Cardiovascular: Normal sinus rhythm, no rubs Abdomen: Nontender nondistended good bowel sounds Musculoskeletal: No edema noted Skin: Left thigh erythema and hematoma Neurologic: CN 2-12 grossly intact.  And nonfocal Psychiatric: Normal judgment and insight. Alert and oriented x 3. Normal mood.   Objective: Vitals:   01/05/23 0759 01/05/23 1352 01/05/23 2056 01/06/23 0631  BP:  (!) 123/50 (!) 136/56 (!) 141/53  Pulse:  77 80 71  Resp:  14 19 14   Temp:  98.1 F (36.7 C) 97.7 F (36.5 C) 97.7 F (36.5 C)  TempSrc:  Oral Oral Oral  SpO2:  100% 100% 100%  Weight: 77.4 kg   78.1 kg  Height:        Intake/Output Summary (Last 24 hours) at 01/06/2023 0741 Last data filed at 01/06/2023 0100 Gross  per 24 hour  Intake 1080 ml  Output 1000 ml  Net 80 ml   Filed Weights   01/04/23 0529 01/05/23 0759 01/06/23 0631  Weight: 76.8 kg 77.4 kg 78.1 kg     Data Reviewed:   CBC: Recent Labs  Lab 01/02/23 0058 01/03/23 0504 01/04/23 0513 01/05/23 0523 01/06/23 0537  WBC 13.1* 13.1* 6.6 11.5* 10.9*  HGB 10.3* 10.9* 10.9* 10.6* 8.8*  HCT 31.7* 33.9*  33.3* 32.4* 27.3*  MCV 88.5 89.7 87.2 88.8 91.3  PLT 247 231 248 247 [id number]   Basic Metabolic Panel: Recent Labs  Lab 01/02/23 0058 01/03/23 0504 01/04/23 0513 01/05/23 0523 01/06/23 0537  NA 128* 131* 134* 137 134*  K 4.4 4.0 3.9 3.9 4.1  CL 95* 94* 92* 96* 94*  CO2 25 27 30 28 27   GLUCOSE 94 91 133* 170* 146*  BUN 46* 46* 43* 51* 54*  CREATININE 1.20* 1.19* 1.25* 1.22* 1.05*  CALCIUM 8.3* 8.3* 8.4* 8.4* 8.2*  MG  --   --   --  2.0 2.0   GFR: Estimated Creatinine Clearance: 33.8 mL/min (A) (by C-G formula based on SCr of 1.05 mg/dL (H)). Liver Function Tests: Recent Labs  Lab 01/01/23 0955 01/02/23 0058  AST 16 14*  ALT 11 13  ALKPHOS 48 47  BILITOT 0.7 0.7  PROT 5.6* 5.6*  ALBUMIN 3.1* 3.4*   No results for input(s): "LIPASE", "AMYLASE" in the last 168 hours. No results for input(s): "AMMONIA" in the last 168 hours. Coagulation Profile: Recent Labs  Lab 01/02/23 0058  INR 1.2   Cardiac Enzymes: No results for input(s): "CKTOTAL", "CKMB", "CKMBINDEX", "TROPONINI" in the last 168 hours. BNP (last 3 results) No results for input(s): "PROBNP" in the last 8760 hours. HbA1C: No results for input(s): "HGBA1C" in the last 72 hours. CBG: Recent Labs  Lab 01/04/23 2008 01/05/23 0720 01/05/23 1233 01/05/23 1646 01/05/23 2112  GLUCAP 176* 119* 151* 212* 177*   Lipid Profile: No results for input(s): "CHOL", "HDL", "LDLCALC", "TRIG", "CHOLHDL", "LDLDIRECT" in the last 72 hours.  Thyroid Function Tests: No results for input(s): "TSH", "T4TOTAL", "FREET4", "T3FREE", "THYROIDAB" in the last 72 hours.  Anemia Panel: No results for input(s): "VITAMINB12", "FOLATE", "FERRITIN", "TIBC", "IRON", "RETICCTPCT" in the last 72 hours. Sepsis Labs: Recent Labs  Lab 01/02/23 0754 01/02/23 1107  LATICACIDVEN 1.5 1.9    Recent Results (from the past 240 hour(s))  Resp panel by RT-PCR (RSV, Flu A&B, Covid) Anterior Nasal Swab     Status: None   Collection Time: 01/01/23   9:23 AM   Specimen: Anterior Nasal Swab  Result Value Ref Range Status   SARS Coronavirus 2 by RT PCR NEGATIVE NEGATIVE Final    Comment: (NOTE) SARS-CoV-2 target nucleic acids are NOT DETECTED.  The SARS-CoV-2 RNA is generally detectable in upper respiratory specimens during the acute phase of infection. The lowest concentration of SARS-CoV-2 viral copies this assay can detect is 138 copies/mL. A negative result does not preclude SARS-Cov-2 infection and should not be used as the sole basis for treatment or other patient management decisions. A negative result may occur with  improper specimen collection/handling, submission of specimen other than nasopharyngeal swab, presence of viral mutation(s) within the areas targeted by this assay, and inadequate number of viral copies(<138 copies/mL). A negative result must be combined with clinical observations, patient history, and epidemiological information. The expected result is Negative.  Fact Sheet for Patients:  EntrepreneurPulse.com.au  Fact Sheet for Healthcare Providers:  IncredibleEmployment.be  This test is no t yet approved or cleared by the [place] and  has been authorized for detection and/or diagnosis of SARS-CoV-2 by FDA under an Emergency Use Authorization (EUA). This EUA will remain  in effect (meaning this test can be used) for the duration of the COVID-19 declaration under Section 564(b)(1) of the Act, 21 U.S.C.section 360bbb-3(b)(1), unless the authorization is terminated  or revoked sooner.       Influenza A by PCR NEGATIVE NEGATIVE Final   Influenza B by PCR NEGATIVE NEGATIVE Final    Comment: (NOTE) The Xpert Xpress SARS-CoV-2/FLU/RSV plus assay is intended as an aid in the diagnosis of influenza from Nasopharyngeal swab specimens and should not be used as a sole basis for treatment. Nasal washings and aspirates are unacceptable for Xpert Xpress  SARS-CoV-2/FLU/RSV testing.  Fact Sheet for Patients: EntrepreneurPulse.com.au  Fact Sheet for Healthcare Providers: IncredibleEmployment.be  This test is not yet approved or cleared by the [place] FDA and has been authorized for detection and/or diagnosis of SARS-CoV-2 by FDA under an Emergency Use Authorization (EUA). This EUA will remain in effect (meaning this test can be used) for the duration of the COVID-19 declaration under Section 564(b)(1) of the Act, 21 U.S.C. section 360bbb-3(b)(1), unless the authorization is terminated or revoked.     Resp Syncytial Virus by PCR NEGATIVE NEGATIVE Final    Comment: (NOTE) Fact Sheet for Patients: EntrepreneurPulse.com.au  Fact Sheet for Healthcare Providers: IncredibleEmployment.be  This test is not yet approved or cleared by the [place] FDA and has been authorized for detection and/or diagnosis of SARS-CoV-2 by FDA under an Emergency Use Authorization (EUA). This EUA will remain in effect (meaning this test can be used) for the duration of the COVID-19 declaration under Section 564(b)(1) of the Act, 21 U.S.C. section 360bbb-3(b)(1), unless the authorization is terminated or revoked.  Performed at [facility], [place] [address] [place], [place] [address]          Radiology Studies: CT FEMUR LEFT WO CONTRAST  Result Date: 01/05/2023 CLINICAL DATA:  Thigh soft tissue mass, deep. Evaluate for hematoma. EXAM: CT OF THE LOWER LEFT EXTREMITY WITHOUT CONTRAST TECHNIQUE: Multidetector CT imaging of the lower left extremity was performed according to the standard protocol. RADIATION DOSE REDUCTION: This exam was performed according to the departmental dose-optimization program which includes automated exposure control, adjustment of the mA and/or kV according to patient size and/or use of iterative reconstruction technique. COMPARISON:   None Available. FINDINGS: Bones/Joint/Cartilage There is diffuse decreased bone mineralization. Markedly severe medial greater than lateral knee compartment joint space narrowing bone-on-bone contact and large peripheral osteophytes. There is also severe patellofemoral joint space narrowing and peripheral osteophytosis. Moderate to severe pubic symphysis joint space narrowing and peripheral osteophytosis. Moderate to severe from acetabular joint space narrowing with moderate peripheral acetabular and inferior humeral head-neck junction degenerative osteophytes. Ligaments Suboptimally assessed by CT. Muscles and Tendons Normal density and size of the regional left thigh musculature. Soft tissues There is moderate subcutaneous fat edema and swelling throughout the visualized lower left anterior abdominal wall and the left thigh diffusely, greatest within the lateral greater than medial aspect of the proximal mid left thigh. There are multiple lobular structures of internal density ranging from approximately 31-35 Hounsfield units which may represent a hematoma versus soft tissue masses. The dominant coalescing collection measures up to approximately 4.4 x 6.4 x 12.5 cm (transverse by AP by craniocaudal, as measured on axial series 6, image 229 and  coronal series 9, image 93). Given the lobular morphology and smaller, separate minimally contacting oval masses at the superior aspect, a hematoma is favored over contiguous soft tissue masses. Partially visualized markedly high-grade sigmoid diverticulosis without definite inflammatory change. Surgical sutures overlie the midline anterior superior pelvis. There are high-grade atherosclerotic calcifications. IMPRESSION: 1. There is a 4.4 x 6.4 x 12.5 cm lobular soft tissue density mass within the lateral greater than medial aspect of the proximal left thigh. Given the lobular morphology and the small, only minimally contacting components superiorly, this is favored to  represent a hematoma, however contiguous soft tissue masses can have a similar appearance. Recommend close clinical follow-up to ensure this suspected hematoma resolves clinically. 2. There is moderate subcutaneous fat edema and swelling throughout the visualized lower left anterior abdominal wall and the left thigh. 3. Partially visualized high-grade sigmoid diverticulosis without definite inflammatory change. 4. Severe tricompartmental osteoarthritis of the left knee. 5. Moderate to severe left hip osteoarthritis. Electronically Signed   By: Yvonne Kendall M.D.   On: 01/05/2023 12:54        Scheduled Meds:  cephALEXin  500 mg Oral BID   furosemide  40 mg Oral Daily   insulin aspart  0-9 Units Subcutaneous TID WC   predniSONE  40 mg Oral Q breakfast   rosuvastatin  5 mg Oral Q M,W,F   Continuous Infusions:     LOS: 5 days   Time spent= 35 mins    Favour Aleshire Arsenio Loader, MD [facility]  If 7PM-7AM, please contact night-coverage  01/06/2023, 7:41 AM

## 2023-01-07 DIAGNOSIS — I5031 Acute diastolic (congestive) heart failure: Secondary | ICD-10-CM | POA: Diagnosis not present

## 2023-01-07 LAB — BASIC METABOLIC PANEL
Anion gap: 11 (ref 5–15)
BUN: 54 mg/dL — ABNORMAL HIGH (ref 8–23)
CO2: 29 mmol/L (ref 22–32)
Calcium: 8.1 mg/dL — ABNORMAL LOW (ref 8.9–10.3)
Chloride: 93 mmol/L — ABNORMAL LOW (ref 98–111)
Creatinine, Ser: 1.12 mg/dL — ABNORMAL HIGH (ref 0.44–1.00)
GFR, Estimated: 46 mL/min — ABNORMAL LOW (ref >60)
Glucose, Bld: 132 mg/dL — ABNORMAL HIGH (ref 70–99)
Potassium: 3.8 mmol/L (ref 3.5–5.1)
Sodium: 133 mmol/L — ABNORMAL LOW (ref 135–145)

## 2023-01-07 LAB — CBC
HCT: 28.7 % — ABNORMAL LOW (ref 36.0–46.0)
Hemoglobin: 9.3 g/dL — ABNORMAL LOW (ref 12.0–15.0)
MCH: 29.1 pg (ref 26.0–34.0)
MCHC: 32.4 g/dL (ref 30.0–36.0)
MCV: 89.7 fL (ref 80.0–100.0)
Platelets: 216 10*3/uL (ref 150–400)
RBC: 3.2 MIL/uL — ABNORMAL LOW (ref 3.87–5.11)
RDW: 15.9 % — ABNORMAL HIGH (ref 11.5–15.5)
WBC: 10.3 10*3/uL (ref 4.0–10.5)
nRBC: 0 % (ref 0.0–0.2)

## 2023-01-07 LAB — GLUCOSE, CAPILLARY
Glucose-Capillary: 97 mg/dL (ref 70–99)
Glucose-Capillary: 208 mg/dL — ABNORMAL HIGH (ref 70–99)
Glucose-Capillary: 180 mg/dL — ABNORMAL HIGH (ref 70–99)
Glucose-Capillary: 198 mg/dL — ABNORMAL HIGH (ref 70–99)

## 2023-01-07 LAB — MAGNESIUM: Magnesium: 2.1 mg/dL (ref 1.7–2.4)

## 2023-01-07 NOTE — Plan of Care (Signed)
  Problem: Health Behavior/Discharge Planning: Goal: Ability to manage health-related needs will improve Outcome: Progressing   Problem: Clinical Measurements: Goal: Ability to maintain clinical measurements within normal limits will improve Outcome: Progressing Goal: Will remain free from infection Outcome: Progressing Goal: Diagnostic test results will improve Outcome: Progressing Goal: Respiratory complications will improve Outcome: Progressing Goal: Cardiovascular complication will be avoided Outcome: Progressing   Problem: Cardiac: Goal: Ability to achieve and maintain adequate cardiopulmonary perfusion will improve Outcome: Progressing   Problem: Activity: Goal: Risk for activity intolerance will decrease Outcome: Adequate for Discharge   Problem: Nutrition: Goal: Adequate nutrition will be maintained Outcome: Adequate for Discharge   Problem: Coping: Goal: Level of anxiety will decrease Outcome: Adequate for Discharge   Problem: Elimination: Goal: Will not experience complications related to bowel motility Outcome: Adequate for Discharge Goal: Will not experience complications related to urinary retention Outcome: Adequate for Discharge   Problem: Pain Managment: Goal: General experience of comfort will improve Outcome: Adequate for Discharge   Problem: Safety: Goal: Ability to remain free from injury will improve Outcome: Adequate for Discharge   Problem: Education: Goal: Ability to demonstrate management of disease process will improve Outcome: Adequate for Discharge Goal: Ability to verbalize understanding of medication therapies will improve Outcome: Adequate for Discharge   Problem: Activity: Goal: Capacity to carry out activities will improve Outcome: Adequate for Discharge   Problem: Education: Goal: Individualized Educational Video(s) Outcome: Not Applicable

## 2023-01-07 NOTE — Plan of Care (Signed)
Cardiology will follow peripherally, no changes anticipated changes. Rates are controlled. When closer to dispo, we can work on [employer]. For now stop eliquis.

## 2023-01-07 NOTE — Progress Notes (Signed)
PROGRESS NOTE    Dawn Lawson  [id number] DOB: 10-19-1929 DOA: 01/01/2023 PCP: [name], NP   Brief Narrative:    89 y.o. female with medical history significant of Pre-diabetes, arthritis, Hypertensive heart disease, Grade II diastolic dysfunction in setting of moderate asymmetric left ventricular hypertrophy of the basal-septal segment with EF of 65% noted on echo 2021, with in history of chronic b/l lower extremity swelling and interim history of several months of  progressive weakness with sharp decrease of mobility over last 1 week.  Patient lives alone with help from family as needed. In the ER, found to have a fib and suspected CHF exacerbation.  Patient is getting diuresed with direction from cardiology.  PT recommended SNF, bed to be available on Monday.  Assessment & Plan:  Principal Problem:   CHF (congestive heart failure) (HCC) Active Problems:   Acute diastolic heart failure (HCC)    Acute on chronic diastolic heart failure exacerbation Acute respiratory distress secondary to CHF exacerbation -Echocardiogram shows ejection fraction 65% with moderately enlarged RV and elevated PASP.  Continue diuretics, monitor urine output.  Replete electrolytes as needed, p.o. Lasix 40 mg daily.  Left thigh hematoma with ecchymosis - CT thigh consistent with left inner thigh hematoma.  Anticoagulation discontinued.  Warm compresses, monitor. Hb 10.6 > 8.8 > 9.3  Elevated troponin, NSTEMI - Continue Crestor Monday Wednesday and Friday.  Will discontinue antiplatelets and anticoagulation  Abnormal breath sounds - Likely from fluid overload but some rhonchi as well.  Does have previous history of smoking but no formal diagnosis of COPD.  Steroids and bronchodilators.  Total 5-day course.   New Onset Afib - Avoid anticoagulation given high risk of fall.  Would opt for rate control strategy and avoid long-term anticoagulation.  Family in agreement.  Family understands she will  be at high risk of CVA.  Elevated BUN - Secondary to steroid use.  Creatinine stable.    Pre-diabetes -A1c 6.22 Sep 2022.  Closely monitor   Hypertension Stable blood pressure   Debility  PT/OT, uses walker at home   Obesity Estimated body mass index is 32.45 kg/m as calculated from the following:   Height as of this encounter: 5\' 2"  (1.575 m).   Weight as of this encounter: 80.5 kg.    lipedema with h/o tibia osteo -Chronic knee on Keflex at home  PT/OT-SNF  DVT prophylaxis: Hold AC due to hematoma. Can't tolerate SCDs Code Status: DNR Family Communication: Son at bedside  Status is: Inpatient Per [facility] SNF bed available on Monday    Subjective: Left thigh looks more ecchymotic.  No other complaints.  Patient denies any other complaints  Examination: Constitutional: Not in acute distress Respiratory: Clear to auscultation bilaterally Cardiovascular: Normal sinus rhythm, no rubs Abdomen: Nontender nondistended good bowel sounds Musculoskeletal: No edema noted Skin: Left thigh erythema, ecchymosis.  Appears slightly darker. Neurologic: CN 2-12 grossly intact.  And nonfocal Psychiatric: Normal judgment and insight. Alert and oriented x 3. Normal mood. Objective: Vitals:   01/06/23 2301 01/06/23 2302 01/07/23 0117 01/07/23 0515  BP:    (!) 118/43  Pulse:  77  68  Resp:    17  Temp:    98.1 F (36.7 C)  TempSrc:    Oral  SpO2: 96% 95% 97% 97%  Weight:    77.8 kg  Height:        Intake/Output Summary (Last 24 hours) at 01/07/2023 1216 Last data filed at 01/07/2023 1115 Gross per 24 hour  Intake 2036 ml  Output 1400 ml  Net 636 ml   Filed Weights   01/05/23 0759 01/06/23 0631 01/07/23 0515  Weight: 77.4 kg 78.1 kg 77.8 kg     Data Reviewed:   CBC: Recent Labs  Lab 01/03/23 0504 01/04/23 0513 01/05/23 0523 01/06/23 0537 01/07/23 0500  WBC 13.1* 6.6 11.5* 10.9* 10.3  HGB 10.9* 10.9* 10.6* 8.8* 9.3*  HCT 33.9* 33.3* 32.4* 27.3* 28.7*  MCV 89.7  87.2 88.8 91.3 89.7  PLT 231 248 247 172 [id number]   Basic Metabolic Panel: Recent Labs  Lab 01/03/23 0504 01/04/23 0513 01/05/23 0523 01/06/23 0537 01/07/23 0500  NA 131* 134* 137 134* 133*  K 4.0 3.9 3.9 4.1 3.8  CL 94* 92* 96* 94* 93*  CO2 27 30 28 27 29   GLUCOSE 91 133* 170* 146* 132*  BUN 46* 43* 51* 54* 54*  CREATININE 1.19* 1.25* 1.22* 1.05* 1.12*  CALCIUM 8.3* 8.4* 8.4* 8.2* 8.1*  MG  --   --  2.0 2.0 2.1   GFR: Estimated Creatinine Clearance: 31.6 mL/min (A) (by C-G formula based on SCr of 1.12 mg/dL (H)). Liver Function Tests: Recent Labs  Lab 01/01/23 0955 01/02/23 0058  AST 16 14*  ALT 11 13  ALKPHOS 48 47  BILITOT 0.7 0.7  PROT 5.6* 5.6*  ALBUMIN 3.1* 3.4*   No results for input(s): "LIPASE", "AMYLASE" in the last 168 hours. No results for input(s): "AMMONIA" in the last 168 hours. Coagulation Profile: Recent Labs  Lab 01/02/23 0058  INR 1.2   Cardiac Enzymes: No results for input(s): "CKTOTAL", "CKMB", "CKMBINDEX", "TROPONINI" in the last 168 hours. BNP (last 3 results) No results for input(s): "PROBNP" in the last 8760 hours. HbA1C: No results for input(s): "HGBA1C" in the last 72 hours. CBG: Recent Labs  Lab 01/06/23 1121 01/06/23 1655 01/06/23 2117 01/07/23 0741 01/07/23 1116  GLUCAP 140* 165* 215* 97 208*   Lipid Profile: No results for input(s): "CHOL", "HDL", "LDLCALC", "TRIG", "CHOLHDL", "LDLDIRECT" in the last 72 hours.  Thyroid Function Tests: No results for input(s): "TSH", "T4TOTAL", "FREET4", "T3FREE", "THYROIDAB" in the last 72 hours.  Anemia Panel: No results for input(s): "VITAMINB12", "FOLATE", "FERRITIN", "TIBC", "IRON", "RETICCTPCT" in the last 72 hours. Sepsis Labs: Recent Labs  Lab 01/02/23 0754 01/02/23 1107  LATICACIDVEN 1.5 1.9    Recent Results (from the past 240 hour(s))  Resp panel by RT-PCR (RSV, Flu A&B, Covid) Anterior Nasal Swab     Status: None   Collection Time: 01/01/23  9:23 AM   Specimen: Anterior  Nasal Swab  Result Value Ref Range Status   SARS Coronavirus 2 by RT PCR NEGATIVE NEGATIVE Final    Comment: (NOTE) SARS-CoV-2 target nucleic acids are NOT DETECTED.  The SARS-CoV-2 RNA is generally detectable in upper respiratory specimens during the acute phase of infection. The lowest concentration of SARS-CoV-2 viral copies this assay can detect is 138 copies/mL. A negative result does not preclude SARS-Cov-2 infection and should not be used as the sole basis for treatment or other patient management decisions. A negative result may occur with  improper specimen collection/handling, submission of specimen other than nasopharyngeal swab, presence of viral mutation(s) within the areas targeted by this assay, and inadequate number of viral copies(<138 copies/mL). A negative result must be combined with clinical observations, patient history, and epidemiological information. The expected result is Negative.  Fact Sheet for Patients:  EntrepreneurPulse.com.au  Fact Sheet for Healthcare Providers:  IncredibleEmployment.be  This test is no t yet  approved or cleared by the [place] and  has been authorized for detection and/or diagnosis of SARS-CoV-2 by FDA under an Emergency Use Authorization (EUA). This EUA will remain  in effect (meaning this test can be used) for the duration of the COVID-19 declaration under Section 564(b)(1) of the Act, 21 U.S.C.section 360bbb-3(b)(1), unless the authorization is terminated  or revoked sooner.       Influenza A by PCR NEGATIVE NEGATIVE Final   Influenza B by PCR NEGATIVE NEGATIVE Final    Comment: (NOTE) The Xpert Xpress SARS-CoV-2/FLU/RSV plus assay is intended as an aid in the diagnosis of influenza from Nasopharyngeal swab specimens and should not be used as a sole basis for treatment. Nasal washings and aspirates are unacceptable for Xpert Xpress SARS-CoV-2/FLU/RSV testing.  Fact Sheet for  Patients: EntrepreneurPulse.com.au  Fact Sheet for Healthcare Providers: IncredibleEmployment.be  This test is not yet approved or cleared by the [place] FDA and has been authorized for detection and/or diagnosis of SARS-CoV-2 by FDA under an Emergency Use Authorization (EUA). This EUA will remain in effect (meaning this test can be used) for the duration of the COVID-19 declaration under Section 564(b)(1) of the Act, 21 U.S.C. section 360bbb-3(b)(1), unless the authorization is terminated or revoked.     Resp Syncytial Virus by PCR NEGATIVE NEGATIVE Final    Comment: (NOTE) Fact Sheet for Patients: EntrepreneurPulse.com.au  Fact Sheet for Healthcare Providers: IncredibleEmployment.be  This test is not yet approved or cleared by the [place] FDA and has been authorized for detection and/or diagnosis of SARS-CoV-2 by FDA under an Emergency Use Authorization (EUA). This EUA will remain in effect (meaning this test can be used) for the duration of the COVID-19 declaration under Section 564(b)(1) of the Act, 21 U.S.C. section 360bbb-3(b)(1), unless the authorization is terminated or revoked.  Performed at [facility], [place] [address] [place],  [address]          Radiology Studies: CT FEMUR LEFT WO CONTRAST  Result Date: 01/05/2023 CLINICAL DATA:  Thigh soft tissue mass, deep. Evaluate for hematoma. EXAM: CT OF THE LOWER LEFT EXTREMITY WITHOUT CONTRAST TECHNIQUE: Multidetector CT imaging of the lower left extremity was performed according to the standard protocol. RADIATION DOSE REDUCTION: This exam was performed according to the departmental dose-optimization program which includes automated exposure control, adjustment of the mA and/or kV according to patient size and/or use of iterative reconstruction technique. COMPARISON:  None Available. FINDINGS:  Bones/Joint/Cartilage There is diffuse decreased bone mineralization. Markedly severe medial greater than lateral knee compartment joint space narrowing bone-on-bone contact and large peripheral osteophytes. There is also severe patellofemoral joint space narrowing and peripheral osteophytosis. Moderate to severe pubic symphysis joint space narrowing and peripheral osteophytosis. Moderate to severe from acetabular joint space narrowing with moderate peripheral acetabular and inferior humeral head-neck junction degenerative osteophytes. Ligaments Suboptimally assessed by CT. Muscles and Tendons Normal density and size of the regional left thigh musculature. Soft tissues There is moderate subcutaneous fat edema and swelling throughout the visualized lower left anterior abdominal wall and the left thigh diffusely, greatest within the lateral greater than medial aspect of the proximal mid left thigh. There are multiple lobular structures of internal density ranging from approximately 31-35 Hounsfield units which may represent a hematoma versus soft tissue masses. The dominant coalescing collection measures up to approximately 4.4 x 6.4 x 12.5 cm (transverse by AP by craniocaudal, as measured on axial series 6, image 229 and coronal series 9, image 93). Given  the lobular morphology and smaller, separate minimally contacting oval masses at the superior aspect, a hematoma is favored over contiguous soft tissue masses. Partially visualized markedly high-grade sigmoid diverticulosis without definite inflammatory change. Surgical sutures overlie the midline anterior superior pelvis. There are high-grade atherosclerotic calcifications. IMPRESSION: 1. There is a 4.4 x 6.4 x 12.5 cm lobular soft tissue density mass within the lateral greater than medial aspect of the proximal left thigh. Given the lobular morphology and the small, only minimally contacting components superiorly, this is favored to represent a hematoma, however  contiguous soft tissue masses can have a similar appearance. Recommend close clinical follow-up to ensure this suspected hematoma resolves clinically. 2. There is moderate subcutaneous fat edema and swelling throughout the visualized lower left anterior abdominal wall and the left thigh. 3. Partially visualized high-grade sigmoid diverticulosis without definite inflammatory change. 4. Severe tricompartmental osteoarthritis of the left knee. 5. Moderate to severe left hip osteoarthritis. Electronically Signed   By: Yvonne Kendall M.D.   On: 01/05/2023 12:54        Scheduled Meds:  cephALEXin  500 mg Oral BID   furosemide  40 mg Oral Daily   insulin aspart  0-9 Units Subcutaneous TID WC   rosuvastatin  5 mg Oral Q M,W,F   Continuous Infusions:     LOS: 6 days   Time spent= 35 mins    Ronson Hagins Arsenio Loader, MD [facility]  If 7PM-7AM, please contact night-coverage  01/07/2023, 12:16 PM

## 2023-01-08 DIAGNOSIS — E785 Hyperlipidemia, unspecified: Secondary | ICD-10-CM | POA: Diagnosis not present

## 2023-01-08 DIAGNOSIS — R5383 Other fatigue: Secondary | ICD-10-CM | POA: Diagnosis not present

## 2023-01-08 DIAGNOSIS — Z7401 Bed confinement status: Secondary | ICD-10-CM | POA: Diagnosis not present

## 2023-01-08 DIAGNOSIS — M6281 Muscle weakness (generalized): Secondary | ICD-10-CM | POA: Diagnosis not present

## 2023-01-08 DIAGNOSIS — R9431 Abnormal electrocardiogram [ECG] [EKG]: Secondary | ICD-10-CM | POA: Diagnosis not present

## 2023-01-08 DIAGNOSIS — I959 Hypotension, unspecified: Secondary | ICD-10-CM | POA: Diagnosis not present

## 2023-01-08 DIAGNOSIS — R531 Weakness: Secondary | ICD-10-CM | POA: Diagnosis not present

## 2023-01-08 DIAGNOSIS — G9341 Metabolic encephalopathy: Secondary | ICD-10-CM | POA: Diagnosis not present

## 2023-01-08 DIAGNOSIS — N1832 Chronic kidney disease, stage 3b: Secondary | ICD-10-CM | POA: Diagnosis not present

## 2023-01-08 DIAGNOSIS — I4891 Unspecified atrial fibrillation: Secondary | ICD-10-CM | POA: Diagnosis not present

## 2023-01-08 DIAGNOSIS — E782 Mixed hyperlipidemia: Secondary | ICD-10-CM | POA: Diagnosis not present

## 2023-01-08 DIAGNOSIS — R7303 Prediabetes: Secondary | ICD-10-CM | POA: Diagnosis not present

## 2023-01-08 DIAGNOSIS — J9621 Acute and chronic respiratory failure with hypoxia: Secondary | ICD-10-CM | POA: Diagnosis not present

## 2023-01-08 DIAGNOSIS — R0902 Hypoxemia: Secondary | ICD-10-CM | POA: Diagnosis not present

## 2023-01-08 DIAGNOSIS — E669 Obesity, unspecified: Secondary | ICD-10-CM | POA: Diagnosis not present

## 2023-01-08 DIAGNOSIS — R2689 Other abnormalities of gait and mobility: Secondary | ICD-10-CM | POA: Diagnosis not present

## 2023-01-08 DIAGNOSIS — R739 Hyperglycemia, unspecified: Secondary | ICD-10-CM | POA: Diagnosis not present

## 2023-01-08 DIAGNOSIS — D649 Anemia, unspecified: Secondary | ICD-10-CM | POA: Diagnosis not present

## 2023-01-08 DIAGNOSIS — Z66 Do not resuscitate: Secondary | ICD-10-CM | POA: Diagnosis not present

## 2023-01-08 DIAGNOSIS — Z96651 Presence of right artificial knee joint: Secondary | ICD-10-CM | POA: Diagnosis not present

## 2023-01-08 DIAGNOSIS — R54 Age-related physical debility: Secondary | ICD-10-CM | POA: Diagnosis not present

## 2023-01-08 DIAGNOSIS — Z87891 Personal history of nicotine dependence: Secondary | ICD-10-CM | POA: Diagnosis not present

## 2023-01-08 DIAGNOSIS — Z515 Encounter for palliative care: Secondary | ICD-10-CM | POA: Diagnosis not present

## 2023-01-08 DIAGNOSIS — R1312 Dysphagia, oropharyngeal phase: Secondary | ICD-10-CM | POA: Diagnosis not present

## 2023-01-08 DIAGNOSIS — I4821 Permanent atrial fibrillation: Secondary | ICD-10-CM | POA: Diagnosis not present

## 2023-01-08 DIAGNOSIS — R41841 Cognitive communication deficit: Secondary | ICD-10-CM | POA: Diagnosis not present

## 2023-01-08 DIAGNOSIS — J189 Pneumonia, unspecified organism: Secondary | ICD-10-CM | POA: Diagnosis not present

## 2023-01-08 DIAGNOSIS — Z90712 Acquired absence of cervix with remaining uterus: Secondary | ICD-10-CM | POA: Diagnosis not present

## 2023-01-08 DIAGNOSIS — L89152 Pressure ulcer of sacral region, stage 2: Secondary | ICD-10-CM | POA: Diagnosis not present

## 2023-01-08 DIAGNOSIS — N189 Chronic kidney disease, unspecified: Secondary | ICD-10-CM | POA: Diagnosis not present

## 2023-01-08 DIAGNOSIS — I5033 Acute on chronic diastolic (congestive) heart failure: Secondary | ICD-10-CM | POA: Diagnosis not present

## 2023-01-08 DIAGNOSIS — Z8249 Family history of ischemic heart disease and other diseases of the circulatory system: Secondary | ICD-10-CM | POA: Diagnosis not present

## 2023-01-08 DIAGNOSIS — A419 Sepsis, unspecified organism: Secondary | ICD-10-CM | POA: Diagnosis not present

## 2023-01-08 DIAGNOSIS — J690 Pneumonitis due to inhalation of food and vomit: Secondary | ICD-10-CM | POA: Diagnosis not present

## 2023-01-08 DIAGNOSIS — X58XXXD Exposure to other specified factors, subsequent encounter: Secondary | ICD-10-CM | POA: Diagnosis present

## 2023-01-08 DIAGNOSIS — R2243 Localized swelling, mass and lump, lower limb, bilateral: Secondary | ICD-10-CM | POA: Diagnosis not present

## 2023-01-08 DIAGNOSIS — J90 Pleural effusion, not elsewhere classified: Secondary | ICD-10-CM | POA: Diagnosis not present

## 2023-01-08 DIAGNOSIS — H3530 Unspecified macular degeneration: Secondary | ICD-10-CM | POA: Diagnosis not present

## 2023-01-08 DIAGNOSIS — R6520 Severe sepsis without septic shock: Secondary | ICD-10-CM | POA: Diagnosis not present

## 2023-01-08 DIAGNOSIS — E860 Dehydration: Secondary | ICD-10-CM | POA: Diagnosis not present

## 2023-01-08 DIAGNOSIS — E8809 Other disorders of plasma-protein metabolism, not elsewhere classified: Secondary | ICD-10-CM | POA: Diagnosis not present

## 2023-01-08 DIAGNOSIS — R059 Cough, unspecified: Secondary | ICD-10-CM | POA: Diagnosis not present

## 2023-01-08 DIAGNOSIS — T148XXA Other injury of unspecified body region, initial encounter: Secondary | ICD-10-CM

## 2023-01-08 DIAGNOSIS — R0602 Shortness of breath: Secondary | ICD-10-CM | POA: Diagnosis not present

## 2023-01-08 DIAGNOSIS — M8668 Other chronic osteomyelitis, other site: Secondary | ICD-10-CM | POA: Diagnosis not present

## 2023-01-08 DIAGNOSIS — I5031 Acute diastolic (congestive) heart failure: Secondary | ICD-10-CM | POA: Diagnosis not present

## 2023-01-08 DIAGNOSIS — M158 Other polyosteoarthritis: Secondary | ICD-10-CM | POA: Diagnosis not present

## 2023-01-08 DIAGNOSIS — K5730 Diverticulosis of large intestine without perforation or abscess without bleeding: Secondary | ICD-10-CM | POA: Diagnosis not present

## 2023-01-08 DIAGNOSIS — I422 Other hypertrophic cardiomyopathy: Secondary | ICD-10-CM | POA: Diagnosis not present

## 2023-01-08 DIAGNOSIS — I214 Non-ST elevation (NSTEMI) myocardial infarction: Secondary | ICD-10-CM | POA: Diagnosis not present

## 2023-01-08 DIAGNOSIS — I10 Essential (primary) hypertension: Secondary | ICD-10-CM | POA: Diagnosis not present

## 2023-01-08 DIAGNOSIS — J810 Acute pulmonary edema: Secondary | ICD-10-CM | POA: Diagnosis not present

## 2023-01-08 DIAGNOSIS — J918 Pleural effusion in other conditions classified elsewhere: Secondary | ICD-10-CM | POA: Diagnosis not present

## 2023-01-08 DIAGNOSIS — J80 Acute respiratory distress syndrome: Secondary | ICD-10-CM | POA: Diagnosis not present

## 2023-01-08 DIAGNOSIS — M7981 Nontraumatic hematoma of soft tissue: Secondary | ICD-10-CM | POA: Diagnosis not present

## 2023-01-08 DIAGNOSIS — D631 Anemia in chronic kidney disease: Secondary | ICD-10-CM | POA: Diagnosis not present

## 2023-01-08 DIAGNOSIS — Z6834 Body mass index (BMI) 34.0-34.9, adult: Secondary | ICD-10-CM | POA: Diagnosis not present

## 2023-01-08 DIAGNOSIS — J9620 Acute and chronic respiratory failure, unspecified whether with hypoxia or hypercapnia: Secondary | ICD-10-CM | POA: Diagnosis not present

## 2023-01-08 DIAGNOSIS — I482 Chronic atrial fibrillation, unspecified: Secondary | ICD-10-CM | POA: Diagnosis not present

## 2023-01-08 DIAGNOSIS — R627 Adult failure to thrive: Secondary | ICD-10-CM | POA: Diagnosis not present

## 2023-01-08 DIAGNOSIS — I130 Hypertensive heart and chronic kidney disease with heart failure and stage 1 through stage 4 chronic kidney disease, or unspecified chronic kidney disease: Secondary | ICD-10-CM | POA: Diagnosis not present

## 2023-01-08 DIAGNOSIS — Z792 Long term (current) use of antibiotics: Secondary | ICD-10-CM | POA: Diagnosis not present

## 2023-01-08 DIAGNOSIS — R058 Other specified cough: Secondary | ICD-10-CM | POA: Diagnosis not present

## 2023-01-08 LAB — CBC
HCT: 25.6 % — ABNORMAL LOW (ref 36.0–46.0)
Hemoglobin: 8.6 g/dL — ABNORMAL LOW (ref 12.0–15.0)
MCH: 29.5 pg (ref 26.0–34.0)
MCHC: 33.6 g/dL (ref 30.0–36.0)
MCV: 87.7 fL (ref 80.0–100.0)
Platelets: 223 10*3/uL (ref 150–400)
RBC: 2.92 MIL/uL — ABNORMAL LOW (ref 3.87–5.11)
RDW: 16 % — ABNORMAL HIGH (ref 11.5–15.5)
WBC: 10.1 10*3/uL (ref 4.0–10.5)
nRBC: 0 % (ref 0.0–0.2)

## 2023-01-08 LAB — BASIC METABOLIC PANEL
Anion gap: 9 (ref 5–15)
BUN: 53 mg/dL — ABNORMAL HIGH (ref 8–23)
CO2: 31 mmol/L (ref 22–32)
Calcium: 8.1 mg/dL — ABNORMAL LOW (ref 8.9–10.3)
Chloride: 94 mmol/L — ABNORMAL LOW (ref 98–111)
Creatinine, Ser: 1.33 mg/dL — ABNORMAL HIGH (ref 0.44–1.00)
GFR, Estimated: 38 mL/min — ABNORMAL LOW (ref >60)
Glucose, Bld: 116 mg/dL — ABNORMAL HIGH (ref 70–99)
Potassium: 3.8 mmol/L (ref 3.5–5.1)
Sodium: 134 mmol/L — ABNORMAL LOW (ref 135–145)

## 2023-01-08 LAB — GLUCOSE, CAPILLARY
Glucose-Capillary: 139 mg/dL — ABNORMAL HIGH (ref 70–99)
Glucose-Capillary: 145 mg/dL — ABNORMAL HIGH (ref 70–99)

## 2023-01-08 LAB — MAGNESIUM: Magnesium: 2.1 mg/dL (ref 1.7–2.4)

## 2023-01-08 MED ORDER — FUROSEMIDE 40 MG PO TABS: 40.0000 mg | ORAL_TABLET | Freq: Every day | ORAL | Status: DC

## 2023-01-08 MED ORDER — FUROSEMIDE 20 MG PO TABS: 20.0000 mg | ORAL_TABLET | Freq: Every day | ORAL | Status: DC

## 2023-01-08 MED ORDER — SENNOSIDES-DOCUSATE SODIUM 8.6-50 MG PO TABS: 1.0000 | ORAL_TABLET | Freq: Every evening | ORAL | Status: DC | PRN

## 2023-01-08 MED ORDER — POTASSIUM CHLORIDE CRYS ER 20 MEQ PO TBCR
40.0000 meq | EXTENDED_RELEASE_TABLET | Freq: Once | ORAL | Status: AC
  Administered 2023-01-08: 40 meq via ORAL
  Filled 2023-01-08: qty 2

## 2023-01-08 NOTE — Discharge Summary (Addendum)
Physician Discharge Summary  Dawn Lawson [id number] DOB: Aug 03, 1929 DOA: 01/01/2023  PCP: [name], NP  Admit date: 01/01/2023 Discharge date: 01/08/2023  Admitted From: Home Disposition:  SNF  Recommendations for Outpatient Follow-up:  Follow up with PCP in 1-2 weeks Please obtain BMP/CBC in 3-5 days Patient should not be on any anticoagulation at this time. Lasix 20 mg daily.  Bowel regimen.  Assess daily needs. She has been chronically on Keflex Discontinue bisoprolol/HCTZ, losartan.  In the future can be addressed as necessary May require as needed oxygen but as she ambulates this should improve and should be weaned off. Cont 1-2L [place], wean appropriate  Discharge Condition: Stable CODE STATUS: DNR Diet recommendation: Heart healthy  Brief/Interim Summary:   89 y.o. female with medical history significant of Pre-diabetes, arthritis, Hypertensive heart disease, Grade II diastolic dysfunction in setting of moderate asymmetric left ventricular hypertrophy of the basal-septal segment with EF of 65% noted on echo 2021, with in history of chronic b/l lower extremity swelling and interim history of several months of  progressive weakness with sharp decrease of mobility over last 1 week.  Patient lives alone with help from family as needed. In the ER, found to have a fib and suspected CHF exacerbation.  Patient is getting diuresed with direction from cardiology.  PT recommended SNF, bed to be available on Monday.   Assessment & Plan:  Principal Problem:   CHF (congestive heart failure) (HCC) Active Problems:   Acute diastolic heart failure (HCC)     Acute on chronic diastolic heart failure exacerbation Acute respiratory distress secondary to CHF exacerbation -Echocardiogram shows ejection fraction 65% with moderately enlarged RV and elevated PASP.  Continue diuretics, monitor urine output.  Replete electrolytes as needed, p.o. Lasix 20 mg p.o. daily.  Assess further needs  as becomes necessary   Left thigh hematoma with ecchymosis - CT thigh consistent with left inner thigh hematoma.  Anticoagulation discontinued.  Warm compresses, hemoglobin stable at 8.6.  Repeat CBC in next 3-5 days Case briefly discussed with general surgery.  No obvious evidence of compartment syndrome or infection at this time.   Elevated troponin, NSTEMI - Continue Crestor Monday Wednesday and Friday.  Will discontinue antiplatelets and anticoagulation   Abnormal breath sounds, resolved - Likely from fluid overload but some rhonchi as well.  Does have previous history of smoking but no formal diagnosis of COPD.  Steroids and bronchodilators.  Total 5-day course.   New Onset Afib - Avoid anticoagulation given high risk of fall.  Would opt for rate control strategy and avoid long-term anticoagulation.  Family in agreement.  Family understands she will be at high risk of CVA.   Elevated BUN - Secondary to steroid use.  Creatinine stable.    Pre-diabetes -A1c 6.22 Sep 2022.  Closely monitor   Hypertension Stable blood pressure   Debility  PT/OT, uses walker at home   Obesity Estimated body mass index is 32.45 kg/m as calculated from the following:   Height as of this encounter: 5\' 2"  (1.575 m).   Weight as of this encounter: 80.5 kg.    lipedema with h/o tibia osteo -Chronic knee on Keflex at home   PT/OT-SNF   Discharge Diagnoses:  Principal Problem:   CHF (congestive heart failure) (HCC) Active Problems:   Acute diastolic heart failure [facility])      Consultations: [facility] cardiology  Subjective: Sitting up in the chair.  No complaints.  Family at bedside.  Discharge Exam: Vitals:   01/07/23  2102 01/08/23 0429  BP: (!) 136/47 137/61  Pulse: 89 78  Resp: 20 16  Temp: 98.2 F (36.8 C) 98.4 F (36.9 C)  SpO2: 97% 100%   Vitals:   01/07/23 1312 01/07/23 2102 01/08/23 0428 01/08/23 0429  BP: (!) 125/53 (!) 136/47  137/61  Pulse: 76 89  78  Resp: 15 20  16    Temp: 98.5 F (36.9 C) 98.2 F (36.8 C)  98.4 F (36.9 C)  TempSrc: Oral Oral  Oral  SpO2: 96% 97%  100%  Weight:   84.7 kg   Height:        General: Pt is alert, awake, not in acute distress Cardiovascular: Very minimal by basilar rhonchi Respiratory: CTA bilaterally, no wheezing, no rhonchi Abdominal: Soft, NT, ND, bowel sounds + Extremities: no edema, no cyanosis  Discharge Instructions   Allergies as of 01/08/2023       Reactions   Penicillins Hives, Itching   Tolerated Ancef 11/17 and cefepime 02/2017 Has patient had a PCN reaction causing immediate rash, facial/tongue/throat swelling, SOB or lightheadedness with hypotension: Yes Has patient had a PCN reaction causing severe rash involving mucus membranes or skin necrosis: No Has patient had a PCN reaction that required hospitalization: No Has patient had a PCN reaction occurring within the last 10 years:  # # YES # #  If all of the above answers are "NO", then may proceed with Cephalosporin use.   Chocolate Other (See Comments)   Migraines   Tape Itching   Ampicillin Hives, Itching        Medication List     STOP taking these medications    bisoprolol-hydrochlorothiazide 5-6.25 MG tablet Commonly known as: ZIAC   losartan 50 MG tablet Commonly known as: COZAAR       TAKE these medications    acetaminophen 500 MG tablet Commonly known as: TYLENOL Take 500-1,000 mg by mouth every 6 (six) hours as needed for moderate pain.   ARTIFICIAL TEARS PF OP Place 1 drop into both eyes as needed (for dry eyes).   cephALEXin 500 MG capsule Commonly known as: Keflex Take 1 capsule (500 mg total) by mouth 2 (two) times daily.   furosemide 20 MG tablet Commonly known as: LASIX Take 1 tablet (20 mg total) by mouth daily.   MULTIVITAMIN PO Take 1 tablet by mouth daily.   PRESERVISION AREDS PO Take 1 capsule by mouth 2 (two) times daily.   rosuvastatin 5 MG tablet Commonly known as: CRESTOR TAKE 1 TABLET  BY MOUTH AT BEDTIME EVERY MONDAY, WEDNESDAY, AND FRIDAY NIGHT.   senna-docusate 8.6-50 MG tablet Commonly known as: Senokot-S Take 1 tablet by mouth at bedtime as needed for moderate constipation.        Follow-up Information     [name], NP Follow up.   Specialty: Family Medicine Contact information: [place] [address] [phone]         [name], NP Follow up in 1 week(s).   Specialty: Family Medicine Contact information: [place] [address] [phone]                Allergies  Allergen Reactions   Penicillins Hives and Itching    Tolerated Ancef 11/17 and cefepime 02/2017  Has patient had a PCN reaction causing immediate rash, facial/tongue/throat swelling, SOB or lightheadedness with hypotension: Yes Has patient had a PCN reaction causing severe rash involving mucus membranes or skin  necrosis: No Has patient had a PCN reaction that required hospitalization: No Has patient had a PCN reaction occurring within the last 10 years:  # # YES # #  If all of the above answers are "NO", then may proceed with Cephalosporin use.   Chocolate Other (See Comments)    Migraines    Tape Itching   Ampicillin Hives and Itching    You were cared for by a hospitalist during your hospital stay. If you have any questions about your discharge medications or the care you received while you were in the hospital after you are discharged, you can call the unit and asked to speak with the hospitalist on call if the hospitalist that took care of you is not available. Once you are discharged, your primary care physician will handle any further medical issues. Please note that no refills for any discharge medications will be authorized once you are discharged, as it is imperative that you return to your primary care physician (or establish a relationship with a primary care physician if you do not have one) for your  aftercare needs so that they can reassess your need for medications and monitor your lab values.  You were cared for by a hospitalist during your hospital stay. If you have any questions about your discharge medications or the care you received while you were in the hospital after you are discharged, you can call the unit and asked to speak with the hospitalist on call if the hospitalist that took care of you is not available. Once you are discharged, your primary care physician will handle any further medical issues. Please note that NO REFILLS for any discharge medications will be authorized once you are discharged, as it is imperative that you return to your primary care physician (or establish a relationship with a primary care physician if you do not have one) for your aftercare needs so that they can reassess your need for medications and monitor your lab values.  Please request your Prim.MD to go over all Hospital Tests and Procedure/Radiological results at the follow up, please get all Hospital records sent to your Prim MD by signing hospital release before you go home.  Get CBC, CMP, 2 view Chest X ray checked  by Primary MD during your next visit or SNF MD in 5-7 days ( we routinely change or add medications that can affect your baseline labs and fluid status, therefore we recommend that you get the mentioned basic workup next visit with your PCP, your PCP may decide not to get them or add new tests based on their clinical decision)  On your next visit with your primary care physician please Get Medicines reviewed and adjusted.  If you experience worsening of your admission symptoms, develop shortness of breath, life threatening emergency, suicidal or homicidal thoughts you must seek medical attention immediately by calling 911 or calling your MD immediately  if symptoms less severe.  You Must read complete instructions/literature along with all the possible adverse reactions/side effects for  all the Medicines you take and that have been prescribed to you. Take any new Medicines after you have completely understood and accpet all the possible adverse reactions/side effects.   Do not drive, operate heavy machinery, perform activities at heights, swimming or participation in water activities or provide baby sitting services if your were admitted for syncope or siezures until you have seen by Primary MD or a Neurologist and advised to do so again.  Do not drive  when taking Pain medications.   Procedures/Studies: CT FEMUR LEFT WO CONTRAST  Result Date: 01/05/2023 CLINICAL DATA:  Thigh soft tissue mass, deep. Evaluate for hematoma. EXAM: CT OF THE LOWER LEFT EXTREMITY WITHOUT CONTRAST TECHNIQUE: Multidetector CT imaging of the lower left extremity was performed according to the standard protocol. RADIATION DOSE REDUCTION: This exam was performed according to the departmental dose-optimization program which includes automated exposure control, adjustment of the mA and/or kV according to patient size and/or use of iterative reconstruction technique. COMPARISON:  None Available. FINDINGS: Bones/Joint/Cartilage There is diffuse decreased bone mineralization. Markedly severe medial greater than lateral knee compartment joint space narrowing bone-on-bone contact and large peripheral osteophytes. There is also severe patellofemoral joint space narrowing and peripheral osteophytosis. Moderate to severe pubic symphysis joint space narrowing and peripheral osteophytosis. Moderate to severe from acetabular joint space narrowing with moderate peripheral acetabular and inferior humeral head-neck junction degenerative osteophytes. Ligaments Suboptimally assessed by CT. Muscles and Tendons Normal density and size of the regional left thigh musculature. Soft tissues There is moderate subcutaneous fat edema and swelling throughout the visualized lower left anterior abdominal wall and the left thigh diffusely,  greatest within the lateral greater than medial aspect of the proximal mid left thigh. There are multiple lobular structures of internal density ranging from approximately 31-35 Hounsfield units which may represent a hematoma versus soft tissue masses. The dominant coalescing collection measures up to approximately 4.4 x 6.4 x 12.5 cm (transverse by AP by craniocaudal, as measured on axial series 6, image 229 and coronal series 9, image 93). Given the lobular morphology and smaller, separate minimally contacting oval masses at the superior aspect, a hematoma is favored over contiguous soft tissue masses. Partially visualized markedly high-grade sigmoid diverticulosis without definite inflammatory change. Surgical sutures overlie the midline anterior superior pelvis. There are high-grade atherosclerotic calcifications. IMPRESSION: 1. There is a 4.4 x 6.4 x 12.5 cm lobular soft tissue density mass within the lateral greater than medial aspect of the proximal left thigh. Given the lobular morphology and the small, only minimally contacting components superiorly, this is favored to represent a hematoma, however contiguous soft tissue masses can have a similar appearance. Recommend close clinical follow-up to ensure this suspected hematoma resolves clinically. 2. There is moderate subcutaneous fat edema and swelling throughout the visualized lower left anterior abdominal wall and the left thigh. 3. Partially visualized high-grade sigmoid diverticulosis without definite inflammatory change. 4. Severe tricompartmental osteoarthritis of the left knee. 5. Moderate to severe left hip osteoarthritis. Electronically Signed   By: Yvonne Kendall M.D.   On: 01/05/2023 12:54   ECHOCARDIOGRAM COMPLETE  Result Date: 01/01/2023    ECHOCARDIOGRAM REPORT   Patient Name:   SHALITHA BELLUS Date of Exam: 01/01/2023 Medical Rec #:  [id number]      Height:       62.0 in Accession #:    [id number]     Weight:       190.0 lb Date of Birth:   Jun 15, 1929      BSA:          1.870 m Patient Age:    89 years       BP:           117/41 mmHg Patient Gender: F              HR:           48 bpm. Exam Location:  Inpatient Procedure: 2D Echo, Cardiac Doppler and Color Doppler Indications:  elevated troponin  History:        Patient has prior history of Echocardiogram examinations. Risk                 Factors:Hypertension.  Sonographer:    Phineas Douglas Referring Phys: [place]  1. Left ventricular ejection fraction, by estimation, is 60 to 65%. The left ventricle has normal function. The left ventricle has no regional wall motion abnormalities. There is mild concentric left ventricular hypertrophy. Left ventricular diastolic function could not be evaluated.  2. Right ventricular systolic function is mildly reduced. The right ventricular size is moderately enlarged. There is moderately elevated pulmonary artery systolic pressure. The estimated right ventricular systolic pressure is 0000000 mmHg.  3. Left atrial size was severely dilated.  4. Right atrial size was severely dilated.  5. The mitral valve is normal in structure. Mild to moderate mitral valve regurgitation. No evidence of mitral stenosis.  6. Tricuspid valve regurgitation is moderate.  7. The aortic valve is tricuspid. There is mild calcification of the aortic valve. Aortic valve regurgitation is mild to moderate. Aortic valve sclerosis/calcification is present, without any evidence of aortic stenosis. Aortic regurgitation PHT measures 426 msec.  8. Aortic dilatation noted. There is borderline dilatation of the ascending aorta, measuring 37 mm.  9. The inferior vena cava is dilated in size with <50% respiratory variability, suggesting right atrial pressure of 15 mmHg. FINDINGS  Left Ventricle: Left ventricular ejection fraction, by estimation, is 60 to 65%. The left ventricle has normal function. The left ventricle has no regional wall motion abnormalities. The left ventricular  internal cavity size was normal in size. There is  mild concentric left ventricular hypertrophy. Left ventricular diastolic function could not be evaluated due to atrial fibrillation. Left ventricular diastolic function could not be evaluated. Right Ventricle: The right ventricular size is moderately enlarged. No increase in right ventricular wall thickness. Right ventricular systolic function is mildly reduced. There is moderately elevated pulmonary artery systolic pressure. The tricuspid regurgitant velocity is 2.74 m/s, and with an assumed right atrial pressure of 15 mmHg, the estimated right ventricular systolic pressure is 0000000 mmHg. Left Atrium: Left atrial size was severely dilated. Right Atrium: Right atrial size was severely dilated. Pericardium: There is no evidence of pericardial effusion. Mitral Valve: The mitral valve is normal in structure. Mild to moderate mitral valve regurgitation. No evidence of mitral valve stenosis. Tricuspid Valve: The tricuspid valve is normal in structure. Tricuspid valve regurgitation is moderate . No evidence of tricuspid stenosis. Aortic Valve: The aortic valve is tricuspid. There is mild calcification of the aortic valve. Aortic valve regurgitation is mild to moderate. Aortic regurgitation PHT measures 426 msec. Aortic valve sclerosis/calcification is present, without any evidence of aortic stenosis. Pulmonic Valve: The pulmonic valve was normal in structure. Pulmonic valve regurgitation is not visualized. No evidence of pulmonic stenosis. Aorta: Aortic dilatation noted. There is borderline dilatation of the ascending aorta, measuring 37 mm. Venous: The inferior vena cava is dilated in size with less than 50% respiratory variability, suggesting right atrial pressure of 15 mmHg. IAS/Shunts: No atrial level shunt detected by color flow Doppler. Additional Comments: There is pleural effusion in the left lateral region.  LEFT VENTRICLE PLAX 2D LVIDd:         3.80 cm      Diastology LVIDs:         2.30 cm     LV e' medial:    7.29 cm/s LV PW:  1.20 cm     LV E/e' medial:  16.3 LV IVS:        1.20 cm     LV e' lateral:   8.27 cm/s LVOT diam:     1.80 cm     LV E/e' lateral: 14.4 LV SV:         54 LV SV Index:   29 LVOT Area:     2.54 cm  LV Volumes (MOD) LV vol d, MOD A2C: 84.5 ml LV vol d, MOD A4C: 96.2 ml LV vol s, MOD A2C: 29.1 ml LV vol s, MOD A4C: 33.5 ml LV SV MOD A2C:     55.4 ml LV SV MOD A4C:     96.2 ml LV SV MOD BP:      61.8 ml RIGHT VENTRICLE             IVC RV Basal diam:  5.50 cm     IVC diam: 2.70 cm RV S prime:     10.70 cm/s TAPSE (M-mode): 2.1 cm LEFT ATRIUM              Index        RIGHT ATRIUM           Index LA diam:        4.10 cm  2.19 cm/m   RA Area:     34.20 cm LA Vol (A2C):   88.6 ml  47.37 ml/m  RA Volume:   119.00 ml 63.62 ml/m LA Vol (A4C):   101.0 ml 54.00 ml/m LA Biplane Vol: 96.5 ml  51.59 ml/m  AORTIC VALVE LVOT Vmax:   84.50 cm/s LVOT Vmean:  57.800 cm/s LVOT VTI:    0.213 m AI PHT:      426 msec  AORTA Ao Root diam: 3.40 cm Ao Asc diam:  3.70 cm MITRAL VALVE                TRICUSPID VALVE MV Area (PHT): 4.19 cm     TR Peak grad:   30.0 mmHg MV Decel Time: 181 msec     TR Vmax:        274.00 cm/s MR Peak grad: 80.3 mmHg MR Mean grad: 53.0 mmHg     SHUNTS MR Vmax:      448.00 cm/s   Systemic VTI:  0.21 m MR Vmean:     349.0 cm/s    Systemic Diam: 1.80 cm MV E velocity: 119.00 cm/s Glori Bickers MD Electronically signed by Glori Bickers MD Signature Date/Time: 01/01/2023/2:05:56 PM    Final    DG Chest Port 1 View  Result Date: 01/01/2023 CLINICAL DATA:  Weakness, losing strength and mobility slowly over several weeks now, sharper decline last week EXAM: PORTABLE CHEST 1 VIEW COMPARISON:  Portable exam [id number] hours compared to 07/16/2019 FINDINGS: Enlargement of cardiac silhouette with pulmonary vascular congestion. Atherosclerotic calcification aorta. BILATERAL interstitial infiltrates consistent with pulmonary edema and CHF.  LEFT pleural effusion and basilar atelectasis. No pneumothorax. Bones demineralized with advanced BILATERAL glenohumeral degenerative changes and associated calcified loose bodies. IMPRESSION: CHF with small LEFT pleural effusion. Electronically Signed   By: Lavonia Dana M.D.   On: 01/01/2023 09:49     The results of significant diagnostics from this hospitalization (including imaging, microbiology, ancillary and laboratory) are listed below for reference.     Microbiology: Recent Results (from the past 240 hour(s))  Resp panel by RT-PCR (RSV, Flu A&B, Covid) Anterior Nasal Swab     Status:  None   Collection Time: 01/01/23  9:23 AM   Specimen: Anterior Nasal Swab  Result Value Ref Range Status   SARS Coronavirus 2 by RT PCR NEGATIVE NEGATIVE Final    Comment: (NOTE) SARS-CoV-2 target nucleic acids are NOT DETECTED.  The SARS-CoV-2 RNA is generally detectable in upper respiratory specimens during the acute phase of infection. The lowest concentration of SARS-CoV-2 viral copies this assay can detect is 138 copies/mL. A negative result does not preclude SARS-Cov-2 infection and should not be used as the sole basis for treatment or other patient management decisions. A negative result may occur with  improper specimen collection/handling, submission of specimen other than nasopharyngeal swab, presence of viral mutation(s) within the areas targeted by this assay, and inadequate number of viral copies(<138 copies/mL). A negative result must be combined with clinical observations, patient history, and epidemiological information. The expected result is Negative.  Fact Sheet for Patients:  EntrepreneurPulse.com.au  Fact Sheet for Healthcare Providers:  IncredibleEmployment.be  This test is no t yet approved or cleared by the [place] FDA and  has been authorized for detection and/or diagnosis of SARS-CoV-2 by FDA under an Emergency Use Authorization  (EUA). This EUA will remain  in effect (meaning this test can be used) for the duration of the COVID-19 declaration under Section 564(b)(1) of the Act, 21 U.S.C.section 360bbb-3(b)(1), unless the authorization is terminated  or revoked sooner.       Influenza A by PCR NEGATIVE NEGATIVE Final   Influenza B by PCR NEGATIVE NEGATIVE Final    Comment: (NOTE) The Xpert Xpress SARS-CoV-2/FLU/RSV plus assay is intended as an aid in the diagnosis of influenza from Nasopharyngeal swab specimens and should not be used as a sole basis for treatment. Nasal washings and aspirates are unacceptable for Xpert Xpress SARS-CoV-2/FLU/RSV testing.  Fact Sheet for Patients: EntrepreneurPulse.com.au  Fact Sheet for Healthcare Providers: IncredibleEmployment.be  This test is not yet approved or cleared by the [place] FDA and has been authorized for detection and/or diagnosis of SARS-CoV-2 by FDA under an Emergency Use Authorization (EUA). This EUA will remain in effect (meaning this test can be used) for the duration of the COVID-19 declaration under Section 564(b)(1) of the Act, 21 U.S.C. section 360bbb-3(b)(1), unless the authorization is terminated or revoked.     Resp Syncytial Virus by PCR NEGATIVE NEGATIVE Final    Comment: (NOTE) Fact Sheet for Patients: EntrepreneurPulse.com.au  Fact Sheet for Healthcare Providers: IncredibleEmployment.be  This test is not yet approved or cleared by the [place] FDA and has been authorized for detection and/or diagnosis of SARS-CoV-2 by FDA under an Emergency Use Authorization (EUA). This EUA will remain in effect (meaning this test can be used) for the duration of the COVID-19 declaration under Section 564(b)(1) of the Act, 21 U.S.C. section 360bbb-3(b)(1), unless the authorization is terminated or revoked.  Performed at [facility], [place] [address] [place], [place] [address]      Labs: BNP (last 3 results) Recent Labs    01/01/23 0955 01/03/23 0504  BNP 1,209.2* [id number]*   Basic Metabolic Panel: Recent Labs  Lab 01/04/23 0513 01/05/23 0523 01/06/23 0537 01/07/23 0500 01/08/23 0452  NA 134* 137 134* 133* 134*  K 3.9 3.9 4.1 3.8 3.8  CL 92* 96* 94* 93* 94*  CO2 30 28 27 29 31   GLUCOSE 133* 170* 146* 132* 116*  BUN 43* 51* 54* 54* 53*  CREATININE 1.25* 1.22* 1.05* 1.12* 1.33*  CALCIUM 8.4* 8.4* 8.2* 8.1*  8.1*  MG  --  2.0 2.0 2.1 2.1   Liver Function Tests: Recent Labs  Lab 01/02/23 0058  AST 14*  ALT 13  ALKPHOS 47  BILITOT 0.7  PROT 5.6*  ALBUMIN 3.4*   No results for input(s): "LIPASE", "AMYLASE" in the last 168 hours. No results for input(s): "AMMONIA" in the last 168 hours. CBC: Recent Labs  Lab 01/04/23 0513 01/05/23 0523 01/06/23 0537 01/07/23 0500 01/08/23 0452  WBC 6.6 11.5* 10.9* 10.3 10.1  HGB 10.9* 10.6* 8.8* 9.3* 8.6*  HCT 33.3* 32.4* 27.3* 28.7* 25.6*  MCV 87.2 88.8 91.3 89.7 87.7  PLT 248 247 172 216 223   Cardiac Enzymes: No results for input(s): "CKTOTAL", "CKMB", "CKMBINDEX", "TROPONINI" in the last 168 hours. BNP: Invalid input(s): "POCBNP" CBG: Recent Labs  Lab 01/07/23 1116 01/07/23 1741 01/07/23 2103 01/08/23 0749 01/08/23 1134  GLUCAP 208* 180* 198* 139* 145*   D-Dimer No results for input(s): "DDIMER" in the last 72 hours. Hgb A1c No results for input(s): "HGBA1C" in the last 72 hours. Lipid Profile No results for input(s): "CHOL", "HDL", "LDLCALC", "TRIG", "CHOLHDL", "LDLDIRECT" in the last 72 hours. Thyroid function studies No results for input(s): "TSH", "T4TOTAL", "T3FREE", "THYROIDAB" in the last 72 hours.  Invalid input(s): "FREET3" Anemia work up No results for input(s): "VITAMINB12", "FOLATE", "FERRITIN", "TIBC", "IRON", "RETICCTPCT" in the last 72 hours. Urinalysis    Component Value Date/Time   COLORURINE YELLOW 01/01/2023 0923   APPEARANCEUR  CLEAR 01/01/2023 0923   LABSPEC 1.010 01/01/2023 0923   PHURINE 5.0 01/01/2023 0923   GLUCOSEU NEGATIVE 01/01/2023 0923   HGBUR NEGATIVE 01/01/2023 0923   BILIRUBINUR NEGATIVE 01/01/2023 0923   KETONESUR NEGATIVE 01/01/2023 0923   PROTEINUR NEGATIVE 01/01/2023 0923   UROBILINOGEN 0.2 03/22/2014 1944   NITRITE NEGATIVE 01/01/2023 0923   LEUKOCYTESUR NEGATIVE 01/01/2023 0923   Sepsis Labs Recent Labs  Lab 01/05/23 0523 01/06/23 0537 01/07/23 0500 01/08/23 0452  WBC 11.5* 10.9* 10.3 10.1   Microbiology Recent Results (from the past 240 hour(s))  Resp panel by RT-PCR (RSV, Flu A&B, Covid) Anterior Nasal Swab     Status: None   Collection Time: 01/01/23  9:23 AM   Specimen: Anterior Nasal Swab  Result Value Ref Range Status   SARS Coronavirus 2 by RT PCR NEGATIVE NEGATIVE Final    Comment: (NOTE) SARS-CoV-2 target nucleic acids are NOT DETECTED.  The SARS-CoV-2 RNA is generally detectable in upper respiratory specimens during the acute phase of infection. The lowest concentration of SARS-CoV-2 viral copies this assay can detect is 138 copies/mL. A negative result does not preclude SARS-Cov-2 infection and should not be used as the sole basis for treatment or other patient management decisions. A negative result may occur with  improper specimen collection/handling, submission of specimen other than nasopharyngeal swab, presence of viral mutation(s) within the areas targeted by this assay, and inadequate number of viral copies(<138 copies/mL). A negative result must be combined with clinical observations, patient history, and epidemiological information. The expected result is Negative.  Fact Sheet for Patients:  EntrepreneurPulse.com.au  Fact Sheet for Healthcare Providers:  IncredibleEmployment.be  This test is no t yet approved or cleared by the [place] FDA and  has been authorized for detection and/or diagnosis of SARS-CoV-2  by FDA under an Emergency Use Authorization (EUA). This EUA will remain  in effect (meaning this test can be used) for the duration of the COVID-19 declaration under Section 564(b)(1) of the Act, 21 U.S.C.section 360bbb-3(b)(1), unless the authorization is  terminated  or revoked sooner.       Influenza A by PCR NEGATIVE NEGATIVE Final   Influenza B by PCR NEGATIVE NEGATIVE Final    Comment: (NOTE) The Xpert Xpress SARS-CoV-2/FLU/RSV plus assay is intended as an aid in the diagnosis of influenza from Nasopharyngeal swab specimens and should not be used as a sole basis for treatment. Nasal washings and aspirates are unacceptable for Xpert Xpress SARS-CoV-2/FLU/RSV testing.  Fact Sheet for Patients: EntrepreneurPulse.com.au  Fact Sheet for Healthcare Providers: IncredibleEmployment.be  This test is not yet approved or cleared by the [place] FDA and has been authorized for detection and/or diagnosis of SARS-CoV-2 by FDA under an Emergency Use Authorization (EUA). This EUA will remain in effect (meaning this test can be used) for the duration of the COVID-19 declaration under Section 564(b)(1) of the Act, 21 U.S.C. section 360bbb-3(b)(1), unless the authorization is terminated or revoked.     Resp Syncytial Virus by PCR NEGATIVE NEGATIVE Final    Comment: (NOTE) Fact Sheet for Patients: EntrepreneurPulse.com.au  Fact Sheet for Healthcare Providers: IncredibleEmployment.be  This test is not yet approved or cleared by the [place] FDA and has been authorized for detection and/or diagnosis of SARS-CoV-2 by FDA under an Emergency Use Authorization (EUA). This EUA will remain in effect (meaning this test can be used) for the duration of the COVID-19 declaration under Section 564(b)(1) of the Act, 21 U.S.C. section 360bbb-3(b)(1), unless the authorization is terminated or revoked.  Performed at  [facility], [place] [address], [place], [place] [address]      Time coordinating discharge:  I have spent 35 minutes face to face with the patient and on the ward discussing the patients care, assessment, plan and disposition with other care givers. >50% of the time was devoted counseling the patient about the risks and benefits of treatment/Discharge disposition and coordinating care.   SIGNED:   Damita Lack, MD  [facility] 01/08/2023, 11:46 AM   If 7PM-7AM, please contact night-coverage

## 2023-01-08 NOTE — Progress Notes (Signed)
Rounding Note    Patient Name: Dawn Lawson Date of Encounter: 01/08/2023  [place] Cardiologist: Freada Bergeron, MD   Subjective  Net negative 1.2L yesterday, negative 4.9L on admission.  Cr 1.33.  BP 137/61.  Denies any chest pain or dyspnea  Inpatient Medications    Scheduled Meds:  cephALEXin  500 mg Oral BID   furosemide  40 mg Oral Daily   insulin aspart  0-9 Units Subcutaneous TID WC   rosuvastatin  5 mg Oral Q M,W,F   Continuous Infusions:  PRN Meds: acetaminophen **OR** acetaminophen, hydrALAZINE, ipratropium-albuterol, metoprolol tartrate, ondansetron **OR** ondansetron (ZOFRAN) IV, mouth rinse, senna-docusate, traZODone   Vital Signs    Vitals:   01/07/23 1312 01/07/23 2102 01/08/23 0428 01/08/23 0429  BP: (!) 125/53 (!) 136/47  137/61  Pulse: 76 89  78  Resp: 15 20  16   Temp: 98.5 F (36.9 C) 98.2 F (36.8 C)  98.4 F (36.9 C)  TempSrc: Oral Oral  Oral  SpO2: 96% 97%  100%  Weight:   84.7 kg   Height:        Intake/Output Summary (Last 24 hours) at 01/08/2023 1133 Last data filed at 01/08/2023 0920 Gross per 24 hour  Intake 1380 ml  Output 2350 ml  Net -970 ml       01/08/2023    4:28 AM 01/07/2023    5:15 AM 01/06/2023    6:31 AM  Last 3 Weights  Weight (lbs) 186 lb 11.2 oz 171 lb 8.3 oz 172 lb 1.6 oz  Weight (kg) 84.687 kg 77.8 kg 78.064 kg      Telemetry    Atrial fibrillation, HR in the 70s-80s - Personally Reviewed  ECG    No new tracings since 3/11 - Personally Reviewed  Physical Exam   GEN: Elderly female, sitting upright in the bed. No acute distress  Neck: No JVD Cardiac: Irregular rate and rhythm, radial pulses 2+ bilaterally  Respiratory: Clear to auscultation bilaterally. Normal WOB on 2 L supplemental oxygen via [place]  GI: Soft, nontender, non-distended  MS: No edema in BLE; No deformity. Bilateral lower extremities are tender to palpation  Neuro:  Nonfocal  Psych: Normal affect   Labs    High  Sensitivity Troponin:   Recent Labs  Lab 01/01/23 0955 01/01/23 1231  TROPONINIHS 554* 506*      Chemistry Recent Labs  Lab 01/02/23 0058 01/03/23 0504 01/06/23 0537 01/07/23 0500 01/08/23 0452  NA 128*   < > 134* 133* 134*  K 4.4   < > 4.1 3.8 3.8  CL 95*   < > 94* 93* 94*  CO2 25   < > 27 29 31   GLUCOSE 94   < > 146* 132* 116*  BUN 46*   < > 54* 54* 53*  CREATININE 1.20*   < > 1.05* 1.12* 1.33*  CALCIUM 8.3*   < > 8.2* 8.1* 8.1*  MG  --    < > 2.0 2.1 2.1  PROT 5.6*  --   --   --   --   ALBUMIN 3.4*  --   --   --   --   AST 14*  --   --   --   --   ALT 13  --   --   --   --   ALKPHOS 47  --   --   --   --   BILITOT 0.7  --   --   --   --  GFRNONAA 43*   < > 50* 46* 38*  ANIONGAP 8   < > 13 11 9    < > = values in this interval not displayed.     Lipids  Recent Labs  Lab 01/03/23 0504  CHOL 86  TRIG 52  HDL 32*  LDLCALC 44  CHOLHDL 2.7     Hematology Recent Labs  Lab 01/06/23 0537 01/07/23 0500 01/08/23 0452  WBC 10.9* 10.3 10.1  RBC 2.99* 3.20* 2.92*  HGB 8.8* 9.3* 8.6*  HCT 27.3* 28.7* 25.6*  MCV 91.3 89.7 87.7  MCH 29.4 29.1 29.5  MCHC 32.2 32.4 33.6  RDW 16.1* 15.9* 16.0*  PLT 172 216 223    Thyroid  Recent Labs  Lab 01/01/23 1645  TSH 1.448     BNP Recent Labs  Lab 01/03/23 0504  BNP 518.5*     DDimer No results for input(s): "DDIMER" in the last 168 hours.   Radiology    No results found.  Cardiac Studies   Echocardiogram 01/01/2023  1. Left ventricular ejection fraction, by estimation, is 60 to 65%. The  left ventricle has normal function. The left ventricle has no regional  wall motion abnormalities. There is mild concentric left ventricular  hypertrophy. Left ventricular diastolic  function could not be evaluated.   2. Right ventricular systolic function is mildly reduced. The right  ventricular size is moderately enlarged. There is moderately elevated  pulmonary artery systolic pressure. The estimated right  ventricular  systolic pressure is 0000000 mmHg.   3. Left atrial size was severely dilated.   4. Right atrial size was severely dilated.   5. The mitral valve is normal in structure. Mild to moderate mitral valve  regurgitation. No evidence of mitral stenosis.   6. Tricuspid valve regurgitation is moderate.   7. The aortic valve is tricuspid. There is mild calcification of the  aortic valve. Aortic valve regurgitation is mild to moderate. Aortic valve  sclerosis/calcification is present, without any evidence of aortic  stenosis. Aortic regurgitation PHT  measures 426 msec.   8. Aortic dilatation noted. There is borderline dilatation of the  ascending aorta, measuring 37 mm.   9. The inferior vena cava is dilated in size with <50% respiratory  variability, suggesting right atrial pressure of 15 mmHg.   Patient Profile     89 y.o. female  with prediabetes, hypertension who was admitted on 01/01/2023 for acute hypoxic respiratory failure secondary to acute  diastolic heart failure, now on oral diuretic; now with ongoing hematoma    Assessment & Plan   Acute on chronic diastolic heart failure RV dysfunction Acute hypoxic respiratory failure - Patient was brought into the ED on 3/11 after the family noticed that she had been having increased weakness and decreased mobility for the past several weeks now.  Weakness had worsened significantly over the past week.  Also complained of shortness of breath - Initial BNP on 3/11 elevated to 1209.  Chest x-ray 3/11 showed CHF with small left pleural effusion. BNP 3/13 down to 518.5 - Echocardiogram this admission showed EF 60-65% with no regional wall motion abnormalities, mildly reduced RV systolic function - Patient has been diuresing on IV Lasix.  Now transitioned to PO lasix 40 mg daily   Elevated troponin Demand ischemia - High sensitive troponin 554, 506 on admission -Patient is without chest pain. Suspect this is demand ischemia in the setting  of decompensated heart failure.  Echocardiogram showed EF 60 to 65% -Continue 5 mg Crestor  on MWF     Paroxysmal Atrial fibrillation - Appears to be a new diagnosis this admission ---> holding eliquis with inner thigh hematoma - Risk benefit of stroke/bleeding was discussed by Dr. Marlou Porch with the family: "Eliquis has been stopped appropriately.  We will avoid anticoagulation given her easy bleeding properties.  I would not even be opposed to her staying off of low-dose aspirin as well.  Did discuss with the family that without anticoagulation, there is an increased risk for stroke.  However risk of bleeding outweighs the risk at this point. " - HR well controlled, no need for AV nodal blocking medications. - unable to restore sinus rhythm with chemical or electrical conversion given no [facility]   [facility] will sign off.   Medication Recommendations:  lasix 40 mg daily Other recommendations (labs, testing, etc):  BMET within 1 week Follow up as an outpatient:  Will schedule   For questions or updates, please contact [place] Please consult [email or web address] for contact info under     Signed, Donato Heinz, MD  01/08/2023, 11:33 AM

## 2023-01-08 NOTE — TOC Transition Note (Addendum)
Transition of Care [facility]) - CM/SW Discharge Note   Patient Details  Name: Dawn Lawson MRN: [id number] Date of Birth: 10/07/1929  [facility] CM/SW Contact:  Dessa Phi, RN Phone Number: 01/08/2023, 11:03 AM   Clinical Narrative:  [facility] can accept rep K[facility]. Confirm if 02 needed [facility]-MD notified.if stable awaiting d/c summary,rm#,report #,then PTAR  -12:18p-going to [facility] rm#[id number],report tel#[phone].PTAR called. No further CM needs.      Final next level of care: Skilled Nursing Facility Barriers to Discharge: No Barriers Identified   Patient Goals and CMS Choice      Discharge Placement PASRR number recieved: 01/04/23 PASRR number recieved: 01/04/23            Patient [facility] bed at: [facility] Patient to be transferred to facility by:  Corey [facility]) Name of family member notified:  ([name](son)) Patient and family notified of of transfer: 01/08/23  Discharge Plan and Services Additional resources added to the After Visit Summary for                                       Social Determinants of Health (SDOH) Interventions SDOH Screenings   Food Insecurity: No Food Insecurity (01/01/2023)  Housing: Low Risk  (01/01/2023)  Transportation Needs: No Transportation Needs (01/01/2023)  Utilities: Not At Risk (01/01/2023)  Alcohol Screen: Low Risk  (10/12/2022)  Depression (PHQ2-9): Low Risk  (11/27/2022)  Financial Resource Strain: Low Risk  (10/12/2022)  Physical Activity: Sufficiently Active (10/12/2022)  Social Connections: Socially Isolated (10/12/2022)  Stress: No Stress Concern Present (10/12/2022)  Tobacco Use: Medium Risk (01/01/2023)     Readmission Risk Interventions     No data to display

## 2023-01-09 DIAGNOSIS — I4891 Unspecified atrial fibrillation: Secondary | ICD-10-CM | POA: Diagnosis not present

## 2023-01-09 DIAGNOSIS — I5033 Acute on chronic diastolic (congestive) heart failure: Secondary | ICD-10-CM | POA: Diagnosis not present

## 2023-01-09 DIAGNOSIS — I10 Essential (primary) hypertension: Secondary | ICD-10-CM | POA: Diagnosis not present

## 2023-01-09 DIAGNOSIS — R531 Weakness: Secondary | ICD-10-CM | POA: Diagnosis not present

## 2023-01-09 NOTE — Progress Notes (Deleted)
Cardiology Office Note:    Date:  01/09/2023   ID:  Charlann Noss, DOB 1928/10/30, MRN [id number]  PCP:  [name], NP  [place] [facility] Cardiologist:  Freada Bergeron, MD { Click to update primary MD,subspecialty MD or APP then REFRESH:1}    Referring MD: [name], NP   Chief Complaint:  No chief complaint on file. {Click here for Visit Info    :1}    History of Present Illness:   Dawn Lawson is a 89 y.o. female with   history of hypertension, prediabetes, grade 2 DD, prior bacteremia without endocarditis.  Patient was admitted with acute CHF and new onset atrial fibrillation with slow ventricular rate which was new.  2D echo 01/01/2023 showed normal LVEF 60 to 65% with mildly reduced RV systolic function, severely dilated LA and RA mild to moderate MR.  She was diuresed, elevated troponins of 500 felt secondary to demand ischemia.  Patient was initially started on Eliquis for the A-fib but developed a thigh hematoma and this was stopped with no plans for further anticoagulation.      Past Medical History:  Diagnosis Date   AKI (acute kidney injury) ([place]) 03/22/2014   Anemia    Arthritis    ATN (acute tubular necrosis) ([place]) 03/22/2014   Cataract    Heart murmur    Hypertension    Lactic acidosis 03/22/2014   PONV (postoperative nausea and vomiting)    Pre-diabetes    Sepsis due to group B Streptococcus ([place]) 04/14/2014   Severe sepsis with acute organ dysfunction ([place]) 03/22/2014   Status post PICC central line placement 04/14/2014   Urinary tract infection 03/22/2014   Current Medications: No outpatient medications have been marked as taking for the 01/23/23 encounter (Appointment) with Imogene Burn, PA-C.    Allergies:   Penicillins, Chocolate, Tape, and Ampicillin   Social History   Tobacco Use   Smoking status: Former    Packs/day: 1.00    Years: 2.00    Additional pack years: 0.00    Total pack years: 2.00    Types:  Cigarettes    Quit date: 10/23/1976    Years since quitting: 46.2   Smokeless tobacco: Never  Vaping Use   Vaping Use: Never used  Substance Use Topics   Alcohol use: No    Alcohol/week: 0.0 standard drinks of alcohol   Drug use: No    Family Hx: The patient's family history includes Cancer in her mother; Heart attack in her father; Heart disease in her mother.  ROS     Physical Exam:    VS:  There were no vitals taken for this visit.    Wt Readings from Last 3 Encounters:  01/08/23 186 lb 11.2 oz (84.7 kg)  11/27/22 190 lb (86.2 kg)  10/12/22 190 lb (86.2 kg)    Physical Exam  GEN: Well nourished, well developed, in no acute distress  HEENT: normal  Neck: no JVD, carotid bruits, or masses Cardiac:RRR; no murmurs, rubs, or gallops  Respiratory:  clear to auscultation bilaterally, normal work of breathing GI: soft, nontender, nondistended, + BS Ext: without cyanosis, clubbing, or edema, Good distal pulses bilaterally MS: no deformity or atrophy  Skin: warm and dry, no rash Neuro:  Alert and Oriented x 3, Strength and sensation are intact Psych: euthymic mood, full affect        EKGs/Labs/Other Test Reviewed:    EKG:  EKG is *** ordered today.  The  ekg ordered today demonstrates ***  Recent Labs: 01/01/2023: TSH 1.448 01/02/2023: ALT 13 01/03/2023: B Natriuretic Peptide 518.5 01/08/2023: BUN 53; Creatinine, Ser 1.33; Hemoglobin 8.6; Magnesium 2.1; Platelets 223; Potassium 3.8; Sodium 134   Recent Lipid Panel Recent Labs    01/03/23 0504  CHOL 86  TRIG 52  HDL 32*  VLDL 10  LDLCALC 44     Prior CV Studies: {Select studies to display:26339}  Echocardiogram 01/01/2023  1. Left ventricular ejection fraction, by estimation, is 60 to 65%. The  left ventricle has normal function. The left ventricle has no regional  wall motion abnormalities. There is mild concentric left ventricular  hypertrophy. Left ventricular diastolic  function could not be evaluated.   2.  Right ventricular systolic function is mildly reduced. The right  ventricular size is moderately enlarged. There is moderately elevated  pulmonary artery systolic pressure. The estimated right ventricular  systolic pressure is 0000000 mmHg.   3. Left atrial size was severely dilated.   4. Right atrial size was severely dilated.   5. The mitral valve is normal in structure. Mild to moderate mitral valve  regurgitation. No evidence of mitral stenosis.   6. Tricuspid valve regurgitation is moderate.   7. The aortic valve is tricuspid. There is mild calcification of the  aortic valve. Aortic valve regurgitation is mild to moderate. Aortic valve  sclerosis/calcification is present, without any evidence of aortic  stenosis. Aortic regurgitation PHT  measures 426 msec.   8. Aortic dilatation noted. There is borderline dilatation of the  ascending aorta, measuring 37 mm.   9. The inferior vena cava is dilated in size with <50% respiratory  variability, suggesting right atrial pressure of 15 mmHg.      Risk Assessment/Calculations/Metrics:   {Does this patient have ATRIAL FIBRILLATION?[phone]}          ASSESSMENT & PLAN:   No problem-specific Assessment & Plan notes found for this encounter.   Chronic diastolic CHF echo [id number] LVEF 60 to 65% no wall motion abnormality mild reduced RV systolic function  PAF new Diagnosis this admission developed inner thigh hematoma so Eliquis was stopped with plans to avoid anticoagulation given her easy bleeding properties.  Dr. Nechama Guard recommended staying off low-dose aspirin as well.  Hypertension  Prediabetes        {Are you ordering a CV Procedure (e.g. stress test, cath, DCCV, TEE, etc)?   Press F2        :[id number]   Dispo:  No follow-ups on file.   Medication Adjustments/Labs and Tests Ordered: Current medicines are reviewed at length with the patient today.  Concerns regarding medicines are outlined above.  Tests Ordered: No orders of  the defined types were placed in this encounter.  Medication Changes: No orders of the defined types were placed in this encounter.  Sumner Boast, PA-C  01/09/2023 9:41 AM    [facility] [place], [place], [place]  [address] Phone: [phone]; Fax: [phone]

## 2023-01-10 DIAGNOSIS — R1312 Dysphagia, oropharyngeal phase: Secondary | ICD-10-CM | POA: Diagnosis not present

## 2023-01-11 DIAGNOSIS — E669 Obesity, unspecified: Secondary | ICD-10-CM | POA: Diagnosis not present

## 2023-01-11 DIAGNOSIS — E785 Hyperlipidemia, unspecified: Secondary | ICD-10-CM | POA: Diagnosis not present

## 2023-01-11 DIAGNOSIS — L89152 Pressure ulcer of sacral region, stage 2: Secondary | ICD-10-CM | POA: Diagnosis not present

## 2023-01-11 DIAGNOSIS — I10 Essential (primary) hypertension: Secondary | ICD-10-CM | POA: Diagnosis not present

## 2023-01-12 DIAGNOSIS — I10 Essential (primary) hypertension: Secondary | ICD-10-CM | POA: Diagnosis not present

## 2023-01-12 DIAGNOSIS — R1312 Dysphagia, oropharyngeal phase: Secondary | ICD-10-CM | POA: Diagnosis not present

## 2023-01-12 DIAGNOSIS — I5033 Acute on chronic diastolic (congestive) heart failure: Secondary | ICD-10-CM | POA: Diagnosis not present

## 2023-01-12 DIAGNOSIS — R531 Weakness: Secondary | ICD-10-CM | POA: Diagnosis not present

## 2023-01-12 DIAGNOSIS — I4891 Unspecified atrial fibrillation: Secondary | ICD-10-CM | POA: Diagnosis not present

## 2023-01-15 DIAGNOSIS — D649 Anemia, unspecified: Secondary | ICD-10-CM | POA: Diagnosis not present

## 2023-01-15 DIAGNOSIS — E860 Dehydration: Secondary | ICD-10-CM | POA: Diagnosis not present

## 2023-01-15 DIAGNOSIS — E669 Obesity, unspecified: Secondary | ICD-10-CM | POA: Diagnosis not present

## 2023-01-15 DIAGNOSIS — L89152 Pressure ulcer of sacral region, stage 2: Secondary | ICD-10-CM | POA: Diagnosis not present

## 2023-01-15 DIAGNOSIS — I10 Essential (primary) hypertension: Secondary | ICD-10-CM | POA: Diagnosis not present

## 2023-01-15 DIAGNOSIS — E785 Hyperlipidemia, unspecified: Secondary | ICD-10-CM | POA: Diagnosis not present

## 2023-01-15 DIAGNOSIS — J9620 Acute and chronic respiratory failure, unspecified whether with hypoxia or hypercapnia: Secondary | ICD-10-CM | POA: Diagnosis not present

## 2023-01-16 ENCOUNTER — Other Ambulatory Visit: Payer: Self-pay

## 2023-01-16 ENCOUNTER — Inpatient Hospital Stay (HOSPITAL_COMMUNITY)
Admission: EM | Admit: 2023-01-16 | Discharge: 2023-01-22 | DRG: 871 | Disposition: A | Discharge status: Hospice | Payer: Medicare Other | Source: Skilled Nursing Facility | Attending: Internal Medicine | Admitting: Internal Medicine

## 2023-01-16 ENCOUNTER — Emergency Department (HOSPITAL_COMMUNITY): Payer: Medicare Other

## 2023-01-16 DIAGNOSIS — Z66 Do not resuscitate: Secondary | ICD-10-CM | POA: Diagnosis present

## 2023-01-16 DIAGNOSIS — I5033 Acute on chronic diastolic (congestive) heart failure: Secondary | ICD-10-CM | POA: Diagnosis present

## 2023-01-16 DIAGNOSIS — Z515 Encounter for palliative care: Secondary | ICD-10-CM | POA: Diagnosis not present

## 2023-01-16 DIAGNOSIS — Z96651 Presence of right artificial knee joint: Secondary | ICD-10-CM | POA: Diagnosis not present

## 2023-01-16 DIAGNOSIS — J690 Pneumonitis due to inhalation of food and vomit: Secondary | ICD-10-CM | POA: Diagnosis present

## 2023-01-16 DIAGNOSIS — J189 Pneumonia, unspecified organism: Secondary | ICD-10-CM | POA: Diagnosis not present

## 2023-01-16 DIAGNOSIS — D631 Anemia in chronic kidney disease: Secondary | ICD-10-CM | POA: Diagnosis present

## 2023-01-16 DIAGNOSIS — J90 Pleural effusion, not elsewhere classified: Secondary | ICD-10-CM | POA: Diagnosis not present

## 2023-01-16 DIAGNOSIS — Z90712 Acquired absence of cervix with remaining uterus: Secondary | ICD-10-CM

## 2023-01-16 DIAGNOSIS — Z6834 Body mass index (BMI) 34.0-34.9, adult: Secondary | ICD-10-CM | POA: Diagnosis not present

## 2023-01-16 DIAGNOSIS — E785 Hyperlipidemia, unspecified: Secondary | ICD-10-CM | POA: Diagnosis not present

## 2023-01-16 DIAGNOSIS — R0602 Shortness of breath: Secondary | ICD-10-CM | POA: Diagnosis not present

## 2023-01-16 DIAGNOSIS — S7012XD Contusion of left thigh, subsequent encounter: Secondary | ICD-10-CM

## 2023-01-16 DIAGNOSIS — I110 Hypertensive heart disease with heart failure: Secondary | ICD-10-CM | POA: Diagnosis not present

## 2023-01-16 DIAGNOSIS — X58XXXD Exposure to other specified factors, subsequent encounter: Secondary | ICD-10-CM | POA: Diagnosis present

## 2023-01-16 DIAGNOSIS — G9341 Metabolic encephalopathy: Secondary | ICD-10-CM | POA: Diagnosis present

## 2023-01-16 DIAGNOSIS — A419 Sepsis, unspecified organism: Principal | ICD-10-CM | POA: Diagnosis present

## 2023-01-16 DIAGNOSIS — R6520 Severe sepsis without septic shock: Secondary | ICD-10-CM | POA: Diagnosis present

## 2023-01-16 DIAGNOSIS — I130 Hypertensive heart and chronic kidney disease with heart failure and stage 1 through stage 4 chronic kidney disease, or unspecified chronic kidney disease: Secondary | ICD-10-CM | POA: Diagnosis present

## 2023-01-16 DIAGNOSIS — M79605 Pain in left leg: Secondary | ICD-10-CM | POA: Diagnosis present

## 2023-01-16 DIAGNOSIS — I4821 Permanent atrial fibrillation: Secondary | ICD-10-CM | POA: Diagnosis present

## 2023-01-16 DIAGNOSIS — I482 Chronic atrial fibrillation, unspecified: Secondary | ICD-10-CM | POA: Diagnosis not present

## 2023-01-16 DIAGNOSIS — Z79899 Other long term (current) drug therapy: Secondary | ICD-10-CM

## 2023-01-16 DIAGNOSIS — I959 Hypotension, unspecified: Secondary | ICD-10-CM | POA: Diagnosis not present

## 2023-01-16 DIAGNOSIS — E669 Obesity, unspecified: Secondary | ICD-10-CM | POA: Diagnosis present

## 2023-01-16 DIAGNOSIS — Z8249 Family history of ischemic heart disease and other diseases of the circulatory system: Secondary | ICD-10-CM | POA: Diagnosis not present

## 2023-01-16 DIAGNOSIS — R233 Spontaneous ecchymoses: Secondary | ICD-10-CM | POA: Diagnosis not present

## 2023-01-16 DIAGNOSIS — R627 Adult failure to thrive: Secondary | ICD-10-CM | POA: Diagnosis present

## 2023-01-16 DIAGNOSIS — R9431 Abnormal electrocardiogram [ECG] [EKG]: Secondary | ICD-10-CM | POA: Diagnosis not present

## 2023-01-16 DIAGNOSIS — I509 Heart failure, unspecified: Secondary | ICD-10-CM | POA: Diagnosis not present

## 2023-01-16 DIAGNOSIS — N1832 Chronic kidney disease, stage 3b: Secondary | ICD-10-CM | POA: Diagnosis not present

## 2023-01-16 DIAGNOSIS — J9621 Acute and chronic respiratory failure with hypoxia: Secondary | ICD-10-CM | POA: Diagnosis present

## 2023-01-16 DIAGNOSIS — R5383 Other fatigue: Secondary | ICD-10-CM | POA: Diagnosis not present

## 2023-01-16 DIAGNOSIS — M7989 Other specified soft tissue disorders: Secondary | ICD-10-CM | POA: Diagnosis not present

## 2023-01-16 DIAGNOSIS — M1990 Unspecified osteoarthritis, unspecified site: Secondary | ICD-10-CM | POA: Diagnosis present

## 2023-01-16 DIAGNOSIS — Z961 Presence of intraocular lens: Secondary | ICD-10-CM | POA: Diagnosis present

## 2023-01-16 DIAGNOSIS — Z87891 Personal history of nicotine dependence: Secondary | ICD-10-CM | POA: Diagnosis not present

## 2023-01-16 DIAGNOSIS — R54 Age-related physical debility: Secondary | ICD-10-CM | POA: Diagnosis not present

## 2023-01-16 DIAGNOSIS — R059 Cough, unspecified: Secondary | ICD-10-CM | POA: Diagnosis not present

## 2023-01-16 DIAGNOSIS — E119 Type 2 diabetes mellitus without complications: Secondary | ICD-10-CM | POA: Diagnosis not present

## 2023-01-16 DIAGNOSIS — R7303 Prediabetes: Secondary | ICD-10-CM | POA: Diagnosis not present

## 2023-01-16 DIAGNOSIS — N189 Chronic kidney disease, unspecified: Secondary | ICD-10-CM | POA: Diagnosis not present

## 2023-01-16 DIAGNOSIS — R739 Hyperglycemia, unspecified: Secondary | ICD-10-CM | POA: Diagnosis present

## 2023-01-16 DIAGNOSIS — R0902 Hypoxemia: Secondary | ICD-10-CM | POA: Diagnosis not present

## 2023-01-16 LAB — URINALYSIS, ROUTINE W REFLEX MICROSCOPIC
Bilirubin Urine: NEGATIVE
Glucose, UA: NEGATIVE mg/dL
Ketones, ur: NEGATIVE mg/dL
Nitrite: NEGATIVE
Protein, ur: 30 mg/dL — AB
Specific Gravity, Urine: 1.017 (ref 1.005–1.030)
pH: 5 (ref 5.0–8.0)

## 2023-01-16 LAB — COMPREHENSIVE METABOLIC PANEL
ALT: 22 U/L (ref 0–44)
AST: 20 U/L (ref 15–41)
Albumin: 3.1 g/dL — ABNORMAL LOW (ref 3.5–5.0)
Alkaline Phosphatase: 72 U/L (ref 38–126)
Anion gap: 13 (ref 5–15)
BUN: 44 mg/dL — ABNORMAL HIGH (ref 8–23)
CO2: 30 mmol/L (ref 22–32)
Calcium: 8.7 mg/dL — ABNORMAL LOW (ref 8.9–10.3)
Chloride: 95 mmol/L — ABNORMAL LOW (ref 98–111)
Creatinine, Ser: 1.18 mg/dL — ABNORMAL HIGH (ref 0.44–1.00)
GFR, Estimated: 44 mL/min — ABNORMAL LOW (ref >60)
Glucose, Bld: 155 mg/dL — ABNORMAL HIGH (ref 70–99)
Potassium: 4 mmol/L (ref 3.5–5.1)
Sodium: 138 mmol/L (ref 135–145)
Total Bilirubin: 1.3 mg/dL — ABNORMAL HIGH (ref 0.3–1.2)
Total Protein: 6.2 g/dL — ABNORMAL LOW (ref 6.5–8.1)

## 2023-01-16 LAB — I-STAT VENOUS BLOOD GAS, ED
Acid-Base Excess: 8 mmol/L — ABNORMAL HIGH (ref 0.0–2.0)
Bicarbonate: 34.1 mmol/L — ABNORMAL HIGH (ref 20.0–28.0)
Calcium, Ion: 1.1 mmol/L — ABNORMAL LOW (ref 1.15–1.40)
HCT: 30 % — ABNORMAL LOW (ref 36.0–46.0)
Hemoglobin: 10.2 g/dL — ABNORMAL LOW (ref 12.0–15.0)
O2 Saturation: 85 %
Potassium: 4.1 mmol/L (ref 3.5–5.1)
Sodium: 135 mmol/L (ref 135–145)
TCO2: 36 mmol/L — ABNORMAL HIGH (ref 22–32)
pCO2, Ven: 53.2 mmHg (ref 44–60)
pH, Ven: 7.415 (ref 7.25–7.43)
pO2, Ven: 51 mmHg — ABNORMAL HIGH (ref 32–45)

## 2023-01-16 LAB — CBC WITH DIFFERENTIAL/PLATELET
Abs Immature Granulocytes: 0.08 10*3/uL — ABNORMAL HIGH (ref 0.00–0.07)
Basophils Absolute: 0.1 10*3/uL (ref 0.0–0.1)
Basophils Relative: 1 %
Eosinophils Absolute: 0.3 10*3/uL (ref 0.0–0.5)
Eosinophils Relative: 2 %
HCT: 29.9 % — ABNORMAL LOW (ref 36.0–46.0)
Hemoglobin: 9.3 g/dL — ABNORMAL LOW (ref 12.0–15.0)
Immature Granulocytes: 1 %
Lymphocytes Relative: 5 %
Lymphs Abs: 0.6 10*3/uL — ABNORMAL LOW (ref 0.7–4.0)
MCH: 28.6 pg (ref 26.0–34.0)
MCHC: 31.1 g/dL (ref 30.0–36.0)
MCV: 92 fL (ref 80.0–100.0)
Monocytes Absolute: 0.9 10*3/uL (ref 0.1–1.0)
Monocytes Relative: 7 %
Neutro Abs: 11.2 10*3/uL — ABNORMAL HIGH (ref 1.7–7.7)
Neutrophils Relative %: 84 %
Platelets: 317 10*3/uL (ref 150–400)
RBC: 3.25 MIL/uL — ABNORMAL LOW (ref 3.87–5.11)
RDW: 15.6 % — ABNORMAL HIGH (ref 11.5–15.5)
WBC: 13.1 10*3/uL — ABNORMAL HIGH (ref 4.0–10.5)
nRBC: 0 % (ref 0.0–0.2)

## 2023-01-16 LAB — LACTIC ACID, PLASMA: Lactic Acid, Venous: 1 mmol/L (ref 0.5–1.9)

## 2023-01-16 LAB — PROTIME-INR
INR: 1.2 (ref 0.8–1.2)
Prothrombin Time: 14.9 seconds (ref 11.4–15.2)

## 2023-01-16 LAB — APTT: aPTT: 25 seconds (ref 24–36)

## 2023-01-16 MED ORDER — SODIUM CHLORIDE 0.9 % IV SOLN
500.0000 mg | Freq: Once | INTRAVENOUS | Status: AC
  Administered 2023-01-16: 500 mg via INTRAVENOUS
  Filled 2023-01-16: qty 5

## 2023-01-16 MED ORDER — SODIUM CHLORIDE 0.9 % IV SOLN
500.0000 mg | INTRAVENOUS | Status: DC
  Administered 2023-01-17: 500 mg via INTRAVENOUS
  Filled 2023-01-16: qty 5

## 2023-01-16 MED ORDER — SODIUM CHLORIDE 0.9 % IV SOLN
2.0000 g | INTRAVENOUS | Status: AC
  Administered 2023-01-17 – 2023-01-21 (×5): 2 g via INTRAVENOUS
  Filled 2023-01-16 (×5): qty 20

## 2023-01-16 MED ORDER — ONDANSETRON HCL 4 MG/2ML IJ SOLN
4.0000 mg | Freq: Once | INTRAMUSCULAR | Status: AC
  Administered 2023-01-16: 4 mg via INTRAVENOUS
  Filled 2023-01-16: qty 2

## 2023-01-16 MED ORDER — SODIUM CHLORIDE 0.9 % IV SOLN
1.0000 g | Freq: Once | INTRAVENOUS | Status: AC
  Administered 2023-01-16: 1 g via INTRAVENOUS
  Filled 2023-01-16: qty 10

## 2023-01-16 NOTE — ED Notes (Signed)
Pt's noted to have spo2 80%-85%. RT at bedside. Venturi mask and NRB tried without improvement. RT placing patient on bipap. MD notified.

## 2023-01-16 NOTE — ED Triage Notes (Signed)
Pt BIB EMS due to [facility]. Pt diagnoiswed w pna today, started rocephi\n, last dose at 1100. Pt received duoneb, 2g mag, 125 solumedrol. Pts room air 47%. Pt arrives on non- rebreather.

## 2023-01-16 NOTE — H&P (Incomplete)
History and Physical  Dawn Lawson [id number] DOB: 1929/06/02 DOA: 01/16/2023  Referring physician: Dr. Tyrone Nine, [place]. PCP: [name], NP  Outpatient Specialists: Orthopedic surgery, infectious disease. Patient coming from: Home.  Chief Complaint: Acute respiratory distress.  HPI: Dawn Lawson is a 89 y.o. female with medical history significant for prediabetes, hyperlipidemia, hypertension, physical debility, obesity, arthritis, HFpEF with grade 2 diastolic dysfunction, chronic bilateral lower extremity edema, recently admitted and diagnosed with acute CHF and new onset A-fib, not on oral anticoagulation due to left thigh hematoma with ecchymosis noted, who presented to [facility] ED via EMS with acute respiratory distress.    Associated with progressive cough.  Nonrebreather was applied.  Upon arrival to the ED was initially placed on 5 to 6 L nasal cannula without significant improvement therefore was placed on BiPAP.  At the time of this visit the patient is somnolent and on BiPAP.  Volume overload on exam.  Personally reviewed chest x-ray done which shows moderate left pleural effusion with concern for possible left lower lobe infiltrates.  Due to concern for The patient was started on empiric IV antibiotics by EDP.  TRH, hospitalist service, was asked to admit.  ED Course:   Review of Systems: Review of systems as noted in the HPI. All other systems reviewed and are negative.   Past Medical History:  Diagnosis Date  . AKI (acute kidney injury) ([place]) 03/22/2014  . Anemia   . Arthritis   . ATN (acute tubular necrosis) ([place]) 03/22/2014  . Cataract   . Heart murmur   . Hypertension   . Lactic acidosis 03/22/2014  . PONV (postoperative nausea and vomiting)   . Pre-diabetes   . Sepsis due to group B Streptococcus ([place]) 04/14/2014  . Severe sepsis with acute organ dysfunction ([place]) 03/22/2014  . Status post PICC central line placement 04/14/2014  . Urinary tract infection  03/22/2014   Past Surgical History:  Procedure Laterality Date  . ABDOMINAL HYSTERECTOMY     partial  . APPENDECTOMY    . APPLICATION OF WOUND VAC Right 09/18/2016   Procedure: APPLICATION OF WOUND VAC;  Surgeon: Rod Can, MD;  Location: [place];  Service: Orthopedics;  Laterality: Right;  . EXCISIONAL TOTAL KNEE ARTHROPLASTY WITH ANTIBIOTIC SPACERS Right 09/18/2016   Procedure: RESECTION OF RIGHT TOTAL KNEE ARTHROPLASTY PLACEMENT OF ANTIBIOTIC SPACER;  Surgeon: Rod Can, MD;  Location: [place];  Service: Orthopedics;  Laterality: Right;  . EYE SURGERY     Lens implants  . JOINT REPLACEMENT Right    KNEE  . KNEE HARDWARE REMOVAL Right 11/24/2016  . RECTOCELE REPAIR    . TEE WITHOUT CARDIOVERSION N/A 04/09/2014   Procedure: TRANSESOPHAGEAL ECHOCARDIOGRAM (TEE);  Surgeon: Lelon Perla, MD;  Location: [facility] ENDOSCOPY;  Service: Cardiovascular;  Laterality: N/A;  . TOTAL KNEE ARTHROPLASTY WITH REVISION COMPONENTS Right 11/24/2016   Procedure: Removal of right knee spacer, Right knee arthrodesis;  Surgeon: Rod Can, MD;  Location: [place];  Service: Orthopedics;  Laterality: Right;    Social History:  reports that she quit smoking about 46 years ago. Her smoking use included cigarettes. She has a 2.00 pack-year smoking history. She has never used smokeless tobacco. She reports that she does not drink alcohol and does not use drugs.   Allergies  Allergen Reactions  . Penicillins Hives and Itching    Tolerated Ancef 11/17 and cefepime 02/2017  Has patient had a PCN reaction causing immediate rash, facial/tongue/throat swelling, SOB or lightheadedness with hypotension: Yes Has  patient had a PCN reaction causing severe rash involving mucus membranes or skin necrosis: No Has patient had a PCN reaction that required hospitalization: No Has patient had a PCN reaction occurring within the last 10 years:  # # YES # #  If all of the above answers are "NO", then may proceed with  Cephalosporin use.  . Chocolate Other (See Comments)    Migraines   . Tape Itching  . Ampicillin Hives and Itching    Family History  Problem Relation Age of Onset  . Heart disease Mother   . Cancer Mother        colon  . Heart attack Father     ***  Prior to Admission medications   Medication Sig Start Date End Date Taking? Authorizing Provider  acetaminophen (TYLENOL) 500 MG tablet Take 500-1,000 mg by mouth every 6 (six) hours as needed for moderate pain.    [provider]  cephALEXin (KEFLEX) 500 MG capsule Take 1 capsule (500 mg total) by mouth 2 (two) times daily. 07/25/22   Abonza, Herb Grays, PA-C  Dextran 70-Hypromellose (ARTIFICIAL TEARS PF OP) Place 1 drop into both eyes as needed (for dry eyes).    [provider]  furosemide (LASIX) 20 MG tablet Take 1 tablet (20 mg total) by mouth daily. 01/08/23   Amin, Jeanella Flattery, MD  Multiple Vitamins-Minerals (MULTIVITAMIN PO) Take 1 tablet by mouth daily.    [provider]  Multiple Vitamins-Minerals (PRESERVISION AREDS PO) Take 1 capsule by mouth 2 (two) times daily.    [provider]  rosuvastatin (CRESTOR) 5 MG tablet TAKE 1 TABLET BY MOUTH AT BEDTIME EVERY MONDAY, WEDNESDAY, AND FRIDAY NIGHT. Patient taking differently: Take 5 mg by mouth every Monday, Wednesday, and Friday. 01/01/23   Velva Harman, PA  senna-docusate (SENOKOT-S) 8.6-50 MG tablet Take 1 tablet by mouth at bedtime as needed for moderate constipation. 01/08/23   Damita Lack, MD    Physical Exam: BP 126/66   Pulse 81   Temp 98.8 F (37.1 C) (Rectal)   Resp (!) 27   SpO2 100%   General: 89 y.o. year-old female well developed well nourished in no acute distress.  Alert and oriented x3. Cardiovascular: Regular rate and rhythm with no rubs or gallops.  No thyromegaly or JVD noted.  No lower extremity edema. 2/4 pulses in all 4 extremities. Respiratory: Clear to auscultation with no wheezes or rales. Good inspiratory  effort. Abdomen: Soft nontender nondistended with normal bowel sounds x4 quadrants. Muskuloskeletal: No cyanosis, clubbing or edema noted bilaterally Neuro: CN II-XII intact, strength, sensation, reflexes Skin: No ulcerative lesions noted or rashes Psychiatry: Judgement and insight appear normal. Mood is appropriate for condition and setting          Labs on Admission:  Basic Metabolic Panel: Recent Labs  Lab 01/16/23 1839 01/16/23 1847  NA 138 135  K 4.0 4.1  CL 95*  --   CO2 30  --   GLUCOSE 155*  --   BUN 44*  --   CREATININE 1.18*  --   CALCIUM 8.7*  --    Liver Function Tests: Recent Labs  Lab 01/16/23 1839  AST 20  ALT 22  ALKPHOS 72  BILITOT 1.3*  PROT 6.2*  ALBUMIN 3.1*   No results for input(s): "LIPASE", "AMYLASE" in the last 168 hours. No results for input(s): "AMMONIA" in the last 168 hours. CBC: Recent Labs  Lab 01/16/23 1839 01/16/23 1847  WBC 13.1*  --  NEUTROABS 11.2*  --   HGB 9.3* 10.2*  HCT 29.9* 30.0*  MCV 92.0  --   PLT 317  --    Cardiac Enzymes: No results for input(s): "CKTOTAL", "CKMB", "CKMBINDEX", "TROPONINI" in the last 168 hours.  BNP (last 3 results) Recent Labs    01/01/23 0955 01/03/23 0504  BNP 1,209.2* 518.5*    ProBNP (last 3 results) No results for input(s): "PROBNP" in the last 8760 hours.  CBG: No results for input(s): "GLUCAP" in the last 168 hours.  Radiological Exams on Admission: DG Chest Port 1 View  Result Date: 01/16/2023 CLINICAL DATA:  Question of sepsis. Evaluate for abnormality. Shortness of breath. Diagnosed with pneumonia today. EXAM: PORTABLE CHEST 1 VIEW COMPARISON:  01/01/2023 FINDINGS: Moderate left pleural effusion with consolidation and volume loss in the left lung, likely pneumonia. Changes are progressing since the previous study. Small right pleural effusion. Heart size is enlarged. No pneumothorax. Calcified and tortuous aorta. Degenerative changes in the spine and shoulders. IMPRESSION:  Moderate left pleural effusion with consolidation in the left lung, increasing since prior study. Electronically Signed   By: Lucienne Capers M.D.   On: 01/16/2023 19:09    EKG: I independently viewed the EKG done and my findings are as followed: ***   Assessment/Plan Present on Admission: . CAP (community acquired pneumonia)  Principal Problem:   CAP (community acquired pneumonia)   DVT prophylaxis: ***   Code Status: ***   Family Communication: ***   Disposition Plan: ***   Consults called: ***   Admission status: ***    Status is: Inpatient {Inpatient:23812}   Kayleen Memos MD [facility] Pager [phone]  If 7PM-7AM, please contact night-coverage [email or web address] Password [facility]  01/16/2023, 9:01 PM

## 2023-01-16 NOTE — ED Notes (Signed)
ED TO INPATIENT HANDOFF REPORT  ED Nurse Name and Phone #: Shirlee Limerick [phone]  S Name/Age/Gender Dawn Lawson 89 y.o. female Room/Bed: RESUSC/RESUSC  Code Status   Code Status: Prior  Home/SNF/Other Nursing Home Patient oriented to: self, place, time, and situation Is this baseline? Yes   Triage Complete: Triage complete  Chief Complaint CAP (community acquired pneumonia) [J18.9]  Triage Note Pt BIB EMS due to [facility]. Pt diagnoiswed w pna today, started rocephi\n, last dose at 1100. Pt received duoneb, 2g mag, 125 solumedrol. Pts room air 47%. Pt arrives on non- rebreather.    Allergies Allergies  Allergen Reactions   Penicillins Hives and Itching    Tolerated Ancef 11/17 and cefepime 02/2017  Has patient had a PCN reaction causing immediate rash, facial/tongue/throat swelling, SOB or lightheadedness with hypotension: Yes Has patient had a PCN reaction causing severe rash involving mucus membranes or skin necrosis: No Has patient had a PCN reaction that required hospitalization: No Has patient had a PCN reaction occurring within the last 10 years:  # # YES # #  If all of the above answers are "NO", then may proceed with Cephalosporin use.   Chocolate Other (See Comments)    Migraines    Tape Itching   Ampicillin Hives and Itching    Level of Care/Admitting Diagnosis ED Disposition     ED Disposition  Admit   Condition  --   [place]: [place] [100100]  Level of Care: Progressive [102]  Admit to Progressive based on following criteria: RESPIRATORY PROBLEMS hypoxemic/hypercapnic respiratory failure that is responsive to NIPPV (BiPAP) or High Flow Nasal Cannula (6-80 lpm). Frequent assessment/intervention, no > Q2 hrs < Q4 hrs, to maintain oxygenation and pulmonary hygiene.  May admit patient to [facility] or [facility] if equivalent level of care is available:: Yes  Covid Evaluation: Asymptomatic - no recent exposure (last 10 days)  testing not required  Diagnosis: CAP (community acquired pneumonia) [id number]  Admitting Physician: Kayleen Memos [id number]  Attending Physician: Kayleen Memos [id number]  Certification:: I certify this patient will need inpatient services for at least 2 midnights          B Medical/Surgery History Past Medical History:  Diagnosis Date   AKI (acute kidney injury) ([place]) 03/22/2014   Anemia    Arthritis    ATN (acute tubular necrosis) ([place]) 03/22/2014   Cataract    Heart murmur    Hypertension    Lactic acidosis 03/22/2014   PONV (postoperative nausea and vomiting)    Pre-diabetes    Sepsis due to group B Streptococcus ([place]) 04/14/2014   Severe sepsis with acute organ dysfunction ([place]) 03/22/2014   Status post PICC central line placement 04/14/2014   Urinary tract infection 03/22/2014   Past Surgical History:  Procedure Laterality Date   ABDOMINAL HYSTERECTOMY     partial   APPENDECTOMY     APPLICATION OF WOUND VAC Right 09/18/2016   Procedure: APPLICATION OF WOUND VAC;  Surgeon: Rod Can, MD;  Location: [place];  Service: Orthopedics;  Laterality: Right;   EXCISIONAL TOTAL KNEE ARTHROPLASTY WITH ANTIBIOTIC SPACERS Right 09/18/2016   Procedure: RESECTION OF RIGHT TOTAL KNEE ARTHROPLASTY PLACEMENT OF ANTIBIOTIC SPACER;  Surgeon: Rod Can, MD;  Location: [place];  Service: Orthopedics;  Laterality: Right;   EYE SURGERY     Lens implants   JOINT REPLACEMENT Right    KNEE   KNEE HARDWARE REMOVAL Right 11/24/2016   RECTOCELE REPAIR  TEE WITHOUT CARDIOVERSION N/A 04/09/2014   Procedure: TRANSESOPHAGEAL ECHOCARDIOGRAM (TEE);  Surgeon: Lelon Perla, MD;  Location: [facility] ENDOSCOPY;  Service: Cardiovascular;  Laterality: N/A;   TOTAL KNEE ARTHROPLASTY WITH REVISION COMPONENTS Right 11/24/2016   Procedure: Removal of right knee spacer, Right knee arthrodesis;  Surgeon: Rod Can, MD;  Location: [place];  Service: Orthopedics;  Laterality: Right;     A IV  Location/Drains/Wounds Patient Lines/Drains/Airways Status     Active Line/Drains/Airways     Name Placement date Placement time Site Days   Peripheral IV 01/16/23 20 G Distal;Left;Posterior Forearm 01/16/23  1836  Forearm  less than 1   Peripheral IV 01/16/23 20 G Posterior;Right Forearm 01/16/23  1842  Forearm  less than 1   Negative Pressure Wound Therapy Knee Right 09/18/16  1845  --  2311            Intake/Output Last 24 hours  Intake/Output Summary (Last 24 hours) at 01/16/2023 2110 Last data filed at 01/16/2023 2020 Gross per 24 hour  Intake 350 ml  Output --  Net 350 ml    Labs/Imaging Results for orders placed or performed during the hospital encounter of 01/16/23 (from the past 48 hour(s))  Lactic acid, plasma     Status: None   Collection Time: 01/16/23  6:39 PM  Result Value Ref Range   Lactic Acid, Venous 1.0 0.5 - 1.9 mmol/L    Comment: Performed at [facility], [address], [place], [place] [address]  Comprehensive metabolic panel     Status: Abnormal   Collection Time: 01/16/23  6:39 PM  Result Value Ref Range   Sodium 138 135 - 145 mmol/L   Potassium 4.0 3.5 - 5.1 mmol/L   Chloride 95 (L) 98 - 111 mmol/L   CO2 30 22 - 32 mmol/L   Glucose, Bld 155 (H) 70 - 99 mg/dL    Comment: Glucose reference range applies only to samples taken after fasting for at least 8 hours.   BUN 44 (H) 8 - 23 mg/dL   Creatinine, Ser 1.18 (H) 0.44 - 1.00 mg/dL   Calcium 8.7 (L) 8.9 - 10.3 mg/dL   Total Protein 6.2 (L) 6.5 - 8.1 g/dL   Albumin 3.1 (L) 3.5 - 5.0 g/dL   AST 20 15 - 41 U/L   ALT 22 0 - 44 U/L   Alkaline Phosphatase 72 38 - 126 U/L   Total Bilirubin 1.3 (H) 0.3 - 1.2 mg/dL   GFR, Estimated 44 (L) >60 mL/min    Comment: (NOTE) Calculated using the CKD-EPI Creatinine Equation (2021)    Anion gap 13 5 - 15    Comment: Performed at [facility], [place] [address] [place], [place] [address]  CBC with Differential     Status: Abnormal   Collection  Time: 01/16/23  6:39 PM  Result Value Ref Range   WBC 13.1 (H) 4.0 - 10.5 K/uL   RBC 3.25 (L) 3.87 - 5.11 MIL/uL   Hemoglobin 9.3 (L) 12.0 - 15.0 g/dL   HCT 29.9 (L) 36.0 - 46.0 %   MCV 92.0 80.0 - 100.0 fL   MCH 28.6 26.0 - 34.0 pg   MCHC 31.1 30.0 - 36.0 g/dL   RDW 15.6 (H) 11.5 - 15.5 %   Platelets 317 150 - 400 K/uL   nRBC 0.0 0.0 - 0.2 %   Neutrophils Relative % 84 %   Neutro Abs 11.2 (H) 1.7 - 7.7 K/uL   Lymphocytes  Relative 5 %   Lymphs Abs 0.6 (L) 0.7 - 4.0 K/uL   Monocytes Relative 7 %   Monocytes Absolute 0.9 0.1 - 1.0 K/uL   Eosinophils Relative 2 %   Eosinophils Absolute 0.3 0.0 - 0.5 K/uL   Basophils Relative 1 %   Basophils Absolute 0.1 0.0 - 0.1 K/uL   Immature Granulocytes 1 %   Abs Immature Granulocytes 0.08 (H) 0.00 - 0.07 K/uL    Comment: Performed at [facility] [address], [place], [place] [address]  Protime-INR     Status: None   Collection Time: 01/16/23  6:39 PM  Result Value Ref Range   Prothrombin Time 14.9 11.4 - 15.2 seconds   INR 1.2 0.8 - 1.2    Comment: (NOTE) INR goal varies based on device and disease states. Performed at [facility], [place] [address], [place], [place] [address]   APTT     Status: None   Collection Time: 01/16/23  6:39 PM  Result Value Ref Range   aPTT 25 24 - 36 seconds    Comment: Performed at [facility] [address], [place], [place] [address]  I-Stat venous blood gas, ED     Status: Abnormal   Collection Time: 01/16/23  6:47 PM  Result Value Ref Range   pH, Ven 7.415 7.25 - 7.43   pCO2, Ven 53.2 44 - 60 mmHg   pO2, Ven 51 (H) 32 - 45 mmHg   Bicarbonate 34.1 (H) 20.0 - 28.0 mmol/L   TCO2 36 (H) 22 - 32 mmol/L   O2 Saturation 85 %   Acid-Base Excess 8.0 (H) 0.0 - 2.0 mmol/L   Sodium 135 135 - 145 mmol/L   Potassium 4.1 3.5 - 5.1 mmol/L   Calcium, Ion 1.10 (L) 1.15 - 1.40 mmol/L   HCT 30.0 (L) 36.0 - 46.0 %   Hemoglobin 10.2 (L) 12.0 - 15.0 g/dL   Sample type VENOUS    DG Chest Port  1 View  Result Date: 01/16/2023 CLINICAL DATA:  Question of sepsis. Evaluate for abnormality. Shortness of breath. Diagnosed with pneumonia today. EXAM: PORTABLE CHEST 1 VIEW COMPARISON:  01/01/2023 FINDINGS: Moderate left pleural effusion with consolidation and volume loss in the left lung, likely pneumonia. Changes are progressing since the previous study. Small right pleural effusion. Heart size is enlarged. No pneumothorax. Calcified and tortuous aorta. Degenerative changes in the spine and shoulders. IMPRESSION: Moderate left pleural effusion with consolidation in the left lung, increasing since prior study. Electronically Signed   By: Lucienne Capers M.D.   On: 01/16/2023 19:09    Pending Labs Unresulted Labs (From admission, onward)     Start     Ordered   01/17/23 0500  CBC  Tomorrow morning,   R        01/16/23 2100   01/17/23 0500  Comprehensive metabolic panel  Tomorrow morning,   R        01/16/23 2100   01/17/23 0500  Magnesium  Tomorrow morning,   R        01/16/23 2100   01/17/23 0500  Phosphorus  Tomorrow morning,   R        01/16/23 2100   01/16/23 1826  Blood Culture (routine x 2)  (Undifferentiated presentation (screening labs and basic nursing orders))  BLOOD CULTURE X 2,   STAT      01/16/23 1826   01/16/23 1826  Urinalysis, Routine w reflex microscopic -Urine, Clean  Catch  (Undifferentiated presentation (screening labs and basic nursing orders))  ONCE - URGENT,   URGENT       Question:  Specimen Source  Answer:  Urine, Clean Catch   01/16/23 1826            Vitals/Pain Today's Vitals   01/16/23 2000 01/16/23 2015 01/16/23 2033 01/16/23 2034  BP: 110/80 123/60 126/66   Pulse: 88 93  81  Resp: (!) 26 (!) 26 (!) 27 (!) 27  Temp:      TempSrc:      SpO2: (!) 83% (!) 81%  100%  PainSc:        Isolation Precautions No active isolations  Medications Medications  cefTRIAXone (ROCEPHIN) 2 g in sodium chloride 0.9 % 100 mL IVPB (has no administration in time  range)  azithromycin (ZITHROMAX) 500 mg in sodium chloride 0.9 % 250 mL IVPB (has no administration in time range)  cefTRIAXone (ROCEPHIN) 1 g in sodium chloride 0.9 % 100 mL IVPB (0 g Intravenous Stopped 01/16/23 1930)  azithromycin (ZITHROMAX) 500 mg in sodium chloride 0.9 % 250 mL IVPB (0 mg Intravenous Stopped 01/16/23 2020)  ondansetron (ZOFRAN) injection 4 mg (4 mg Intravenous Given 01/16/23 1853)    Mobility non-ambulatory     Focused Assessments Pulmonary Assessment Handoff:  Lung sounds: Bilateral Breath Sounds: Diminished L Breath Sounds: Expiratory wheezes, Pleural rub R Breath Sounds: Clear O2 Device: Bi-PAP     R Recommendations: See Admitting Provider Note  Report given to:   Additional Notes:

## 2023-01-16 NOTE — ED Provider Notes (Signed)
[name]   CSN: [id number] Arrival date & time:        History  Chief Complaint  Patient presents with   Shortness of Breath    Dawn Lawson is a 89 y.o. female.  89 yo F with a chief complaints of difficulty breathing.  Reportedly this started this morning.  The patient is able to respond yes or no to some questions and tells me she does think she is having some trouble breathing.  She has had some left leg pain off and on.  She was given Rocephin by her facility.  Was given Solu-Medrol magnesium a DuoNeb and then an albuterol treatment by EMS.  They feel like her respiratory distress had improved slightly en route.   Shortness of Breath      Home Medications Prior to Admission medications   Medication Sig Start Date End Date Taking? Authorizing Provider  acetaminophen (TYLENOL) 500 MG tablet Take 500-1,000 mg by mouth every 6 (six) hours as needed for moderate pain.    [provider]  cephALEXin (KEFLEX) 500 MG capsule Take 1 capsule (500 mg total) by mouth 2 (two) times daily. 07/25/22   Abonza, Herb Grays, PA-C  Dextran 70-Hypromellose (ARTIFICIAL TEARS PF OP) Place 1 drop into both eyes as needed (for dry eyes).    [provider]  furosemide (LASIX) 20 MG tablet Take 1 tablet (20 mg total) by mouth daily. 01/08/23   Amin, Jeanella Flattery, MD  Multiple Vitamins-Minerals (MULTIVITAMIN PO) Take 1 tablet by mouth daily.    [provider]  Multiple Vitamins-Minerals (PRESERVISION AREDS PO) Take 1 capsule by mouth 2 (two) times daily.    [provider]  rosuvastatin (CRESTOR) 5 MG tablet TAKE 1 TABLET BY MOUTH AT BEDTIME EVERY MONDAY, WEDNESDAY, AND FRIDAY NIGHT. Patient taking differently: Take 5 mg by mouth every Monday, Wednesday, and Friday. 01/01/23   Velva Harman, PA  senna-docusate (SENOKOT-S) 8.6-50 MG tablet Take 1 tablet by mouth at bedtime as needed for moderate  constipation. 01/08/23   Damita Lack, MD      Allergies    Penicillins, Chocolate, Tape, and Ampicillin    Review of Systems   Review of Systems  Respiratory:  Positive for shortness of breath.     Physical Exam Updated Vital Signs BP 126/66   Pulse 81   Temp 98.8 F (37.1 C) (Rectal)   Resp (!) 27   SpO2 100%  Physical Exam Vitals and nursing note reviewed.  Constitutional:      General: She is not in acute distress.    Appearance: She is well-developed. She is not diaphoretic.  HENT:     Head: Normocephalic and atraumatic.  Eyes:     Pupils: Pupils are equal, round, and reactive to light.  Cardiovascular:     Rate and Rhythm: Normal rate and regular rhythm.     Heart sounds: No murmur heard.    No friction rub. No gallop.  Pulmonary:     Effort: Pulmonary effort is normal.     Breath sounds: Decreased breath sounds present. No wheezing or rales.     Comments: Tachypnea, diminished breath sounds diffusely. Abdominal:     General: There is no distension.     Palpations: Abdomen is soft.     Tenderness: There is no abdominal tenderness.  Musculoskeletal:        General: No tenderness.     Cervical back: Normal range of  motion and neck supple.     Comments: Significant bruising and edema to the left lower extremity.  Both legs are contractured with chronic skin changes to the distal extremities.  She has no obvious discomfort with palpation of the bruising and edema.  I am able to range that hip without obvious discomfort.    Skin:    General: Skin is warm and dry.  Neurological:     Mental Status: She is alert and oriented to person, place, and time.  Psychiatric:        Behavior: Behavior normal.     ED Results / Procedures / Treatments   Labs (all labs ordered are listed, but only abnormal results are displayed) Labs Reviewed  COMPREHENSIVE METABOLIC PANEL - Abnormal; Notable for the following components:      Result Value   Chloride 95 (*)     Glucose, Bld 155 (*)    BUN 44 (*)    Creatinine, Ser 1.18 (*)    Calcium 8.7 (*)    Total Protein 6.2 (*)    Albumin 3.1 (*)    Total Bilirubin 1.3 (*)    GFR, Estimated 44 (*)    All other components within normal limits  CBC WITH DIFFERENTIAL/PLATELET - Abnormal; Notable for the following components:   WBC 13.1 (*)    RBC 3.25 (*)    Hemoglobin 9.3 (*)    HCT 29.9 (*)    RDW 15.6 (*)    Neutro Abs 11.2 (*)    Lymphs Abs 0.6 (*)    Abs Immature Granulocytes 0.08 (*)    All other components within normal limits  I-STAT VENOUS BLOOD GAS, ED - Abnormal; Notable for the following components:   pO2, Ven 51 (*)    Bicarbonate 34.1 (*)    TCO2 36 (*)    Acid-Base Excess 8.0 (*)    Calcium, Ion 1.10 (*)    HCT 30.0 (*)    Hemoglobin 10.2 (*)    All other components within normal limits  CULTURE, BLOOD (ROUTINE X 2)  CULTURE, BLOOD (ROUTINE X 2)  LACTIC ACID, PLASMA  PROTIME-INR  APTT  URINALYSIS, ROUTINE W REFLEX MICROSCOPIC  CBC  COMPREHENSIVE METABOLIC PANEL  MAGNESIUM  PHOSPHORUS    EKG EKG Interpretation  Date/Time:  Tuesday January 16 2023 18:25:57 EDT Ventricular Rate:  100 PR Interval:    QRS Duration: 81 QT Interval:  448 QTC Calculation: 578 R Axis:   88 Text Interpretation: Atrial fibrillation Borderline right axis deviation Low voltage, precordial leads Probable anteroseptal infarct, old Borderline repolarization abnormality Prolonged QT interval No significant change since last tracing Confirmed by Deno Etienne [phone]) on 01/16/2023 6:47:24 PM  Radiology DG Chest Port 1 View  Result Date: 01/16/2023 CLINICAL DATA:  Question of sepsis. Evaluate for abnormality. Shortness of breath. Diagnosed with pneumonia today. EXAM: PORTABLE CHEST 1 VIEW COMPARISON:  01/01/2023 FINDINGS: Moderate left pleural effusion with consolidation and volume loss in the left lung, likely pneumonia. Changes are progressing since the previous study. Small right pleural effusion. Heart  size is enlarged. No pneumothorax. Calcified and tortuous aorta. Degenerative changes in the spine and shoulders. IMPRESSION: Moderate left pleural effusion with consolidation in the left lung, increasing since prior study. Electronically Signed   By: Lucienne Capers M.D.   On: 01/16/2023 19:09    Procedures .Critical Care  Performed by: Deno Etienne, DO Authorized by: Deno Etienne, DO   Critical care provider statement:    Critical care time (minutes):  80  Critical care time was exclusive of:  Separately billable procedures and treating other patients   Critical care was time spent personally by me on the following activities:  Development of treatment plan with patient or surrogate, discussions with consultants, evaluation of patient's response to treatment, examination of patient, ordering and review of laboratory studies, ordering and review of radiographic studies, ordering and performing treatments and interventions, pulse oximetry, re-evaluation of patient's condition and review of old charts   Care discussed with: admitting provider       Medications Ordered in ED Medications  cefTRIAXone (ROCEPHIN) 1 g in sodium chloride 0.9 % 100 mL IVPB (0 g Intravenous Stopped 01/16/23 1930)  azithromycin (ZITHROMAX) 500 mg in sodium chloride 0.9 % 250 mL IVPB (0 mg Intravenous Stopped 01/16/23 2020)  ondansetron (ZOFRAN) injection 4 mg (4 mg Intravenous Given 01/16/23 1853)    ED Course/ Medical Decision Making/ A&P                             Medical Decision Making Amount and/or Complexity of Data Reviewed Labs: ordered. Radiology: ordered. ECG/medicine tests: ordered.  Risk Prescription drug management. Decision regarding hospitalization.   89 yo F with a chief complaints of difficulty breathing.  This was reported this morning.  Patient was just in the hospital for CHF exacerbation.  Here likely with pneumonia with a very productive cough in the room and tachypnea.  Perhaps  aspiration with acute onset.  Will obtain a chest x-ray give antibiotics.  Chest x-ray on my independent interpretation with likely new left-sided infiltrates.  No hypercarbia, lactate normal leukocytosis of 13,000.  Will discuss with medicine for admission.  Patient was transitioned to BiPAP.  The patients results and plan were reviewed and discussed.   Any x-rays performed were independently reviewed by myself.   Differential diagnosis were considered with the presenting HPI.  Medications  cefTRIAXone (ROCEPHIN) 1 g in sodium chloride 0.9 % 100 mL IVPB (0 g Intravenous Stopped 01/16/23 1930)  azithromycin (ZITHROMAX) 500 mg in sodium chloride 0.9 % 250 mL IVPB (0 mg Intravenous Stopped 01/16/23 2020)  ondansetron (ZOFRAN) injection 4 mg (4 mg Intravenous Given 01/16/23 1853)    Vitals:   01/16/23 2000 01/16/23 2015 01/16/23 2033 01/16/23 2034  BP: 110/80 123/60 126/66   Pulse: 88 93  81  Resp: (!) 26 (!) 26 (!) 27 (!) 27  Temp:      TempSrc:      SpO2: (!) 83% (!) 81%  100%    Final diagnoses:  Acute on chronic respiratory failure with hypoxia (HCC)    Admission/ observation were discussed with the admitting physician, patient and/or family and they are comfortable with the plan.          Final Clinical Impression(s) / ED Diagnoses Final diagnoses:  Acute on chronic respiratory failure with hypoxia [facility])    Rx / DC Orders ED Discharge Orders     None         Deno Etienne, DO 01/16/23 2108

## 2023-01-16 NOTE — Progress Notes (Signed)
RT placed pt on bipap at this time. RN and MD made aware.

## 2023-01-16 NOTE — H&P (Incomplete)
History and Physical  Dawn Lawson [id number] DOB: 10-25-1928 DOA: 01/16/2023  Referring physician: Dr. Tyrone Nine, [place]. PCP: [name], NP  Outpatient Specialists: Orthopedic surgery, infectious disease. Patient coming from: Home.  Chief Complaint: Acute respiratory distress.  HPI: Dawn Lawson is a 89 y.o. female with medical history significant for prediabetes, hyperlipidemia, hypertension, physical debility, obesity, arthritis, HFpEF with grade 2 diastolic dysfunction, recently admitted and diagnosed with acute CHF and new onset A-fib, not on oral anticoagulation due to left thigh hematoma with ecchymosis noted, who presented to [facility] ED via EMS with acute respiratory distress.    Associated with progressive cough.  Nonrebreather was applied.  Upon arrival to the ED was initially placed on 5 to 6 L nasal cannula without significant improvement therefore was placed on BiPAP.  At the time of this visit the patient is somnolent and on BiPAP.  Volume overload on exam.  Personally reviewed chest x-ray done on admission which shows moderate left pleural effusion with concern for possible left lower lobe infiltrates.  Due to concern for CAP, the patient was started on empiric IV antibiotics by EDP.  TRH, hospitalist service, was asked to admit.  ED Course: Tmax 98.8.  BP 109/52, pulse 78, respiratory rate 20, O2 saturation 100% on BiPAP.  Lab studies remarkable for serum glucose 155, BUN 44, creatinine 1.18 with GFR 44.  T. bili 1.3.  Hemoglobin 9.3.  WBC 13.1.  Review of Systems: Review of systems as noted in the HPI. All other systems reviewed and are negative.   Past Medical History:  Diagnosis Date   AKI (acute kidney injury) ([place]) 03/22/2014   Anemia    Arthritis    ATN (acute tubular necrosis) ([place]) 03/22/2014   Cataract    Heart murmur    Hypertension    Lactic acidosis 03/22/2014   PONV (postoperative nausea and vomiting)    Pre-diabetes    Sepsis due to group B  Streptococcus ([place]) 04/14/2014   Severe sepsis with acute organ dysfunction ([place]) 03/22/2014   Status post PICC central line placement 04/14/2014   Urinary tract infection 03/22/2014   Past Surgical History:  Procedure Laterality Date   ABDOMINAL HYSTERECTOMY     partial   APPENDECTOMY     APPLICATION OF WOUND VAC Right 09/18/2016   Procedure: APPLICATION OF WOUND VAC;  Surgeon: Rod Can, MD;  Location: [facility];  Service: Orthopedics;  Laterality: Right;   EXCISIONAL TOTAL KNEE ARTHROPLASTY WITH ANTIBIOTIC SPACERS Right 09/18/2016   Procedure: RESECTION OF RIGHT TOTAL KNEE ARTHROPLASTY PLACEMENT OF ANTIBIOTIC SPACER;  Surgeon: Rod Can, MD;  Location: [place];  Service: Orthopedics;  Laterality: Right;   EYE SURGERY     Lens implants   JOINT REPLACEMENT Right    KNEE   KNEE HARDWARE REMOVAL Right 11/24/2016   RECTOCELE REPAIR     TEE WITHOUT CARDIOVERSION N/A 04/09/2014   Procedure: TRANSESOPHAGEAL ECHOCARDIOGRAM (TEE);  Surgeon: Lelon Perla, MD;  Location[facility];  Service: Cardiovascular;  Laterality: N/A;   TOTAL KNEE ARTHROPLASTY WITH REVISION COMPONENTS Right 11/24/2016   Procedure: Removal of right knee spacer, Right knee arthrodesis;  Surgeon: Rod Can, MD;  Location: [facility];  Service: Orthopedics;  Laterality: Right;    Social History:  reports that she quit smoking about 46 years ago. Her smoking use included cigarettes. She has a 2.00 pack-year smoking history. She has never used smokeless tobacco. She reports that she does not drink alcohol and does not use drugs.   Allergies  Allergen Reactions   Penicillins Hives and Itching    Tolerated Ancef 11/17 and cefepime 02/2017  Has patient had a PCN reaction causing immediate rash, facial/tongue/throat swelling, SOB or lightheadedness with hypotension: Yes Has patient had a PCN reaction causing severe rash involving mucus membranes or skin necrosis: No Has patient had a PCN reaction that required  hospitalization: No Has patient had a PCN reaction occurring within the last 10 years:  # # YES # #  If all of the above answers are "NO", then may proceed with Cephalosporin use.   Chocolate Other (See Comments)    Migraines    Tape Itching   Ampicillin Hives and Itching    Family History  Problem Relation Age of Onset   Heart disease Mother    Cancer Mother        colon   Heart attack Father       Prior to Admission medications   Medication Sig Start Date End Date Taking? Authorizing Provider  acetaminophen (TYLENOL) 500 MG tablet Take 500-1,000 mg by mouth every 6 (six) hours as needed for moderate pain.    [provider]  cephALEXin (KEFLEX) 500 MG capsule Take 1 capsule (500 mg total) by mouth 2 (two) times daily. 07/25/22   Abonza, Herb Grays, PA-C  Dextran 70-Hypromellose (ARTIFICIAL TEARS PF OP) Place 1 drop into both eyes as needed (for dry eyes).    [provider]  furosemide (LASIX) 20 MG tablet Take 1 tablet (20 mg total) by mouth daily. 01/08/23   Amin, Jeanella Flattery, MD  Multiple Vitamins-Minerals (MULTIVITAMIN PO) Take 1 tablet by mouth daily.    [provider]  Multiple Vitamins-Minerals (PRESERVISION AREDS PO) Take 1 capsule by mouth 2 (two) times daily.    [provider]  rosuvastatin (CRESTOR) 5 MG tablet TAKE 1 TABLET BY MOUTH AT BEDTIME EVERY MONDAY, WEDNESDAY, AND FRIDAY NIGHT. Patient taking differently: Take 5 mg by mouth every Monday, Wednesday, and Friday. 01/01/23   Velva Harman, PA  senna-docusate (SENOKOT-S) 8.6-50 MG tablet Take 1 tablet by mouth at bedtime as needed for moderate constipation. 01/08/23   Damita Lack, MD    Physical Exam: BP 126/66   Pulse 81   Temp 98.8 F (37.1 C) (Rectal)   Resp (!) 27   SpO2 100%   General: 89 y.o. year-old female well developed well nourished in no acute distress.  Somnolent, on BiPAP. Cardiovascular: Regular rate and rhythm with no rubs or gallops.  No thyromegaly  or JVD noted.  3+ pitting edema in lower extremities bilaterally.  Respiratory: Mild rales at bases.  Poor inspiratory effort. Abdomen: Soft nontender nondistended with normal bowel sounds x4 quadrants. Muskuloskeletal: No cyanosis or clubbing noted bilaterally Neuro: CN II-XII intact, strength, sensation, reflexes Skin: Ecchymosis noted left thigh. Psychiatry: Unable to assess judgment and mood due to somnolence.         Labs on Admission:  Basic Metabolic Panel: Recent Labs  Lab 01/16/23 1839 01/16/23 1847  NA 138 135  K 4.0 4.1  CL 95*  --   CO2 30  --   GLUCOSE 155*  --   BUN 44*  --   CREATININE 1.18*  --   CALCIUM 8.7*  --    Liver Function Tests: Recent Labs  Lab 01/16/23 1839  AST 20  ALT 22  ALKPHOS 72  BILITOT 1.3*  PROT 6.2*  ALBUMIN 3.1*   No results for input(s): "LIPASE", "AMYLASE" in the last 168 hours. No  results for input(s): "AMMONIA" in the last 168 hours. CBC: Recent Labs  Lab 01/16/23 1839 01/16/23 1847  WBC 13.1*  --   NEUTROABS 11.2*  --   HGB 9.3* 10.2*  HCT 29.9* 30.0*  MCV 92.0  --   PLT 317  --    Cardiac Enzymes: No results for input(s): "CKTOTAL", "CKMB", "CKMBINDEX", "TROPONINI" in the last 168 hours.  BNP (last 3 results) Recent Labs    01/01/23 0955 01/03/23 0504  BNP 1,209.2* 518.5*    ProBNP (last 3 results) No results for input(s): "PROBNP" in the last 8760 hours.  CBG: No results for input(s): "GLUCAP" in the last 168 hours.  Radiological Exams on Admission: DG Chest Port 1 View  Result Date: 01/16/2023 CLINICAL DATA:  Question of sepsis. Evaluate for abnormality. Shortness of breath. Diagnosed with pneumonia today. EXAM: PORTABLE CHEST 1 VIEW COMPARISON:  01/01/2023 FINDINGS: Moderate left pleural effusion with consolidation and volume loss in the left lung, likely pneumonia. Changes are progressing since the previous study. Small right pleural effusion. Heart size is enlarged. No pneumothorax. Calcified and  tortuous aorta. Degenerative changes in the spine and shoulders. IMPRESSION: Moderate left pleural effusion with consolidation in the left lung, increasing since prior study. Electronically Signed   By: Lucienne Capers M.D.   On: 01/16/2023 19:09    EKG: I independently viewed the EKG done and my findings are as followed: Atrial fibrillation rate of 100 nonspecific ST-T changes.  QTc 578.  Assessment/Plan Present on Admission:  CAP (community acquired pneumonia)  Principal Problem:   CAP (community acquired pneumonia)  Sepsis secondary to CAP, POA Presented with leukocytosis, tachypnea RR 27, infiltrates on left lower lobe with concern for pneumonia Started on empiric IV antibiotics Rocephin and azithromycin. Obtain urine antigen strep pneumonia, urine antigen Legionella Baseline procalcitonin DuoNebs every 6 hours Monitor fever curve and WBC.  Acute metabolic encephalopathy in the setting of above Treat underlying conditions Reorient as needed Fall precautions, aspiration precautions Delirium precautions.  Acute hypoxic respiratory failure secondary to moderate left pleural effusion, left lower lobe CAP versus others Not on oxygen supplementation at baseline Currently on BiPAP due to acute respiratory distress, seen on presentation Was found to have a O2 saturation in the 40s upon EMS arrival Maintain O2 saturation greater than 92%. Wean off BiPAP as tolerated  Moderate left pleural effusion Possibly cardiogenic The patient had a recent echocardiogram which showed LVEF 60-65%, severely dilated bilateral atria IR consulted for possible thoracentesis Incentive spirometer Early mobilization as tolerated  Bilateral lower extremity edema Sedentary lifestyle, recent hospitalization Obtain Doppler ultrasound to rule out DVT Elevate lower extremities  Permanent atrial fibrillation, was taken off oral anticoagulation due to severe bruising and risk for bleeding. Closely monitor  on telemetry  Prolonged QTc Admission twelve-lead EKG revealed QTc 578 Avoid QTc prolonging agents Optimize magnesium and potassium levels Closely monitor on telemetry  Physical debility PT OT assessment Fall precautions.  Prediabetes with hyperglycemia Obtain hemoglobin A1c Last hemoglobin A1c 6.1 on 10/02/2022. Strict n.p.o. while on BiPAP. Hold off insulin sliding scale to avoid hypoglycemia.  CKD 3B Appears to be at her baseline creatinine 1.1 with GFR 44 Avoid nephrotoxic agent, dehydration and hypotension Monitor urine output Repeat renal panel in the morning.  Isolated hyperbilirubinemia Total bilirubin 1.3 Trend Avoid hepatotoxic agents.  Anemia of chronic disease Hemoglobin 9.3, at baseline Has a large ecchymosis involving left thigh. Continue to monitor demarcated area and H&H  Large ecchymosis involving left thigh. No trauma per his  son at bedside. Unclear etiology Off anticoagulation Continue to monitor demarcated area    Critical care time: 65 minutes.    DVT prophylaxis: SCDs, need to rule out DVT, follow Doppler ultrasound of lower extremities.  Remove SCDs if positive for DVT.  Code Status: DNR, the patient has a DNR form in the room.  Family Communication: The patient's son was updated at bedside.  Disposition Plan: Admitted to progressive care unit  Consults called: None.  Admission status: Inpatient status.   Status is: Inpatient The patient requires at least 2 midnights for further evaluation and treatment of present condition.   Kayleen Memos MD [facility] Pager [phone]  If 7PM-7AM, please contact night-coverage [email or web address] Password [facility]  01/16/2023, 9:01 PM

## 2023-01-17 ENCOUNTER — Inpatient Hospital Stay (HOSPITAL_COMMUNITY): Payer: Medicare Other

## 2023-01-17 DIAGNOSIS — J9621 Acute and chronic respiratory failure with hypoxia: Secondary | ICD-10-CM | POA: Diagnosis not present

## 2023-01-17 DIAGNOSIS — R0902 Hypoxemia: Secondary | ICD-10-CM | POA: Diagnosis not present

## 2023-01-17 DIAGNOSIS — R233 Spontaneous ecchymoses: Secondary | ICD-10-CM | POA: Diagnosis not present

## 2023-01-17 DIAGNOSIS — M7989 Other specified soft tissue disorders: Secondary | ICD-10-CM

## 2023-01-17 DIAGNOSIS — J189 Pneumonia, unspecified organism: Secondary | ICD-10-CM | POA: Diagnosis not present

## 2023-01-17 DIAGNOSIS — N1832 Chronic kidney disease, stage 3b: Secondary | ICD-10-CM

## 2023-01-17 DIAGNOSIS — I5033 Acute on chronic diastolic (congestive) heart failure: Secondary | ICD-10-CM | POA: Diagnosis not present

## 2023-01-17 DIAGNOSIS — R5383 Other fatigue: Secondary | ICD-10-CM | POA: Diagnosis not present

## 2023-01-17 LAB — COMPREHENSIVE METABOLIC PANEL
ALT: 21 U/L (ref 0–44)
AST: 15 U/L (ref 15–41)
Albumin: 2.8 g/dL — ABNORMAL LOW (ref 3.5–5.0)
Alkaline Phosphatase: 62 U/L (ref 38–126)
Anion gap: 14 (ref 5–15)
BUN: 48 mg/dL — ABNORMAL HIGH (ref 8–23)
CO2: 28 mmol/L (ref 22–32)
Calcium: 8.3 mg/dL — ABNORMAL LOW (ref 8.9–10.3)
Chloride: 96 mmol/L — ABNORMAL LOW (ref 98–111)
Creatinine, Ser: 1.16 mg/dL — ABNORMAL HIGH (ref 0.44–1.00)
GFR, Estimated: 45 mL/min — ABNORMAL LOW (ref >60)
Glucose, Bld: 155 mg/dL — ABNORMAL HIGH (ref 70–99)
Potassium: 4.1 mmol/L (ref 3.5–5.1)
Sodium: 138 mmol/L (ref 135–145)
Total Bilirubin: 0.4 mg/dL (ref 0.3–1.2)
Total Protein: 5.6 g/dL — ABNORMAL LOW (ref 6.5–8.1)

## 2023-01-17 LAB — CBC
HCT: 28.2 % — ABNORMAL LOW (ref 36.0–46.0)
Hemoglobin: 9 g/dL — ABNORMAL LOW (ref 12.0–15.0)
MCH: 29 pg (ref 26.0–34.0)
MCHC: 31.9 g/dL (ref 30.0–36.0)
MCV: 91 fL (ref 80.0–100.0)
Platelets: 253 10*3/uL (ref 150–400)
RBC: 3.1 MIL/uL — ABNORMAL LOW (ref 3.87–5.11)
RDW: 15.4 % (ref 11.5–15.5)
WBC: 9.5 10*3/uL (ref 4.0–10.5)
nRBC: 0 % (ref 0.0–0.2)

## 2023-01-17 LAB — BRAIN NATRIURETIC PEPTIDE: B Natriuretic Peptide: 1136.7 pg/mL — ABNORMAL HIGH (ref 0.0–100.0)

## 2023-01-17 LAB — MAGNESIUM: Magnesium: 2.7 mg/dL — ABNORMAL HIGH (ref 1.7–2.4)

## 2023-01-17 LAB — PHOSPHORUS: Phosphorus: 4.9 mg/dL — ABNORMAL HIGH (ref 2.5–4.6)

## 2023-01-17 LAB — STREP PNEUMONIAE URINARY ANTIGEN: Strep Pneumo Urinary Antigen: NEGATIVE

## 2023-01-17 MED ORDER — POLYETHYLENE GLYCOL 3350 17 G PO PACK: 17.0000 g | PACK | Freq: Every day | ORAL | Status: DC | PRN

## 2023-01-17 MED ORDER — PROCHLORPERAZINE EDISYLATE 10 MG/2ML IJ SOLN: 5.0000 mg | Freq: Four times a day (QID) | INTRAMUSCULAR | Status: DC | PRN

## 2023-01-17 MED ORDER — FUROSEMIDE 10 MG/ML IJ SOLN
40.0000 mg | Freq: Once | INTRAMUSCULAR | Status: AC
  Administered 2023-01-17: 40 mg via INTRAVENOUS
  Filled 2023-01-17: qty 4

## 2023-01-17 MED ORDER — FUROSEMIDE 10 MG/ML IJ SOLN
40.0000 mg | Freq: Two times a day (BID) | INTRAMUSCULAR | Status: DC
  Administered 2023-01-17 – 2023-01-18 (×3): 40 mg via INTRAVENOUS
  Filled 2023-01-17 (×3): qty 4

## 2023-01-17 MED ORDER — IPRATROPIUM-ALBUTEROL 0.5-2.5 (3) MG/3ML IN SOLN
3.0000 mL | Freq: Four times a day (QID) | RESPIRATORY_TRACT | Status: DC
  Administered 2023-01-17 – 2023-01-22 (×22): 3 mL via RESPIRATORY_TRACT
  Filled 2023-01-17 (×16): qty 3
  Filled 2023-01-17: qty 6
  Filled 2023-01-17 (×4): qty 3

## 2023-01-17 MED ORDER — ACETAMINOPHEN 325 MG PO TABS
650.0000 mg | ORAL_TABLET | Freq: Four times a day (QID) | ORAL | Status: DC | PRN
  Administered 2023-01-21: 650 mg via ORAL
  Filled 2023-01-17: qty 2

## 2023-01-17 NOTE — Plan of Care (Signed)
Pt is alert and stable. Pt continue on th bipap, noted. No c/o pain or discomfort at this time.  Problem: Education: Goal: Knowledge of General Education information will improve Description: Including pain rating scale, medication(s)/side effects and non-pharmacologic comfort measures Outcome: Not Progressing   Problem: Health Behavior/Discharge Planning: Goal: Ability to manage health-related needs will improve Outcome: Not Progressing   Problem: Clinical Measurements: Goal: Ability to maintain clinical measurements within normal limits will improve Outcome: Not Progressing Goal: Will remain free from infection Outcome: Not Progressing Goal: Diagnostic test results will improve Outcome: Not Progressing Goal: Respiratory complications will improve Outcome: Not Progressing Goal: Cardiovascular complication will be avoided Outcome: Not Progressing

## 2023-01-17 NOTE — Progress Notes (Signed)
Bilateral lower extremity venous study completed.   Preliminary results relayed to MD and RN.  Please see CV Procedures for preliminary results.  Corby Villasenor, RVT  10:25 AM 01/17/23

## 2023-01-17 NOTE — Evaluation (Signed)
Occupational Therapy Evaluation Patient Details Name: Dawn Lawson MRN: [id number] DOB: [birth year] Today's Date: 01/17/2023   History of Present Illness 89 yo female diagnosed with acute CHF and new onset of A-fib presenting with acute respiratory distress with a progressive cough. PMH HLD HTN obesity arthritis HRpEF with grade 2 diastolic dysfunction cataract, R TKA   Clinical Impression   PT admitted with CHF with afib respiratory distress. Pt currently with functional limitiations due to the deficits listed below (see OT problem list). Pt admitting from [facility] since recent admission to [facility] just prior. Pt demonstrates dependence for all adls at this time due to generalized weakness. Pt is high risk for skin break down and noted R heel break down with pillow boot applied. Pt benefits from frequent repositioning every two hours or air mattress.  Pt will benefit from skilled OT to increase their independence and safety with adls and balance to allow discharge skilled inpatient follow up therapy, <3 hours/day.       Recommendations for follow up therapy are one component of a multi-disciplinary discharge planning process, led by the attending physician.  Recommendations may be updated based on patient status, additional functional criteria and insurance authorization.   Assistance Recommended at Discharge Frequent or constant Supervision/Assistance  Patient can return home with the following Two people to help with walking and/or transfers;Assistance with cooking/housework;Direct supervision/assist for medications management;Assist for transportation;Direct supervision/assist for financial management;Help with stairs or ramp for entrance;Two people to help with bathing/dressing/bathroom    Functional Status Assessment  Patient has had a recent decline in their functional status and demonstrates the ability to make significant improvements in function in a reasonable and predictable  amount of time.  Equipment Recommendations  BSC/3in1;Hospital bed;Wheelchair cushion (measurements OT);Wheelchair (measurements OT);Other (comment) (hoyer)    Recommendations for Other Services Other (comment) (palliative team)     Precautions / Restrictions Precautions Precautions: Fall Precaution Comments: right knee fused, watch O2      Mobility Bed Mobility   Bed Mobility: Rolling, Supine to Sit Rolling: +2 for physical assistance, Max assist   Supine to sit: Max assist, HOB elevated     General bed mobility comments: pt sitting eob unable to achieve static sitting pt holding breath. return to supine. Pt with decreased 02 in R side lying compared to rolling L side this session.    Transfers                   General transfer comment: not tested due to tolerance. Now on HFNC 10 L      Balance Overall balance assessment: History of Falls Sitting-balance support: No upper extremity supported, Feet supported Sitting balance-Leahy Scale: Zero                                     ADL either performed or assessed with clinical judgement   ADL Overall ADL's : Needs assistance/impaired Eating/Feeding: Maximal assistance   Grooming: Maximal assistance   Upper Body Bathing: Maximal assistance   Lower Body Bathing: Total assistance   Upper Body Dressing : Maximal assistance   Lower Body Dressing: Total assistance                 General ADL Comments: pt incontinence in the bed with purewick with lack of awareness to wet bed surface. pt coughing and peeing each time     Vision Ability to See in  Adequate Light: 1 Impaired Vision Assessment?: Vision impaired- to be further tested in functional context Additional Comments: pt unable to see daughter in law standing on R peripheral vision during session     Perception     Praxis      Pertinent Vitals/Pain Pain Assessment Pain Assessment: No/denies pain     Hand Dominance Right    Extremity/Trunk Assessment Upper Extremity Assessment Upper Extremity Assessment: Generalized weakness RUE Deficits / Details: edema present able to hold against gravity but fatigues LUE Deficits / Details: edema present able to hold against gravity but fatigues   Lower Extremity Assessment Lower Extremity Assessment: Defer to PT evaluation RLE Deficits / Details: knee fused in extension, noted edema and redness lower leg. LLE Deficits / Details: edema and bruising above knee   Cervical / Trunk Assessment Cervical / Trunk Assessment: Other exceptions Cervical / Trunk Exceptions: kyphotic but difficult to truly test due to inability to static sit at this time   Communication Communication Communication: No difficulties   Cognition Arousal/Alertness: Awake/alert Behavior During Therapy: WFL for tasks assessed/performed Overall Cognitive Status: History of cognitive impairments - at baseline                                       General Comments  vitals stable during session and monitored closely. pt holding breath and mouth breathing. pt with poor wave form so 02 readings are inconsistent. pt with saturations 85% on HFNC 10L    Exercises     Shoulder Instructions      Home Living Family/patient expects to be discharged to:: Private residence Living Arrangements: Alone Available Help at Discharge: Family;Available PRN/intermittently Type of Home: House Home Access: Stairs to enter [employer] of Steps: 2 Entrance Stairs-Rails: Right;Left Home Layout: One level     Bathroom Shower/Tub: Teacher, early years/pre: Standard     Home Equipment: BSC/3in1;Standard Environmental consultant   Additional Comments: son checks on patient 4 x per day. pt was at [facility] care following recent d/c from [facility] 3/18      Prior Functioning/Environment Prior Level of Function : Independent/Modified Independent             Mobility Comments: has lift chair,  was ambulating prior to 3/10 ADLs Comments: patient had help from family for meals and IADL tasks. patient has lift chair with family helping with sit to stands.        OT Problem List: Decreased strength;Decreased range of motion;Decreased activity tolerance;Impaired balance (sitting and/or standing);Decreased cognition;Decreased knowledge of precautions;Decreased knowledge of use of DME or AE;Decreased safety awareness;Cardiopulmonary status limiting activity;Impaired UE functional use;Increased edema      OT Treatment/Interventions: Self-care/ADL training;Therapeutic exercise;Neuromuscular education;Energy conservation;DME and/or AE instruction;Manual therapy;Modalities;Therapeutic activities;Cognitive remediation/compensation;Visual/perceptual remediation/compensation;Patient/family education;Balance training    OT Goals(Current goals can be found in the care plan section) Acute Rehab OT Goals Patient Stated Goal: none stated this session OT Goal Formulation: With patient/family Time For Goal Achievement: 01/31/23 Potential to Achieve Goals: Fair  OT Frequency: Min 2X/week    Co-evaluation              AM-PAC OT "6 Clicks" Daily Activity     Outcome Measure Help from another person eating meals?: A Lot Help from another person taking care of personal grooming?: A Lot Help from another person toileting, which includes using toliet, bedpan, or urinal?: Total Help from another person bathing (including  washing, rinsing, drying)?: Total Help from another person to put on and taking off regular upper body clothing?: A Lot Help from another person to put on and taking off regular lower body clothing?: Total 6 Click Score: 9   End of Session Equipment Utilized During Treatment: Oxygen Nurse Communication: Mobility status;Precautions;Need for lift equipment  Activity Tolerance: Patient tolerated treatment well Patient left: in bed;with call bell/phone within reach;with  family/visitor present;with bed alarm set  OT Visit Diagnosis: Muscle weakness (generalized) (M62.81)                Time: [id number] OT Time Calculation (min): 25 min Charges:  OT General Charges $OT Visit: 1 Visit OT Evaluation $OT Eval Moderate Complexity: 1 Mod OT Treatments $Self Care/Home Management : 8-22 mins   Brynn, OTR/L  Acute Rehabilitation Services Office: [phone] .   Jeri Modena 01/17/2023, 11:00 AM

## 2023-01-17 NOTE — Progress Notes (Signed)
  Transition of Care [facility]) Screening Note   Patient Details  Name: Dawn Lawson Date of Birth: February 10, 1929   Transition of Care [facility]) CM/SW Contact:    Benard Halsted, LCSW Phone Number: 01/17/2023, 1:15 PM    Transition of Care Department [facility]) has reviewed patient and confirmed that patient is from [facility] for short term rehab. We will continue to monitor patient advancement through interdisciplinary progression rounds. If new patient transition needs arise, please place a TOC consult.

## 2023-01-17 NOTE — Progress Notes (Signed)
OT Cancellation Note  Patient Details Name: KYRSTEN GONGWER MRN: [id number] DOB: 01/08/1929   Cancelled Treatment:    Reason Eval/Treat Not Completed: Patient at procedure or test/ unavailable (doppler check in room) OT to check back at the next most appropriate time.  [phone] Hollie Beach 01/17/2023, 10:21 AM

## 2023-01-17 NOTE — Progress Notes (Signed)
PROGRESS NOTE        PATIENT DETAILS Name: Dawn Lawson Age: 89 y.o. Sex: female Date of Birth: 04-02-1929 Admit Date: 01/16/2023 Admitting Physician Kayleen Memos, DO [id number], [name], NP  Brief Summary: Patient is a 89 y.o.  female with history of chronic HFpEF, atrial fibrillation-not on anticoagulation due to recent left thigh hematoma, HTN, history of prior right knee prosthetic infection, chronic disability-just to SNF-presented to hospital with shortness of breath-found to have acute hypoxic respiratory failure due to combination of aspiration pneumonia and acute on chronic HFrEF.  See below for further details.  Significant events: 3/11-3/18>> hospitalization for HFrEF/respiratory failure-left thigh hematoma. 3/26>> admit to [facility]-severe hypoxia requiring BiPAP-aspiration PNA/HFpEF exacerbation.  Significant studies: 3/26>> CXR: Left lung consolidation/moderate pleural effusion.  Significant microbiology data: 3/26>> blood culture: Pending  Procedures: None  Consults: None  Subjective: Awake/alert-on BiPAP.  Objective: Vitals: Blood pressure 126/71, pulse 80, temperature 98.8 F (37.1 C), temperature source Rectal, resp. rate 16, SpO2 97 %.   Exam: Gen Exam:Alert awake-not in any distress HEENT:atraumatic, normocephalic Chest: Some transmitted upper airway sounds. CVS:S1S2 regular Abdomen:soft non tender, non distended Extremities:no edema Neurology: Non focal Skin: no rash  Pertinent Labs/Radiology:    Latest Ref Rng & Units 01/17/2023    3:38 AM 01/16/2023    6:47 PM 01/16/2023    6:39 PM  CBC  WBC 4.0 - 10.5 K/uL 9.5   13.1   Hemoglobin 12.0 - 15.0 g/dL 9.0  10.2  9.3   Hematocrit 36.0 - 46.0 % 28.2  30.0  29.9   Platelets 150 - 400 K/uL 253   317     Lab Results  Component Value Date   NA 138 01/17/2023   K 4.1 01/17/2023   CL 96 (L) 01/17/2023   CO2 28 01/17/2023      Assessment/Plan: Acute hypoxic  respiratory failure likely due to aspiration pneumonia and HFpEF exacerbation Feels better-will give 1 dose of IV Lasix and attempt to liberate off BiPAP Continue IV antibiotics Follow closely.  Aspiration pneumonia Once off BiPAP-will need SLP eval.  Family acknowledges "choking episodes" over the past several days at SNF Continue IV antibiotics-follow cultures and SLP recommendations  Acute on chronic HFpEF Grossly volume overloaded-2+ pitting edema Will start IV Lasix to ensure negative balance  Left-sided pleural effusion Chronic issue-seen in prior imaging studies as well I suspect this is from CHF-likely a transudate Hold off on thoracocentesis for now-will attempt to diurese/wean off BiPAP first to see how she does before pursuing thoracocentesis in this frail elderly patient.  Persistent atrial fibrillation Rate controlled without the use of any medications Not a candidate for anticoagulation-due to advanced age-and due to recent left thigh hematoma  Left thigh hematoma Stable left leg exam from prior hospitalization-some ecchymosis still persist Supportive care at this point.  HTN All antihypertensives were discontinued last hospitalization BP appears reasonable-will follow closely.  Normocytic anemia Due to acute illness/recent left thigh hematoma Follow Hb periodically.  CKD stage IIIb At baseline  Palliative care Frail elderly patient-now with aspiration pneumonia and resultant severe hypoxic respiratory failure Son at bedside-family aware that apart from what we are doing-really no role for further escalation in care.  If she deteriorates-she will need residential hospice.  Plan is to follow clinical course and see how she does.  Obesity: Estimated body mass index is  34.15 kg/m as calculated from the following:   Height as of 01/01/23: 5\' 2"  (1.575 m).   Weight as of 01/08/23: 84.7 kg.   Code status:   Code Status: DNR   DVT Prophylaxis: SCDs Start:  01/17/23 0009    Family Communication: Son at bedside   Disposition Plan: Status is: Inpatient Remains inpatient appropriate because: Severity of illness   Planned Discharge Destination:Skilled nursing facility   Diet: Diet Order             Diet NPO time specified  Diet effective now                     Antimicrobial agents: Anti-infectives (From admission, onward)    Start     Dose/Rate Route Frequency Ordered Stop   01/17/23 1800  cefTRIAXone (ROCEPHIN) 2 g in sodium chloride 0.9 % 100 mL IVPB        2 g 200 mL/hr over 30 Minutes Intravenous Every 24 hours 01/16/23 2109     01/17/23 1800  azithromycin (ZITHROMAX) 500 mg in sodium chloride 0.9 % 250 mL IVPB        500 mg 250 mL/hr  Intravenous Every 24 hours 01/16/23 2109     01/16/23 1830  cefTRIAXone (ROCEPHIN) 1 g in sodium chloride 0.9 % 100 mL IVPB        1 g 200 mL/hr over 30 Minutes Intravenous  Once 01/16/23 1826 01/16/23 1930   01/16/23 1830  azithromycin (ZITHROMAX) 500 mg in sodium chloride 0.9 % 250 mL IVPB        500 mg 250 mL/hr over 60 Minutes Intravenous  Once 01/16/23 1826 01/16/23 2020        MEDICATIONS: Scheduled Meds:  ipratropium-albuterol  3 mL Nebulization Q6H   Continuous Infusions:  azithromycin (ZITHROMAX) 500 mg in sodium chloride 0.9 % 250 mL IVPB     cefTRIAXone (ROCEPHIN)  IV     PRN Meds:.acetaminophen, polyethylene glycol, prochlorperazine   I have personally reviewed following labs and imaging studies  LABORATORY DATA: CBC: Recent Labs  Lab 01/16/23 1839 01/16/23 1847 01/17/23 0338  WBC 13.1*  --  9.5  NEUTROABS 11.2*  --   --   HGB 9.3* 10.2* 9.0*  HCT 29.9* 30.0* 28.2*  MCV 92.0  --  91.0  PLT 317  --  123456    Basic Metabolic Panel: Recent Labs  Lab 01/16/23 1839 01/16/23 1847 01/17/23 0338  NA 138 135 138  K 4.0 4.1 4.1  CL 95*  --  96*  CO2 30  --  28  GLUCOSE 155*  --  155*  BUN 44*  --  48*  CREATININE 1.18*  --  1.16*  CALCIUM 8.7*  --   8.3*  MG  --   --  2.7*  PHOS  --   --  4.9*    GFR: Estimated Creatinine Clearance: 31.9 mL/min (A) (by C-G formula based on SCr of 1.16 mg/dL (H)).  Liver Function Tests: Recent Labs  Lab 01/16/23 1839 01/17/23 0338  AST 20 15  ALT 22 21  ALKPHOS 72 62  BILITOT 1.3* 0.4  PROT 6.2* 5.6*  ALBUMIN 3.1* 2.8*   No results for input(s): "LIPASE", "AMYLASE" in the last 168 hours. No results for input(s): "AMMONIA" in the last 168 hours.  Coagulation Profile: Recent Labs  Lab 01/16/23 1839  INR 1.2    Cardiac Enzymes: No results for input(s): "CKTOTAL", "CKMB", "CKMBINDEX", "TROPONINI" in the last 168 hours.  BNP (last 3 results) No results for input(s): "PROBNP" in the last 8760 hours.  Lipid Profile: No results for input(s): "CHOL", "HDL", "LDLCALC", "TRIG", "CHOLHDL", "LDLDIRECT" in the last 72 hours.  Thyroid Function Tests: No results for input(s): "TSH", "T4TOTAL", "FREET4", "T3FREE", "THYROIDAB" in the last 72 hours.  Anemia Panel: No results for input(s): "VITAMINB12", "FOLATE", "FERRITIN", "TIBC", "IRON", "RETICCTPCT" in the last 72 hours.  Urine analysis:    Component Value Date/Time   COLORURINE AMBER (A) 01/16/2023 1826   APPEARANCEUR CLOUDY (A) 01/16/2023 1826   LABSPEC 1.017 01/16/2023 1826   PHURINE 5.0 01/16/2023 1826   GLUCOSEU NEGATIVE 01/16/2023 1826   HGBUR SMALL (A) 01/16/2023 1826   BILIRUBINUR NEGATIVE 01/16/2023 1826   KETONESUR NEGATIVE 01/16/2023 1826   PROTEINUR 30 (A) 01/16/2023 1826   UROBILINOGEN 0.2 03/22/2014 1944   NITRITE NEGATIVE 01/16/2023 1826   LEUKOCYTESUR LARGE (A) 01/16/2023 1826    Sepsis Labs: Lactic Acid, Venous    Component Value Date/Time   LATICACIDVEN 1.0 01/16/2023 1839    MICROBIOLOGY: Recent Results (from the past 240 hour(s))  Blood Culture (routine x 2)     Status: None (Preliminary result)   Collection Time: 01/16/23  6:39 PM   Specimen: BLOOD RIGHT FOREARM  Result Value Ref Range Status    Specimen Description BLOOD RIGHT FOREARM  Final   Special Requests   Final    BOTTLES DRAWN AEROBIC AND ANAEROBIC Blood Culture adequate volume   Culture   Final    NO GROWTH < 12 HOURS Performed at [facility], [place] [address] [place], [place] [address]    Report Status PENDING  Incomplete  Blood Culture (routine x 2)     Status: None (Preliminary result)   Collection Time: 01/16/23  6:42 PM   Specimen: BLOOD LEFT ARM  Result Value Ref Range Status   Specimen Description BLOOD LEFT ARM  Final   Special Requests   Final    BOTTLES DRAWN AEROBIC AND ANAEROBIC Blood Culture results may not be optimal due to an inadequate volume of blood received in culture bottles   Culture   Final    NO GROWTH < 12 HOURS Performed at [facility], [place] [address], [place], [place] [address]    Report Status PENDING  Incomplete    RADIOLOGY STUDIES/RESULTS: DG Chest Port 1 View  Result Date: 01/16/2023 CLINICAL DATA:  Question of sepsis. Evaluate for abnormality. Shortness of breath. Diagnosed with pneumonia today. EXAM: PORTABLE CHEST 1 VIEW COMPARISON:  01/01/2023 FINDINGS: Moderate left pleural effusion with consolidation and volume loss in the left lung, likely pneumonia. Changes are progressing since the previous study. Small right pleural effusion. Heart size is enlarged. No pneumothorax. Calcified and tortuous aorta. Degenerative changes in the spine and shoulders. IMPRESSION: Moderate left pleural effusion with consolidation in the left lung, increasing since prior study. Electronically Signed   By: Lucienne Capers M.D.   On: 01/16/2023 19:09     LOS: 1 day   Oren Binet, MD  [facility]    To contact the attending provider between 7A-7P or the covering provider during after hours 7P-7A, please log into the web site [email or web address] and access using universal [place] password for that web site. If you do not have the password, please call the hospital  operator.  01/17/2023, 9:38 AM

## 2023-01-17 NOTE — Progress Notes (Signed)
PT Cancellation Note  Patient Details Name: Dawn Lawson MRN: [id number] DOB: [birth year]   Cancelled Treatment:    Reason Eval/Treat Not Completed: Patient not medically ready. Pt currently on BiPAP. PT will hold and follow up for evaluation as available/appropriate. Thank you.    Luvenia Heller 01/17/2023, 8:55 AM

## 2023-01-17 NOTE — Progress Notes (Signed)
RT transported pt from ED to [place] room 20.

## 2023-01-17 NOTE — Evaluation (Signed)
Clinical/Bedside Swallow Evaluation Patient Details  Name: Dawn Lawson MRN: [id number] Date of Birth: 1929-10-09  Today's Date: 01/17/2023 Time: SLP Start Time (ACUTE ONLY): [id number] SLP Stop Time (ACUTE ONLY): 1456 SLP Time Calculation (min) (ACUTE ONLY): 13 min  Past Medical History:  Past Medical History:  Diagnosis Date   AKI (acute kidney injury) ([place]) 03/22/2014   Anemia    Arthritis    ATN (acute tubular necrosis) ([place]) 03/22/2014   Cataract    Heart murmur    Hypertension    Lactic acidosis 03/22/2014   PONV (postoperative nausea and vomiting)    Pre-diabetes    Sepsis due to group B Streptococcus ([place]) 04/14/2014   Severe sepsis with acute organ dysfunction ([place]) 03/22/2014   Status post PICC central line placement 04/14/2014   Urinary tract infection 03/22/2014   Past Surgical History:  Past Surgical History:  Procedure Laterality Date   ABDOMINAL HYSTERECTOMY     partial   APPENDECTOMY     APPLICATION OF WOUND VAC Right 09/18/2016   Procedure: APPLICATION OF WOUND VAC;  Surgeon: Rod Can, MD;  Location: [place];  Service: Orthopedics;  Laterality: Right;   EXCISIONAL TOTAL KNEE ARTHROPLASTY WITH ANTIBIOTIC SPACERS Right 09/18/2016   Procedure: RESECTION OF RIGHT TOTAL KNEE ARTHROPLASTY PLACEMENT OF ANTIBIOTIC SPACER;  Surgeon: Rod Can, MD;  Location: [place];  Service: Orthopedics;  Laterality: Right;   EYE SURGERY     Lens implants   JOINT REPLACEMENT Right    KNEE   KNEE HARDWARE REMOVAL Right 11/24/2016   RECTOCELE REPAIR     TEE WITHOUT CARDIOVERSION N/A 04/09/2014   Procedure: TRANSESOPHAGEAL ECHOCARDIOGRAM (TEE);  Surgeon: Lelon Perla, MD;  Location: [facility] ENDOSCOPY;  Service: Cardiovascular;  Laterality: N/A;   TOTAL KNEE ARTHROPLASTY WITH REVISION COMPONENTS Right 11/24/2016   Procedure: Removal of right knee spacer, Right knee arthrodesis;  Surgeon: Rod Can, MD;  Location: [place];  Service: Orthopedics;  Laterality: Right;   HPI:  Pt is a 89  y.o. female who presented with acute respiratory distress and associated with progressive cough. CXR 3/26: Moderate left pleural effusion with consolidation in the left lung,  increasing since study 01/01/23. Pt recently admitted and diagnosed with acute CHF and new onset A-fib. PMH: prediabetes, hyperlipidemia, hypertension, physical debility, obesity, arthritis, HFpEF with grade 2 diastolic dysfunction.    Assessment / Plan / Recommendation  Clinical Impression  Pt was seen for bedside swallow evaluation with her son and daughter-in-law present. Pt's family reported that the pt has been coughing with p.o. intake for the past 6 days and that she has appeared to be more symptomatic with harder solids. However, on Monday, 3/25, she was able to consume softer foods and thin liquids via straw without any coughing. Pt was lethargic during the evaluation and therefore unable to participate in a complete oral mechanism exam, but full dentures were noted. Pt's alertness improved with verbal and tactile stimulation, but she exhibited difficulty maintaining the improved alertness. Minimal lingual manipulation was noted with ice chips, but these and puree boluses ultimately spilled from the oral cavity. It is recommended that the pt's NPO status be maintained and SLP will follow to assess improvement. SLP Visit Diagnosis: Dysphagia, unspecified (R13.10)    Aspiration Risk  Mild aspiration risk    Diet Recommendation NPO   Medication Administration: Via alternative means    Other  Recommendations Oral Care Recommendations: Oral care QID    Recommendations for follow up therapy are one component of a multi-disciplinary  discharge planning process, led by the attending physician.  Recommendations may be updated based on patient status, additional functional criteria and insurance authorization.  Follow up Recommendations  (TBD)      Assistance Recommended at Discharge    Functional Status Assessment Patient  has had a recent decline in their functional status and demonstrates the ability to make significant improvements in function in a reasonable and predictable amount of time.  Frequency and Duration min 2x/week  2 weeks       Prognosis Prognosis for improved oropharyngeal function: Good Barriers to Reach Goals: Severity of deficits      Swallow Study   General Date of Onset: 01/10/23 HPI: Pt is a 89 y.o. female who presented with acute respiratory distress and associated with progressive cough. CXR 3/26: Moderate left pleural effusion with consolidation in the left lung,  increasing since study 01/01/23. Pt recently admitted and diagnosed with acute CHF and new onset A-fib. PMH: prediabetes, hyperlipidemia, hypertension, physical debility, obesity, arthritis, HFpEF with grade 2 diastolic dysfunction. Type of Study: Bedside Swallow Evaluation Previous Swallow Assessment: none Diet Prior to this Study: NPO Temperature Spikes Noted: No Respiratory Status: Nasal cannula History of Recent Intubation: No Behavior/Cognition: Alert;Cooperative;Pleasant mood Oral Cavity Assessment: Within Functional Limits Oral Care Completed by SLP: No Oral Cavity - Dentition: Adequate natural dentition Vision: Functional for self-feeding Self-Feeding Abilities: Able to feed self Patient Positioning: Upright in bed Baseline Vocal Quality: Normal Volitional Swallow: Able to elicit    Oral/Motor/Sensory Function Overall Oral Motor/Sensory Function:  (Unable to assess)   Ice Chips Ice chips: Impaired Presentation: Spoon Oral Phase Impairments: Reduced labial seal;Poor awareness of bolus Oral Phase Functional Implications: Right anterior spillage;Left anterior spillage   Thin Liquid Thin Liquid: Not tested    Nectar Thick Nectar Thick Liquid: Not tested   Honey Thick Honey Thick Liquid: Not tested   Puree Puree: Impaired Presentation: Spoon Oral Phase Impairments: Poor awareness of bolus;Reduced labial  seal   Solid     Solid: Not tested     Jolette Lana I. Hardin Negus, [place], [place] Office number [phone]  Horton Marshall 01/17/2023,3:16 PM

## 2023-01-17 NOTE — Progress Notes (Signed)
PT Cancellation Note  Patient Details Name: Dawn Lawson MRN: [id number] DOB: 1929-07-06   Cancelled Treatment:    Reason Eval/Treat Not Completed: Patient's level of consciousness. Attempted to follow up with pt for evaluation but family reporting pt very lethargic during speech evaluation recently, requesting to return later. Acute PT will continue to follow for assessment as appropriate/available. Thank you.   Luvenia Heller 01/17/2023, 3:39 PM

## 2023-01-17 NOTE — Progress Notes (Signed)
Pt taken off bipap and placed on 8L HFNC at this time. Pt in no distress. VS WNL. RT will Closely monitor pt for ability to wean O2 down.

## 2023-01-18 DIAGNOSIS — I5033 Acute on chronic diastolic (congestive) heart failure: Secondary | ICD-10-CM | POA: Diagnosis not present

## 2023-01-18 DIAGNOSIS — N1832 Chronic kidney disease, stage 3b: Secondary | ICD-10-CM | POA: Diagnosis not present

## 2023-01-18 DIAGNOSIS — J189 Pneumonia, unspecified organism: Secondary | ICD-10-CM | POA: Diagnosis not present

## 2023-01-18 DIAGNOSIS — J9621 Acute and chronic respiratory failure with hypoxia: Secondary | ICD-10-CM | POA: Diagnosis not present

## 2023-01-18 LAB — BASIC METABOLIC PANEL
Anion gap: 15 (ref 5–15)
BUN: 53 mg/dL — ABNORMAL HIGH (ref 8–23)
CO2: 29 mmol/L (ref 22–32)
Calcium: 8.3 mg/dL — ABNORMAL LOW (ref 8.9–10.3)
Chloride: 96 mmol/L — ABNORMAL LOW (ref 98–111)
Creatinine, Ser: 1.42 mg/dL — ABNORMAL HIGH (ref 0.44–1.00)
GFR, Estimated: 35 mL/min — ABNORMAL LOW (ref >60)
Glucose, Bld: 97 mg/dL (ref 70–99)
Potassium: 4.3 mmol/L (ref 3.5–5.1)
Sodium: 140 mmol/L (ref 135–145)

## 2023-01-18 LAB — PROCALCITONIN: Procalcitonin: <0.1 ng/mL

## 2023-01-18 LAB — HEMOGLOBIN A1C
Hgb A1c MFr Bld: 6.2 % — ABNORMAL HIGH (ref 4.8–5.6)
Mean Plasma Glucose: 131 mg/dL

## 2023-01-18 MED ORDER — AZITHROMYCIN 500 MG PO TABS
500.0000 mg | ORAL_TABLET | Freq: Every day | ORAL | Status: AC
  Administered 2023-01-18 – 2023-01-20 (×3): 500 mg via ORAL
  Filled 2023-01-18 (×3): qty 1

## 2023-01-18 NOTE — NC FL2 (Signed)
[place] LEVEL OF CARE FORM     IDENTIFICATION  Patient Name: STARLYNN ERMEL Birthdate: 10/01/1929 Sex: female Admission Date (Current Location): 01/16/2023  [facility] and [place] Number:  Herbalist and Address:  [facility], [place] [address], [place],  [address]      Provider Number: [id number]  Attending Physician Name and Address:  Jonetta Osgood, MD  Relative Name and Phone Number:       Current Level of Care: Hospital Recommended Level of Care: [place] Prior Approval Number:    Date Approved/Denied:   PASRR Number: [id number]  Discharge Plan: SNF    Current Diagnoses: Patient Active Problem List   Diagnosis Date Noted   CAP (community acquired pneumonia) 01/16/2023   Atrial fibrillation with slow ventricular response ([place]) 01/08/2023   Hematoma [id number]   Acute diastolic heart failure ([place]) 01/04/2023   CHF (congestive heart failure) ([place]) 01/01/2023   Weakness 12/12/2022   Prediabetes 08/19/2019   Mixed hyperlipidemia 08/19/2019   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   h/o Osteomyelitis ([place])- R leg-  07/16/2019   Class 3 severe obesity with serious comorbidity in adult [facility]) 07/16/2019   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, nodules and fibrosis 07/16/2019   Difficulty transferring from chair to toilet 07/16/2019   Family history of colon cancer in mother- died age 42; patient refuses colonoscopy 07/16/2019   Colonoscopy refused 07/16/2019   Vaccine refused by patient- flu and others 07/16/2019   Medically noncompliant 07/16/2019   Hypertensive crisis 07/16/2019   Hypertensive disorder 12/04/2017   Abscess of right thigh 02/22/2017   Macular degeneration 10/14/2014   Vitamin D deficiency 10/14/2014   HTN (hypertension) 10/14/2014   Anemia 10/14/2014   Arthritis 10/14/2014    Orientation RESPIRATION BLADDER Height & Weight     Self, Time, Situation,  Place  O2 (4L Nasal cannula) Incontinent Weight:   Height:     BEHAVIORAL SYMPTOMS/MOOD NEUROLOGICAL BOWEL NUTRITION STATUS      Continent Diet (See DC Summary)  AMBULATORY STATUS COMMUNICATION OF NEEDS Skin   Extensive Assist Verbally Normal                       Personal Care Assistance Level of Assistance  Bathing, Feeding, Dressing Bathing Assistance: Maximum assistance Feeding assistance: Limited assistance Dressing Assistance: Maximum assistance     Functional Limitations Info  Sight Sight Info: Impaired        SPECIAL CARE FACTORS FREQUENCY  PT (By licensed PT), OT (By licensed OT)     PT Frequency: 5x/week OT Frequency: 5x/week            Contractures Contractures Info: Not present    Additional Factors Info  Code Status, Allergies Code Status Info: DNR Allergies Info: Penicillins, Chocolate, Tape, Ampicillin           Current Medications (01/18/2023):  This is the current hospital active medication list Current Facility-Administered Medications  Medication Dose Route Frequency Provider Last Rate Last Admin   acetaminophen (TYLENOL) tablet 650 mg  650 mg Oral Q6H PRN Kayleen Memos, DO       azithromycin (ZITHROMAX) tablet 500 mg  500 mg Oral Daily Donnamae Jude, RPH   500 mg at 01/18/23 1423   cefTRIAXone (ROCEPHIN) 2 g in sodium chloride 0.9 % 100 mL IVPB  2 g Intravenous Q24H Irene Pap N, DO 200 mL/hr at 01/17/23 1750 2 g at 01/17/23 1750  furosemide (LASIX) injection 40 mg  40 mg Intravenous BID Jonetta Osgood, MD   40 mg at 01/18/23 0914   ipratropium-albuterol (DUONEB) 0.5-2.5 (3) MG/3ML nebulizer solution 3 mL  3 mL Nebulization Q6H Hall, Carole N, DO   3 mL at 01/18/23 1305   polyethylene glycol (MIRALAX / GLYCOLAX) packet 17 g  17 g Oral Daily PRN Irene Pap N, DO       prochlorperazine (COMPAZINE) injection 5 mg  5 mg Intravenous Q6H PRN Kayleen Memos, DO         Discharge Medications: Please see discharge summary for a list  of discharge medications.  Relevant Imaging Results:  Relevant Lab Results:   Additional Information SSN: [id number]  Benard Halsted, LCSW

## 2023-01-18 NOTE — Plan of Care (Signed)

## 2023-01-18 NOTE — Progress Notes (Signed)
PROGRESS NOTE        PATIENT DETAILS Name: Dawn Lawson Age: 89 y.o. Sex: female Date of Birth: 06-19-1929 Admit Date: 01/16/2023 Admitting Physician Kayleen Memos, DO [id number], [name], NP  Brief Summary: Patient is a 89 y.o.  female with history of chronic HFpEF, atrial fibrillation-not on anticoagulation due to recent left thigh hematoma, HTN, history of prior right knee prosthetic infection, chronic disability-just to SNF-presented to hospital with shortness of breath-found to have acute hypoxic respiratory failure due to combination of aspiration pneumonia and acute on chronic HFrEF.  See below for further details.  Significant events: 3/11-3/18>> hospitalization for HFrEF/respiratory failure-left thigh hematoma. 3/26>> admit to [facility]-severe hypoxia requiring BiPAP-aspiration PNA/HFpEF exacerbation. 3/27>> liberated off BiPAP 3/27>> on 2 L of [place]  Significant studies: 3/26>> CXR: Left lung consolidation/moderate pleural effusion.  Significant microbiology data: 3/26>> blood culture: No growth  Procedures: None  Consults: None  Subjective: No major issues overnight-on 2 L of oxygen.  Awake/alert.  Objective: Vitals: Blood pressure (!) 107/47, pulse 87, temperature (!) 97.4 F (36.3 C), temperature source Oral, resp. rate (!) 26, SpO2 100 %.   Exam: Gen Exam:Alert awake-not in any distress HEENT:atraumatic, normocephalic Chest: B/L clear to auscultation anteriorly CVS:S1S2 regular Abdomen:soft non tender, non distended Extremities:++ edema Neurology: Non focal Skin: no rash  Pertinent Labs/Radiology:    Latest Ref Rng & Units 01/17/2023    3:38 AM 01/16/2023    6:47 PM 01/16/2023    6:39 PM  CBC  WBC 4.0 - 10.5 K/uL 9.5   13.1   Hemoglobin 12.0 - 15.0 g/dL 9.0  10.2  9.3   Hematocrit 36.0 - 46.0 % 28.2  30.0  29.9   Platelets 150 - 400 K/uL 253   317     Lab Results  Component Value Date   NA 140 01/18/2023   K 4.3  01/18/2023   CL 96 (L) 01/18/2023   CO2 29 01/18/2023      Assessment/Plan: Acute hypoxic respiratory failure likely due to aspiration pneumonia and HFpEF exacerbation Suspect this was more from HFpEF exacerbation then aspiration pneumonia Significantly better after aggressive diuresis and IV antibiotics Stable on just 2 L of oxygen.   Aspiration pneumonia Continue IV antibiotics Cultures negative so far Appreciate SLP input Clinically significantly better today-as noted above-suspect hypoxia was more from HFpEF at this point.   Acute on chronic HFpEF Mostly volume overloaded-mild jump in creatinine-but will continue IV Lasix for another day Reassess 3/29 Of asked RN to see if we can do daily weights/intake/output.  Left-sided pleural effusion Chronic issue-seen in prior imaging studies as well I suspect this is from CHF-likely a transudate Do not think there is any benefit from pursuing thoracocentesis at this point-her hypoxia has improved-she is frail/elderly-and thoracocentesis is likely not going to provide any therapeutic benefit.  No clinical indication that this is a empyema or from exudative etiology.   Persistent atrial fibrillation Rate controlled without the use of any medications Not a candidate for anticoagulation-due to advanced age-and due to recent left thigh hematoma  Left thigh hematoma Stable left leg exam from prior hospitalization-some ecchymosis still persist Supportive care at this point.  HTN All antihypertensives were discontinued last hospitalization BP appears reasonable-will follow closely.  Normocytic anemia Due to acute illness/recent left thigh hematoma Follow Hb periodically.  CKD stage IIIb Slight elevation of  creatinine-but remains grossly volume overloaded-continue IV Lasix  Palliative care Frail elderly patient-now with aspiration pneumonia and resultant severe hypoxic respiratory failure Son at bedside-family aware that she has  improved but her overall prognosis is still poor. Continue to mobilize with PT/OT-hopefully we can get her back to SNF over the weekend.  Obesity: Estimated body mass index is 34.15 kg/m as calculated from the following:   Height as of 01/01/23: 5\' 2"  (1.575 m).   Weight as of 01/08/23: 84.7 kg.   Code status:   Code Status: DNR   DVT Prophylaxis: SCDs Start: 01/17/23 0009    Family Communication: Son at bedside   Disposition Plan: Status is: Inpatient Remains inpatient appropriate because: Severity of illness   Planned Discharge Destination:Skilled nursing facility   Diet: Diet Order             Diet regular Room service appropriate? No; Fluid consistency: Thin  Diet effective now                     Antimicrobial agents: Anti-infectives (From admission, onward)    Start     Dose/Rate Route Frequency Ordered Stop   01/17/23 1800  cefTRIAXone (ROCEPHIN) 2 g in sodium chloride 0.9 % 100 mL IVPB        2 g 200 mL/hr over 30 Minutes Intravenous Every 24 hours 01/16/23 2109     01/17/23 1800  azithromycin (ZITHROMAX) 500 mg in sodium chloride 0.9 % 250 mL IVPB        500 mg 250 mL/hr  Intravenous Every 24 hours 01/16/23 2109     01/16/23 1830  cefTRIAXone (ROCEPHIN) 1 g in sodium chloride 0.9 % 100 mL IVPB        1 g 200 mL/hr over 30 Minutes Intravenous  Once 01/16/23 1826 01/16/23 1930   01/16/23 1830  azithromycin (ZITHROMAX) 500 mg in sodium chloride 0.9 % 250 mL IVPB        500 mg 250 mL/hr over 60 Minutes Intravenous  Once 01/16/23 1826 01/16/23 2020        MEDICATIONS: Scheduled Meds:  furosemide  40 mg Intravenous BID   ipratropium-albuterol  3 mL Nebulization Q6H   Continuous Infusions:  azithromycin (ZITHROMAX) 500 mg in sodium chloride 0.9 % 250 mL IVPB 500 mg (01/17/23 1830)   cefTRIAXone (ROCEPHIN)  IV 2 g (01/17/23 1750)   PRN Meds:.acetaminophen, polyethylene glycol, prochlorperazine   I have personally reviewed following labs and  imaging studies  LABORATORY DATA: CBC: Recent Labs  Lab 01/16/23 1839 01/16/23 1847 01/17/23 0338  WBC 13.1*  --  9.5  NEUTROABS 11.2*  --   --   HGB 9.3* 10.2* 9.0*  HCT 29.9* 30.0* 28.2*  MCV 92.0  --  91.0  PLT 317  --  253     Basic Metabolic Panel: Recent Labs  Lab 01/16/23 1839 01/16/23 1847 01/17/23 0338 01/18/23 0433  NA 138 135 138 140  K 4.0 4.1 4.1 4.3  CL 95*  --  96* 96*  CO2 30  --  28 29  GLUCOSE 155*  --  155* 97  BUN 44*  --  48* 53*  CREATININE 1.18*  --  1.16* 1.42*  CALCIUM 8.7*  --  8.3* 8.3*  MG  --   --  2.7*  --   PHOS  --   --  4.9*  --      GFR: Estimated Creatinine Clearance: 26 mL/min (A) (by C-G formula based on  SCr of 1.42 mg/dL (H)).  Liver Function Tests: Recent Labs  Lab 01/16/23 1839 01/17/23 0338  AST 20 15  ALT 22 21  ALKPHOS 72 62  BILITOT 1.3* 0.4  PROT 6.2* 5.6*  ALBUMIN 3.1* 2.8*    No results for input(s): "LIPASE", "AMYLASE" in the last 168 hours. No results for input(s): "AMMONIA" in the last 168 hours.  Coagulation Profile: Recent Labs  Lab 01/16/23 1839  INR 1.2     Cardiac Enzymes: No results for input(s): "CKTOTAL", "CKMB", "CKMBINDEX", "TROPONINI" in the last 168 hours.  BNP (last 3 results) No results for input(s): "PROBNP" in the last 8760 hours.  Lipid Profile: No results for input(s): "CHOL", "HDL", "LDLCALC", "TRIG", "CHOLHDL", "LDLDIRECT" in the last 72 hours.  Thyroid Function Tests: No results for input(s): "TSH", "T4TOTAL", "FREET4", "T3FREE", "THYROIDAB" in the last 72 hours.  Anemia Panel: No results for input(s): "VITAMINB12", "FOLATE", "FERRITIN", "TIBC", "IRON", "RETICCTPCT" in the last 72 hours.  Urine analysis:    Component Value Date/Time   COLORURINE AMBER (A) 01/16/2023 1826   APPEARANCEUR CLOUDY (A) 01/16/2023 1826   LABSPEC 1.017 01/16/2023 1826   PHURINE 5.0 01/16/2023 1826   GLUCOSEU NEGATIVE 01/16/2023 1826   HGBUR SMALL (A) 01/16/2023 1826   BILIRUBINUR  NEGATIVE 01/16/2023 1826   KETONESUR NEGATIVE 01/16/2023 1826   PROTEINUR 30 (A) 01/16/2023 1826   UROBILINOGEN 0.2 03/22/2014 1944   NITRITE NEGATIVE 01/16/2023 1826   LEUKOCYTESUR LARGE (A) 01/16/2023 1826    Sepsis Labs: Lactic Acid, Venous    Component Value Date/Time   LATICACIDVEN 1.0 01/16/2023 1839    MICROBIOLOGY: Recent Results (from the past 240 hour(s))  Blood Culture (routine x 2)     Status: None (Preliminary result)   Collection Time: 01/16/23  6:39 PM   Specimen: BLOOD RIGHT FOREARM  Result Value Ref Range Status   Specimen Description BLOOD RIGHT FOREARM  Final   Special Requests   Final    BOTTLES DRAWN AEROBIC AND ANAEROBIC Blood Culture adequate volume   Culture   Final    NO GROWTH 2 DAYS Performed at [facility], [place] [address] [place], [place] [address]    Report Status PENDING  Incomplete  Blood Culture (routine x 2)     Status: None (Preliminary result)   Collection Time: 01/16/23  6:42 PM   Specimen: BLOOD LEFT ARM  Result Value Ref Range Status   Specimen Description BLOOD LEFT ARM  Final   Special Requests   Final    BOTTLES DRAWN AEROBIC AND ANAEROBIC Blood Culture results may not be optimal due to an inadequate volume of blood received in culture bottles   Culture   Final    NO GROWTH 2 DAYS Performed at [facility], [place] [address], [place], [place] [address]    Report Status PENDING  Incomplete    RADIOLOGY STUDIES/RESULTS: VAS [place] LOWER EXTREMITY VENOUS (DVT)  Result Date: 01/17/2023  Lower Venous DVT Study Patient Name:  Dawn Lawson  Date of Exam:   01/17/2023 Medical Rec #: [id number]       Accession #:    [id number] Date of Birth: 06-12-1929       Patient Gender: F Patient Age:   10 years Exam Location:  [facility] Procedure:      VAS [place] LOWER EXTREMITY VENOUS (DVT) Referring Phys: Archie Patten HALL --------------------------------------------------------------------------------  Indications: Swelling, and Bruising and  large lump LLE.  Comparison Study: Previous RLE 10/12/14 negative. Performing Technologist: Leana Roe  Maag RVT, VT  Examination Guidelines: A complete evaluation includes B-mode imaging, spectral Doppler, color Doppler, and power Doppler as needed of all accessible portions of each vessel. Bilateral testing is considered an integral part of a complete examination. Limited examinations for reoccurring indications may be performed as noted. The reflux portion of the exam is performed with the patient in reverse Trendelenburg.  +--------+---------------+---------+-----------+----------+--------------------+ RIGHT   CompressibilityPhasicitySpontaneityPropertiesThrombus Aging       +--------+---------------+---------+-----------+----------+--------------------+ CFV     Full           Yes      Yes                                       +--------+---------------+---------+-----------+----------+--------------------+ SFJ     Full                                                              +--------+---------------+---------+-----------+----------+--------------------+ FV Prox Full                                                              +--------+---------------+---------+-----------+----------+--------------------+ FV Mid                 Yes      Yes                  patent by color                                                           doppler.             +--------+---------------+---------+-----------+----------+--------------------+ FV                     Yes      Yes                  patent by color      Distal                                               doppler.             +--------+---------------+---------+-----------+----------+--------------------+ PFV     Full                                                              +--------+---------------+---------+-----------+----------+--------------------+ POP  Not visualized       +--------+---------------+---------+-----------+----------+--------------------+ PTV     Full                                                              +--------+---------------+---------+-----------+----------+--------------------+ PERO    Full                                         Not well visualized  +--------+---------------+---------+-----------+----------+--------------------+   +---------+---------------+---------+-----------+----------+-------------------+ LEFT     CompressibilityPhasicitySpontaneityPropertiesThrombus Aging      +---------+---------------+---------+-----------+----------+-------------------+ CFV      Full           Yes      Yes                                      +---------+---------------+---------+-----------+----------+-------------------+ SFJ      Full                                                             +---------+---------------+---------+-----------+----------+-------------------+ FV Prox  Full                                                             +---------+---------------+---------+-----------+----------+-------------------+ FV Mid   Full                                                             +---------+---------------+---------+-----------+----------+-------------------+ FV DistalFull                                                             +---------+---------------+---------+-----------+----------+-------------------+ PFV      Full                                                             +---------+---------------+---------+-----------+----------+-------------------+ POP      Full           Yes      Yes                                      +---------+---------------+---------+-----------+----------+-------------------+ PTV  Full                                                              +---------+---------------+---------+-----------+----------+-------------------+ PERO     Full                                         Not well visualized +---------+---------------+---------+-----------+----------+-------------------+ Non-vascularized area is noted and measured left distal medial thigh.    Summary: RIGHT: - There is no evidence of deep vein thrombosis in the lower extremity. However, portions of this examination were limited- see technologist comments above.  - No cystic structure found in the popliteal fossa. - Ultrasound characteristics of enlarged lymph nodes are noted in the groin.  LEFT: - There is no evidence of deep vein thrombosis in the lower extremity. However, portions of this examination were limited- see technologist comments above.  - No cystic structure found in the popliteal fossa. - Non-vascularized area is noted and measured left distal medial thigh.  *See table(s) above for measurements and observations. Electronically signed by Deitra Mayo MD on 01/17/2023 at 2:27:50 PM.    Final    DG Chest Port 1 View  Result Date: 01/16/2023 CLINICAL DATA:  Question of sepsis. Evaluate for abnormality. Shortness of breath. Diagnosed with pneumonia today. EXAM: PORTABLE CHEST 1 VIEW COMPARISON:  01/01/2023 FINDINGS: Moderate left pleural effusion with consolidation and volume loss in the left lung, likely pneumonia. Changes are progressing since the previous study. Small right pleural effusion. Heart size is enlarged. No pneumothorax. Calcified and tortuous aorta. Degenerative changes in the spine and shoulders. IMPRESSION: Moderate left pleural effusion with consolidation in the left lung, increasing since prior study. Electronically Signed   By: Lucienne Capers M.D.   On: 01/16/2023 19:09     LOS: 2 days   Oren Binet, MD  [facility]    To contact the attending provider between 7A-7P or the covering provider during after hours 7P-7A, please log into  the web site [email or web address] and access using universal [place] password for that web site. If you do not have the password, please call the hospital operator.  01/18/2023, 11:30 AM

## 2023-01-18 NOTE — Progress Notes (Signed)
Emptied external catheter suction container at 0842.  Not sure when this urine occurred overnight or yesterday.  However, it had 250 in it.  Will continue to monitor urine output today.

## 2023-01-18 NOTE — Care Management Important Message (Signed)
Important Message  Patient Details  Name: Dawn Lawson MRN: [id number] Date of Birth: [birth year]   Medicare Important Message Given:  Yes     Orbie Pyo 01/18/2023, 3:22 PM

## 2023-01-18 NOTE — Evaluation (Signed)
Physical Therapy Evaluation Patient Details Name: Dawn Lawson MRN: [id number] DOB: [birth year] Today's Date: 01/18/2023  History of Present Illness  89 yo female diagnosed with acute CHF and new onset of A-fib presenting with acute respiratory distress with a progressive cough on 3/26. PMH HLD HTN obesity arthritis HRpEF with grade 2 diastolic dysfunction cataract, R TKA  Clinical Impression  Pt presents today with impaired functional mobility, limited by strength, balance, cognition, and activity tolerance. Pt was ambulatory earlier this month but was recently receiving PT in a skilled rehab setting. Pt is a poor historian and no family present, history obtained from chart. Pt requiring maxA for bed mobility today, only tolerating sitting EOB ~5 seconds before requesting to return to supine, preferring to remain leaned back and propped on L elbow. Acute PT will continue to follow up with pt as appropriate to progress mobility, recommend return to SNF at discharge to continue therapy and maximize independence with mobility.        Recommendations for follow up therapy are one component of a multi-disciplinary discharge planning process, led by the attending physician.  Recommendations may be updated based on patient status, additional functional criteria and insurance authorization.  Follow Up Recommendations Can patient physically be transported by private vehicle: No     Assistance Recommended at Discharge Frequent or constant Supervision/Assistance  Patient can return home with the following  Two people to help with walking and/or transfers;Help with stairs or ramp for entrance;Assist for transportation;Assistance with cooking/housework    Equipment Recommendations Other (comment) (defer to next level)  Recommendations for Other Services       Functional Status Assessment Patient has had a recent decline in their functional status and demonstrates the ability to make significant  improvements in function in a reasonable and predictable amount of time.     Precautions / Restrictions Precautions Precautions: Fall Precaution Comments: right knee fused, watch O2 Restrictions Weight Bearing Restrictions: No      Mobility  Bed Mobility Overal bed mobility: Needs Assistance Bed Mobility: Supine to Sit, Sit to Supine, Rolling Rolling: Max assist   Supine to sit: Max assist, HOB elevated Sit to supine: Max assist, HOB elevated   General bed mobility comments: rolling to the R for adjustment of bed pad but requiring maxA. Supine<>sit with maxA and HOB elevated, pt primarily propped on L elbow in sitting but able to pull forward for ~5 seconds with maxA to maintain balance, pt requesting to return to supine due to fatigue    Transfers                   General transfer comment: deferred as pt unable to tolerate sitting EOB    Ambulation/Gait               General Gait Details: deferred  Stairs            Wheelchair Mobility    Modified Rankin (Stroke Patients Only)       Balance Overall balance assessment: Needs assistance, History of Falls Sitting-balance support: Bilateral upper extremity supported, Feet unsupported Sitting balance-Leahy Scale: Zero Sitting balance - Comments: leaning on L elbow initially, when pulling into sitting and off of elbow, pt requiring total-maxA to maintain balance, only tolerating ~5 seconds                                     Pertinent Vitals/Pain Pain  Assessment Pain Assessment: No/denies pain    Home Living Family/patient expects to be discharged to:: Private residence Living Arrangements: Alone Available Help at Discharge: Family;Available PRN/intermittently Type of Home: House Home Access: Stairs to enter Entrance Stairs-Rails: Psychiatric nurse of Steps: 2   Home Layout: One level Home Equipment: BSC/3in1;Standard Walker Additional Comments: No family  present during evaluation, history obtained from chart review. Pt was at [facility] prior to this admission as she was recently discharged from [facility] on 3/18. When pt was home, son checked on her 4x/day    Prior Function Prior Level of Function : Independent/Modified Independent             Mobility Comments: per chart review, pt was ambulatory prior to 3/10, utilizes lift chair. Pt providing limited history       Hand Dominance   Dominant Hand: Right    Extremity/Trunk Assessment   Upper Extremity Assessment Upper Extremity Assessment: Defer to OT evaluation    Lower Extremity Assessment Lower Extremity Assessment: RLE deficits/detail;LLE deficits/detail RLE Deficits / Details: R knee fused in extension, reports pain with mild hip flexion, light touch sensation intact but noted edema LLE Deficits / Details: swelling to LLE and significant bruising around knee, active movement in LLE and sensation intact but generalized weakness throughout    Cervical / Trunk Assessment Cervical / Trunk Assessment: Kyphotic  Communication   Communication: No difficulties  Cognition Arousal/Alertness: Awake/alert Behavior During Therapy: WFL for tasks assessed/performed Overall Cognitive Status: History of cognitive impairments - at baseline                                 General Comments: Pt oriented to self and place, able to get month and year with options, no family present during today's session. When asking questions about mobility pt reports "I don't know", often to most questions        General Comments General comments (skin integrity, edema, etc.): SPO2 stable on 5L O2 HFNC, no family present    Exercises     Assessment/Plan    PT Assessment Patient needs continued PT services  PT Problem List Decreased strength;Decreased range of motion;Decreased activity tolerance;Decreased balance;Decreased mobility;Decreased coordination;Decreased knowledge of  use of DME;Decreased knowledge of precautions;Decreased safety awareness;Decreased cognition       PT Treatment Interventions DME instruction;Gait training;Functional mobility training;Therapeutic activities;Therapeutic exercise;Balance training;Neuromuscular re-education;Cognitive remediation;Patient/family education;Wheelchair mobility training    PT Goals (Current goals can be found in the Care Plan section)  Acute Rehab PT Goals Patient Stated Goal: get better PT Goal Formulation: With patient Time For Goal Achievement: 02/01/23 Potential to Achieve Goals: Fair    Frequency Min 2X/week     Co-evaluation               AM-PAC PT "6 Clicks" Mobility  Outcome Measure Help needed turning from your back to your side while in a flat bed without using bedrails?: A Lot Help needed moving from lying on your back to sitting on the side of a flat bed without using bedrails?: Total Help needed moving to and from a bed to a chair (including a wheelchair)?: Total Help needed standing up from a chair using your arms (e.g., wheelchair or bedside chair)?: Total Help needed to walk in hospital room?: Total Help needed climbing 3-5 steps with a railing? : Total 6 Click Score: 7    End of Session Equipment Utilized During Treatment: Oxygen  Activity Tolerance: Patient limited by fatigue Patient left: in bed;with call bell/phone within reach;with bed alarm set Nurse Communication: Mobility status PT Visit Diagnosis: Muscle weakness (generalized) (M62.81);History of falling (Z91.81);Other abnormalities of gait and mobility (R26.89);Difficulty in walking, not elsewhere classified (R26.2)    Time: [id number] PT Time Calculation (min) (ACUTE ONLY): 12 min   Charges:   PT Evaluation $PT Eval Moderate Complexity: 1 Mod          Charlynne Cousins, PT DPT Acute Rehabilitation Services Office [phone]   Luvenia Heller 01/18/2023, 3:14 PM

## 2023-01-18 NOTE — Progress Notes (Signed)
Speech Language Pathology Treatment: Dysphagia  Patient Details Name: Dawn Lawson MRN: [id number] DOB: 03/02/1929 Today's Date: 01/18/2023 Time: [id number] SLP Time Calculation (min) (ACUTE ONLY): 13 min  Assessment / Plan / Recommendation Clinical Impression  Pt was seen for dysphagia treatment with her son and daughter-in-law present. Pt was alert during the session and communicative. She tolerated puree solids, regular texture solids, and thin liquids without overt s/s of aspiration. Mastication and oral clearance were WFL. Pt's son stated that the pt's performance appears to be at her baseline prior the decline during the last week. A regular texture diet with thin liquids is recommended at this time and SLP will follow to ensure tolerance.    HPI HPI: Pt is a 89 y.o. female who presented with acute respiratory distress and associated with progressive cough. CXR 3/26: Moderate left pleural effusion with consolidation in the left lung,  increasing since study 01/01/23. Pt recently admitted and diagnosed with acute CHF and new onset A-fib. PMH: prediabetes, hyperlipidemia, hypertension, physical debility, obesity, arthritis, HFpEF with grade 2 diastolic dysfunction.      SLP Plan  Continue with current plan of care      Recommendations for follow up therapy are one component of a multi-disciplinary discharge planning process, led by the attending physician.  Recommendations may be updated based on patient status, additional functional criteria and insurance authorization.    Recommendations  Diet recommendations: Regular;Thin liquid Liquids provided via: Cup;Straw Medication Administration: Whole meds with liquid Supervision: Patient able to self feed Compensations: Slow rate Postural Changes and/or Swallow Maneuvers: Seated upright 90 degrees                  Oral care BID     Dysphagia, unspecified (R13.10)     Continue with current plan of care   Emberlee Sortino I. Hardin Negus,  [place], [place] Office number [phone]   Horton Marshall  01/18/2023, 11:16 AM

## 2023-01-18 NOTE — Progress Notes (Signed)
Pt on HFNC 3L and stable. Pt not requiring Bipap at this time. Will continue to monitor

## 2023-01-18 NOTE — Progress Notes (Signed)
Occupational Therapy Treatment Patient Details Name: Dawn Lawson MRN: [id number] DOB: [birth year] Today's Date: 01/18/2023   History of present illness 89 yo female diagnosed with acute CHF and new onset of A-fib presenting with acute respiratory distress with a progressive cough. PMH HLD HTN obesity arthritis HRpEF with grade 2 diastolic dysfunction cataract, R TKA   OT comments  Pt. Was seen for skilled OT to maximize I and safety with bed mobility and sitting. Pt. Was Max A with supine to sit and would not come into full sitting. She was weightbearing through L elbow and would not attempt to sit up into midline. Pt. Would lean forward when cued. Pt. Dep to don socks. Acute OT to follow.    Recommendations for follow up therapy are one component of a multi-disciplinary discharge planning process, led by the attending physician.  Recommendations may be updated based on patient status, additional functional criteria and insurance authorization.    Assistance Recommended at Discharge Frequent or constant Supervision/Assistance  Patient can return home with the following  Two people to help with walking and/or transfers;Assistance with cooking/housework;Direct supervision/assist for medications management;Assist for transportation;Direct supervision/assist for financial management;Help with stairs or ramp for entrance;Two people to help with bathing/dressing/bathroom   Equipment Recommendations  BSC/3in1;Hospital bed;Wheelchair cushion (measurements OT);Wheelchair (measurements OT);Other (comment)    Recommendations for Other Services      Precautions / Restrictions Precautions Precautions: Fall Precaution Comments: right knee fused, watch O2 Restrictions Weight Bearing Restrictions: No       Mobility Bed Mobility   Bed Mobility: Sit to Supine     Supine to sit: Max assist, HOB elevated Sit to supine: Max assist        Transfers                         Balance      Sitting balance-Leahy Scale: Zero Sitting balance - Comments: Pt. unwilling to come to full sit. pt was leaning into l elbow.                                   ADL either performed or assessed with clinical judgement   ADL Overall ADL's : Needs assistance/impaired Eating/Feeding: Maximal assistance   Grooming: Maximal assistance   Upper Body Bathing: Maximal assistance   Lower Body Bathing: Total assistance   Upper Body Dressing : Maximal assistance   Lower Body Dressing: Total assistance                 General ADL Comments: bed level for adls.    Extremity/Trunk Assessment Upper Extremity Assessment Upper Extremity Assessment: Generalized weakness            Vision       Perception     Praxis      Cognition Arousal/Alertness: Awake/alert Behavior During Therapy: WFL for tasks assessed/performed Overall Cognitive Status: History of cognitive impairments - at baseline                                          Exercises      Shoulder Instructions       General Comments Pt. sat eob for 10 min but would not come into full sit. Pt. was leaning into L elbow and would with cues lean forward but  was not willing to come off of elbow.    Pertinent Vitals/ Pain       Pain Assessment Pain Assessment: No/denies pain  Home Living                                          Prior Functioning/Environment              Frequency  Min 2X/week        Progress Toward Goals  OT Goals(current goals can now be found in the care plan section)  Progress towards OT goals: Progressing toward goals  Acute Rehab OT Goals Patient Stated Goal: to get stronger OT Goal Formulation: With patient/family Time For Goal Achievement: 01/31/23 Potential to Achieve Goals: Fair ADL Goals Pt Will Perform Grooming: with supervision;sitting Pt Will Perform Upper Body Bathing: with min guard assist;sitting Pt Will  Transfer to Toilet: with max assist;squat pivot transfer;bedside commode Additional ADL Goal #1: pt will tolerate sitting in chair for 5 minutes with stable VSS Additional ADL Goal #2: pt will roll R and L for hoyer pad placement mod (A) and precursor to adl needs for hygiene Additional ADL Goal #3: pt will demonstrate grooming sitting in chair mod (A)  Plan Discharge plan remains appropriate    Co-evaluation                 AM-PAC OT "6 Clicks" Daily Activity     Outcome Measure   Help from another person eating meals?: A Lot Help from another person taking care of personal grooming?: A Lot Help from another person toileting, which includes using toliet, bedpan, or urinal?: Total Help from another person bathing (including washing, rinsing, drying)?: Total Help from another person to put on and taking off regular upper body clothing?: A Lot Help from another person to put on and taking off regular lower body clothing?: Total 6 Click Score: 9    End of Session Equipment Utilized During Treatment: Oxygen  OT Visit Diagnosis: Muscle weakness (generalized) (M62.81)   Activity Tolerance     Patient Left     Nurse Communication          [id number] OT Time Calculation (min): 27 min  Charges: OT General Charges $OT Visit: 1 Visit OT Treatments $Therapeutic Activity: 23-37 mins  Reece Packer OT/L   Dawn Lawson 01/18/2023, 12:29 PM

## 2023-01-18 NOTE — Progress Notes (Signed)
RT note. Patient not requiring bipap at this time.  Patient currently on 5 L HFNC sat 97% with no labored breathing noted at this time. Servo air is on stand by in patients room at this time. RT will continue to monitor and place patient on bipap if needed later.

## 2023-01-19 DIAGNOSIS — I5033 Acute on chronic diastolic (congestive) heart failure: Secondary | ICD-10-CM | POA: Diagnosis not present

## 2023-01-19 DIAGNOSIS — J189 Pneumonia, unspecified organism: Secondary | ICD-10-CM | POA: Diagnosis not present

## 2023-01-19 DIAGNOSIS — N1832 Chronic kidney disease, stage 3b: Secondary | ICD-10-CM | POA: Diagnosis not present

## 2023-01-19 DIAGNOSIS — J9621 Acute and chronic respiratory failure with hypoxia: Secondary | ICD-10-CM | POA: Diagnosis not present

## 2023-01-19 LAB — BASIC METABOLIC PANEL
Anion gap: 12 (ref 5–15)
BUN: 51 mg/dL — ABNORMAL HIGH (ref 8–23)
CO2: 28 mmol/L (ref 22–32)
Calcium: 8.1 mg/dL — ABNORMAL LOW (ref 8.9–10.3)
Chloride: 97 mmol/L — ABNORMAL LOW (ref 98–111)
Creatinine, Ser: 1.58 mg/dL — ABNORMAL HIGH (ref 0.44–1.00)
GFR, Estimated: 31 mL/min — ABNORMAL LOW (ref >60)
Glucose, Bld: 114 mg/dL — ABNORMAL HIGH (ref 70–99)
Potassium: 3.6 mmol/L (ref 3.5–5.1)
Sodium: 137 mmol/L (ref 135–145)

## 2023-01-19 MED ORDER — SALINE SPRAY 0.65 % NA SOLN
1.0000 | NASAL | Status: DC | PRN
  Administered 2023-01-19 – 2023-01-21 (×2): 1 via NASAL
  Filled 2023-01-19: qty 44

## 2023-01-19 MED ORDER — WHITE PETROLATUM EX OINT
TOPICAL_OINTMENT | CUTANEOUS | Status: DC | PRN
  Administered 2023-01-19: 0.2 via TOPICAL
  Filled 2023-01-19: qty 28.35

## 2023-01-19 MED ORDER — FUROSEMIDE 40 MG PO TABS
40.0000 mg | ORAL_TABLET | Freq: Once | ORAL | Status: AC
  Administered 2023-01-19: 40 mg via ORAL
  Filled 2023-01-19: qty 1

## 2023-01-19 MED ORDER — PHENYLEPHRINE HCL 0.5 % NA SOLN
1.0000 [drp] | Freq: Four times a day (QID) | NASAL | Status: AC | PRN
  Filled 2023-01-19: qty 15

## 2023-01-19 MED ORDER — POTASSIUM CHLORIDE CRYS ER 20 MEQ PO TBCR
20.0000 meq | EXTENDED_RELEASE_TABLET | Freq: Every day | ORAL | Status: DC
  Administered 2023-01-19 – 2023-01-22 (×4): 20 meq via ORAL
  Filled 2023-01-19 (×4): qty 1

## 2023-01-19 NOTE — TOC Initial Note (Addendum)
Transition of Care [facility]) - Initial/Assessment Note    Patient Details  Name: Dawn Lawson MRN: [id number] Date of Birth: 03-14-1929  [facility]) CM/SW Contact:    Benard Halsted, LCSW Phone Number: 01/19/2023, 9:59 AM  Clinical Narrative:                 9:59am-CSW spoke with patient's son regarding discharge back to [facility] when stable. Son stated that he would rather her go to a different facility as he does not feel staff were giving her medications appropriately which is how she ended up at the hospital. CSW explained Medicare coverage for SNF and that patient will have up to 2 more weeks at SNF covered by Medicare at the 100% rate since she has not had a 60 day wellness period. CSW will conduct SNF bed search to see if any other facility could accept patient over the weekend if stable for discharge.   1:21pm-CSW provided SNF bed offers to son for potential discharge tomorrow if stable. Per son, he does not feel comfortable with patient discharging tomorrow. CSW will follow; unsure of what holiday acceptances will be at [facility] on Sunday.   Expected Discharge Plan: Skilled Nursing Facility Barriers to Discharge: Continued Medical Work up, SNF Pending bed offer   Patient Goals and CMS Choice Patient states their goals for this hospitalization and ongoing recovery are:: Rehab CMS Medicare.gov Compare Post Acute Care list provided to:: Patient Represenative (must comment) Choice offered to / list presented to : Adult [place] ownership interest in [facility]provided to:: Adult Children    Expected Discharge Plan and Services In-house Referral: Clinical Social Work   Post Acute Care Choice: [place] Living arrangements for the past 2 months: [place]                                      Prior Living Arrangements/Services Living arrangements for the past 2 months: Single Family Home Lives with::  Self Patient language and need for interpreter reviewed:: Yes Do you feel safe going back to the place where you live?: Yes      Need for Family Participation in Patient Care: Yes (Comment) Care giver support system in place?: Yes (comment)   Criminal Activity/Legal Involvement Pertinent to Current Situation/Hospitalization: No - Comment as needed  Activities of Daily Living      Permission Sought/Granted Permission sought to share information with : Facility Sport and exercise psychologist, Family Supports Permission granted to share information with : Yes, Verbal Permission Granted  Share Information with NAME: Kasandra Knudsen  Permission granted to share info w AGENCY: SNFs  Permission granted to share info w Relationship: Son  Permission granted to share info w Contact Information: [phone]  Emotional Assessment Appearance:: Appears stated age Attitude/Demeanor/Rapport: Unable to Assess Affect (typically observed): Unable to Assess Orientation: : Oriented to Self, Oriented to Place Alcohol / Substance Use: Not Applicable Psych Involvement: No (comment)  Admission diagnosis:  CAP (community acquired pneumonia) [J18.9] Acute on chronic respiratory failure with hypoxia [J96.21] Patient Active Problem List   Diagnosis Date Noted   CAP (community acquired pneumonia) 01/16/2023   Atrial fibrillation with slow ventricular response ([place]) 01/08/2023   Hematoma [id number]   Acute diastolic heart failure ([place]) 01/04/2023   CHF (congestive heart failure) ([place]) 01/01/2023   Weakness 12/12/2022   Prediabetes 08/19/2019   Mixed hyperlipidemia 08/19/2019  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   h/o Osteomyelitis ([place])- R leg-  07/16/2019   Class 3 severe obesity with serious comorbidity in adult [facility]) 07/16/2019   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, nodules and fibrosis 07/16/2019   Difficulty transferring from chair to toilet 07/16/2019   Family history of colon cancer  in mother- died age 25; patient refuses colonoscopy 07/16/2019   Colonoscopy refused 07/16/2019   Vaccine refused by patient- flu and others 07/16/2019   Medically noncompliant 07/16/2019   Hypertensive crisis 07/16/2019   Hypertensive disorder 12/04/2017   Abscess of right thigh 02/22/2017   Macular degeneration 10/14/2014   Vitamin D deficiency 10/14/2014   HTN (hypertension) 10/14/2014   Anemia 10/14/2014   Arthritis 10/14/2014   PCP:  [name], NP Pharmacy:   [facility], [address] [place] [address] Phone: [phone] Fax: [phone]     Social Determinants of Health (SDOH) Social History: [place]: No Food Insecurity (01/01/2023)  Housing: Low Risk  (01/01/2023)  Transportation Needs: No Transportation Needs (01/01/2023)  Utilities: Not At Risk (01/01/2023)  Alcohol Screen: Low Risk  (10/12/2022)  Depression (PHQ2-9): Low Risk  (11/27/2022)  Financial Resource Strain: Low Risk  (10/12/2022)  Physical Activity: Sufficiently Active (10/12/2022)  Social Connections: Socially Isolated (10/12/2022)  Stress: No Stress Concern Present (10/12/2022)  Tobacco Use: Medium Risk (01/01/2023)   SDOH Interventions:     Readmission Risk Interventions     No data to display

## 2023-01-19 NOTE — Consult Note (Signed)
   [facility] Inpatient Consult   01/19/2023  KAJOL BURDA December 14, 1928 [id number]  [place] Organization [ACO] Patient: Medicare ACO REACH  Primary Care Provider:  [name], NP, [facility]  Patient was reviewed for readmission with  risk score and for barriers to care for community needs.  If the patient goes to a [facility] affiliated facility then, patient can be followed by [place] [facility] PAC RN with traditional Medicare and approved Medicare Advantage plans.    Plan:   Continue to follow and notify [facility] RN , if patient goes to a [facility] affiliated facility, who can follow for any known or needs for transitional care needs for returning to post facility care coordination needs to return to community.  For questions or referrals, please contact:   Natividad Brood, RN BSN [place]  [phone] business mobile phone Toll free office [phone]  *[place]  [phone] Fax number: [phone] [email or web address]

## 2023-01-19 NOTE — Progress Notes (Signed)
PROGRESS NOTE        PATIENT DETAILS Name: Dawn Lawson Age: 89 y.o. Sex: female Date of Birth: 1929/05/18 Admit Date: 01/16/2023 Admitting Physician Kayleen Memos, DO [id number], [name], NP  Brief Summary: Patient is a 89 y.o.  female with history of chronic HFpEF, atrial fibrillation-not on anticoagulation due to recent left thigh hematoma, HTN, history of prior right knee prosthetic infection, chronic disability-just to SNF-presented to hospital with shortness of breath-found to have acute hypoxic respiratory failure due to combination of aspiration pneumonia and acute on chronic HFrEF.  See below for further details.  Significant events: 3/11-3/18>> hospitalization for HFrEF/respiratory failure-left thigh hematoma. 3/26>> admit to [facility]-severe hypoxia requiring BiPAP-aspiration PNA/HFpEF exacerbation. 3/27>> liberated off BiPAP 3/27>> on 2 L of [place]  Significant studies: 3/26>> CXR: Left lung consolidation/moderate pleural effusion.  Significant microbiology data: 3/26>> blood culture: No growth  Procedures: None  Consults: None  Subjective: Completely awake/alert-feels much better.  On minimal amount of oxygen-1-2 L.  Objective: Vitals: Blood pressure (!) 112/46, pulse 84, temperature 97.8 F (36.6 C), temperature source Oral, resp. rate 20, weight 84.5 kg, SpO2 97 %.   Exam: Gen Exam:Alert awake-not in any distress HEENT:atraumatic, normocephalic Chest: B/L clear to auscultation anteriorly CVS:S1S2 regular Abdomen:soft non tender, non distended Extremities:+ edema Neurology: Non focal Skin: no rash  Pertinent Labs/Radiology:    Latest Ref Rng & Units 01/17/2023    3:38 AM 01/16/2023    6:47 PM 01/16/2023    6:39 PM  CBC  WBC 4.0 - 10.5 K/uL 9.5   13.1   Hemoglobin 12.0 - 15.0 g/dL 9.0  10.2  9.3   Hematocrit 36.0 - 46.0 % 28.2  30.0  29.9   Platelets 150 - 400 K/uL 253   317     Lab Results  Component Value Date   NA 137  01/19/2023   K 3.6 01/19/2023   CL 97 (L) 01/19/2023   CO2 28 01/19/2023      Assessment/Plan: Acute hypoxic respiratory failure likely due to aspiration pneumonia and HFpEF exacerbation Suspect this was more from HFpEF exacerbation then aspiration pneumonia Significantly better after aggressive diuresis and IV antibiotics Stable on just 2 L of oxygen.   Aspiration pneumonia Continue IV antibiotics Cultures negative so far Appreciate SLP input Clinically significantly better today-as noted above-suspect hypoxia was more from HFpEF at this point.   Acute on chronic HFpEF Significant improvement in volume status Creatinine slightly elevated today Switch to oral diuretics today  Left-sided pleural effusion Chronic issue-seen in prior imaging studies as well I suspect this is from CHF-likely a transudate Do not think there is any benefit from pursuing thoracocentesis at this point-her hypoxia has improved-she is frail/elderly-and thoracocentesis is likely not going to provide any therapeutic benefit.  No clinical indication that this is a empyema or from exudative etiology.   Persistent atrial fibrillation Rate controlled without the use of any medications Not a candidate for anticoagulation-due to advanced age-and due to recent left thigh hematoma  Left thigh hematoma Stable left leg exam from prior hospitalization-some ecchymosis still persist Supportive care at this point.  HTN All antihypertensives were discontinued last hospitalization BP appears reasonable-will follow closely.  Normocytic anemia Due to acute illness/recent left thigh hematoma Follow Hb periodically.  CKD stage IIIb Slightly elevated than usual baseline-will follow closely while on diuretics.  Palliative care  Frail elderly patient-now with aspiration pneumonia and resultant severe hypoxic respiratory failure Son at bedside-family aware that she has improved but her overall prognosis is still  poor. Continue to mobilize with PT/OT-hopefully we can get her back to SNF over the weekend.  Obesity: Estimated body mass index is 34.07 kg/m as calculated from the following:   Height as of 01/01/23: 5\' 2"  (1.575 m).   Weight as of this encounter: 84.5 kg.   Code status:   Code Status: DNR   DVT Prophylaxis: SCDs Start: 01/17/23 0009    Family Communication: Son at bedside   Disposition Plan: Status is: Inpatient Remains inpatient appropriate because: Severity of illness   Planned Discharge Destination:Skilled nursing facility   Diet: Diet Order             Diet regular Room service appropriate? No; Fluid consistency: Thin; Fluid restriction: 1500 mL Fluid  Diet effective now                     Antimicrobial agents: Anti-infectives (From admission, onward)    Start     Dose/Rate Route Frequency Ordered Stop   01/18/23 1430  azithromycin (ZITHROMAX) tablet 500 mg        500 mg Oral Daily 01/18/23 1331 01/21/23 0959   01/17/23 1800  cefTRIAXone (ROCEPHIN) 2 g in sodium chloride 0.9 % 100 mL IVPB        2 g 200 mL/hr over 30 Minutes Intravenous Every 24 hours 01/16/23 2109     01/17/23 1800  azithromycin (ZITHROMAX) 500 mg in sodium chloride 0.9 % 250 mL IVPB  Status:  Discontinued        500 mg 250 mL/hr  Intravenous Every 24 hours 01/16/23 2109 01/18/23 1331   01/16/23 1830  cefTRIAXone (ROCEPHIN) 1 g in sodium chloride 0.9 % 100 mL IVPB        1 g 200 mL/hr over 30 Minutes Intravenous  Once 01/16/23 1826 01/16/23 1930   01/16/23 1830  azithromycin (ZITHROMAX) 500 mg in sodium chloride 0.9 % 250 mL IVPB        500 mg 250 mL/hr over 60 Minutes Intravenous  Once 01/16/23 1826 01/16/23 2020        MEDICATIONS: Scheduled Meds:  azithromycin  500 mg Oral Daily   ipratropium-albuterol  3 mL Nebulization Q6H   Continuous Infusions:  cefTRIAXone (ROCEPHIN)  IV 2 g (01/18/23 1808)   PRN Meds:.acetaminophen, polyethylene glycol, prochlorperazine   I  have personally reviewed following labs and imaging studies  LABORATORY DATA: CBC: Recent Labs  Lab 01/16/23 1839 01/16/23 1847 01/17/23 0338  WBC 13.1*  --  9.5  NEUTROABS 11.2*  --   --   HGB 9.3* 10.2* 9.0*  HCT 29.9* 30.0* 28.2*  MCV 92.0  --  91.0  PLT 317  --  253     Basic Metabolic Panel: Recent Labs  Lab 01/16/23 1839 01/16/23 1847 01/17/23 0338 01/18/23 0433 01/19/23 0702  NA 138 135 138 140 137  K 4.0 4.1 4.1 4.3 3.6  CL 95*  --  96* 96* 97*  CO2 30  --  28 29 28   GLUCOSE 155*  --  155* 97 114*  BUN 44*  --  48* 53* 51*  CREATININE 1.18*  --  1.16* 1.42* 1.58*  CALCIUM 8.7*  --  8.3* 8.3* 8.1*  MG  --   --  2.7*  --   --   PHOS  --   --  4.9*  --   --      GFR: Estimated Creatinine Clearance: 23.4 mL/min (A) (by C-G formula based on SCr of 1.58 mg/dL (H)).  Liver Function Tests: Recent Labs  Lab 01/16/23 1839 01/17/23 0338  AST 20 15  ALT 22 21  ALKPHOS 72 62  BILITOT 1.3* 0.4  PROT 6.2* 5.6*  ALBUMIN 3.1* 2.8*    No results for input(s): "LIPASE", "AMYLASE" in the last 168 hours. No results for input(s): "AMMONIA" in the last 168 hours.  Coagulation Profile: Recent Labs  Lab 01/16/23 1839  INR 1.2     Cardiac Enzymes: No results for input(s): "CKTOTAL", "CKMB", "CKMBINDEX", "TROPONINI" in the last 168 hours.  BNP (last 3 results) No results for input(s): "PROBNP" in the last 8760 hours.  Lipid Profile: No results for input(s): "CHOL", "HDL", "LDLCALC", "TRIG", "CHOLHDL", "LDLDIRECT" in the last 72 hours.  Thyroid Function Tests: No results for input(s): "TSH", "T4TOTAL", "FREET4", "T3FREE", "THYROIDAB" in the last 72 hours.  Anemia Panel: No results for input(s): "VITAMINB12", "FOLATE", "FERRITIN", "TIBC", "IRON", "RETICCTPCT" in the last 72 hours.  Urine analysis:    Component Value Date/Time   COLORURINE AMBER (A) 01/16/2023 1826   APPEARANCEUR CLOUDY (A) 01/16/2023 1826   LABSPEC 1.017 01/16/2023 1826   PHURINE 5.0  01/16/2023 1826   GLUCOSEU NEGATIVE 01/16/2023 1826   HGBUR SMALL (A) 01/16/2023 1826   BILIRUBINUR NEGATIVE 01/16/2023 1826   KETONESUR NEGATIVE 01/16/2023 1826   PROTEINUR 30 (A) 01/16/2023 1826   UROBILINOGEN 0.2 03/22/2014 1944   NITRITE NEGATIVE 01/16/2023 1826   LEUKOCYTESUR LARGE (A) 01/16/2023 1826    Sepsis Labs: Lactic Acid, Venous    Component Value Date/Time   LATICACIDVEN 1.0 01/16/2023 1839    MICROBIOLOGY: Recent Results (from the past 240 hour(s))  Blood Culture (routine x 2)     Status: None (Preliminary result)   Collection Time: 01/16/23  6:39 PM   Specimen: BLOOD RIGHT FOREARM  Result Value Ref Range Status   Specimen Description BLOOD RIGHT FOREARM  Final   Special Requests   Final    BOTTLES DRAWN AEROBIC AND ANAEROBIC Blood Culture adequate volume   Culture   Final    NO GROWTH 3 DAYS Performed at [facility], [place] [address], [place], [place] [address]    Report Status PENDING  Incomplete  Blood Culture (routine x 2)     Status: None (Preliminary result)   Collection Time: 01/16/23  6:42 PM   Specimen: BLOOD LEFT ARM  Result Value Ref Range Status   Specimen Description BLOOD LEFT ARM  Final   Special Requests   Final    BOTTLES DRAWN AEROBIC AND ANAEROBIC Blood Culture results may not be optimal due to an inadequate volume of blood received in culture bottles   Culture   Final    NO GROWTH 3 DAYS Performed at [facility], [place] [address], [place], [place] [address]    Report Status PENDING  Incomplete    RADIOLOGY STUDIES/RESULTS: No results found.   LOS: 3 days   Oren Binet, MD  [facility]    To contact the attending provider between 7A-7P or the covering provider during after hours 7P-7A, please log into the web site [email or web address] and access using universal [place] password for that web site. If you do not have the password, please call the hospital operator.  01/19/2023, 11:04 AM

## 2023-01-19 NOTE — Progress Notes (Signed)
Speech Language Pathology Treatment: Dysphagia  Patient Details Name: Dawn Lawson MRN: [id number] DOB: 08/06/1929 Today's Date: 01/19/2023 Time: [id number] SLP Time Calculation (min) (ACUTE ONLY): 23 min  Assessment / Plan / Recommendation Clinical Impression  Pt was seen for dysphagia treatment with her son and daughter-in-law present. Pt's family reported that the pt has been tolerating the meals without difficulty or any signs of aspiration since the diet was initiated yesterday. Pt was alert throughout the session and she remained reclined per her request due to c/o back pain. Pt's family reported that this is her baseline and that she often eats in this position. Pt consumed a 4oz of dual consistency boluses of peaches with juice. Mastication and oral clearance were WFL. Coughing was noted three times with this consistency, pt tolerated thin liquids via straw without overt s/s of aspiration. Coughing was also observed at baseline, but considering pt's suboptimal positioning and the absence of symptoms when she was more upright yesterday, SLP questions the potential for positioning contributing to penetration/aspiration of prematurely spilled thin liquids. The possibility of completing a modified barium swallow study was discussed with pt's son, but he requested that this be deferred since the pt is resistant to being optimally positioned and does not tend to observe recommendations. Pt was educated regarding results, recommendations, and optimizing positioning. SLP services will be discontinued at this time, but pt's son has verbalized agreement with letting staff know if they subsequently want to pursue an MBS.    HPI HPI: Pt is a 89 y.o. female who presented with acute respiratory distress and associated with progressive cough. CXR 3/26: Moderate left pleural effusion with consolidation in the left lung,  increasing since study 01/01/23. Pt recently admitted and diagnosed with acute CHF and new  onset A-fib. PMH: prediabetes, hyperlipidemia, hypertension, physical debility, obesity, arthritis, HFpEF with grade 2 diastolic dysfunction.      SLP Plan  All goals met;Discharge SLP treatment due to (comment)      Recommendations for follow up therapy are one component of a multi-disciplinary discharge planning process, led by the attending physician.  Recommendations may be updated based on patient status, additional functional criteria and insurance authorization.    Recommendations  Diet recommendations: Regular;Thin liquid Liquids provided via: Cup;Straw Medication Administration: Whole meds with liquid Supervision: Patient able to self feed Compensations: Slow rate Postural Changes and/or Swallow Maneuvers: Seated upright 90 degrees                  Oral care BID     Dysphagia, unspecified (R13.10)     All goals met;Discharge SLP treatment due to (comment)   Maleak Brazzel I. Hardin Negus, [place], [place] Office number [phone]   Horton Marshall  01/19/2023, 10:35 AM

## 2023-01-20 DIAGNOSIS — N189 Chronic kidney disease, unspecified: Secondary | ICD-10-CM | POA: Diagnosis not present

## 2023-01-20 DIAGNOSIS — J189 Pneumonia, unspecified organism: Secondary | ICD-10-CM | POA: Diagnosis not present

## 2023-01-20 DIAGNOSIS — J9621 Acute and chronic respiratory failure with hypoxia: Secondary | ICD-10-CM | POA: Diagnosis not present

## 2023-01-20 DIAGNOSIS — I5033 Acute on chronic diastolic (congestive) heart failure: Secondary | ICD-10-CM | POA: Diagnosis not present

## 2023-01-20 LAB — BASIC METABOLIC PANEL
Anion gap: 10 (ref 5–15)
BUN: 44 mg/dL — ABNORMAL HIGH (ref 8–23)
CO2: 32 mmol/L (ref 22–32)
Calcium: 8.3 mg/dL — ABNORMAL LOW (ref 8.9–10.3)
Chloride: 97 mmol/L — ABNORMAL LOW (ref 98–111)
Creatinine, Ser: 1.48 mg/dL — ABNORMAL HIGH (ref 0.44–1.00)
GFR, Estimated: 33 mL/min — ABNORMAL LOW (ref >60)
Glucose, Bld: 115 mg/dL — ABNORMAL HIGH (ref 70–99)
Potassium: 3.6 mmol/L (ref 3.5–5.1)
Sodium: 139 mmol/L (ref 135–145)

## 2023-01-20 MED ORDER — FUROSEMIDE 40 MG PO TABS
40.0000 mg | ORAL_TABLET | Freq: Every day | ORAL | Status: DC
  Administered 2023-01-20 – 2023-01-22 (×3): 40 mg via ORAL
  Filled 2023-01-20 (×3): qty 1

## 2023-01-20 MED ORDER — GUAIFENESIN ER 600 MG PO TB12
600.0000 mg | ORAL_TABLET | Freq: Two times a day (BID) | ORAL | Status: DC
  Administered 2023-01-20 – 2023-01-22 (×5): 600 mg via ORAL
  Filled 2023-01-20 (×5): qty 1

## 2023-01-20 MED ORDER — TRAZODONE HCL 50 MG PO TABS
25.0000 mg | ORAL_TABLET | Freq: Every evening | ORAL | Status: DC | PRN
  Administered 2023-01-20 (×2): 25 mg via ORAL
  Filled 2023-01-20 (×2): qty 1

## 2023-01-20 NOTE — Progress Notes (Signed)
PROGRESS NOTE        PATIENT DETAILS Name: Dawn Lawson Age: 89 y.o. Sex: female Date of Birth: 06-20-1929 Admit Date: 01/16/2023 Admitting Physician Dawn Memos, DO [id number], Dawn Ee, NP  Brief Summary: Patient is a 89 y.o.  female with history of chronic HFpEF, atrial fibrillation-not on anticoagulation due to recent left thigh hematoma, HTN, history of prior right knee prosthetic infection, chronic disability-just to SNF-presented to hospital with shortness of breath-found to have acute hypoxic respiratory failure due to combination of aspiration pneumonia and acute on chronic HFrEF.  See below for further details.  Significant events: 3/11-3/18>> hospitalization for HFrEF/respiratory failure-left thigh hematoma. 3/26>> admit to [facility]-severe hypoxia requiring BiPAP-aspiration PNA/HFpEF exacerbation. 3/27>> liberated off BiPAP 3/27>> on 2 L of [place]  Significant studies: 3/26>> CXR: Left lung consolidation/moderate pleural effusion.  Significant microbiology data: 3/26>> blood culture: No growth  Procedures: None  Consults: None  Subjective: No major issues overnight-stable on just 2-3 L of oxygen.  Objective: Vitals: Blood pressure (!) 135/48, pulse 90, temperature 98.5 F (36.9 C), temperature source Oral, resp. rate 20, weight 75.8 kg, SpO2 94 %.   Exam: Gen Exam:Alert awake-not in any distress HEENT:atraumatic, normocephalic Chest: B/L clear to auscultation anteriorly CVS:S1S2 regular Abdomen:soft non tender, non distended Extremities:+ edema Neurology: Non focal Skin: no rash  Pertinent Labs/Radiology:    Latest Ref Rng & Units 01/17/2023    3:38 AM 01/16/2023    6:47 PM 01/16/2023    6:39 PM  CBC  WBC 4.0 - 10.5 K/uL 9.5   13.1   Hemoglobin 12.0 - 15.0 g/dL 9.0  10.2  9.3   Hematocrit 36.0 - 46.0 % 28.2  30.0  29.9   Platelets 150 - 400 K/uL 253   317     Lab Results  Component Value Date   NA 139 01/20/2023   K 3.6  01/20/2023   CL 97 (L) 01/20/2023   CO2 32 01/20/2023      Assessment/Plan: Acute hypoxic respiratory failure likely due to aspiration pneumonia and HFpEF exacerbation Suspect this was more from HFpEF exacerbation then aspiration pneumonia Significantly better after aggressive diuresis and IV antibiotics Initially required BiPAP but now stable on just 2-3 L of oxygen.  Aspiration pneumonia Respiratory status much better IV antibiotics x 5 days Cultures negative so far Appreciate SLP input-tolerating diet without any major issues.  Acute on chronic HFpEF Significant improvement in volume status I/os inaccurate-weight down to 167 pounds (186 pounds on admission) Renal function stable Continue oral diuretics  Left-sided pleural effusion Chronic issue-seen in prior imaging studies as well I suspect this is from CHF-likely a transudate Do not think there is any benefit from pursuing thoracocentesis at this point-her hypoxia has improved-she is frail/elderly-and thoracocentesis is likely not going to provide any therapeutic benefit.  No clinical indication that this is a empyema or from exudative etiology.   Persistent atrial fibrillation Rate controlled without the use of any medications Not a candidate for anticoagulation-due to advanced age-and due to recent left thigh hematoma  Left thigh hematoma Stable left leg exam from prior hospitalization-some ecchymosis still persist Supportive care at this point.  HTN All antihypertensives were discontinued last hospitalization BP appears reasonable-will follow closely.  Normocytic anemia Due to acute illness/recent left thigh hematoma Follow Hb periodically.  CKD stage IIIb Relatively stable-close to baseline.  Palliative care  Frail elderly patient-now with aspiration pneumonia and resultant severe hypoxic respiratory failure Son at bedside-family aware that she has improved but her overall prognosis is still poor. Continue  to mobilize with PT/OT-hopefully we can get her back to SNF over the weekend.  Obesity: Estimated body mass index is 30.56 kg/m as calculated from the following:   Height as of 01/01/23: 5\' 2"  (1.575 m).   Weight as of this encounter: 75.8 kg.   Code status:   Code Status: DNR   DVT Prophylaxis: SCDs Start: 01/17/23 0009-not on pharmacological prophylaxis given recent thigh hematoma.   Family Communication: Son at bedside   Disposition Plan: Status is: Inpatient Remains inpatient appropriate because: Severity of illness   Planned Discharge Destination:Skilled nursing facility likely early next week.   Diet: Diet Order             Diet regular Room service appropriate? No; Fluid consistency: Thin; Fluid restriction: 1500 mL Fluid  Diet effective now                     Antimicrobial agents: Anti-infectives (From admission, onward)    Start     Dose/Rate Route Frequency Ordered Stop   01/18/23 1430  azithromycin (ZITHROMAX) tablet 500 mg        500 mg Oral Daily 01/18/23 1331 01/20/23 0943   01/17/23 1800  cefTRIAXone (ROCEPHIN) 2 g in sodium chloride 0.9 % 100 mL IVPB        2 g 200 mL/hr over 30 Minutes Intravenous Every 24 hours 01/16/23 2109     01/17/23 1800  azithromycin (ZITHROMAX) 500 mg in sodium chloride 0.9 % 250 mL IVPB  Status:  Discontinued        500 mg 250 mL/hr  Intravenous Every 24 hours 01/16/23 2109 01/18/23 1331   01/16/23 1830  cefTRIAXone (ROCEPHIN) 1 g in sodium chloride 0.9 % 100 mL IVPB        1 g 200 mL/hr over 30 Minutes Intravenous  Once 01/16/23 1826 01/16/23 1930   01/16/23 1830  azithromycin (ZITHROMAX) 500 mg in sodium chloride 0.9 % 250 mL IVPB        500 mg 250 mL/hr over 60 Minutes Intravenous  Once 01/16/23 1826 01/16/23 2020        MEDICATIONS: Scheduled Meds:  ipratropium-albuterol  3 mL Nebulization Q6H   potassium chloride  20 mEq Oral Daily   Continuous Infusions:  cefTRIAXone (ROCEPHIN)  IV 2 g (01/19/23 1823)    PRN Meds:.acetaminophen, phenylephrine, polyethylene glycol, prochlorperazine, sodium chloride, traZODone, white petrolatum   I have personally reviewed following labs and imaging studies  LABORATORY DATA: CBC: Recent Labs  Lab 01/16/23 1839 01/16/23 1847 01/17/23 0338  WBC 13.1*  --  9.5  NEUTROABS 11.2*  --   --   HGB 9.3* 10.2* 9.0*  HCT 29.9* 30.0* 28.2*  MCV 92.0  --  91.0  PLT 317  --  253     Basic Metabolic Panel: Recent Labs  Lab 01/16/23 1839 01/16/23 1847 01/17/23 0338 01/18/23 0433 01/19/23 0702 01/20/23 0707  NA 138 135 138 140 137 139  K 4.0 4.1 4.1 4.3 3.6 3.6  CL 95*  --  96* 96* 97* 97*  CO2 30  --  28 29 28  32  GLUCOSE 155*  --  155* 97 114* 115*  BUN 44*  --  48* 53* 51* 44*  CREATININE 1.18*  --  1.16* 1.42* 1.58* 1.48*  CALCIUM 8.7*  --  8.3* 8.3* 8.1* 8.3*  MG  --   --  2.7*  --   --   --   PHOS  --   --  4.9*  --   --   --      GFR: Estimated Creatinine Clearance: 23.6 mL/min (A) (by C-G formula based on SCr of 1.48 mg/dL (H)).  Liver Function Tests: Recent Labs  Lab 01/16/23 1839 01/17/23 0338  AST 20 15  ALT 22 21  ALKPHOS 72 62  BILITOT 1.3* 0.4  PROT 6.2* 5.6*  ALBUMIN 3.1* 2.8*    No results for input(s): "LIPASE", "AMYLASE" in the last 168 hours. No results for input(s): "AMMONIA" in the last 168 hours.  Coagulation Profile: Recent Labs  Lab 01/16/23 1839  INR 1.2     Cardiac Enzymes: No results for input(s): "CKTOTAL", "CKMB", "CKMBINDEX", "TROPONINI" in the last 168 hours.  BNP (last 3 results) No results for input(s): "PROBNP" in the last 8760 hours.  Lipid Profile: No results for input(s): "CHOL", "HDL", "LDLCALC", "TRIG", "CHOLHDL", "LDLDIRECT" in the last 72 hours.  Thyroid Function Tests: No results for input(s): "TSH", "T4TOTAL", "FREET4", "T3FREE", "THYROIDAB" in the last 72 hours.  Anemia Panel: No results for input(s): "VITAMINB12", "FOLATE", "FERRITIN", "TIBC", "IRON", "RETICCTPCT" in the  last 72 hours.  Urine analysis:    Component Value Date/Time   COLORURINE AMBER (A) 01/16/2023 1826   APPEARANCEUR CLOUDY (A) 01/16/2023 1826   LABSPEC 1.017 01/16/2023 1826   PHURINE 5.0 01/16/2023 1826   GLUCOSEU NEGATIVE 01/16/2023 1826   HGBUR SMALL (A) 01/16/2023 1826   BILIRUBINUR NEGATIVE 01/16/2023 1826   KETONESUR NEGATIVE 01/16/2023 1826   PROTEINUR 30 (A) 01/16/2023 1826   UROBILINOGEN 0.2 03/22/2014 1944   NITRITE NEGATIVE 01/16/2023 1826   LEUKOCYTESUR LARGE (A) 01/16/2023 1826    Sepsis Labs: Lactic Acid, Venous    Component Value Date/Time   LATICACIDVEN 1.0 01/16/2023 1839    MICROBIOLOGY: Recent Results (from the past 240 hour(s))  Blood Culture (routine x 2)     Status: None (Preliminary result)   Collection Time: 01/16/23  6:39 PM   Specimen: BLOOD RIGHT FOREARM  Result Value Ref Range Status   Specimen Description BLOOD RIGHT FOREARM  Final   Special Requests   Final    BOTTLES DRAWN AEROBIC AND ANAEROBIC Blood Culture adequate volume   Culture   Final    NO GROWTH 4 DAYS Performed at [facility], [place] [address] [place], [place] [address]    Report Status PENDING  Incomplete  Blood Culture (routine x 2)     Status: None (Preliminary result)   Collection Time: 01/16/23  6:42 PM   Specimen: BLOOD LEFT ARM  Result Value Ref Range Status   Specimen Description BLOOD LEFT ARM  Final   Special Requests   Final    BOTTLES DRAWN AEROBIC AND ANAEROBIC Blood Culture results may not be optimal due to an inadequate volume of blood received in culture bottles   Culture   Final    NO GROWTH 4 DAYS Performed at [facility], [place] [address] [place], [place] [address]    Report Status PENDING  Incomplete    RADIOLOGY STUDIES/RESULTS: No results found.   LOS: 4 days   Oren Binet, MD  [facility]    To contact the attending provider between 7A-7P or the covering provider during after hours 7P-7A, please log into the web site  [email or web address] and access using universal [place] password for  that web site. If you do not have the password, please call the hospital operator.  01/20/2023, 10:19 AM

## 2023-01-20 NOTE — Progress Notes (Signed)
Pt on [place] 3L and refuses BIPAP QHS. Pt states does not wear at home. No distress noted. Will continue to monitor

## 2023-01-21 DIAGNOSIS — J9621 Acute and chronic respiratory failure with hypoxia: Secondary | ICD-10-CM | POA: Diagnosis not present

## 2023-01-21 DIAGNOSIS — I5033 Acute on chronic diastolic (congestive) heart failure: Secondary | ICD-10-CM | POA: Diagnosis not present

## 2023-01-21 DIAGNOSIS — N189 Chronic kidney disease, unspecified: Secondary | ICD-10-CM | POA: Diagnosis not present

## 2023-01-21 DIAGNOSIS — J189 Pneumonia, unspecified organism: Secondary | ICD-10-CM | POA: Diagnosis not present

## 2023-01-21 LAB — CULTURE, BLOOD (ROUTINE X 2)
Culture: NO GROWTH
Culture: NO GROWTH
Special Requests: ADEQUATE

## 2023-01-21 NOTE — Plan of Care (Signed)

## 2023-01-21 NOTE — Progress Notes (Addendum)
PROGRESS NOTE        PATIENT DETAILS Name: Dawn Lawson Age: 89 y.o. Sex: female Date of Birth: Sep 07, 1929 Admit Date: 01/16/2023 Admitting Physician Kayleen Memos, DO [id number], [name], NP  Brief Summary: Patient is a 89 y.o.  female with history of chronic HFpEF, atrial fibrillation-not on anticoagulation due to recent left thigh hematoma, HTN, history of prior right knee prosthetic infection, chronic disability-just to SNF-presented to hospital with shortness of breath-found to have acute hypoxic respiratory failure due to combination of aspiration pneumonia and acute on chronic HFrEF.  See below for further details.  Significant events: 3/11-3/18>> hospitalization for HFrEF/respiratory failure-left thigh hematoma. 3/26>> admit to [facility]-severe hypoxia requiring BiPAP-aspiration PNA/HFpEF exacerbation. 3/27>> liberated off BiPAP 3/27>> on 2 L of [place]  Significant studies: 3/26>> CXR: Left lung consolidation/moderate pleural effusion.  Significant microbiology data: 3/26>> blood culture: No growth  Procedures: None  Consults: None  Subjective: No major issues-stable on just 2 L of oxygen this morning.  Objective: Vitals: Blood pressure 124/80, pulse 89, temperature 98.1 F (36.7 C), temperature source Oral, resp. rate (!) 21, weight 75.2 kg, SpO2 99 %.   Exam: Gen Exam:Alert awake-not in any distress HEENT:atraumatic, normocephalic Chest: B/L clear to auscultation anteriorly CVS:S1S2 regular Abdomen:soft non tender, non distended Extremities: Trace edema Neurology: Non focal Skin: no rash  Pertinent Labs/Radiology:    Latest Ref Rng & Units 01/17/2023    3:38 AM 01/16/2023    6:47 PM 01/16/2023    6:39 PM  CBC  WBC 4.0 - 10.5 K/uL 9.5   13.1   Hemoglobin 12.0 - 15.0 g/dL 9.0  10.2  9.3   Hematocrit 36.0 - 46.0 % 28.2  30.0  29.9   Platelets 150 - 400 K/uL 253   317     Lab Results  Component Value Date   NA 139 01/20/2023   K  3.6 01/20/2023   CL 97 (L) 01/20/2023   CO2 32 01/20/2023      Assessment/Plan: Acute hypoxic respiratory failure likely due to aspiration pneumonia and HFpEF exacerbation Suspect this was more from HFpEF exacerbation then aspiration pneumonia Significantly better after aggressive diuresis and IV antibiotics Initially required BiPAP but now stable on just 2-3 L of oxygen.  Aspiration pneumonia Respiratory status much better IV antibiotics x 5 days completed on 3/30 Cultures negative s Appreciate SLP input-tolerating diet without any major issues.  Acute on chronic HFpEF Significant improvement in volume status I/os inaccurate-weight continues to improve (186 pounds on admission) Renal function stable Continue oral diuretics  Filed Weights   01/19/23 0500 01/20/23 0426 01/21/23 0500  Weight: 84.5 kg 75.8 kg 75.2 kg     Left-sided pleural effusion Chronic issue-seen in prior imaging studies as well I suspect this is from CHF-likely a transudate Do not think there is any benefit from pursuing thoracocentesis at this point-her hypoxia has improved-she is frail/elderly-and thoracocentesis is likely not going to provide any therapeutic benefit.  No clinical indication that this is a empyema or from exudative etiology.   Persistent atrial fibrillation Rate controlled without the use of any medications Not a candidate for anticoagulation-due to advanced age-and due to recent left thigh hematoma  Left thigh hematoma Stable left leg exam from prior hospitalization-some ecchymosis still persist Supportive care at this point.  HTN All antihypertensives were discontinued last hospitalization BP appears reasonable-will follow  closely.  Normocytic anemia Due to acute illness/recent left thigh hematoma Follow Hb periodically.  CKD stage IIIb Relatively stable-close to baseline.  Palliative care Frail elderly patient-now with aspiration pneumonia and resultant severe hypoxic  respiratory failure Son at bedside-family aware that she has improved but her overall prognosis is still poor. Continue to mobilize with PT/OT SNF when bed available  Obesity: Estimated body mass index is 30.32 kg/m as calculated from the following:   Height as of 01/01/23: 5\' 2"  (1.575 m).   Weight as of this encounter: 75.2 kg.   Code status:   Code Status: DNR   DVT Prophylaxis: SCDs Start: 01/17/23 0009-not on pharmacological prophylaxis given recent thigh hematoma.   Family Communication: Son at bedside on 3/30-none at bedside this morning.   Disposition Plan: Status is: Inpatient Remains inpatient appropriate because: Severity of illness   Planned Discharge Destination:Skilled nursing facility when bed available.   Diet: Diet Order             Diet regular Room service appropriate? No; Fluid consistency: Thin; Fluid restriction: 1500 mL Fluid  Diet effective now                     Antimicrobial agents: Anti-infectives (From admission, onward)    Start     Dose/Rate Route Frequency Ordered Stop   01/18/23 1430  azithromycin (ZITHROMAX) tablet 500 mg        500 mg Oral Daily 01/18/23 1331 01/20/23 0943   01/17/23 1800  cefTRIAXone (ROCEPHIN) 2 g in sodium chloride 0.9 % 100 mL IVPB        2 g 200 mL/hr over 30 Minutes Intravenous Every 24 hours 01/16/23 2109 01/22/23 1759   01/17/23 1800  azithromycin (ZITHROMAX) 500 mg in sodium chloride 0.9 % 250 mL IVPB  Status:  Discontinued        500 mg 250 mL/hr  Intravenous Every 24 hours 01/16/23 2109 01/18/23 1331   01/16/23 1830  cefTRIAXone (ROCEPHIN) 1 g in sodium chloride 0.9 % 100 mL IVPB        1 g 200 mL/hr over 30 Minutes Intravenous  Once 01/16/23 1826 01/16/23 1930   01/16/23 1830  azithromycin (ZITHROMAX) 500 mg in sodium chloride 0.9 % 250 mL IVPB        500 mg 250 mL/hr over 60 Minutes Intravenous  Once 01/16/23 1826 01/16/23 2020        MEDICATIONS: Scheduled Meds:  furosemide  40 mg Oral  Daily   guaiFENesin  600 mg Oral BID   ipratropium-albuterol  3 mL Nebulization Q6H   potassium chloride  20 mEq Oral Daily   Continuous Infusions:  cefTRIAXone (ROCEPHIN)  IV 2 g (01/20/23 1812)   PRN Meds:.acetaminophen, phenylephrine, polyethylene glycol, prochlorperazine, sodium chloride, traZODone, white petrolatum   I have personally reviewed following labs and imaging studies  LABORATORY DATA: CBC: Recent Labs  Lab 01/16/23 1839 01/16/23 1847 01/17/23 0338  WBC 13.1*  --  9.5  NEUTROABS 11.2*  --   --   HGB 9.3* 10.2* 9.0*  HCT 29.9* 30.0* 28.2*  MCV 92.0  --  91.0  PLT 317  --  253     Basic Metabolic Panel: Recent Labs  Lab 01/16/23 1839 01/16/23 1847 01/17/23 0338 01/18/23 0433 01/19/23 0702 01/20/23 0707  NA 138 135 138 140 137 139  K 4.0 4.1 4.1 4.3 3.6 3.6  CL 95*  --  96* 96* 97* 97*  CO2 30  --  28 29 28  32  GLUCOSE 155*  --  155* 97 114* 115*  BUN 44*  --  48* 53* 51* 44*  CREATININE 1.18*  --  1.16* 1.42* 1.58* 1.48*  CALCIUM 8.7*  --  8.3* 8.3* 8.1* 8.3*  MG  --   --  2.7*  --   --   --   PHOS  --   --  4.9*  --   --   --      GFR: Estimated Creatinine Clearance: 23.5 mL/min (A) (by C-G formula based on SCr of 1.48 mg/dL (H)).  Liver Function Tests: Recent Labs  Lab 01/16/23 1839 01/17/23 0338  AST 20 15  ALT 22 21  ALKPHOS 72 62  BILITOT 1.3* 0.4  PROT 6.2* 5.6*  ALBUMIN 3.1* 2.8*    No results for input(s): "LIPASE", "AMYLASE" in the last 168 hours. No results for input(s): "AMMONIA" in the last 168 hours.  Coagulation Profile: Recent Labs  Lab 01/16/23 1839  INR 1.2     Cardiac Enzymes: No results for input(s): "CKTOTAL", "CKMB", "CKMBINDEX", "TROPONINI" in the last 168 hours.  BNP (last 3 results) No results for input(s): "PROBNP" in the last 8760 hours.  Lipid Profile: No results for input(s): "CHOL", "HDL", "LDLCALC", "TRIG", "CHOLHDL", "LDLDIRECT" in the last 72 hours.  Thyroid Function Tests: No results  for input(s): "TSH", "T4TOTAL", "FREET4", "T3FREE", "THYROIDAB" in the last 72 hours.  Anemia Panel: No results for input(s): "VITAMINB12", "FOLATE", "FERRITIN", "TIBC", "IRON", "RETICCTPCT" in the last 72 hours.  Urine analysis:    Component Value Date/Time   COLORURINE AMBER (A) 01/16/2023 1826   APPEARANCEUR CLOUDY (A) 01/16/2023 1826   LABSPEC 1.017 01/16/2023 1826   PHURINE 5.0 01/16/2023 1826   GLUCOSEU NEGATIVE 01/16/2023 1826   HGBUR SMALL (A) 01/16/2023 1826   BILIRUBINUR NEGATIVE 01/16/2023 1826   KETONESUR NEGATIVE 01/16/2023 1826   PROTEINUR 30 (A) 01/16/2023 1826   UROBILINOGEN 0.2 03/22/2014 1944   NITRITE NEGATIVE 01/16/2023 1826   LEUKOCYTESUR LARGE (A) 01/16/2023 1826    Sepsis Labs: Lactic Acid, Venous    Component Value Date/Time   LATICACIDVEN 1.0 01/16/2023 1839    MICROBIOLOGY: Recent Results (from the past 240 hour(s))  Blood Culture (routine x 2)     Status: None   Collection Time: 01/16/23  6:39 PM   Specimen: BLOOD RIGHT FOREARM  Result Value Ref Range Status   Specimen Description BLOOD RIGHT FOREARM  Final   Special Requests   Final    BOTTLES DRAWN AEROBIC AND ANAEROBIC Blood Culture adequate volume   Culture   Final    NO GROWTH 5 DAYS Performed at [facility], [address] [place], [place] [address]    Report Status 01/21/2023 FINAL  Final  Blood Culture (routine x 2)     Status: None   Collection Time: 01/16/23  6:42 PM   Specimen: BLOOD LEFT ARM  Result Value Ref Range Status   Specimen Description BLOOD LEFT ARM  Final   Special Requests   Final    BOTTLES DRAWN AEROBIC AND ANAEROBIC Blood Culture results may not be optimal due to an inadequate volume of blood received in culture bottles   Culture   Final    NO GROWTH 5 DAYS Performed at [facility], [place] [address], [place], [place] [address]    Report Status 01/21/2023 FINAL  Final    RADIOLOGY STUDIES/RESULTS: No results found.   LOS: 5 days   Dawn Lawson  Peggy Monk, MD  [facility]    To contact the attending provider between 7A-7P or the covering provider during after hours 7P-7A, please log into the web site [email or web address] and access using universal [place] password for that web site. If you do not have the password, please call the hospital operator.  01/21/2023, 10:41 AM

## 2023-01-22 DIAGNOSIS — J189 Pneumonia, unspecified organism: Secondary | ICD-10-CM | POA: Diagnosis not present

## 2023-01-22 DIAGNOSIS — I5033 Acute on chronic diastolic (congestive) heart failure: Secondary | ICD-10-CM | POA: Diagnosis not present

## 2023-01-22 DIAGNOSIS — N189 Chronic kidney disease, unspecified: Secondary | ICD-10-CM | POA: Diagnosis not present

## 2023-01-22 DIAGNOSIS — J9621 Acute and chronic respiratory failure with hypoxia: Secondary | ICD-10-CM | POA: Diagnosis not present

## 2023-01-22 DIAGNOSIS — I482 Chronic atrial fibrillation, unspecified: Secondary | ICD-10-CM

## 2023-01-22 LAB — LEGIONELLA PNEUMOPHILA SEROGP 1 UR AG: L. pneumophila Serogp 1 Ur Ag: NEGATIVE

## 2023-01-22 MED ORDER — IPRATROPIUM-ALBUTEROL 0.5-2.5 (3) MG/3ML IN SOLN: 3.0000 mL | RESPIRATORY_TRACT | Status: DC | PRN

## 2023-01-22 MED ORDER — MORPHINE SULFATE (CONCENTRATE) 10 MG/0.5ML PO SOLN: 5.0000 mg | ORAL | 0 refills | Status: DC | PRN

## 2023-01-22 MED ORDER — MORPHINE SULFATE (CONCENTRATE) 10 MG/0.5ML PO SOLN: 5.0000 mg | ORAL | Status: DC | PRN

## 2023-01-22 NOTE — TOC Progression Note (Signed)
Transition of Care [facility]) - Progression Note    Patient Details  Name: Dawn Lawson MRN: [id number] Date of Birth: 1929-02-08  Transition of Care [facility]) CM/SW [place], LCSW Phone Number: 01/22/2023, 11:42 AM  Clinical Narrative:    10am-CSW spoke with patient's son regarding request for [facility]. CSW explained that patient must meet facility level hospice criteria. If she does not, then it would be private pay for SNF and Hospice. Son stated he cannot care for patient at home. Son requests evaluation by [facility]. CSW sent referral for review.    Expected Discharge Plan: [place] Barriers to Discharge:  (Hospice assessment)  Expected Discharge Plan and Services In-house Referral: Clinical Social Work   Post Acute Care Choice: Residential Hospice Bed Living arrangements for the past 2 months: Single Family Home                                       Social Determinants of Health (SDOH) Interventions SDOH Screenings   Food Insecurity: No Food Insecurity (01/01/2023)  Housing: Low Risk  (01/01/2023)  Transportation Needs: No Transportation Needs (01/01/2023)  Utilities: Not At Risk (01/01/2023)  Alcohol Screen: Low Risk  (10/12/2022)  Depression (PHQ2-9): Low Risk  (11/27/2022)  Financial Resource Strain: Low Risk  (10/12/2022)  Physical Activity: Sufficiently Active (10/12/2022)  Social Connections: Socially Isolated (10/12/2022)  Stress: No Stress Concern Present (10/12/2022)  Tobacco Use: Medium Risk (01/01/2023)    Readmission Risk Interventions     No data to display

## 2023-01-22 NOTE — Discharge Summary (Signed)
PATIENT DETAILS Name: Dawn Lawson Age: 89 y.o. Sex: female Date of Birth: 11-06-1928 MRN: [id number]. Admitting Physician: Kayleen Memos, DO [id number], [name], NP  Admit Date: 01/16/2023 Discharge date: 01/22/2023  Recommendations for Outpatient Follow-up:  Optimize comfort care  Admitted From:  SNF  Disposition: Hospice care   Discharge Condition: poor  CODE STATUS:   Code Status: DNR   Diet recommendation:  Diet Order             Diet general           Diet regular Room service appropriate? No; Fluid consistency: Thin; Fluid restriction: 1500 mL Fluid  Diet effective now                    Brief Summary: Patient is a 89 y.o.  female with history of chronic HFpEF, atrial fibrillation-not on anticoagulation due to recent left thigh hematoma, HTN, history of prior right knee prosthetic infection, chronic disability-just to SNF-presented to hospital with shortness of breath-found to have acute hypoxic respiratory failure due to combination of aspiration pneumonia and acute on chronic HFrEF.  See below for further details.   Significant events: 3/11-3/18>> hospitalization for HFrEF/respiratory failure-left thigh hematoma. 3/26>> admit to [facility]-severe hypoxia requiring BiPAP-aspiration PNA/HFpEF exacerbation. 3/27>> liberated off BiPAP 3/27>> on 2 L of [place]   Significant studies: 3/26>> CXR: Left lung consolidation/moderate pleural effusion.   Significant microbiology data: 3/26>> blood culture: No growth   Procedures: None   Consults: None   Brief Hospital Course: Acute hypoxic respiratory failure likely due to aspiration pneumonia and HFpEF exacerbation Suspect this was more from HFpEF exacerbation then aspiration pneumonia Significantly better after aggressive diuresis and IV antibiotics Initially required BiPAP but now stable on just 2-3 L of oxygen.   Aspiration pneumonia Respiratory status much better IV antibiotics x 5 days completed on  3/30 Cultures negative Appreciate SLP input-tolerating diet without any major issues.  However oral intake very poor over the past several days.  See below. Given frailty-remains at risk for recurrent aspiration episodes in the future   Acute on chronic HFpEF Significant improvement in volume status I/os inaccurate-weight continues to improve (186 pounds on admission) Renal function stable Continue oral diuretics for comfort  Left-sided pleural effusion Chronic issue-seen in prior imaging studies as well I suspect this is from CHF-likely a transudate Do not think there is any benefit from pursuing thoracocentesis at this point-her hypoxia has improved-she is frail/elderly-and thoracocentesis is likely not going to provide any therapeutic benefit.  No clinical indication that this is a empyema or from exudative etiology.    Persistent atrial fibrillation Rate controlled without the use of any medications Not a candidate for anticoagulation-due to advanced age-and due to recent left thigh hematoma   Left thigh hematoma Stable left leg exam from prior hospitalization-some ecchymosis still persist Supportive care at this point.   HTN All antihypertensives were discontinued last hospitalization BP appears reasonable-follow closely.   Normocytic anemia Due to acute illness/recent left thigh hematoma Follow Hb periodically.   CKD stage IIIb Relatively stable-close to baseline.   Failure to thrive syndrome Palliative care Frail elderly patient-now with aspiration pneumonia and resultant severe hypoxic respiratory failure.  Although much improved compared to on admission-improvement has plateaued over the past several days-appears frail/weak/intermittent confusion-poor oral intake.  Overall prognosis is poor-suspect best served by initiation of hospice care at some point.  No role in any further escalation of care at this point. Suspect that if we  transition to hospice care-and she  continues to have poor oral intake-her life expectancy could be potentially less than a few weeks.   Family has had a chance to discuss options with social work, and have elected to go to residential hospice   Obesity: Estimated body mass index is 30.32 kg/m as calculated from the following:   Height as of 01/01/23: 5\' 2"  (1.575 m).   Weight as of this encounter: 75.2 kg.     Discharge Diagnoses:  Principal Problem:   CAP (community acquired pneumonia)   Discharge Instructions:  Activity:  As tolerated with Full fall precautions use walker/cane & assistance as needed  Discharge Instructions     Diet general   Complete by: As directed    Discharge instructions   Complete by: As directed    Follow with Primary MD  [name], NP in 1-2 weeks  Please get a complete blood count and chemistry panel checked by your Primary MD at your next visit, and again as instructed by your Primary MD.  Get Medicines reviewed and adjusted: Please take all your medications with you for your next visit with your Primary MD  Laboratory/radiological data: Please request your Primary MD to go over all hospital tests and procedure/radiological results at the follow up, please ask your Primary MD to get all Hospital records sent to his/her office.  In some cases, they will be blood work, cultures and biopsy results pending at the time of your discharge. Please request that your primary care M.D. follows up on these results.  Also Note the following: If you experience worsening of your admission symptoms, develop shortness of breath, life threatening emergency, suicidal or homicidal thoughts you must seek medical attention immediately by calling 911 or calling your MD immediately  if symptoms less severe.  You must read complete instructions/literature along with all the possible adverse reactions/side effects for all the Medicines you take and that have been prescribed to you. Take any new  Medicines after you have completely understood and accpet all the possible adverse reactions/side effects.   Do not drive when taking Pain medications or sleeping medications (Benzodaizepines)  Do not take more than prescribed Pain, Sleep and Anxiety Medications. It is not advisable to combine anxiety,sleep and pain medications without talking with your primary care practitioner  Special Instructions: If you have smoked or chewed Tobacco  in the last 2 yrs please stop smoking, stop any regular Alcohol  and or any Recreational drug use.  Wear Seat belts while driving.  Please note: You were cared for by a hospitalist during your hospital stay. Once you are discharged, your primary care physician will handle any further medical issues. Please note that NO REFILLS for any discharge medications will be authorized once you are discharged, as it is imperative that you return to your primary care physician (or establish a relationship with a primary care physician if you do not have one) for your post hospital discharge needs so that they can reassess your need for medications and monitor your lab values.   Increase activity slowly   Complete by: As directed       Allergies as of 01/22/2023       Reactions   Penicillins Hives, Itching   Tolerated Ancef 11/17 and cefepime 02/2017 Has patient had a PCN reaction causing immediate rash, facial/tongue/throat swelling, SOB or lightheadedness with hypotension: Yes Has patient had a PCN reaction causing severe rash involving mucus membranes or skin necrosis: No Has patient  had a PCN reaction that required hospitalization: No Has patient had a PCN reaction occurring within the last 10 years:  # # YES # #  If all of the above answers are "NO", then may proceed with Cephalosporin use.   Chocolate Other (See Comments)   Migraines   Tape Itching   Ampicillin Hives, Itching        Medication List     STOP taking these medications     bisoprolol-hydrochlorothiazide 5-6.25 MG tablet Commonly known as: ZIAC   cephALEXin 500 MG capsule Commonly known as: Keflex   losartan 50 MG tablet Commonly known as: COZAAR   MULTIVITAMIN PO   PRESERVISION AREDS PO   rosuvastatin 5 MG tablet Commonly known as: CRESTOR   senna-docusate 8.6-50 MG tablet Commonly known as: Senokot-S       TAKE these medications    acetaminophen 500 MG tablet Commonly known as: TYLENOL Take 500-1,000 mg by mouth every 6 (six) hours as needed for moderate pain.   furosemide 20 MG tablet Commonly known as: LASIX Take 1 tablet (20 mg total) by mouth daily.   ipratropium-albuterol 0.5-2.5 (3) MG/3ML Soln Commonly known as: DUONEB Take 3 mLs by nebulization every 4 (four) hours as needed.   morphine CONCENTRATE 10 MG/0.5ML Soln concentrated solution Take 0.25 mLs (5 mg total) by mouth every 2 (two) hours as needed for anxiety, moderate pain or shortness of breath.        Follow-up Information     [name], NP Follow up.   Specialty: Family Medicine Why: As needed Contact information: [place] [address] [phone]                Allergies  Allergen Reactions   Penicillins Hives and Itching    Tolerated Ancef 11/17 and cefepime 02/2017  Has patient had a PCN reaction causing immediate rash, facial/tongue/throat swelling, SOB or lightheadedness with hypotension: Yes Has patient had a PCN reaction causing severe rash involving mucus membranes or skin necrosis: No Has patient had a PCN reaction that required hospitalization: No Has patient had a PCN reaction occurring within the last 10 years:  # # YES # #  If all of the above answers are "NO", then may proceed with Cephalosporin use.   Chocolate Other (See Comments)    Migraines    Tape Itching   Ampicillin Hives and Itching     Other Procedures/Studies: VAS [place] LOWER EXTREMITY VENOUS (DVT)  Result Date: 01/17/2023  Lower  Venous DVT Study Patient Name:  BINTU TUMMINIA  Date of Exam:   01/17/2023 Medical Rec #: [id number]       Accession #:    [id number] Date of Birth: 07-18-1929       Patient Gender: F Patient Age:   52 years Exam Location:  [facility] Procedure:      VAS [place] LOWER EXTREMITY VENOUS (DVT) Referring Phys: Archie Patten HALL --------------------------------------------------------------------------------  Indications: Swelling, and Bruising and large lump LLE.  Comparison Study: Previous RLE 10/12/14 negative. Performing Technologist: McKayla Maag RVT, [place]  Examination Guidelines: A complete evaluation includes B-mode imaging, spectral Doppler, color Doppler, and power Doppler as needed of all accessible portions of each vessel. Bilateral testing is considered an integral part of a complete examination. Limited examinations for reoccurring indications may be performed as noted. The reflux portion of the exam is performed with the patient in reverse Trendelenburg.  +--------+---------------+---------+-----------+----------+--------------------+ RIGHT   CompressibilityPhasicitySpontaneityPropertiesThrombus Aging       +--------+---------------+---------+-----------+----------+--------------------+  CFV     Full           Yes      Yes                                       +--------+---------------+---------+-----------+----------+--------------------+ SFJ     Full                                                              +--------+---------------+---------+-----------+----------+--------------------+ FV Prox Full                                                              +--------+---------------+---------+-----------+----------+--------------------+ FV Mid                 Yes      Yes                  patent by color                                                           doppler.             +--------+---------------+---------+-----------+----------+--------------------+ FV                      Yes      Yes                  patent by color      Distal                                               doppler.             +--------+---------------+---------+-----------+----------+--------------------+ PFV     Full                                                              +--------+---------------+---------+-----------+----------+--------------------+ POP                                                  Not visualized       +--------+---------------+---------+-----------+----------+--------------------+ PTV     Full                                                              +--------+---------------+---------+-----------+----------+--------------------+  PERO    Full                                         Not well visualized  +--------+---------------+---------+-----------+----------+--------------------+   +---------+---------------+---------+-----------+----------+-------------------+ LEFT     CompressibilityPhasicitySpontaneityPropertiesThrombus Aging      +---------+---------------+---------+-----------+----------+-------------------+ CFV      Full           Yes      Yes                                      +---------+---------------+---------+-----------+----------+-------------------+ SFJ      Full                                                             +---------+---------------+---------+-----------+----------+-------------------+ FV Prox  Full                                                             +---------+---------------+---------+-----------+----------+-------------------+ FV Mid   Full                                                             +---------+---------------+---------+-----------+----------+-------------------+ FV DistalFull                                                             +---------+---------------+---------+-----------+----------+-------------------+ PFV       Full                                                             +---------+---------------+---------+-----------+----------+-------------------+ POP      Full           Yes      Yes                                      +---------+---------------+---------+-----------+----------+-------------------+ PTV      Full                                                             +---------+---------------+---------+-----------+----------+-------------------+ PERO     Full  Not well visualized +---------+---------------+---------+-----------+----------+-------------------+ Non-vascularized area is noted and measured left distal medial thigh.    Summary: RIGHT: - There is no evidence of deep vein thrombosis in the lower extremity. However, portions of this examination were limited- see technologist comments above.  - No cystic structure found in the popliteal fossa. - Ultrasound characteristics of enlarged lymph nodes are noted in the groin.  LEFT: - There is no evidence of deep vein thrombosis in the lower extremity. However, portions of this examination were limited- see technologist comments above.  - No cystic structure found in the popliteal fossa. - Non-vascularized area is noted and measured left distal medial thigh.  *See table(s) above for measurements and observations. Electronically signed by Deitra Mayo MD on 01/17/2023 at 2:27:50 PM.    Final    DG Chest Port 1 View  Result Date: 01/16/2023 CLINICAL DATA:  Question of sepsis. Evaluate for abnormality. Shortness of breath. Diagnosed with pneumonia today. EXAM: PORTABLE CHEST 1 VIEW COMPARISON:  01/01/2023 FINDINGS: Moderate left pleural effusion with consolidation and volume loss in the left lung, likely pneumonia. Changes are progressing since the previous study. Small right pleural effusion. Heart size is enlarged. No pneumothorax. Calcified and tortuous aorta. Degenerative changes in  the spine and shoulders. IMPRESSION: Moderate left pleural effusion with consolidation in the left lung, increasing since prior study. Electronically Signed   By: Lucienne Capers M.D.   On: 01/16/2023 19:09   CT FEMUR LEFT WO CONTRAST  Result Date: 01/05/2023 CLINICAL DATA:  Thigh soft tissue mass, deep. Evaluate for hematoma. EXAM: CT OF THE LOWER LEFT EXTREMITY WITHOUT CONTRAST TECHNIQUE: Multidetector CT imaging of the lower left extremity was performed according to the standard protocol. RADIATION DOSE REDUCTION: This exam was performed according to the departmental dose-optimization program which includes automated exposure control, adjustment of the mA and/or kV according to patient size and/or use of iterative reconstruction technique. COMPARISON:  None Available. FINDINGS: Bones/Joint/Cartilage There is diffuse decreased bone mineralization. Markedly severe medial greater than lateral knee compartment joint space narrowing bone-on-bone contact and large peripheral osteophytes. There is also severe patellofemoral joint space narrowing and peripheral osteophytosis. Moderate to severe pubic symphysis joint space narrowing and peripheral osteophytosis. Moderate to severe from acetabular joint space narrowing with moderate peripheral acetabular and inferior humeral head-neck junction degenerative osteophytes. Ligaments Suboptimally assessed by CT. Muscles and Tendons Normal density and size of the regional left thigh musculature. Soft tissues There is moderate subcutaneous fat edema and swelling throughout the visualized lower left anterior abdominal wall and the left thigh diffusely, greatest within the lateral greater than medial aspect of the proximal mid left thigh. There are multiple lobular structures of internal density ranging from approximately 31-35 Hounsfield units which may represent a hematoma versus soft tissue masses. The dominant coalescing collection measures up to approximately 4.4 x 6.4 x  12.5 cm (transverse by AP by craniocaudal, as measured on axial series 6, image 229 and coronal series 9, image 93). Given the lobular morphology and smaller, separate minimally contacting oval masses at the superior aspect, a hematoma is favored over contiguous soft tissue masses. Partially visualized markedly high-grade sigmoid diverticulosis without definite inflammatory change. Surgical sutures overlie the midline anterior superior pelvis. There are high-grade atherosclerotic calcifications. IMPRESSION: 1. There is a 4.4 x 6.4 x 12.5 cm lobular soft tissue density mass within the lateral greater than medial aspect of the proximal left thigh. Given the lobular morphology and the small, only minimally contacting components superiorly, this is favored to represent  a hematoma, however contiguous soft tissue masses can have a similar appearance. Recommend close clinical follow-up to ensure this suspected hematoma resolves clinically. 2. There is moderate subcutaneous fat edema and swelling throughout the visualized lower left anterior abdominal wall and the left thigh. 3. Partially visualized high-grade sigmoid diverticulosis without definite inflammatory change. 4. Severe tricompartmental osteoarthritis of the left knee. 5. Moderate to severe left hip osteoarthritis. Electronically Signed   By: Yvonne Kendall M.D.   On: 01/05/2023 12:54   ECHOCARDIOGRAM COMPLETE  Result Date: 01/01/2023    ECHOCARDIOGRAM REPORT   Patient Name:   URSALA KUBIT Date of Exam: 01/01/2023 Medical Rec #:  [id number]      Height:       62.0 in Accession #:    [id number]     Weight:       190.0 lb Date of Birth:  Oct 28, 1928      BSA:          1.870 m Patient Age:    22 years       BP:           117/41 mmHg Patient Gender: F              HR:           48 bpm. Exam Location:  Inpatient Procedure: 2D Echo, Cardiac Doppler and Color Doppler Indications:    elevated troponin  History:        Patient has prior history of Echocardiogram  examinations. Risk                 Factors:Hypertension.  Sonographer:    Phineas Douglas Referring Phys: [place]  1. Left ventricular ejection fraction, by estimation, is 60 to 65%. The left ventricle has normal function. The left ventricle has no regional wall motion abnormalities. There is mild concentric left ventricular hypertrophy. Left ventricular diastolic function could not be evaluated.  2. Right ventricular systolic function is mildly reduced. The right ventricular size is moderately enlarged. There is moderately elevated pulmonary artery systolic pressure. The estimated right ventricular systolic pressure is 0000000 mmHg.  3. Left atrial size was severely dilated.  4. Right atrial size was severely dilated.  5. The mitral valve is normal in structure. Mild to moderate mitral valve regurgitation. No evidence of mitral stenosis.  6. Tricuspid valve regurgitation is moderate.  7. The aortic valve is tricuspid. There is mild calcification of the aortic valve. Aortic valve regurgitation is mild to moderate. Aortic valve sclerosis/calcification is present, without any evidence of aortic stenosis. Aortic regurgitation PHT measures 426 msec.  8. Aortic dilatation noted. There is borderline dilatation of the ascending aorta, measuring 37 mm.  9. The inferior vena cava is dilated in size with <50% respiratory variability, suggesting right atrial pressure of 15 mmHg. FINDINGS  Left Ventricle: Left ventricular ejection fraction, by estimation, is 60 to 65%. The left ventricle has normal function. The left ventricle has no regional wall motion abnormalities. The left ventricular internal cavity size was normal in size. There is  mild concentric left ventricular hypertrophy. Left ventricular diastolic function could not be evaluated due to atrial fibrillation. Left ventricular diastolic function could not be evaluated. Right Ventricle: The right ventricular size is moderately enlarged. No increase in  right ventricular wall thickness. Right ventricular systolic function is mildly reduced. There is moderately elevated pulmonary artery systolic pressure. The tricuspid regurgitant velocity is 2.74 m/s, and with an assumed right atrial pressure of 15 mmHg,  the estimated right ventricular systolic pressure is 0000000 mmHg. Left Atrium: Left atrial size was severely dilated. Right Atrium: Right atrial size was severely dilated. Pericardium: There is no evidence of pericardial effusion. Mitral Valve: The mitral valve is normal in structure. Mild to moderate mitral valve regurgitation. No evidence of mitral valve stenosis. Tricuspid Valve: The tricuspid valve is normal in structure. Tricuspid valve regurgitation is moderate . No evidence of tricuspid stenosis. Aortic Valve: The aortic valve is tricuspid. There is mild calcification of the aortic valve. Aortic valve regurgitation is mild to moderate. Aortic regurgitation PHT measures 426 msec. Aortic valve sclerosis/calcification is present, without any evidence of aortic stenosis. Pulmonic Valve: The pulmonic valve was normal in structure. Pulmonic valve regurgitation is not visualized. No evidence of pulmonic stenosis. Aorta: Aortic dilatation noted. There is borderline dilatation of the ascending aorta, measuring 37 mm. Venous: The inferior vena cava is dilated in size with less than 50% respiratory variability, suggesting right atrial pressure of 15 mmHg. IAS/Shunts: No atrial level shunt detected by color flow Doppler. Additional Comments: There is pleural effusion in the left lateral region.  LEFT VENTRICLE PLAX 2D LVIDd:         3.80 cm     Diastology LVIDs:         2.30 cm     LV e' medial:    7.29 cm/s LV PW:         1.20 cm     LV E/e' medial:  16.3 LV IVS:        1.20 cm     LV e' lateral:   8.27 cm/s LVOT diam:     1.80 cm     LV E/e' lateral: 14.4 LV SV:         54 LV SV Index:   29 LVOT Area:     2.54 cm  LV Volumes (MOD) LV vol d, MOD A2C: 84.5 ml LV vol d,  MOD A4C: 96.2 ml LV vol s, MOD A2C: 29.1 ml LV vol s, MOD A4C: 33.5 ml LV SV MOD A2C:     55.4 ml LV SV MOD A4C:     96.2 ml LV SV MOD BP:      61.8 ml RIGHT VENTRICLE             IVC RV Basal diam:  5.50 cm     IVC diam: 2.70 cm RV S prime:     10.70 cm/s TAPSE (M-mode): 2.1 cm LEFT ATRIUM              Index        RIGHT ATRIUM           Index LA diam:        4.10 cm  2.19 cm/m   RA Area:     34.20 cm LA Vol (A2C):   88.6 ml  47.37 ml/m  RA Volume:   119.00 ml 63.62 ml/m LA Vol (A4C):   101.0 ml 54.00 ml/m LA Biplane Vol: 96.5 ml  51.59 ml/m  AORTIC VALVE LVOT Vmax:   84.50 cm/s LVOT Vmean:  57.800 cm/s LVOT VTI:    0.213 m AI PHT:      426 msec  AORTA Ao Root diam: 3.40 cm Ao Asc diam:  3.70 cm MITRAL VALVE                TRICUSPID VALVE MV Area (PHT): 4.19 cm     TR Peak grad:   30.0 mmHg MV  Decel Time: 181 msec     TR Vmax:        274.00 cm/s MR Peak grad: 80.3 mmHg MR Mean grad: 53.0 mmHg     SHUNTS MR Vmax:      448.00 cm/s   Systemic VTI:  0.21 m MR Vmean:     349.0 cm/s    Systemic Diam: 1.80 cm MV E velocity: 119.00 cm/s Glori Bickers MD Electronically signed by Glori Bickers MD Signature Date/Time: 01/01/2023/2:05:56 PM    Final    DG Chest Port 1 View  Result Date: 01/01/2023 CLINICAL DATA:  Weakness, losing strength and mobility slowly over several weeks now, sharper decline last week EXAM: PORTABLE CHEST 1 VIEW COMPARISON:  Portable exam 0904 hours compared to 07/16/2019 FINDINGS: Enlargement of cardiac silhouette with pulmonary vascular congestion. Atherosclerotic calcification aorta. BILATERAL interstitial infiltrates consistent with pulmonary edema and CHF. LEFT pleural effusion and basilar atelectasis. No pneumothorax. Bones demineralized with advanced BILATERAL glenohumeral degenerative changes and associated calcified loose bodies. IMPRESSION: CHF with small LEFT pleural effusion. Electronically Signed   By: Lavonia Dana M.D.   On: 01/01/2023 09:49     TODAY-DAY OF  DISCHARGE:  Subjective:   Hosie Poisson today has no headache,no chest abdominal pain,no new weakness tingling or numbness  Objective:   Blood pressure (!) 143/51, pulse 98, temperature 98.1 F (36.7 C), temperature source Oral, resp. rate (!) 24, weight 75.2 kg, SpO2 100 %.  Intake/Output Summary (Last 24 hours) at 01/22/2023 1440 Last data filed at 01/22/2023 0926 Gross per 24 hour  Intake 240 ml  Output 400 ml  Net -160 ml   Filed Weights   01/19/23 0500 01/20/23 0426 01/21/23 0500  Weight: 84.5 kg 75.8 kg 75.2 kg    Exam: Awake Alert, Oriented *3, No new F.N deficits, Normal affect [place].AT,PERRAL Supple Neck,No JVD, No cervical lymphadenopathy appriciated.  Symmetrical Chest wall movement, Good air movement bilaterally, CTAB RRR,No Gallops,Rubs or new Murmurs, No Parasternal Heave +ve B.Sounds, Abd Soft, Non tender, No organomegaly appriciated, No rebound -guarding or rigidity. No Cyanosis, Clubbing or edema, No new Rash or bruise   PERTINENT RADIOLOGIC STUDIES: No results found.   PERTINENT LAB RESULTS: CBC: No results for input(s): "WBC", "HGB", "HCT", "PLT" in the last 72 hours. CMET CMP     Component Value Date/Time   NA 139 01/20/2023 0707   NA 137 10/02/2022 0856   K 3.6 01/20/2023 0707   CL 97 (L) 01/20/2023 0707   CO2 32 01/20/2023 0707   GLUCOSE 115 (H) 01/20/2023 0707   BUN 44 (H) 01/20/2023 0707   BUN 25 10/02/2022 0856   CREATININE 1.48 (H) 01/20/2023 0707   CREATININE 0.69 10/31/2016 1453   CALCIUM 8.3 (L) 01/20/2023 0707   PROT 5.6 (L) 01/17/2023 0338   PROT 6.7 10/02/2022 0856   ALBUMIN 2.8 (L) 01/17/2023 0338   ALBUMIN 3.9 10/02/2022 0856   AST 15 01/17/2023 0338   ALT 21 01/17/2023 0338   ALKPHOS 62 01/17/2023 0338   BILITOT 0.4 01/17/2023 0338   BILITOT 0.6 10/02/2022 0856   GFRNONAA 33 (L) 01/20/2023 0707   GFRAA 59 (L) 06/17/2020 1952    GFR Estimated Creatinine Clearance: 23.5 mL/min (A) (by C-G formula based on SCr of 1.48 mg/dL  (H)). No results for input(s): "LIPASE", "AMYLASE" in the last 72 hours. No results for input(s): "CKTOTAL", "CKMB", "CKMBINDEX", "TROPONINI" in the last 72 hours. Invalid input(s): "POCBNP" No results for input(s): "DDIMER" in the last 72 hours. No results for  input(s): "HGBA1C" in the last 72 hours. No results for input(s): "CHOL", "HDL", "LDLCALC", "TRIG", "CHOLHDL", "LDLDIRECT" in the last 72 hours. No results for input(s): "TSH", "T4TOTAL", "T3FREE", "THYROIDAB" in the last 72 hours.  Invalid input(s): "FREET3" No results for input(s): "VITAMINB12", "FOLATE", "FERRITIN", "TIBC", "IRON", "RETICCTPCT" in the last 72 hours. Coags: No results for input(s): "INR" in the last 72 hours.  Invalid input(s): "PT" Microbiology: Recent Results (from the past 240 hour(s))  Blood Culture (routine x 2)     Status: None   Collection Time: 01/16/23  6:39 PM   Specimen: BLOOD RIGHT FOREARM  Result Value Ref Range Status   Specimen Description BLOOD RIGHT FOREARM  Final   Special Requests   Final    BOTTLES DRAWN AEROBIC AND ANAEROBIC Blood Culture adequate volume   Culture   Final    NO GROWTH 5 DAYS Performed at [facility], [address], [place], [place] [address]    Report Status 01/21/2023 FINAL  Final  Blood Culture (routine x 2)     Status: None   Collection Time: 01/16/23  6:42 PM   Specimen: BLOOD LEFT ARM  Result Value Ref Range Status   Specimen Description BLOOD LEFT ARM  Final   Special Requests   Final    BOTTLES DRAWN AEROBIC AND ANAEROBIC Blood Culture results may not be optimal due to an inadequate volume of blood received in culture bottles   Culture   Final    NO GROWTH 5 DAYS Performed at [facility], [place] [address], [place], [place] [address]    Report Status 01/21/2023 FINAL  Final    FURTHER DISCHARGE INSTRUCTIONS:  Get Medicines reviewed and adjusted: Please take all your medications with you for your next visit with your Primary  MD  Laboratory/radiological data: Please request your Primary MD to go over all hospital tests and procedure/radiological results at the follow up, please ask your Primary MD to get all Hospital records sent to his/her office.  In some cases, they will be blood work, cultures and biopsy results pending at the time of your discharge. Please request that your primary care M.D. goes through all the records of your hospital data and follows up on these results.  Also Note the following: If you experience worsening of your admission symptoms, develop shortness of breath, life threatening emergency, suicidal or homicidal thoughts you must seek medical attention immediately by calling 911 or calling your MD immediately  if symptoms less severe.  You must read complete instructions/literature along with all the possible adverse reactions/side effects for all the Medicines you take and that have been prescribed to you. Take any new Medicines after you have completely understood and accpet all the possible adverse reactions/side effects.   Do not drive when taking Pain medications or sleeping medications (Benzodaizepines)  Do not take more than prescribed Pain, Sleep and Anxiety Medications. It is not advisable to combine anxiety,sleep and pain medications without talking with your primary care practitioner  Special Instructions: If you have smoked or chewed Tobacco  in the last 2 yrs please stop smoking, stop any regular Alcohol  and or any Recreational drug use.  Wear Seat belts while driving.  Please note: You were cared for by a hospitalist during your hospital stay. Once you are discharged, your primary care physician will handle any further medical issues. Please note that NO REFILLS for any discharge medications will be authorized once you are discharged, as it is imperative that  you return to your primary care physician (or establish a relationship with a primary care physician if you do not have  one) for your post hospital discharge needs so that they can reassess your need for medications and monitor your lab values.  Total Time spent coordinating discharge including counseling, education and face to face time equals greater than 30 minutes.  SignedOren Binet 01/22/2023 2:40 PM

## 2023-01-22 NOTE — TOC Transition Note (Signed)
Transition of Care [facility]) - CM/SW Discharge Note   Patient Details  Name: Dawn Lawson MRN: [id number] Date of Birth: 11/28/1928  Transition of Care [facility]) CM/SW Contact:  Benard Halsted, LCSW Phone Number: 01/22/2023, 4:29 PM   Clinical Narrative:    Patient will DC to: [facility] Anticipated DC date: 01/22/23 Family notified: Son, Kasandra Knudsen Transport by: Corey Harold 8:30pm   Per MD patient ready for DC to [facility]. RN to call report prior to discharge [phone]). RN, patient, patient's family, and facility notified of DC. Discharge Summary sent to facility. DC packet on chart including signed DNR. Ambulance transport requested for patient.   CSW will sign off for now as social work intervention is no longer needed. Please consult [place] again if new needs arise.     Final next level of care: [place] Barriers to Discharge: Barriers Resolved   Patient Goals and CMS Choice CMS Medicare.gov Compare Post Acute Care list provided to:: Patient Represenative (must comment) Choice offered to / list presented to : Adult Children  Discharge Placement                Patient chooses bed at:  [facility]) Patient to be transferred to facility by: [place] Name of family member notified: Son, Kasandra Knudsen Patient and family notified of of transfer: 01/22/23  Discharge Plan and Services Additional resources added to the After Visit Summary for   In-house Referral: Clinical Social Work   Post Acute Care Choice: Residential Hospice Bed                               Social Determinants of Health ([place]) Interventions SDOH Screenings   Food Insecurity: No Food Insecurity (01/01/2023)  Housing: Low Risk  (01/01/2023)  Transportation Needs: No Transportation Needs (01/01/2023)  Utilities: Not At Risk (01/01/2023)  Alcohol Screen: Low Risk  (10/12/2022)  Depression (PHQ2-9): Low Risk  (11/27/2022)  Financial Resource Strain: Low Risk  (10/12/2022)  Physical  Activity: Sufficiently Active (10/12/2022)  Social Connections: Socially Isolated (10/12/2022)  Stress: No Stress Concern Present (10/12/2022)  Tobacco Use: Medium Risk (01/01/2023)     Readmission Risk Interventions     No data to display

## 2023-01-22 NOTE — Progress Notes (Signed)
PROGRESS NOTE        PATIENT DETAILS Name: Dawn Lawson Age: 89 y.o. Sex: female Date of Birth: 1929-02-02 Admit Date: 01/16/2023 Admitting Physician Kayleen Memos, DO [id number], [name], NP  Brief Summary: Patient is a 89 y.o.  female with history of chronic HFpEF, atrial fibrillation-not on anticoagulation due to recent left thigh hematoma, HTN, history of prior right knee prosthetic infection, chronic disability-just to SNF-presented to hospital with shortness of breath-found to have acute hypoxic respiratory failure due to combination of aspiration pneumonia and acute on chronic HFrEF.  See below for further details.  Significant events: 3/11-3/18>> hospitalization for HFrEF/respiratory failure-left thigh hematoma. 3/26>> admit to [facility]-severe hypoxia requiring BiPAP-aspiration PNA/HFpEF exacerbation. 3/27>> liberated off BiPAP 3/27>> on 2 L of [place]  Significant studies: 3/26>> CXR: Left lung consolidation/moderate pleural effusion.  Significant microbiology data: 3/26>> blood culture: No growth  Procedures: None  Consults: None  Subjective: No major issues over night-intermittent confusion continues.  Per son-poor oral intake over the past several days.  Objective: Vitals: Blood pressure (!) 151/55, pulse 93, temperature 98.6 F (37 C), temperature source Oral, resp. rate (!) 21, weight 75.2 kg, SpO2 97 %.   Exam: Gen Exam:Alert awake-not in any distress HEENT:atraumatic, normocephalic Chest: B/L clear to auscultation anteriorly CVS:S1S2 regular Abdomen:soft non tender, non distended Extremities: Trace edema Neurology: Non focal Skin: no rash  Pertinent Labs/Radiology:    Latest Ref Rng & Units 01/17/2023    3:38 AM 01/16/2023    6:47 PM 01/16/2023    6:39 PM  CBC  WBC 4.0 - 10.5 K/uL 9.5   13.1   Hemoglobin 12.0 - 15.0 g/dL 9.0  10.2  9.3   Hematocrit 36.0 - 46.0 % 28.2  30.0  29.9   Platelets 150 - 400 K/uL 253   317     Lab  Results  Component Value Date   NA 139 01/20/2023   K 3.6 01/20/2023   CL 97 (L) 01/20/2023   CO2 32 01/20/2023      Assessment/Plan: Acute hypoxic respiratory failure likely due to aspiration pneumonia and HFpEF exacerbation Suspect this was more from HFpEF exacerbation then aspiration pneumonia Significantly better after aggressive diuresis and IV antibiotics Initially required BiPAP but now stable on just 2-3 L of oxygen.  Aspiration pneumonia Respiratory status much better IV antibiotics x 5 days completed on 3/30 Cultures negative s Appreciate SLP input-tolerating diet without any major issues.  However oral intake very poor over the past several days.  See below.  Acute on chronic HFpEF Significant improvement in volume status I/os inaccurate-weight continues to improve (186 pounds on admission) Renal function stable Continue oral diuretics  Filed Weights   01/19/23 0500 01/20/23 0426 01/21/23 0500  Weight: 84.5 kg 75.8 kg 75.2 kg     Left-sided pleural effusion Chronic issue-seen in prior imaging studies as well I suspect this is from CHF-likely a transudate Do not think there is any benefit from pursuing thoracocentesis at this point-her hypoxia has improved-she is frail/elderly-and thoracocentesis is likely not going to provide any therapeutic benefit.  No clinical indication that this is a empyema or from exudative etiology.   Persistent atrial fibrillation Rate controlled without the use of any medications Not a candidate for anticoagulation-due to advanced age-and due to recent left thigh hematoma  Left thigh hematoma Stable left leg exam from prior  hospitalization-some ecchymosis still persist Supportive care at this point.  HTN All antihypertensives were discontinued last hospitalization BP appears reasonable-will follow closely.  Normocytic anemia Due to acute illness/recent left thigh hematoma Follow Hb periodically.  CKD stage IIIb Relatively  stable-close to baseline.  Failure to thrive syndrome Palliative care Frail elderly patient-now with aspiration pneumonia and resultant severe hypoxic respiratory failure.  Although much improved compared to on admission-improvement has plateaued over the past several days-appears frail/weak/intermittent confusion-poor oral intake.  Overall prognosis is poor-suspect best served by initiation of hospice care at some point.  No role in any further escalation of care at this point. Son to discuss options with social work-really  Suspect that if we transition to hospice care-and she continues to have poor oral intake-her life expectancy could be potentially less than a few weeks.    Obesity: Estimated body mass index is 30.32 kg/m as calculated from the following:   Height as of 01/01/23: 5\' 2"  (1.575 m).   Weight as of this encounter: 75.2 kg.   Code status:   Code Status: DNR   DVT Prophylaxis: SCDs Start: 01/17/23 0009-not on pharmacological prophylaxis given recent thigh hematoma.   Family Communication: Son at bedside on 4/1   Disposition Plan: Status is: Inpatient Remains inpatient appropriate because: Severity of illness   Planned Discharge Destination:Skilled nursing facility vs Hospice   Diet: Diet Order             Diet regular Room service appropriate? No; Fluid consistency: Thin; Fluid restriction: 1500 mL Fluid  Diet effective now                     Antimicrobial agents: Anti-infectives (From admission, onward)    Start     Dose/Rate Route Frequency Ordered Stop   01/18/23 1430  azithromycin (ZITHROMAX) tablet 500 mg        500 mg Oral Daily 01/18/23 1331 01/20/23 0943   01/17/23 1800  cefTRIAXone (ROCEPHIN) 2 g in sodium chloride 0.9 % 100 mL IVPB        2 g 200 mL/hr over 30 Minutes Intravenous Every 24 hours 01/16/23 2109 01/21/23 1839   01/17/23 1800  azithromycin (ZITHROMAX) 500 mg in sodium chloride 0.9 % 250 mL IVPB  Status:  Discontinued         500 mg 250 mL/hr  Intravenous Every 24 hours 01/16/23 2109 01/18/23 1331   01/16/23 1830  cefTRIAXone (ROCEPHIN) 1 g in sodium chloride 0.9 % 100 mL IVPB        1 g 200 mL/hr over 30 Minutes Intravenous  Once 01/16/23 1826 01/16/23 1930   01/16/23 1830  azithromycin (ZITHROMAX) 500 mg in sodium chloride 0.9 % 250 mL IVPB        500 mg 250 mL/hr over 60 Minutes Intravenous  Once 01/16/23 1826 01/16/23 2020        MEDICATIONS: Scheduled Meds:  furosemide  40 mg Oral Daily   guaiFENesin  600 mg Oral BID   potassium chloride  20 mEq Oral Daily   Continuous Infusions:   PRN Meds:.acetaminophen, ipratropium-albuterol, phenylephrine, polyethylene glycol, prochlorperazine, sodium chloride, traZODone, white petrolatum   I have personally reviewed following labs and imaging studies  LABORATORY DATA: CBC: Recent Labs  Lab 01/16/23 1839 01/16/23 1847 01/17/23 0338  WBC 13.1*  --  9.5  NEUTROABS 11.2*  --   --   HGB 9.3* 10.2* 9.0*  HCT 29.9* 30.0* 28.2*  MCV 92.0  --  91.0  PLT 317  --  253     Basic Metabolic Panel: Recent Labs  Lab 01/16/23 1839 01/16/23 1847 01/17/23 0338 01/18/23 0433 01/19/23 0702 01/20/23 0707  NA 138 135 138 140 137 139  K 4.0 4.1 4.1 4.3 3.6 3.6  CL 95*  --  96* 96* 97* 97*  CO2 30  --  28 29 28  32  GLUCOSE 155*  --  155* 97 114* 115*  BUN 44*  --  48* 53* 51* 44*  CREATININE 1.18*  --  1.16* 1.42* 1.58* 1.48*  CALCIUM 8.7*  --  8.3* 8.3* 8.1* 8.3*  MG  --   --  2.7*  --   --   --   PHOS  --   --  4.9*  --   --   --      GFR: Estimated Creatinine Clearance: 23.5 mL/min (A) (by C-G formula based on SCr of 1.48 mg/dL (H)).  Liver Function Tests: Recent Labs  Lab 01/16/23 1839 01/17/23 0338  AST 20 15  ALT 22 21  ALKPHOS 72 62  BILITOT 1.3* 0.4  PROT 6.2* 5.6*  ALBUMIN 3.1* 2.8*    No results for input(s): "LIPASE", "AMYLASE" in the last 168 hours. No results for input(s): "AMMONIA" in the last 168 hours.  Coagulation  Profile: Recent Labs  Lab 01/16/23 1839  INR 1.2     Cardiac Enzymes: No results for input(s): "CKTOTAL", "CKMB", "CKMBINDEX", "TROPONINI" in the last 168 hours.  BNP (last 3 results) No results for input(s): "PROBNP" in the last 8760 hours.  Lipid Profile: No results for input(s): "CHOL", "HDL", "LDLCALC", "TRIG", "CHOLHDL", "LDLDIRECT" in the last 72 hours.  Thyroid Function Tests: No results for input(s): "TSH", "T4TOTAL", "FREET4", "T3FREE", "THYROIDAB" in the last 72 hours.  Anemia Panel: No results for input(s): "VITAMINB12", "FOLATE", "FERRITIN", "TIBC", "IRON", "RETICCTPCT" in the last 72 hours.  Urine analysis:    Component Value Date/Time   COLORURINE AMBER (A) 01/16/2023 1826   APPEARANCEUR CLOUDY (A) 01/16/2023 1826   LABSPEC 1.017 01/16/2023 1826   PHURINE 5.0 01/16/2023 1826   GLUCOSEU NEGATIVE 01/16/2023 1826   HGBUR SMALL (A) 01/16/2023 1826   BILIRUBINUR NEGATIVE 01/16/2023 1826   KETONESUR NEGATIVE 01/16/2023 1826   PROTEINUR 30 (A) 01/16/2023 1826   UROBILINOGEN 0.2 03/22/2014 1944   NITRITE NEGATIVE 01/16/2023 1826   LEUKOCYTESUR LARGE (A) 01/16/2023 1826    Sepsis Labs: Lactic Acid, Venous    Component Value Date/Time   LATICACIDVEN 1.0 01/16/2023 1839    MICROBIOLOGY: Recent Results (from the past 240 hour(s))  Blood Culture (routine x 2)     Status: None   Collection Time: 01/16/23  6:39 PM   Specimen: BLOOD RIGHT FOREARM  Result Value Ref Range Status   Specimen Description BLOOD RIGHT FOREARM  Final   Special Requests   Final    BOTTLES DRAWN AEROBIC AND ANAEROBIC Blood Culture adequate volume   Culture   Final    NO GROWTH 5 DAYS Performed at [facility], [address], [place], [place] [address]    Report Status 01/21/2023 FINAL  Final  Blood Culture (routine x 2)     Status: None   Collection Time: 01/16/23  6:42 PM   Specimen: BLOOD LEFT ARM  Result Value Ref Range Status   Specimen Description BLOOD LEFT ARM  Final    Special Requests   Final    BOTTLES DRAWN AEROBIC AND ANAEROBIC Blood Culture results may not be optimal due  to an inadequate volume of blood received in culture bottles   Culture   Final    NO GROWTH 5 DAYS Performed at [facility], [place] [address], [place], [place] [address]    Report Status 01/21/2023 FINAL  Final    RADIOLOGY STUDIES/RESULTS: No results found.   LOS: 6 days   Oren Binet, MD  [facility]    To contact the attending provider between 7A-7P or the covering provider during after hours 7P-7A, please log into the web site [email or web address] and access using universal [place] password for that web site. If you do not have the password, please call the hospital operator.  01/22/2023, 10:55 AM

## 2023-01-22 NOTE — Progress Notes (Signed)
Occupational Therapy Treatment Patient Details Name: Dawn Lawson MRN: [id number] DOB: 1928-12-27 Today's Date: 01/22/2023   History of present illness 89 yo female diagnosed with acute CHF and new onset of A-fib presenting with acute respiratory distress with a progressive cough on 3/26. PMH HLD HTN obesity arthritis HRpEF with grade 2 diastolic dysfunction cataract, R TKA   OT comments  Patient received in supine and agreeable to OT session Patient required max assist to get to EOB and initially was supervision for sitting balance and required mod to max assist for balance as patient fatigued and tolerated 5 minutes of sitting EOB before asking to return to supine. Grooming and UB ROM performed at bed level. Patient to continue to be followed by acute OT to address self care and activity tolerance.    Recommendations for follow up therapy are one component of a multi-disciplinary discharge planning process, led by the attending physician.  Recommendations may be updated based on patient status, additional functional criteria and insurance authorization.    Assistance Recommended at Discharge Frequent or constant Supervision/Assistance  Patient can return home with the following  Two people to help with walking and/or transfers;Assistance with cooking/housework;Direct supervision/assist for medications management;Assist for transportation;Direct supervision/assist for financial management;Help with stairs or ramp for entrance;Two people to help with bathing/dressing/bathroom   Equipment Recommendations  BSC/3in1;Hospital bed;Wheelchair cushion (measurements OT);Wheelchair (measurements OT);Other (comment)    Recommendations for Other Services      Precautions / Restrictions Precautions Precautions: Fall Precaution Comments: right knee fused, watch O2 Restrictions Weight Bearing Restrictions: No       Mobility Bed Mobility Overal bed mobility: Needs Assistance Bed Mobility: Supine  to Sit, Sit to Supine     Supine to sit: Max assist, HOB elevated Sit to supine: Max assist, HOB elevated   General bed mobility comments: cues to assist using bed rail and HOB elevated with patient requiring assistance for BLEs and trunk    Transfers Overall transfer level: Needs assistance                 General transfer comment: deferred as pt unable to tolerate sitting EOB     Balance Overall balance assessment: Needs assistance, History of Falls Sitting-balance support: Bilateral upper extremity supported, Feet unsupported Sitting balance-Leahy Scale: Poor Sitting balance - Comments: tolerated 1-2 minutes of no assistance for balance and requiring increased assistance due to posterior leaning and right lateral leaning. Patient toleranted 5 minutes on EOB Postural control: Posterior lean                                 ADL either performed or assessed with clinical judgement   ADL Overall ADL's : Needs assistance/impaired     Grooming: Wash/dry hands;Wash/dry face;Minimal assistance Grooming Details (indicate cue type and reason): declined performing on EOB, performed at bedlevel                               General ADL Comments: bed level for adls.    Extremity/Trunk Assessment              Vision       Perception     Praxis      Cognition Arousal/Alertness: Awake/alert Behavior During Therapy: WFL for tasks assessed/performed Overall Cognitive Status: History of cognitive impairments - at baseline  General Comments: self limiting, increased time to follow directions        Exercises Exercises: General Upper Extremity General Exercises - Upper Extremity Shoulder Flexion: AROM, Both, 10 reps, Supine Shoulder ABduction: AROM, Both, 10 reps, Supine    Shoulder Instructions       General Comments      Pertinent Vitals/ Pain       Pain Assessment Pain Assessment:  Faces Faces Pain Scale: No hurt Pain Intervention(s): Monitored during session  Home Living                                          Prior Functioning/Environment              Frequency  Min 2X/week        Progress Toward Goals  OT Goals(current goals can now be found in the care plan section)  Progress towards OT goals: Progressing toward goals  Acute Rehab OT Goals Patient Stated Goal: get better OT Goal Formulation: With patient/family Time For Goal Achievement: 01/31/23 Potential to Achieve Goals: Fair ADL Goals Pt Will Perform Grooming: with supervision;sitting Pt Will Perform Upper Body Bathing: with min guard assist;sitting Pt Will Transfer to Toilet: with max assist;squat pivot transfer;bedside commode Additional ADL Goal #1: pt will tolerate sitting in chair for 5 minutes with stable VSS Additional ADL Goal #2: pt will roll R and L for hoyer pad placement mod (A) and precursor to adl needs for hygiene Additional ADL Goal #3: pt will demonstrate grooming sitting in chair mod (A)  Plan Discharge plan remains appropriate    Co-evaluation                 AM-PAC OT "6 Clicks" Daily Activity     Outcome Measure   Help from another person eating meals?: A Lot Help from another person taking care of personal grooming?: A Lot Help from another person toileting, which includes using toliet, bedpan, or urinal?: Total Help from another person bathing (including washing, rinsing, drying)?: Total Help from another person to put on and taking off regular upper body clothing?: A Lot Help from another person to put on and taking off regular lower body clothing?: Total 6 Click Score: 9    End of Session Equipment Utilized During Treatment: Oxygen  OT Visit Diagnosis: Muscle weakness (generalized) (M62.81)   Activity Tolerance Patient limited by fatigue   Patient Left in bed;with call bell/phone within reach;with family/visitor present;with  bed alarm set   Nurse Communication Mobility status        Time: [id number] OT Time Calculation (min): 18 min  Charges: OT General Charges $OT Visit: 1 Visit OT Treatments $Therapeutic Activity: 8-22 mins  Lodema Hong, OTA Acute Rehabilitation Services  Office [phone]   Trixie Dredge 01/22/2023, 1:04 PM

## 2023-01-22 NOTE — Progress Notes (Signed)
RT note. Patient not requiring bipap at this time, servo air removed at this time due to patient not requiring for the last three days. Patient currently on 2 L [place] sat 92% with stable VS, no labored breathing noted. RT will bring bipap if needed, RT will continue to monitor.

## 2023-01-22 NOTE — Progress Notes (Signed)
[place] 5W20 [facility]) Hospice hospital liaison note   Referral received for [facility].    Met with patient and family in room. Patien is approved for [employer] and we are able to accept this evening after 8:30pm.    RN staff, you may call report at any time to [phone], room is assigned when report is called.  Please leave IV intact.    Please send completed DNR with patient.   Updated attending and [facility] manager via [place]. Thank you for the opportunity to participate in this patient's care Jhonnie Garner, BSN, RN Hospice nurse liaison [phone]

## 2023-01-23 ENCOUNTER — Ambulatory Visit: Payer: Medicare Other | Admitting: Physician Assistant

## 2023-01-23 DIAGNOSIS — I5032 Chronic diastolic (congestive) heart failure: Secondary | ICD-10-CM

## 2023-01-23 DIAGNOSIS — I4819 Other persistent atrial fibrillation: Secondary | ICD-10-CM

## 2023-01-23 NOTE — Progress Notes (Signed)
Pt was picked up by ems placed on stretcher, v/s WNL, iv was left in the pt. Per Dr. Montine Circle to leave it in.

## 2023-02-21 DEATH — deceased

## 2023-04-16 ENCOUNTER — Ambulatory Visit: Payer: Medicare Other | Admitting: Nurse Practitioner
